# Patient Record
Sex: Female | Born: 1955
Health system: Southern US, Community
[De-identification: ages and names within clinical notes are randomized; demographics above are authoritative.]

## PROBLEM LIST (undated history)

## (undated) DIAGNOSIS — N390 Urinary tract infection, site not specified: Secondary | ICD-10-CM

## (undated) DIAGNOSIS — G039 Meningitis, unspecified: Secondary | ICD-10-CM

## (undated) DIAGNOSIS — F419 Anxiety disorder, unspecified: Secondary | ICD-10-CM

## (undated) DIAGNOSIS — M1990 Unspecified osteoarthritis, unspecified site: Secondary | ICD-10-CM

## (undated) DIAGNOSIS — F1910 Other psychoactive substance abuse, uncomplicated: Secondary | ICD-10-CM

## (undated) DIAGNOSIS — J189 Pneumonia, unspecified organism: Secondary | ICD-10-CM

## (undated) DIAGNOSIS — I10 Essential (primary) hypertension: Secondary | ICD-10-CM

## (undated) DIAGNOSIS — R569 Unspecified convulsions: Secondary | ICD-10-CM

## (undated) DIAGNOSIS — G4700 Insomnia, unspecified: Secondary | ICD-10-CM

## (undated) DIAGNOSIS — N189 Chronic kidney disease, unspecified: Secondary | ICD-10-CM

## (undated) DIAGNOSIS — K219 Gastro-esophageal reflux disease without esophagitis: Secondary | ICD-10-CM

## (undated) DIAGNOSIS — F32A Depression, unspecified: Secondary | ICD-10-CM

## (undated) DIAGNOSIS — R011 Cardiac murmur, unspecified: Secondary | ICD-10-CM

## (undated) DIAGNOSIS — F329 Major depressive disorder, single episode, unspecified: Secondary | ICD-10-CM

## (undated) HISTORY — PX: FRACTURE SURGERY: SHX138

## (undated) HISTORY — PX: SPINE SURGERY: SHX786

## (undated) HISTORY — PX: CERVICAL DISC SURGERY: SHX588

## (undated) HISTORY — PX: BACK SURGERY: SHX140

---

## 2018-04-08 NOTE — Congregational Nurse Program (Signed)
Counseling with [facility] staff

## 2018-04-08 NOTE — Congregational Nurse Program (Signed)
Client came for counseling services with [facility] staff.  States is interested in finding a provider that can also prescribe Methodone/saboxone.  Referred to [facility]

## 2018-04-16 NOTE — Congregational Nurse Program (Signed)
Client requesting assistance with locating a provider.  Client referred to [facility]

## 2018-05-13 ENCOUNTER — Other Ambulatory Visit: Payer: Self-pay | Admitting: Nurse Practitioner

## 2018-05-13 DIAGNOSIS — Z1231 Encounter for screening mammogram for malignant neoplasm of breast: Secondary | ICD-10-CM

## 2018-09-22 ENCOUNTER — Inpatient Hospital Stay (HOSPITAL_COMMUNITY)
Admission: EM | Admit: 2018-09-22 | Discharge: 2018-09-25 | DRG: 917 | Disposition: A | Discharge status: Home / Self Care | Payer: Medicare Other | Attending: Internal Medicine | Admitting: Internal Medicine

## 2018-09-22 ENCOUNTER — Other Ambulatory Visit: Payer: Self-pay

## 2018-09-22 ENCOUNTER — Encounter (HOSPITAL_COMMUNITY): Payer: Self-pay

## 2018-09-22 ENCOUNTER — Emergency Department (HOSPITAL_COMMUNITY): Payer: Medicare Other

## 2018-09-22 DIAGNOSIS — Y92009 Unspecified place in unspecified non-institutional (private) residence as the place of occurrence of the external cause: Secondary | ICD-10-CM

## 2018-09-22 DIAGNOSIS — T40601A Poisoning by unspecified narcotics, accidental (unintentional), initial encounter: Principal | ICD-10-CM | POA: Diagnosis present

## 2018-09-22 DIAGNOSIS — I10 Essential (primary) hypertension: Secondary | ICD-10-CM | POA: Diagnosis present

## 2018-09-22 DIAGNOSIS — F329 Major depressive disorder, single episode, unspecified: Secondary | ICD-10-CM | POA: Diagnosis present

## 2018-09-22 DIAGNOSIS — Z87891 Personal history of nicotine dependence: Secondary | ICD-10-CM

## 2018-09-22 DIAGNOSIS — Z79899 Other long term (current) drug therapy: Secondary | ICD-10-CM

## 2018-09-22 DIAGNOSIS — I951 Orthostatic hypotension: Secondary | ICD-10-CM

## 2018-09-22 DIAGNOSIS — G9341 Metabolic encephalopathy: Secondary | ICD-10-CM | POA: Diagnosis present

## 2018-09-22 DIAGNOSIS — F32A Depression, unspecified: Secondary | ICD-10-CM | POA: Diagnosis present

## 2018-09-22 DIAGNOSIS — R569 Unspecified convulsions: Secondary | ICD-10-CM | POA: Diagnosis present

## 2018-09-22 DIAGNOSIS — R339 Retention of urine, unspecified: Secondary | ICD-10-CM | POA: Diagnosis present

## 2018-09-22 DIAGNOSIS — G4700 Insomnia, unspecified: Secondary | ICD-10-CM | POA: Diagnosis present

## 2018-09-22 DIAGNOSIS — Z888 Allergy status to other drugs, medicaments and biological substances status: Secondary | ICD-10-CM

## 2018-09-22 DIAGNOSIS — R110 Nausea: Secondary | ICD-10-CM | POA: Diagnosis not present

## 2018-09-22 DIAGNOSIS — F419 Anxiety disorder, unspecified: Secondary | ICD-10-CM | POA: Diagnosis present

## 2018-09-22 DIAGNOSIS — E876 Hypokalemia: Secondary | ICD-10-CM | POA: Diagnosis not present

## 2018-09-22 DIAGNOSIS — Z880 Allergy status to penicillin: Secondary | ICD-10-CM

## 2018-09-22 DIAGNOSIS — G894 Chronic pain syndrome: Secondary | ICD-10-CM | POA: Diagnosis present

## 2018-09-22 HISTORY — DX: Insomnia, unspecified: G47.00

## 2018-09-22 HISTORY — DX: Unspecified convulsions: R56.9

## 2018-09-22 HISTORY — DX: Depression, unspecified: F32.A

## 2018-09-22 HISTORY — DX: Major depressive disorder, single episode, unspecified: F32.9

## 2018-09-22 HISTORY — DX: Anxiety disorder, unspecified: F41.9

## 2018-09-22 HISTORY — DX: Other psychoactive substance abuse, uncomplicated: F19.10

## 2018-09-22 HISTORY — DX: Meningitis, unspecified: G03.9

## 2018-09-22 HISTORY — DX: Essential (primary) hypertension: I10

## 2018-09-22 LAB — DIFFERENTIAL
Basophils Absolute: 0.1 10*3/uL (ref 0.0–0.1)
Basophils Relative: 1 %
Eosinophils Absolute: 0 10*3/uL (ref 0.0–0.5)
Eosinophils Relative: 0 %
Lymphocytes Relative: 14 %
Lymphs Abs: 1.3 10*3/uL (ref 0.7–4.0)
Monocytes Absolute: 0.9 10*3/uL (ref 0.1–1.0)
Monocytes Relative: 9 %
Neutro Abs: 7.4 10*3/uL (ref 1.7–7.7)
Neutrophils Relative %: 76 %

## 2018-09-22 LAB — BASIC METABOLIC PANEL
Anion gap: 8 (ref 5–15)
BUN: 13 mg/dL (ref 8–23)
CO2: 23 mmol/L (ref 22–32)
Calcium: 9.2 mg/dL (ref 8.9–10.3)
Chloride: 103 mmol/L (ref 98–111)
Creatinine, Ser: 0.87 mg/dL (ref 0.44–1.00)
GFR calc Af Amer: >60 mL/min (ref >60)
GFR calc non Af Amer: >60 mL/min (ref >60)
Glucose, Bld: 112 mg/dL — ABNORMAL HIGH (ref 70–99)
Potassium: 4.1 mmol/L (ref 3.5–5.1)
Sodium: 134 mmol/L — ABNORMAL LOW (ref 135–145)

## 2018-09-22 LAB — CBG MONITORING, ED: Glucose-Capillary: 108 mg/dL — ABNORMAL HIGH (ref 70–99)

## 2018-09-22 LAB — URINALYSIS, ROUTINE W REFLEX MICROSCOPIC
Bilirubin Urine: NEGATIVE
Glucose, UA: NEGATIVE mg/dL
Hgb urine dipstick: NEGATIVE
Ketones, ur: NEGATIVE mg/dL
Leukocytes,Ua: NEGATIVE
Nitrite: NEGATIVE
Protein, ur: NEGATIVE mg/dL
Specific Gravity, Urine: 1.01 (ref 1.005–1.030)
pH: 7 (ref 5.0–8.0)

## 2018-09-22 LAB — ETHANOL: Alcohol, Ethyl (B): <10 mg/dL (ref <10)

## 2018-09-22 LAB — RAPID URINE DRUG SCREEN, HOSP PERFORMED
Amphetamines: NOT DETECTED
BARBITURATES: NOT DETECTED
BENZODIAZEPINES: NOT DETECTED
Cocaine: NOT DETECTED
Opiates: POSITIVE — AB
Tetrahydrocannabinol: NOT DETECTED

## 2018-09-22 LAB — LACTIC ACID, PLASMA: Lactic Acid, Venous: 0.7 mmol/L (ref 0.5–1.9)

## 2018-09-22 LAB — CBC
HCT: 47.5 % — ABNORMAL HIGH (ref 36.0–46.0)
Hemoglobin: 15.5 g/dL — ABNORMAL HIGH (ref 12.0–15.0)
MCH: 29.2 pg (ref 26.0–34.0)
MCHC: 32.6 g/dL (ref 30.0–36.0)
MCV: 89.6 fL (ref 80.0–100.0)
Platelets: 284 10*3/uL (ref 150–400)
RBC: 5.3 MIL/uL — ABNORMAL HIGH (ref 3.87–5.11)
RDW: 12.4 % (ref 11.5–15.5)
WBC: 10.3 10*3/uL (ref 4.0–10.5)
nRBC: 0 % (ref 0.0–0.2)

## 2018-09-22 LAB — HEPATIC FUNCTION PANEL
ALT: 22 U/L (ref 0–44)
AST: 26 U/L (ref 15–41)
Albumin: 4.4 g/dL (ref 3.5–5.0)
Alkaline Phosphatase: 84 U/L (ref 38–126)
BILIRUBIN INDIRECT: 0.3 mg/dL (ref 0.3–0.9)
Bilirubin, Direct: 0.1 mg/dL (ref 0.0–0.2)
Total Bilirubin: 0.4 mg/dL (ref 0.3–1.2)
Total Protein: 8.2 g/dL — ABNORMAL HIGH (ref 6.5–8.1)

## 2018-09-22 LAB — TROPONIN I: Troponin I: <0.03 ng/mL (ref <0.03)

## 2018-09-22 IMAGING — CT CT HEAD W/O CM
3 series · 16 of 47 positions shown, 19 images · non-contrast
Comparison: None.

CLINICAL DATA: Generalized muscle weakness

EXAM:
CT HEAD WITHOUT CONTRAST
TECHNIQUE: Contiguous axial images were obtained from the base of the skull
through the vertex without intravenous contrast.

[Series 2: head wo · axial · 0.42mm/px · z∈[+1513,+1648]mm · 10 of 33 slices shown, 13 images]
[im 3/33  brain]
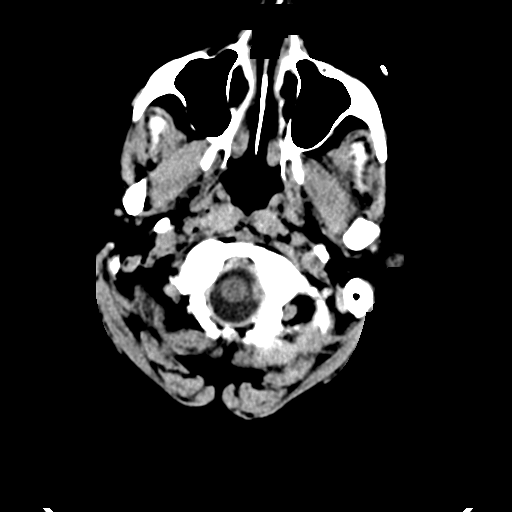
[im 3/33  bone]
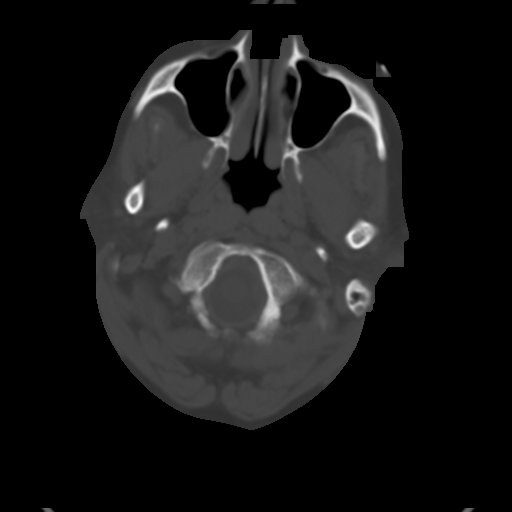
[im 6/33  brain]
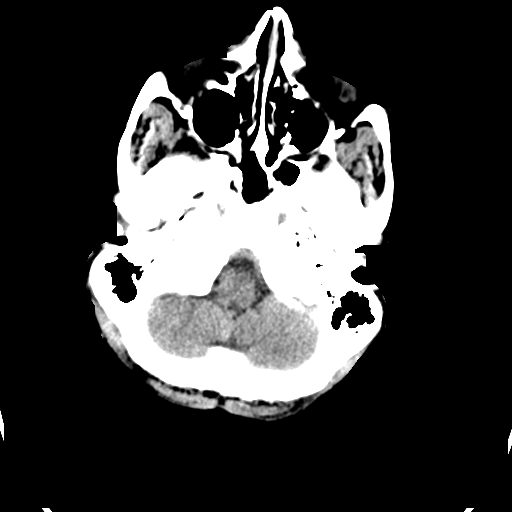
[im 9/33  brain]
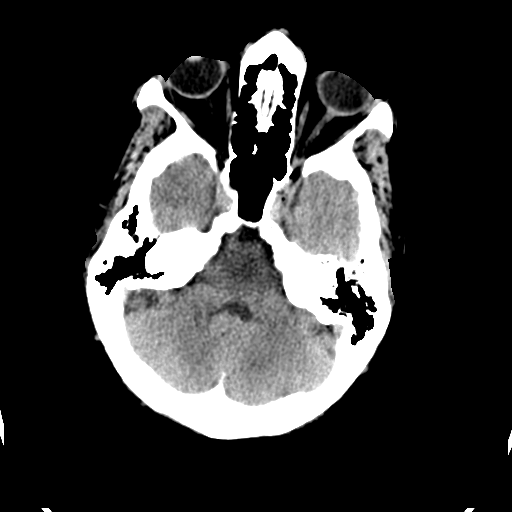
[im 12/33  brain]
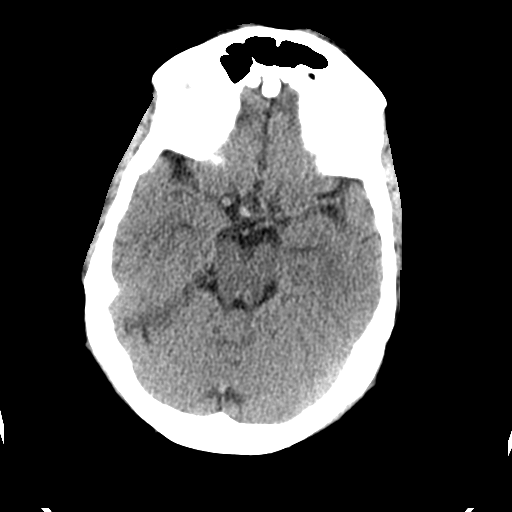
[im 15/33  brain]
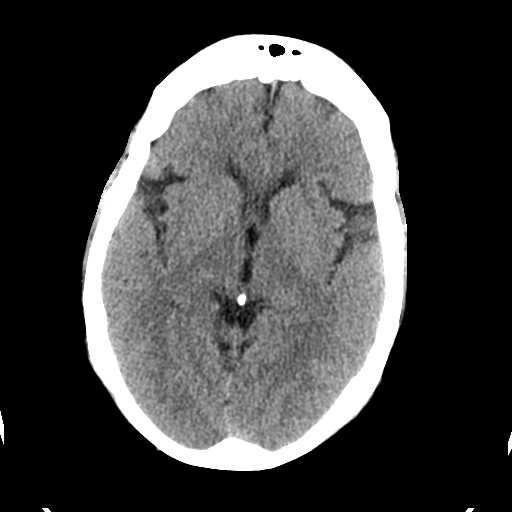
[im 15/33  bone]
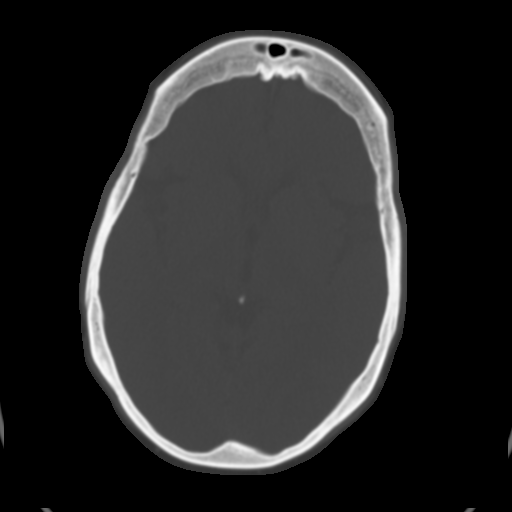
[im 18/33  brain]
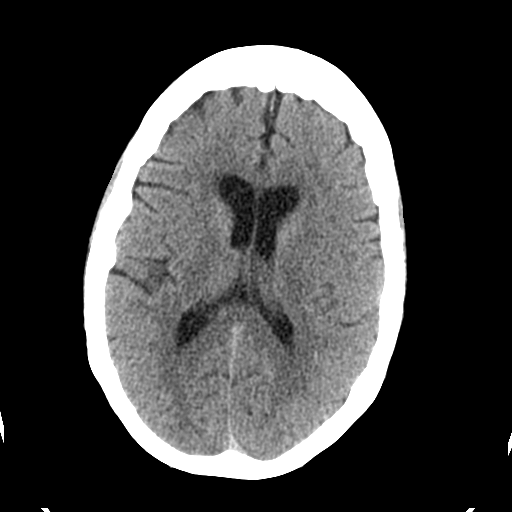
[im 21/33  brain]
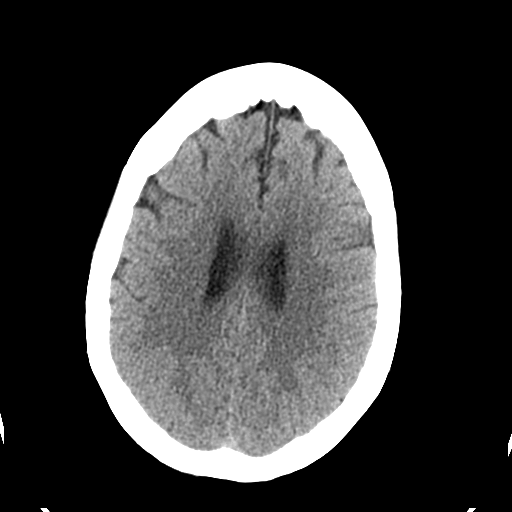
[im 25/33  brain]
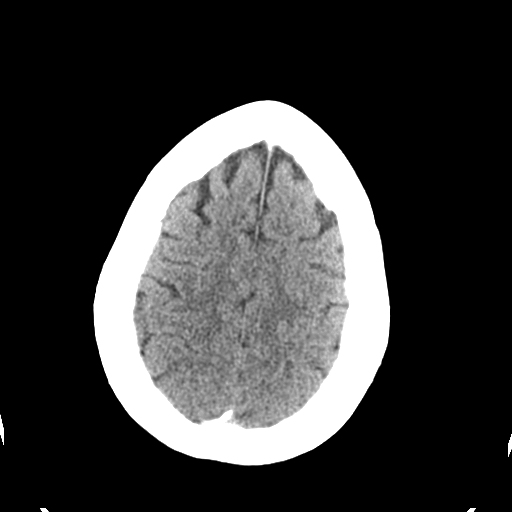
[im 27/33  brain]
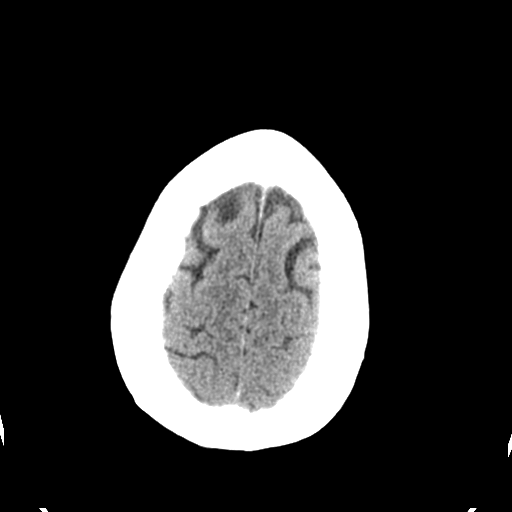
[im 27/33  bone]
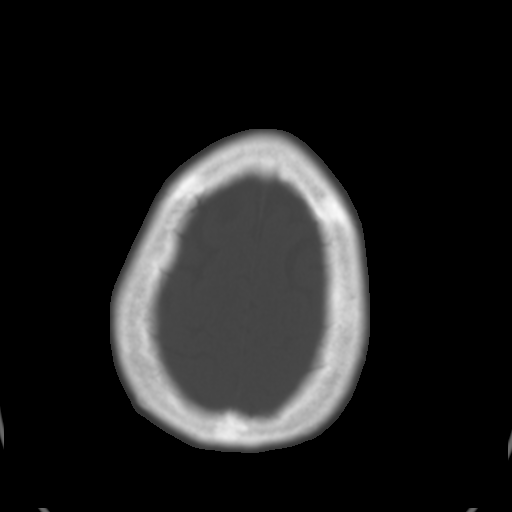
[im 30/33  brain]
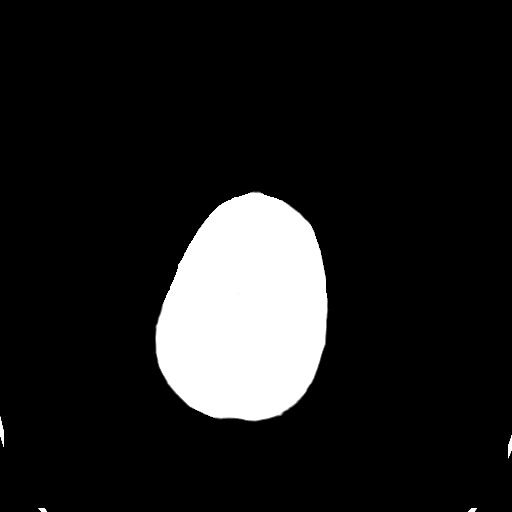

[Series 5: coronal soft tissue · coronal · 0.29mm/px · 3 of 66 slices shown]
[im 23/66  brain]
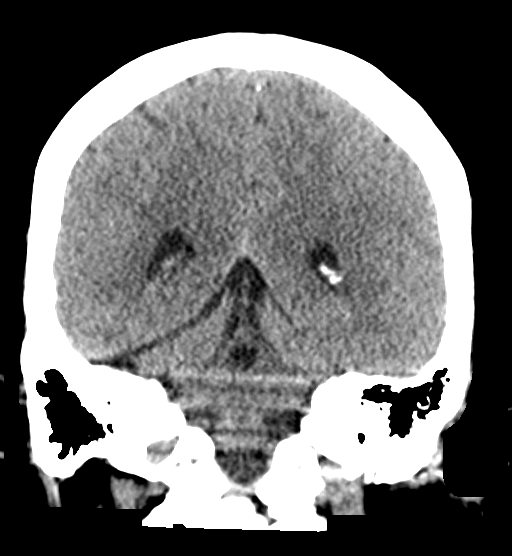
[im 30/66  brain]
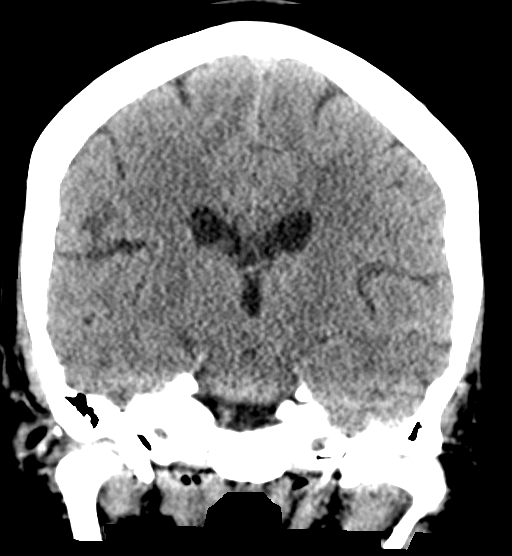
[im 36/66  brain]
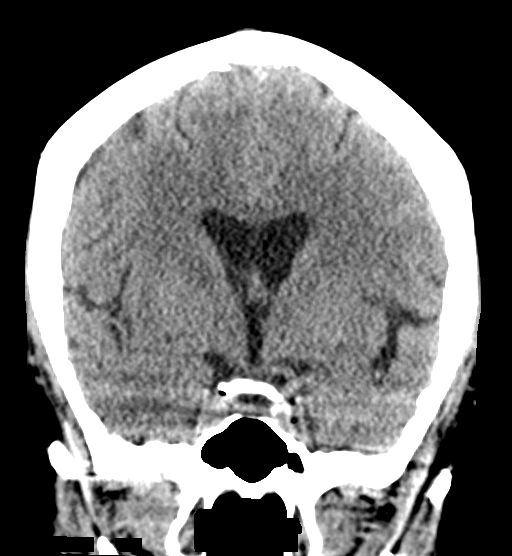

[Series 6: sagittal soft tissue · sagittal · 0.32mm/px · 3 of 51 slices shown]
[im 17/51  brain]
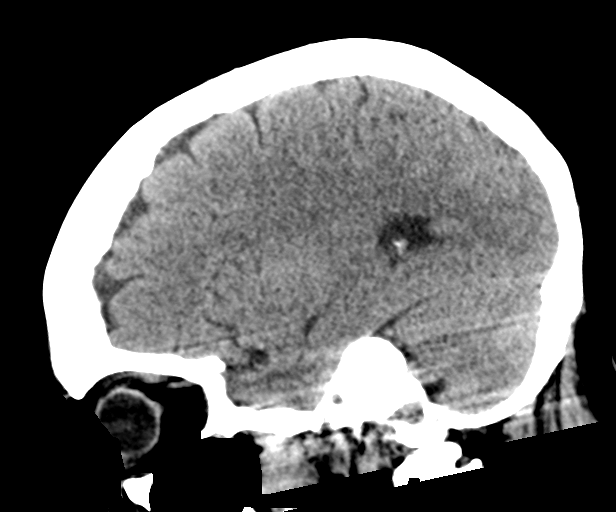
[im 26/51  brain]
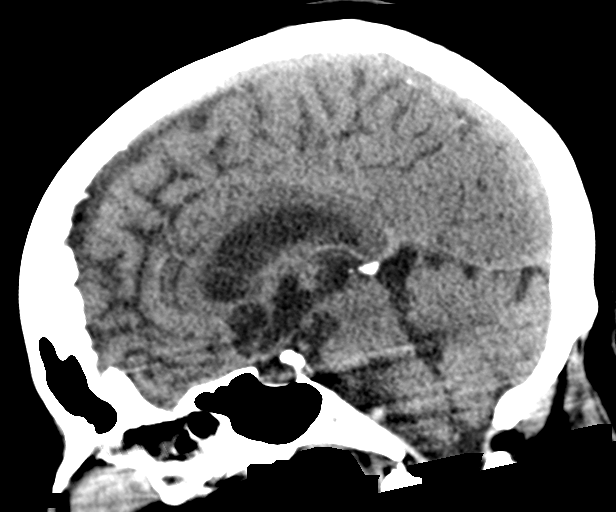
[im 34/51  brain]
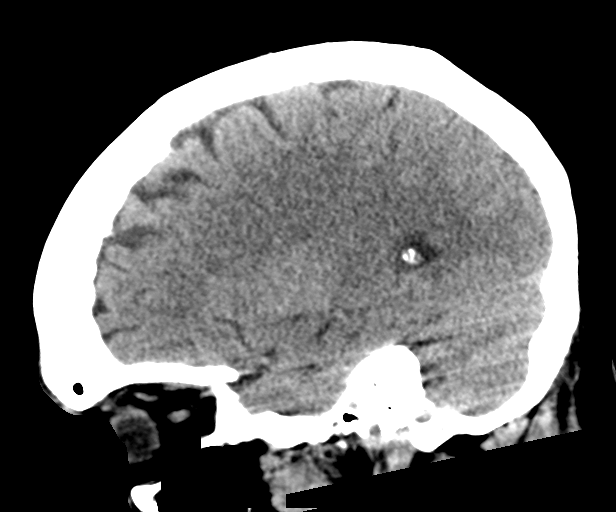

[16 of 47 positions shown; findings below may reference images not displayed]

FINDINGS: Brain: No acute territorial infarction, hemorrhage or intracranial
mass. The ventricles are nonenlarged. Minimal small vessel ischemic
changes of the white matter

Vascular: No hyperdense vessels. Tiny gas bubbles in the cavernous
sinus, which may be iatrogenic. Mild carotid vascular calcification.

Skull: Normal. Negative for fracture or focal lesion.

Sinuses/Orbits: No acute finding.

Other: None
IMPRESSION: 1. No CT evidence for acute intra-abdominal or pelvic abnormality.
2. Minimal small vessel ischemic change of the white matter

## 2018-09-22 IMAGING — CR DG CHEST 2V
2 series · 2 of 2 positions shown · non-contrast
Comparison: None.

CLINICAL DATA: Generalized weakness.  Unable to sleep.

EXAM:
CHEST - 2 VIEW

[w chest lat]
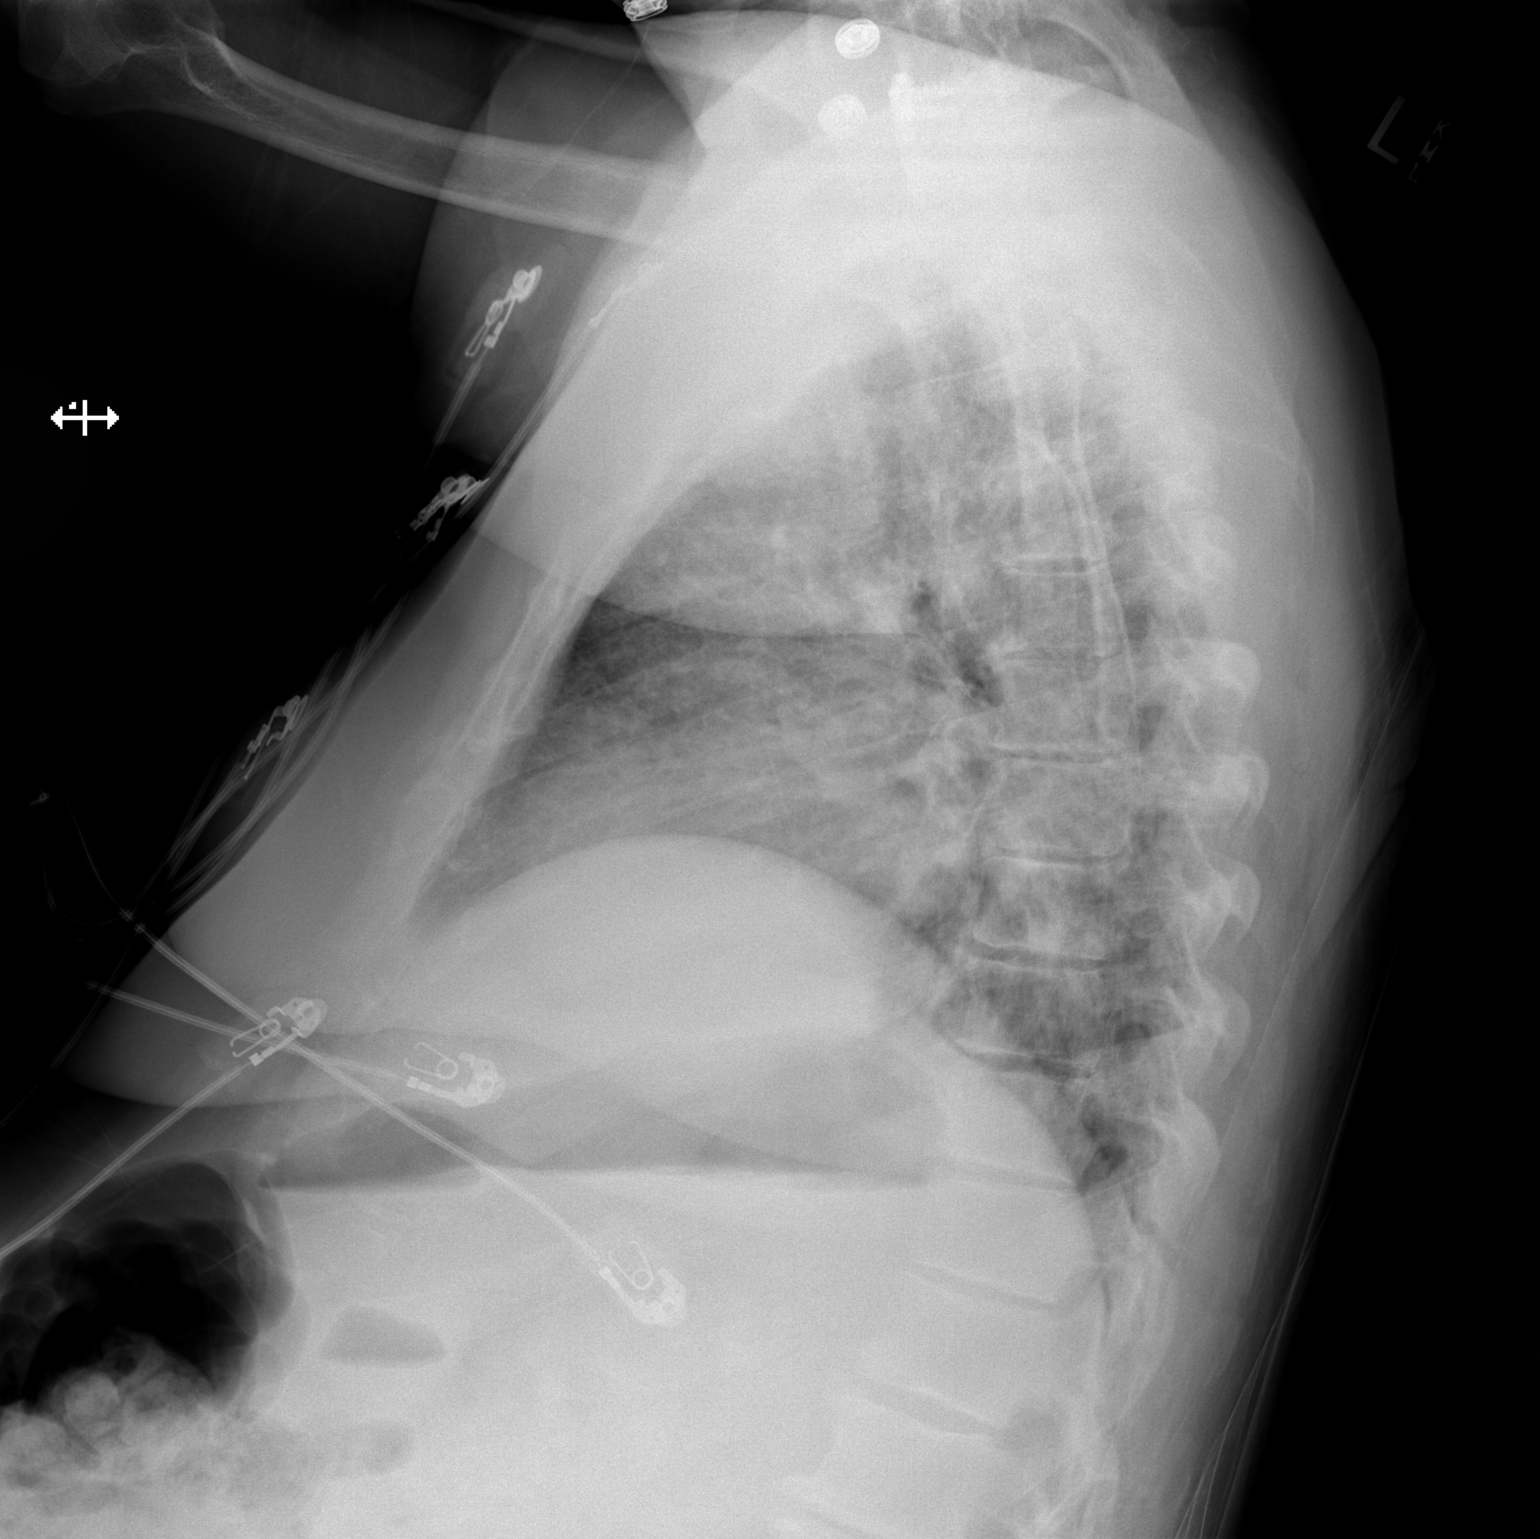

[x chest ap]
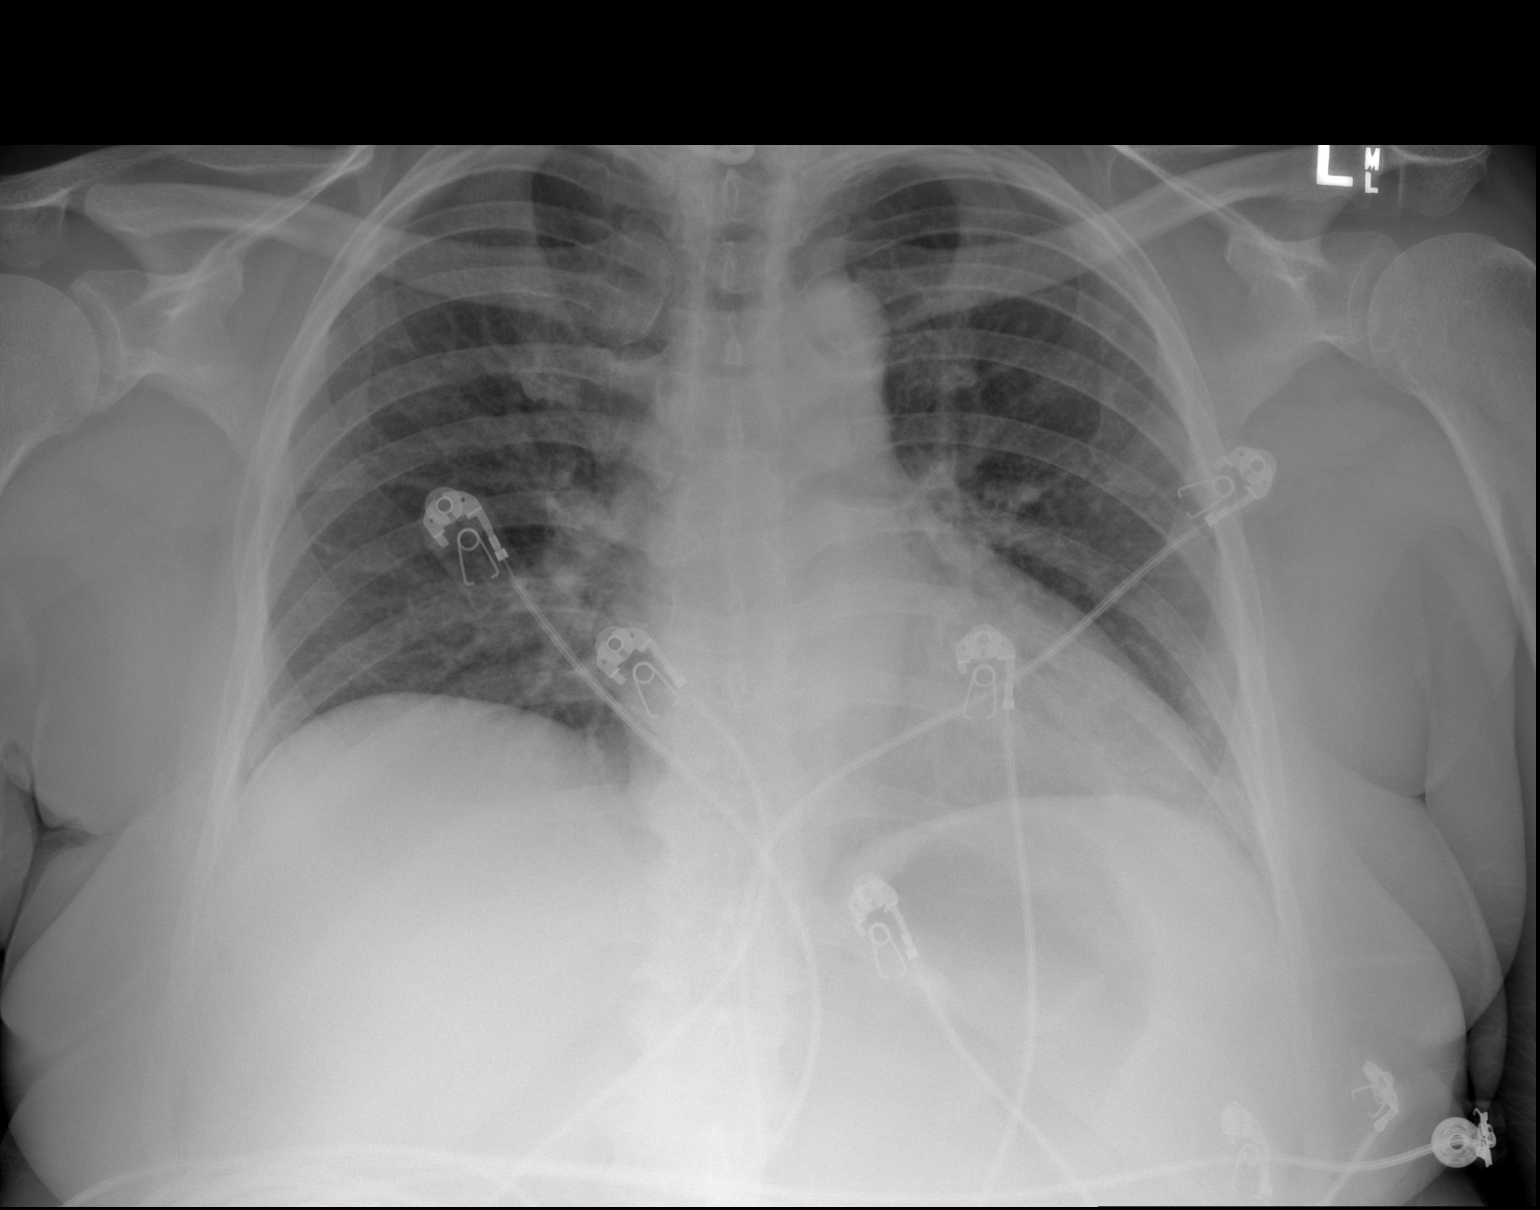

[2 of 2 positions shown; findings below may reference images not displayed]

FINDINGS: Heart size is exaggerated by low lung volumes. Mild pulmonary
vascular congestion is present. No focal airspace disease is
evident. The visualized soft tissues and bony thorax are
unremarkable.
IMPRESSION: Borderline cardiomegaly and mild pulmonary vascular congestion.

## 2018-09-22 MED ORDER — CLONAZEPAM 0.5 MG PO TABS
0.5000 mg | ORAL_TABLET | Freq: Two times a day (BID) | ORAL | Status: DC | PRN
  Administered 2018-09-24 – 2018-09-25 (×2): 0.5 mg via ORAL
  Filled 2018-09-22 (×3): qty 1

## 2018-09-22 MED ORDER — SODIUM CHLORIDE 0.9 % IV BOLUS
1000.0000 mL | Freq: Once | INTRAVENOUS | Status: AC
  Administered 2018-09-22: 1000 mL via INTRAVENOUS

## 2018-09-22 MED ORDER — SODIUM CHLORIDE 0.9% FLUSH
3.0000 mL | Freq: Once | INTRAVENOUS | Status: AC
  Administered 2018-09-22: 3 mL via INTRAVENOUS

## 2018-09-22 MED ORDER — ENOXAPARIN SODIUM 40 MG/0.4ML ~~LOC~~ SOLN
40.0000 mg | SUBCUTANEOUS | Status: DC
  Administered 2018-09-22 – 2018-09-24 (×3): 40 mg via SUBCUTANEOUS
  Filled 2018-09-22 (×4): qty 0.4

## 2018-09-22 MED ORDER — IBUPROFEN 200 MG PO TABS
200.0000 mg | ORAL_TABLET | Freq: Two times a day (BID) | ORAL | Status: DC | PRN
  Administered 2018-09-24: 200 mg via ORAL
  Filled 2018-09-22: qty 1

## 2018-09-22 MED ORDER — ACETAMINOPHEN 650 MG RE SUPP: 650.0000 mg | Freq: Four times a day (QID) | RECTAL | Status: DC | PRN

## 2018-09-22 MED ORDER — FLUOXETINE HCL 20 MG PO CAPS
40.0000 mg | ORAL_CAPSULE | Freq: Every day | ORAL | Status: DC
  Administered 2018-09-23 – 2018-09-25 (×3): 40 mg via ORAL
  Filled 2018-09-22 (×4): qty 2

## 2018-09-22 MED ORDER — GABAPENTIN 400 MG PO CAPS
800.0000 mg | ORAL_CAPSULE | Freq: Three times a day (TID) | ORAL | Status: DC
  Administered 2018-09-23 – 2018-09-25 (×7): 800 mg via ORAL
  Filled 2018-09-22 (×9): qty 2

## 2018-09-22 MED ORDER — TRAZODONE HCL 50 MG PO TABS
150.0000 mg | ORAL_TABLET | Freq: Every day | ORAL | Status: DC
  Administered 2018-09-22 – 2018-09-24 (×3): 150 mg via ORAL
  Filled 2018-09-22 (×3): qty 1

## 2018-09-22 MED ORDER — NALOXONE HCL 4 MG/10ML IJ SOLN
0.2500 mg/h | INTRAVENOUS | Status: DC
  Administered 2018-09-22: 0.25 mg/h via INTRAVENOUS
  Filled 2018-09-22: qty 10

## 2018-09-22 MED ORDER — ACETAMINOPHEN 325 MG PO TABS
650.0000 mg | ORAL_TABLET | Freq: Four times a day (QID) | ORAL | Status: DC | PRN
  Administered 2018-09-23 – 2018-09-25 (×3): 650 mg via ORAL
  Filled 2018-09-22 (×3): qty 2

## 2018-09-22 MED ORDER — ONDANSETRON HCL 4 MG PO TABS
4.0000 mg | ORAL_TABLET | Freq: Four times a day (QID) | ORAL | Status: DC | PRN
  Administered 2018-09-24 (×3): 4 mg via ORAL
  Filled 2018-09-22 (×3): qty 1

## 2018-09-22 MED ORDER — NALOXONE HCL 0.4 MG/ML IJ SOLN
0.4000 mg | INTRAMUSCULAR | Status: AC | PRN
  Administered 2018-09-22: 0.4 mg via INTRAVENOUS
  Filled 2018-09-22: qty 1

## 2018-09-22 MED ORDER — ONDANSETRON HCL 4 MG/2ML IJ SOLN
4.0000 mg | Freq: Four times a day (QID) | INTRAMUSCULAR | Status: DC | PRN
  Administered 2018-09-23 – 2018-09-25 (×2): 4 mg via INTRAVENOUS
  Filled 2018-09-22 (×3): qty 2

## 2018-09-22 NOTE — H&P (Signed)
History and Physical    Stephanie Carrillo ZMC:[id number] DOB: [birth year] DOA: 09/22/2018  PCP: Patient, No Pcp Per  Patient coming from: Home  I have personally briefly reviewed patient's old medical records in [facility]  Chief Complaint: AMS  HPI: Stephanie Carrillo is a 63 y.o. female with medical history significant of prior opiate substance abuse, anxiety, depression.  Patient brought in to ED after friend found her unresponsive and unable to wake up.  Friend tried doing CPR on the patient to no avail.  Seems that patient had been having trouble sleeping for past 2 weeks, developed "halucinations".  Was finally able to sleep last night after taking pain pills (we suspect a few too many).   ED Course: In the ED patient was initially apparently quite lethargic, until she was given narcan after which she woke up significantly.  Apparently narcan has had to be repeated per EDP.  Admit requested for narcan gtt.   Review of Systems: As per HPI otherwise 10 point review of systems negative.   Past Medical History:  Diagnosis Date  . Anxiety   . Depression   . Hypertension   . Insomnia   . Meningitis   . Seizures (HCC)   . Substance abuse [facility])     Past Surgical History:  Procedure Laterality Date  . BACK SURGERY    . CERVICAL DISC SURGERY       reports that she has quit smoking. She has never used smokeless tobacco. She reports previous alcohol use. She reports previous drug use.  Allergies  Allergen Reactions  . Ciprofloxacin Shortness Of Breath and Other (See Comments)    Respiratory arrest  . Penicillins Shortness Of Breath and Rash    Did it involve swelling of the face/tongue/throat, SOB, or low BP? y Did it involve sudden or severe rash/hives, skin peeling, or any reaction on the inside of your mouth or nose? y Did you need to seek medical attention at a hospital or doctor's office? n When did it last happen?1985 If all above answers are "NO", may proceed with  cephalosporin use.    Family History  Problem Relation Age of Onset  . Multiple sclerosis Mother   . Cancer Father      Prior to Admission medications   Medication Sig Start Date End Date Taking? Authorizing Provider  Buprenorphine HCl (BELBUCA) 900 MCG FILM Place 900 mcg inside cheek 2 (two) times daily.   Yes [provider]  cimetidine (TAGAMET) 200 MG tablet Take 200 mg by mouth daily as needed (upset stomach and indigestion).    Yes [provider]  clonazePAM (KLONOPIN) 0.5 MG tablet Take 0.5 mg by mouth 2 (two) times daily as needed for anxiety.   Yes [provider]  diphenhydrAMINE (BENADRYL) 25 MG tablet Take 25 mg by mouth at bedtime as needed for sleep.   Yes [provider]  FLUoxetine (PROZAC) 40 MG capsule Take 40 mg by mouth daily.   Yes [provider]  gabapentin (NEURONTIN) 800 MG tablet Take 800 mg by mouth 3 (three) times daily.   Yes [provider]  HYDROcodone-acetaminophen (NORCO/VICODIN) 5-325 MG tablet Take 1 tablet by mouth 2 (two) times daily as needed for moderate pain.   Yes [provider]  ibuprofen (ADVIL,MOTRIN) 100 MG tablet Take 200 mg by mouth 2 (two) times daily as needed for pain.   Yes [provider]  tiZANidine (ZANAFLEX) 4 MG capsule Take 4 mg by mouth 3 (three) times  daily.   Yes [provider]  traZODone (DESYREL) 150 MG tablet Take 150 mg by mouth at bedtime.   Yes [provider]    Physical Exam: Vitals:   09/22/18 1659 09/22/18 1706 09/22/18 1825  BP: 99/65  (!) 151/71  Pulse: (!) 55  (!) 49  Resp: 14  12  Temp: 98.2 F (36.8 C)    TempSrc: Oral    SpO2: 97%  95%  Weight:  79.4 kg   Height:  5\' 6"  (1.676 m)     Constitutional: NAD, calm, comfortable Eyes: PERRL, lids and conjunctivae normal ENMT: Mucous membranes are moist. Posterior pharynx clear of any exudate or lesions.Normal dentition.  Neck: normal, supple, no masses, no  thyromegaly Respiratory: clear to auscultation bilaterally, no wheezing, no crackles. Normal respiratory effort. No accessory muscle use.  Cardiovascular: Regular rate and rhythm, no murmurs / rubs / gallops. No extremity edema. 2+ pedal pulses. No carotid bruits.  Abdomen: no tenderness, no masses palpated. No hepatosplenomegaly. Bowel sounds positive.  Musculoskeletal: no clubbing / cyanosis. No joint deformity upper and lower extremities. Good ROM, no contractures. Normal muscle tone.  Skin: no rashes, lesions, ulcers. No induration Neurologic: CN 2-12 grossly intact. Sensation intact, DTR normal. Strength 5/5 in all 4.  Psychiatric: Normal judgment and insight. Alert and oriented x 3. Normal mood.    Labs on Admission: I have personally reviewed following labs and imaging studies  CBC: Recent Labs  Lab 09/22/18 1741  WBC 10.3  NEUTROABS 7.4  HGB 15.5*  HCT 47.5*  MCV 89.6  PLT 284   Basic Metabolic Panel: Recent Labs  Lab 09/22/18 1741  NA 134*  K 4.1  CL 103  CO2 23  GLUCOSE 112*  BUN 13  CREATININE 0.87  CALCIUM 9.2   GFR: Estimated Creatinine Clearance: 70.3 mL/min (by C-G formula based on SCr of 0.87 mg/dL). Liver Function Tests: Recent Labs  Lab 09/22/18 1759  AST 26  ALT 22  ALKPHOS 84  BILITOT 0.4  PROT 8.2*  ALBUMIN 4.4   No results for input(s): LIPASE, AMYLASE in the last 168 hours. No results for input(s): AMMONIA in the last 168 hours. Coagulation Profile: No results for input(s): INR, PROTIME in the last 168 hours. Cardiac Enzymes: Recent Labs  Lab 09/22/18 1759  TROPONINI <0.03   BNP (last 3 results) No results for input(s): PROBNP in the last 8760 hours. HbA1C: No results for input(s): HGBA1C in the last 72 hours. CBG: Recent Labs  Lab 09/22/18 1725  GLUCAP 108*   Lipid Profile: No results for input(s): CHOL, HDL, LDLCALC, TRIG, CHOLHDL, LDLDIRECT in the last 72 hours. Thyroid Function Tests: No results for input(s): TSH,  T4TOTAL, FREET4, T3FREE, THYROIDAB in the last 72 hours. Anemia Panel: No results for input(s): VITAMINB12, FOLATE, FERRITIN, TIBC, IRON, RETICCTPCT in the last 72 hours. Urine analysis:    Component Value Date/Time   COLORURINE YELLOW 09/22/2018 2009   APPEARANCEUR CLEAR 09/22/2018 2009   LABSPEC 1.010 09/22/2018 2009   PHURINE 7.0 09/22/2018 2009   GLUCOSEU NEGATIVE 09/22/2018 2009   HGBUR NEGATIVE 09/22/2018 2009   BILIRUBINUR NEGATIVE 09/22/2018 2009   KETONESUR NEGATIVE 09/22/2018 2009   PROTEINUR NEGATIVE 09/22/2018 2009   NITRITE NEGATIVE 09/22/2018 2009   LEUKOCYTESUR NEGATIVE 09/22/2018 2009    Radiological Exams on Admission: Dg Chest 2 View  Result Date: 09/22/2018 CLINICAL DATA:  Generalized weakness.  Unable to sleep. EXAM: CHEST - 2 VIEW COMPARISON:  None. FINDINGS: Heart size is exaggerated by  low lung volumes. Mild pulmonary vascular congestion is present. No focal airspace disease is evident. The visualized soft tissues and bony thorax are unremarkable. IMPRESSION: Borderline cardiomegaly and mild pulmonary vascular congestion. Electronically Signed   By: Marin Roberts M.D.   On: 09/22/2018 19:49   Ct Head Wo Contrast  Result Date: 09/22/2018 CLINICAL DATA:  Generalized muscle weakness EXAM: CT HEAD WITHOUT CONTRAST TECHNIQUE: Contiguous axial images were obtained from the base of the skull through the vertex without intravenous contrast. COMPARISON:  None. FINDINGS: Brain: No acute territorial infarction, hemorrhage or intracranial mass. The ventricles are nonenlarged. Minimal small vessel ischemic changes of the white matter Vascular: No hyperdense vessels. Tiny gas bubbles in the cavernous sinus, which may be iatrogenic. Mild carotid vascular calcification. Skull: Normal. Negative for fracture or focal lesion. Sinuses/Orbits: No acute finding. Other: None IMPRESSION: 1. No CT evidence for acute intra-abdominal or pelvic abnormality. 2. Minimal small vessel  ischemic change of the white matter Electronically Signed   By: Jasmine Pang M.D.   On: 09/22/2018 19:51    EKG: Independently reviewed.  Assessment/Plan Active Problems:   Opiate overdose (HCC)    1. Opiate overdose - accidental 1. Mental status improved on narcan and now patient alert (though the patient is stating that this is not an "improvement" at all, and she really would rather just be asleep). 2. Belbuca with extended half life, EDP requests admission for narcan gtt. 3. Will go ahead and start narcan gtt 4. Tele monitor, cont pulse ox, and ET CO2 monitoring 5. Hold belbuca, Hold norco, Hold gabapentin for tonight and resume tomorrow, resume PRN klonopin PRN tomorrow, holding for tonight 2. Anxiety / depression - 1. Continue prozac 2. Continue trazodone  DVT prophylaxis: Lovenox Code Status: Full Family Communication: Friend at bedside Disposition Plan: Home after admit Consults called: None Admission status: Place in 49   Ericka Marcellus M. DO [facility]  How to contact the [facility] Attending or Consulting provider 7A - 7P or covering provider during after hours 7P -7A, for this patient?  1. Check the care team in [facility] and look for a) attending/consulting [facility] provider listed and b) the [facility] team listed 2. Log into [email or web address]  Amion Physician Scheduling and messaging for groups and whole hospitals  On call and physician scheduling software for group practices, residents, hospitalists and other medical providers for call, clinic, rotation and shift schedules. On[employer] is a hospital-wide system for scheduling doctors and paging doctors on call. EasyPlot is for scientific plotting and data analysis.  [email or web address]  and use [place]'s universal password to access. If you do not have the password, please contact the hospital operator.  3. Locate the [facility] provider you are looking for under [facility] and page to a number that you can be directly  reached. 4. If you still have difficulty reaching the provider, please page the [facility] (Director on Call) for the Hospitalists listed on amion for assistance.  09/22/2018, 9:38 PM

## 2018-09-22 NOTE — ED Triage Notes (Addendum)
Per EMS-  Patient had multiple complaints. Patient states she has been having hallucinations, not sleeping for weeks, weakness. Patient denies any N/v/d, multiple falls, frequent urination. Patient states she went to her PCP and nothing was prescribed.  Patient states her friend came over and could not get her awake and the patient states she woke up when the friend was doing CPR on her bladder which caused her to pee and then she could not go back to sleep.  Patient drowsy and delayed speech. Patient admits that she took a pain med prior to EMS arrival and hydrocodone last night. Patient states she has not slept for days.

## 2018-09-22 NOTE — ED Notes (Signed)
2x attempts to start IV. Was able to place IV, unable to draw blood. US guided IV requested to be placed by ED nurse if available. IV team consult also placed.

## 2018-09-22 NOTE — ED Notes (Signed)
Pt able to void on bedpan. I&O cath unnecessary

## 2018-09-22 NOTE — ED Provider Notes (Signed)
[facility]-EMERGENCY DEPT Provider Note   CSN: [id number] Arrival date & time: 09/22/18  1641    History   Chief Complaint Chief Complaint  Patient presents with  . multiple complaints  . Hallucinations  . Weakness    HPI Kaylia Winborne is a 63 y.o. female.      Weakness    Pt was seen at 1800. Per pt, c/o gradual onset and persistence of constant generalized fatigue/weaknesss for the past 6 weeks, worse over the past 2 weeks. Pt's other symptoms include: not being able to sleep for the past 2 weeks and now having "hallucinations," multiple falls, and "trembling" extremities. Pt did admit that she slept last night after she took her narcotic pain pills, but her friend "did CPR on her bladder" because she "couldn't get her awake, which then woke her up. Pt states she was evaluated by her PMD for these complaints and "they didn't do anything" and she feels she "needs to have my potassium checked."  Pt took pain meds prior to EMS arrival to scene. Denies N/V/D, no abd pain, no CP/SOB, no cough, no focal motor weakness, no tingling/numbness in extremities, no fevers, no neck or back pain, no syncope/near syncope, no seizures.    Past Medical History:  Diagnosis Date  . Anxiety   . Depression   . Hypertension   . Insomnia   . Meningitis   . Seizures (HCC)   . Substance abuse (HCC)     There are no active problems to display for this patient.   Past Surgical History:  Procedure Laterality Date  . BACK SURGERY    . CERVICAL DISC SURGERY       OB History   No obstetric history on file.      Home Medications    Prior to Admission medications   Medication Sig Start Date End Date Taking? Authorizing Provider  Buprenorphine HCl (BELBUCA) 900 MCG FILM Place 900 mcg inside cheek 2 (two) times daily.   Yes [provider]  cimetidine (TAGAMET) 200 MG tablet Take 200 mg by mouth daily as needed (upset stomach and indigestion).    Yes [provider]  clonazePAM (KLONOPIN) 0.5 MG tablet Take 0.5 mg by mouth 2 (two) times daily as needed for anxiety.   Yes [provider]  diphenhydrAMINE (BENADRYL) 25 MG tablet Take 25 mg by mouth at bedtime as needed for sleep.   Yes [provider]  FLUoxetine (PROZAC) 40 MG capsule Take 40 mg by mouth daily.   Yes [provider]  gabapentin (NEURONTIN) 800 MG tablet Take 800 mg by mouth 3 (three) times daily.   Yes [provider]  HYDROcodone-acetaminophen (NORCO/VICODIN) 5-325 MG tablet Take 1 tablet by mouth 2 (two) times daily as needed for moderate pain.   Yes [provider]  ibuprofen (ADVIL,MOTRIN) 100 MG tablet Take 200 mg by mouth 2 (two) times daily as needed for pain.   Yes [provider]  tiZANidine (ZANAFLEX) 4 MG capsule Take 4 mg by mouth 3 (three) times daily.   Yes [provider]  traZODone (DESYREL) 150 MG tablet Take 150 mg by mouth at bedtime.   Yes [provider]    Family History Family History  Problem Relation Age of Onset  . Multiple sclerosis Mother   . Cancer Father     Social History Social History   Tobacco Use  . Smoking status: Former Games developer  . Smokeless tobacco: Never Used  Substance  Use Topics  . Alcohol use: Not Currently  . Drug use: Not Currently    Comment: opiates -clean 6 months     Allergies   Ciprofloxacin and Penicillins   Review of Systems Review of Systems  Neurological: Positive for weakness.   ROS: Statement: All systems negative except as marked or noted in the HPI; Constitutional: Negative for fever and chills. +generalized weakness/fatigue, "trembling," unable to sleep, hallucinating.; ; Eyes: Negative for eye pain, redness and discharge. ; ; ENMT: Negative for ear pain, hoarseness, nasal congestion, sinus pressure and sore throat. ; ; Cardiovascular: Negative for chest pain, palpitations, diaphoresis, dyspnea and peripheral edema. ; ; Respiratory:  Negative for cough, wheezing and stridor. ; ; Gastrointestinal: Negative for nausea, vomiting, diarrhea, abdominal pain, blood in stool, hematemesis, jaundice and rectal bleeding. . ; ; Genitourinary: Negative for dysuria, flank pain and hematuria. ; ; Musculoskeletal: Negative for back pain and neck pain. Negative for swelling and trauma.; ; Skin: Negative for pruritus, rash, abrasions, blisters, bruising and skin lesion.; ; Neuro: Negative for headache, lightheadedness and neck stiffness. Negative for altered level of consciousness, altered mental status, extremity weakness, paresthesias, involuntary movement, seizure and syncope.       Physical Exam Updated Vital Signs BP (!) 151/71   Pulse (!) 49   Temp 98.2 F (36.8 C) (Oral)   Resp 12   Ht 5\' 6"  (1.676 m)   Wt 79.4 kg   SpO2 95%   BMI 28.25 kg/m    20:43:14 Orthostatic Vital Signs KC  Orthostatic Lying   BP- Lying: 149/78  Pulse- Lying: 52      Orthostatic Sitting  BP- Sitting: 125/86  Pulse- Sitting: 58      Orthostatic Standing at 0 minutes  BP- Standing at 0 minutes: 90/68  Pulse- Standing at 0 minutes: 63     Physical Exam 1805: Physical examination:  Nursing notes reviewed; Vital signs and O2 SAT reviewed;  Constitutional: Well developed, Well nourished, Well hydrated, In no acute distress; Head:  Normocephalic, atraumatic; Eyes: EOMI, PERRL, No scleral icterus; ENMT: Mouth and pharynx normal, Mucous membranes moist; Neck: Supple, Full range of motion, No lymphadenopathy; Cardiovascular: Regular rate and rhythm, No gallop; Respiratory: Breath sounds clear & equal bilaterally, No wheezes.  Speaking full sentences with ease, Normal respiratory effort/excursion; Chest: Nontender, Movement normal; Abdomen: Soft, Nontender, Nondistended, Normal bowel sounds; Genitourinary: No CVA tenderness; Extremities: Peripheral pulses normal, No tenderness, No edema, No calf edema or asymmetry.; Neuro: Drowsy, but AA&Ox3. Speech  slightly slurred. No facial droop. Moves all extremities spontaneously and to command without apparent gross focal motor or sensory deficits in extremities.; Skin: Color normal, Warm, Dry.     ED Treatments / Results  Labs (all labs ordered are listed, but only abnormal results are displayed)   EKG EKG Interpretation  Date/Time:  Wednesday September 22 2018 17:19:33 EST Ventricular Rate:  56 PR Interval:    QRS Duration: 80 QT Interval:  451 QTC Calculation: 436 R Axis:   22 Text Interpretation:  Sinus rhythm Low voltage, precordial leads No old tracing to compare Confirmed by Linwood DibblesKnapp, Jon [phone]([id number]) on 09/22/2018 5:22:24 PM   Radiology   Procedures Procedures (including critical care time)  Medications Ordered in ED Medications  sodium chloride 0.9 % bolus 1,000 mL (1,000 mLs Intravenous New Bag/Given 09/22/18 1831)  naloxone [facility](NARCAN) injection 0.4 mg (has no administration in time range)  sodium chloride flush (NS) 0.9 % injection 3 mL (3 mLs Intravenous Given 09/22/18 1823)  Initial Impression / Assessment and Plan / ED Course  I have reviewed the triage vital signs and the nursing notes.  Pertinent labs & imaging results that were available during my care of the patient were reviewed by me and considered in my medical decision making (see chart for details).     MDM Reviewed: previous chart, nursing note and vitals Reviewed previous: labs and ECG Interpretation: labs, ECG, x-ray and CT scan Total time providing critical care: 30-74 minutes. This excludes time spent performing separately reportable procedures and services. Consults: admitting MD   CRITICAL CARE Performed by: Samuel Jester Total critical care time: 35 minutes Critical care time was exclusive of separately billable procedures and treating other patients. Critical care was necessary to treat or prevent imminent or life-threatening deterioration. Critical care was time spent personally by me on  the following activities: development of treatment plan with patient and/or surrogate as well as nursing, discussions with consultants, evaluation of patient's response to treatment, examination of patient, obtaining history from patient or surrogate, ordering and performing treatments and interventions, ordering and review of laboratory studies, ordering and review of radiographic studies, pulse oximetry and re-evaluation of patient's condition.    Results for orders placed or performed during the hospital encounter of 09/22/18  Basic metabolic panel  Result Value Ref Range   Sodium 134 (L) 135 - 145 mmol/L   Potassium 4.1 3.5 - 5.1 mmol/L   Chloride 103 98 - 111 mmol/L   CO2 23 22 - 32 mmol/L   Glucose, Bld 112 (H) 70 - 99 mg/dL   BUN 13 8 - 23 mg/dL   Creatinine, Ser 7.93 0.44 - 1.00 mg/dL   Calcium 9.2 8.9 - 90.3 mg/dL   GFR calc non Af Amer >60 >60 mL/min   GFR calc Af Amer >60 >60 mL/min   Anion gap 8 5 - 15  CBC  Result Value Ref Range   WBC 10.3 4.0 - 10.5 K/uL   RBC 5.30 (H) 3.87 - 5.11 MIL/uL   Hemoglobin 15.5 (H) 12.0 - 15.0 g/dL   HCT 00.9 (H) 23.3 - 00.7 %   MCV 89.6 80.0 - 100.0 fL   MCH 29.2 26.0 - 34.0 pg   MCHC 32.6 30.0 - 36.0 g/dL   RDW 62.2 63.3 - 35.4 %   Platelets 284 150 - 400 K/uL   nRBC 0.0 0.0 - 0.2 %  Urinalysis, Routine w reflex microscopic  Result Value Ref Range   Color, Urine YELLOW YELLOW   APPearance CLEAR CLEAR   Specific Gravity, Urine 1.010 1.005 - 1.030   pH 7.0 5.0 - 8.0   Glucose, UA NEGATIVE NEGATIVE mg/dL   Hgb urine dipstick NEGATIVE NEGATIVE   Bilirubin Urine NEGATIVE NEGATIVE   Ketones, ur NEGATIVE NEGATIVE mg/dL   Protein, ur NEGATIVE NEGATIVE mg/dL   Nitrite NEGATIVE NEGATIVE   Leukocytes,Ua NEGATIVE NEGATIVE  Urine rapid drug screen (hosp performed)  Result Value Ref Range   Opiates POSITIVE (A) NONE DETECTED   Cocaine NONE DETECTED NONE DETECTED   Benzodiazepines NONE DETECTED NONE DETECTED   Amphetamines NONE DETECTED  NONE DETECTED   Tetrahydrocannabinol NONE DETECTED NONE DETECTED   Barbiturates NONE DETECTED NONE DETECTED  Hepatic function panel  Result Value Ref Range   Total Protein 8.2 (H) 6.5 - 8.1 g/dL   Albumin 4.4 3.5 - 5.0 g/dL   AST 26 15 - 41 U/L   ALT 22 0 - 44 U/L   Alkaline Phosphatase 84 38 - 126 U/L  Total Bilirubin 0.4 0.3 - 1.2 mg/dL   Bilirubin, Direct 0.1 0.0 - 0.2 mg/dL   Indirect Bilirubin 0.3 0.3 - 0.9 mg/dL  Troponin I - Once  Result Value Ref Range   Troponin I <0.03 <0.03 ng/mL  Lactic acid, plasma  Result Value Ref Range   Lactic Acid, Venous 0.7 0.5 - 1.9 mmol/L  Ethanol  Result Value Ref Range   Alcohol, Ethyl (B) <10 <10 mg/dL  Differential  Result Value Ref Range   Neutrophils Relative % 76 %   Neutro Abs 7.4 1.7 - 7.7 K/uL   Lymphocytes Relative 14 %   Lymphs Abs 1.3 0.7 - 4.0 K/uL   Monocytes Relative 9 %   Monocytes Absolute 0.9 0.1 - 1.0 K/uL   Eosinophils Relative 0 %   Eosinophils Absolute 0.0 0.0 - 0.5 K/uL   Basophils Relative 1 %   Basophils Absolute 0.1 0.0 - 0.1 K/uL  CBG monitoring, ED  Result Value Ref Range   Glucose-Capillary 108 (H) 70 - 99 mg/dL   Comment 1 Notify RN    Comment 2 Document in Chart    Dg Chest 2 View Result Date: 09/22/2018 CLINICAL DATA:  Generalized weakness.  Unable to sleep. EXAM: CHEST - 2 VIEW COMPARISON:  None. FINDINGS: Heart size is exaggerated by low lung volumes. Mild pulmonary vascular congestion is present. No focal airspace disease is evident. The visualized soft tissues and bony thorax are unremarkable. IMPRESSION: Borderline cardiomegaly and mild pulmonary vascular congestion. Electronically Signed   By: Marin Roberts M.D.   On: 09/22/2018 19:49   Ct Head Wo Contrast Result Date: 09/22/2018 CLINICAL DATA:  Generalized muscle weakness EXAM: CT HEAD WITHOUT CONTRAST TECHNIQUE: Contiguous axial images were obtained from the base of the skull through the vertex without intravenous contrast. COMPARISON:   None. FINDINGS: Brain: No acute territorial infarction, hemorrhage or intracranial mass. The ventricles are nonenlarged. Minimal small vessel ischemic changes of the white matter Vascular: No hyperdense vessels. Tiny gas bubbles in the cavernous sinus, which may be iatrogenic. Mild carotid vascular calcification. Skull: Normal. Negative for fracture or focal lesion. Sinuses/Orbits: No acute finding. Other: None IMPRESSION: 1. No CT evidence for acute intra-abdominal or pelvic abnormality. 2. Minimal small vessel ischemic change of the white matter Electronically Signed   By: Jasmine Pang M.D.   On: 09/22/2018 19:51    1900:  Pt bradycardic, lethargic; IV narcan given with good effect.   2120:   Pt's symptoms likely d/t opiate OD/polypharmacy.  Pt is orthostatic on VS despite IVF. T/C returned from [facility]. Julian Reil, case discussed, including:  HPI, pertinent PM/SHx, VS/PE, dx testing, ED course and treatment:  Agreeable to admit.     Final Clinical Impressions(s) / ED Diagnoses   Final diagnoses:  None    ED Discharge Orders    None       Samuel Jester, DO 09/24/18 0277

## 2018-09-22 NOTE — ED Notes (Signed)
Narcan chosen to be administered after discussing pts HR with provider.

## 2018-09-22 NOTE — ED Notes (Signed)
Main lab called to stick pt as they are a difficult stick.

## 2018-09-23 DIAGNOSIS — R110 Nausea: Secondary | ICD-10-CM | POA: Diagnosis not present

## 2018-09-23 DIAGNOSIS — G9341 Metabolic encephalopathy: Secondary | ICD-10-CM | POA: Diagnosis present

## 2018-09-23 DIAGNOSIS — I951 Orthostatic hypotension: Secondary | ICD-10-CM | POA: Diagnosis present

## 2018-09-23 DIAGNOSIS — E876 Hypokalemia: Secondary | ICD-10-CM | POA: Diagnosis not present

## 2018-09-23 DIAGNOSIS — R569 Unspecified convulsions: Secondary | ICD-10-CM | POA: Diagnosis present

## 2018-09-23 DIAGNOSIS — F329 Major depressive disorder, single episode, unspecified: Secondary | ICD-10-CM | POA: Diagnosis present

## 2018-09-23 DIAGNOSIS — G4700 Insomnia, unspecified: Secondary | ICD-10-CM | POA: Diagnosis present

## 2018-09-23 DIAGNOSIS — Z888 Allergy status to other drugs, medicaments and biological substances status: Secondary | ICD-10-CM | POA: Diagnosis not present

## 2018-09-23 DIAGNOSIS — F419 Anxiety disorder, unspecified: Secondary | ICD-10-CM | POA: Diagnosis present

## 2018-09-23 DIAGNOSIS — Z87891 Personal history of nicotine dependence: Secondary | ICD-10-CM | POA: Diagnosis not present

## 2018-09-23 DIAGNOSIS — F32A Depression, unspecified: Secondary | ICD-10-CM | POA: Diagnosis present

## 2018-09-23 DIAGNOSIS — G894 Chronic pain syndrome: Secondary | ICD-10-CM | POA: Diagnosis present

## 2018-09-23 DIAGNOSIS — Y92009 Unspecified place in unspecified non-institutional (private) residence as the place of occurrence of the external cause: Secondary | ICD-10-CM | POA: Diagnosis not present

## 2018-09-23 DIAGNOSIS — T40601A Poisoning by unspecified narcotics, accidental (unintentional), initial encounter: Secondary | ICD-10-CM | POA: Diagnosis present

## 2018-09-23 DIAGNOSIS — I10 Essential (primary) hypertension: Secondary | ICD-10-CM | POA: Diagnosis present

## 2018-09-23 DIAGNOSIS — Z880 Allergy status to penicillin: Secondary | ICD-10-CM | POA: Diagnosis not present

## 2018-09-23 DIAGNOSIS — R339 Retention of urine, unspecified: Secondary | ICD-10-CM | POA: Diagnosis present

## 2018-09-23 DIAGNOSIS — Z79899 Other long term (current) drug therapy: Secondary | ICD-10-CM | POA: Diagnosis not present

## 2018-09-23 LAB — CBG MONITORING, ED: Glucose-Capillary: 123 mg/dL — ABNORMAL HIGH (ref 70–99)

## 2018-09-23 MED ORDER — HYDROCODONE-ACETAMINOPHEN 5-325 MG PO TABS
1.0000 | ORAL_TABLET | Freq: Two times a day (BID) | ORAL | Status: DC | PRN
  Administered 2018-09-23 – 2018-09-24 (×2): 1 via ORAL
  Filled 2018-09-23 (×3): qty 1

## 2018-09-23 MED ORDER — HYDRALAZINE HCL 20 MG/ML IJ SOLN
10.0000 mg | Freq: Four times a day (QID) | INTRAMUSCULAR | Status: DC | PRN
  Filled 2018-09-23: qty 1

## 2018-09-23 MED ORDER — NALOXONE HCL 0.4 MG/ML IJ SOLN: 0.4000 mg | INTRAMUSCULAR | Status: DC | PRN

## 2018-09-23 NOTE — Progress Notes (Addendum)
PROGRESS NOTE    Stephanie Carrillo  OEU:[id number] DOB: 1955/11/12 DOA: 09/22/2018 PCP: Patient, No Pcp Per   Brief Narrative:  Stephanie Carrillo is a 63 y.o. female with medical history significant of prior opiate substance abuse, anxiety, depression. Patient brought in to ED after friend found her unresponsive and unable to wake up.  Friend tried doing CPR on the patient to no avail. Seems that patient had been having trouble sleeping for past 2 weeks, developed "halucinations".  Was finally able to sleep last night after taking pain pills (we suspect a few too many).  ED Course: In the ED patient was initially apparently quite lethargic, until she was given narcan after which she woke up significantly.  Apparently narcan has had to be repeated per EDP. Admit requested for narcan gtt.   Assessment & Plan:   Principal Problem:   Opiate overdose (HCC)   Acute metabolic encephalopathy Suspected opioid overdose - accidental Patient presenting to ED via EMS after being found altered by family/friends likely secondary to accidental opioid overdose.  Patient initially responded well to IV Narcan but subsequently became somnolent and such was started on a Narcan drip.  Patient's with history of chronic pain syndrome on Belbuca and Vicodin at home.  Belbuca has a half-life of 27.6 hours which is likely the factor contributing to her encephalopathy.  Urinalysis unremarkable.  Patient is afebrile without leukocytosis.  UDS positive for opiates.  Chest x-ray with cardiomegaly that is borderline and mild pulmonary vascular congestion.  CT head negative for acute disease process.  EtOH level less than 10 and lactic acid normal at 0.7. --Continue Narcan drip, will titrate down to 0.1 mg/hr --Continue to monitor respiratory status closely --Continue to monitor on telemetry --Continue to hold home narcotics  Anxiety/depression --Continue Prozac 40 mg p.o. daily and clonazepam 0.5 mg p.o. twice  daily --Continue trazodone 150 mg p.o. nightly  Chronic pain syndrome --Holding home Belbuca 900 mcg PO BID and Vicodin 5-325 mg p.o. twice daily --Continue home gabapentin 800 mg p.o. 3 times daily  Acute urinary retention Patient unable to void, bladder scan noted greater than 834mL's of retained urine; status post in and out cath.  Later this afternoon patient continues unable to void, repeat bladder scan showing greater than 500 mL's of retained urine. --Foley catheter placed --Will attempt voiding trial once discontinued from Narcan drip and able to ambulate, hopefully tomorrow   DVT prophylaxis: Lovenox Code Status: Full code Family Communication: None Disposition Plan: Continue inpatient hospitalization secondary to requirement of Narcan drip   Consultants: None  Procedures: None  Antimicrobials: None   Subjective: Patient seen and examined at bedside, continues in the ED holding area awaiting bed placement.  Was found altered yesterday by family/friends.  Apparently has been unable to sleep over the past few weeks and apparently took some extra pain medications.  Was given Narcan IV in the ED with need of several recurrences, so started on Narcan drip.  Continues with some anxiety and urinary retention this morning status post in and out cath and now Foley catheter placed.  No other complaints at this time.  Denies headache, no visual changes, no chest pain, no palpitations, no shortness of breath, no abdominal pain, no fever/chills/night sweats.  No other concerns overnight per nursing staff.  Objective: Vitals:   09/23/18 1130 09/23/18 1200 09/23/18 1230 09/23/18 1327  BP: (!) 153/89 137/78 (!) 135/111 (!) 150/94  Pulse: 86 74 78 76  Resp: 16 16 16 16   Temp:  TempSrc:      SpO2: 97% 98% 98% 99%  Weight:      Height:        Intake/Output Summary (Last 24 hours) at 09/23/2018 1345 Last data filed at 09/22/2018 2214 Gross per 24 hour  Intake 2003 ml  Output -   Net 2003 ml   Filed Weights   09/22/18 1706  Weight: 79.4 kg    Examination:  General exam: Slightly anxious on exam Respiratory system: Clear to auscultation. Respiratory effort normal. Cardiovascular system: S1 & S2 heard, RRR. No JVD, murmurs, rubs, gallops or clicks. No pedal edema. Gastrointestinal system: Abdomen is nondistended, soft and nontender. No organomegaly or masses felt. Normal bowel sounds heard. Central nervous system: Alert and oriented. No focal neurological deficits. Extremities: Symmetric 5 x 5 power. Skin: No rashes, lesions or ulcers Psychiatry: Judgement and insight appear normal. Mood & affect appropriate.     Data Reviewed: I have personally reviewed following labs and imaging studies  CBC: Recent Labs  Lab 09/22/18 1741  WBC 10.3  NEUTROABS 7.4  HGB 15.5*  HCT 47.5*  MCV 89.6  PLT 284   Basic Metabolic Panel: Recent Labs  Lab 09/22/18 1741  NA 134*  K 4.1  CL 103  CO2 23  GLUCOSE 112*  BUN 13  CREATININE 0.87  CALCIUM 9.2   GFR: Estimated Creatinine Clearance: 70.3 mL/min (by C-G formula based on SCr of 0.87 mg/dL). Liver Function Tests: Recent Labs  Lab 09/22/18 1759  AST 26  ALT 22  ALKPHOS 84  BILITOT 0.4  PROT 8.2*  ALBUMIN 4.4   No results for input(s): LIPASE, AMYLASE in the last 168 hours. No results for input(s): AMMONIA in the last 168 hours. Coagulation Profile: No results for input(s): INR, PROTIME in the last 168 hours. Cardiac Enzymes: Recent Labs  Lab 09/22/18 1759  TROPONINI <0.03   BNP (last 3 results) No results for input(s): PROBNP in the last 8760 hours. HbA1C: No results for input(s): HGBA1C in the last 72 hours. CBG: Recent Labs  Lab 09/22/18 1725  GLUCAP 108*   Lipid Profile: No results for input(s): CHOL, HDL, LDLCALC, TRIG, CHOLHDL, LDLDIRECT in the last 72 hours. Thyroid Function Tests: No results for input(s): TSH, T4TOTAL, FREET4, T3FREE, THYROIDAB in the last 72 hours. Anemia  Panel: No results for input(s): VITAMINB12, FOLATE, FERRITIN, TIBC, IRON, RETICCTPCT in the last 72 hours. Sepsis Labs: Recent Labs  Lab 09/22/18 2109  LATICACIDVEN 0.7    No results found for this or any previous visit (from the past 240 hour(s)).       Radiology Studies: Dg Chest 2 View  Result Date: 09/22/2018 CLINICAL DATA:  Generalized weakness.  Unable to sleep. EXAM: CHEST - 2 VIEW COMPARISON:  None. FINDINGS: Heart size is exaggerated by low lung volumes. Mild pulmonary vascular congestion is present. No focal airspace disease is evident. The visualized soft tissues and bony thorax are unremarkable. IMPRESSION: Borderline cardiomegaly and mild pulmonary vascular congestion. Electronically Signed   By: Marin Roberts M.D.   On: 09/22/2018 19:49   Ct Head Wo Contrast  Result Date: 09/22/2018 CLINICAL DATA:  Generalized muscle weakness EXAM: CT HEAD WITHOUT CONTRAST TECHNIQUE: Contiguous axial images were obtained from the base of the skull through the vertex without intravenous contrast. COMPARISON:  None. FINDINGS: Brain: No acute territorial infarction, hemorrhage or intracranial mass. The ventricles are nonenlarged. Minimal small vessel ischemic changes of the white matter Vascular: No hyperdense vessels. Tiny gas bubbles in the cavernous sinus,  which may be iatrogenic. Mild carotid vascular calcification. Skull: Normal. Negative for fracture or focal lesion. Sinuses/Orbits: No acute finding. Other: None IMPRESSION: 1. No CT evidence for acute intra-abdominal or pelvic abnormality. 2. Minimal small vessel ischemic change of the white matter Electronically Signed   By: Jasmine PangKim  Fujinaga M.D.   On: 09/22/2018 19:51        Scheduled Meds: . enoxaparin (LOVENOX) injection  40 mg Subcutaneous Q24H  . FLUoxetine  40 mg Oral Daily  . gabapentin  800 mg Oral TID  . traZODone  150 mg Oral QHS   Continuous Infusions: . naLOXone (NARCAN) adult infusion for OVERDOSE 0.1 mg/hr  (09/23/18 1135)     LOS: 0 days    Time spent: 36 minutes    Alvira PhilipsEric J [place], DO [facility] Pager [phone]  If 7PM-7AM, please contact night-coverage [email or web address] Password [email or web address] 09/23/2018, 1:45 PM

## 2018-09-23 NOTE — ED Notes (Signed)
Per Molly Maduro baldder scan result 

## 2018-09-23 NOTE — ED Notes (Signed)
Naloxone HCL gtt rate decreased to 0.1 mg/hr per Dr. [place].

## 2018-09-23 NOTE — ED Notes (Signed)
PATIENT STATED SHE HAD TO VOID BUT WAS UNABLE TO A BLADDER SCAN WAS DONE AND FOUND PATIENT RETAINING 826mL's. DR

## 2018-09-23 NOTE — ED Notes (Signed)
PATIENT STATED SHE HAD TO VOID BUT WAS UNABLE TO DO UNDER HER OWN POWER. A BLADDER SCAN WAS OBTAINED  SHE WAS RETAINING 844mL's. DR. George Ina WAS CONTACTED AND ADVISED OF HER CONDITION, HE ADVISED TO PUT IN AN IN AND OUT CATH. TASK WAS PERFORMED BY EMT 1150 WAS COLLECTED.

## 2018-09-23 NOTE — ED Notes (Addendum)
PATIENT COMPLAINED OF BLADDER PAIN AND THE URGENT TO VOID BUT UNABLE TO PRODUCE ANY URINE. BLADDER SCAN COMPLETED PATIENT RETAINING 525 mL's . FOLEY CATH PUT IN PLACE  PATIENT VOIDED 810 mL's

## 2018-09-24 DIAGNOSIS — F329 Major depressive disorder, single episode, unspecified: Secondary | ICD-10-CM

## 2018-09-24 DIAGNOSIS — F419 Anxiety disorder, unspecified: Secondary | ICD-10-CM

## 2018-09-24 DIAGNOSIS — G4700 Insomnia, unspecified: Secondary | ICD-10-CM

## 2018-09-24 DIAGNOSIS — G894 Chronic pain syndrome: Secondary | ICD-10-CM

## 2018-09-24 LAB — URINE CULTURE: Culture: <10000 — AB

## 2018-09-24 LAB — BASIC METABOLIC PANEL
Anion gap: 15 (ref 5–15)
BUN: 13 mg/dL (ref 8–23)
CO2: 23 mmol/L (ref 22–32)
Calcium: 9.9 mg/dL (ref 8.9–10.3)
Chloride: 103 mmol/L (ref 98–111)
Creatinine, Ser: 0.84 mg/dL (ref 0.44–1.00)
GFR calc Af Amer: >60 mL/min (ref >60)
GFR calc non Af Amer: >60 mL/min (ref >60)
Glucose, Bld: 184 mg/dL — ABNORMAL HIGH (ref 70–99)
Potassium: 3.1 mmol/L — ABNORMAL LOW (ref 3.5–5.1)
SODIUM: 141 mmol/L (ref 135–145)

## 2018-09-24 MED ORDER — BUPRENORPHINE HCL 2 MG SL SUBL
2.0000 mg | SUBLINGUAL_TABLET | Freq: Every day | SUBLINGUAL | Status: DC
  Administered 2018-09-24 – 2018-09-25 (×2): 2 mg via SUBLINGUAL
  Filled 2018-09-24 (×3): qty 1

## 2018-09-24 MED ORDER — BUPRENORPHINE HCL 900 MCG BU FILM: 900.0000 ug | ORAL_FILM | Freq: Two times a day (BID) | BUCCAL | Status: DC

## 2018-09-24 MED ORDER — POTASSIUM CHLORIDE CRYS ER 20 MEQ PO TBCR
40.0000 meq | EXTENDED_RELEASE_TABLET | Freq: Once | ORAL | Status: AC
  Administered 2018-09-24: 40 meq via ORAL
  Filled 2018-09-24: qty 2

## 2018-09-24 MED ORDER — AMLODIPINE BESYLATE 5 MG PO TABS
5.0000 mg | ORAL_TABLET | Freq: Every day | ORAL | Status: DC
  Administered 2018-09-24 – 2018-09-25 (×2): 5 mg via ORAL
  Filled 2018-09-24 (×3): qty 1

## 2018-09-24 MED ORDER — PROMETHAZINE HCL 25 MG PO TABS
25.0000 mg | ORAL_TABLET | Freq: Once | ORAL | Status: AC
  Administered 2018-09-24: 25 mg via ORAL
  Filled 2018-09-24: qty 1

## 2018-09-24 NOTE — Plan of Care (Signed)
Plan of care reviewed and discussed with the patient. 

## 2018-09-24 NOTE — Progress Notes (Signed)
   09/24/18 1600  Clinical Encounter Type  Visited With Patient  Visit Type Initial;Psychological support;Spiritual support  Referral From Nurse  Consult/Referral To Chaplain  Spiritual Encounters  Spiritual Needs Brochure;Other (Comment) (Advance Directive )  Stress Factors  Patient Stress Factors Other (Comment)   I visited with the patient per Spiritual Care consult to discuss an advance directive. The patient stated that she wants to name her friend her Healthcare power of attorney. I provided the patient the paperwork. We will follow up with the patient to complete the document at a later time.   Please, contact Spiritual Care for further assistance.   Chaplain Clint Bolder M.Div., [facility]

## 2018-09-24 NOTE — Progress Notes (Signed)
PROGRESS NOTE    Stephanie BackersLisa Carrillo  ONG:[id number] DOB: 1955-11-10 DOA: 09/22/2018 PCP: Patient, No Pcp Per   Brief Narrative:  Stephanie BackersLisa Carrillo is a 63 y.o. female with medical history significant of prior opiate substance abuse, anxiety, depression. Patient brought in to ED after friend found her unresponsive and unable to wake up.  Friend tried doing CPR on the patient to no avail. Seems that patient had been having trouble sleeping for past 2 weeks, developed "halucinations".  Was finally able to sleep last night after taking pain pills (we suspect a few too many).  ED Course: In the ED patient was initially apparently quite lethargic, until she was given narcan after which she woke up significantly.  Apparently narcan has had to be repeated per EDP. Admit requested for narcan gtt.   Assessment & Plan:   Principal Problem:   Opiate overdose (HCC) Active Problems:   Anxiety and depression   Insomnia   Chronic pain syndrome   Acute metabolic encephalopathy Suspected opioid overdose - accidental Patient presenting to ED via EMS after being found altered by family/friends likely secondary to accidental opioid overdose.  Patient initially responded well to IV Narcan but subsequently became somnolent and such was started on a Narcan drip.  Patient's with history of chronic pain syndrome on Belbuca and Vicodin at home.  Belbuca has a half-life of 27.6 hours which is likely the factor contributing to her encephalopathy.  Urinalysis unremarkable.  Patient is afebrile without leukocytosis.  UDS positive for opiates.  Chest x-ray with cardiomegaly that is borderline and mild pulmonary vascular congestion.  CT head negative for acute disease process.  EtOH level less than 10 and lactic acid normal at 0.7. --Titrated off Narcan drip yesterday --Continue to monitor respiratory status closely --Continue to monitor on telemetry  Nausea Continues a plane of nausea, unclear etiology.  Possible narcotic  withdrawal.  Now titrated off of Narcan drip.  Home narcotic regimen has been restarted. --Continue supportive care --Antiemetics as needed --Encourage increased oral intake  Hypokalemia Potassium 3.1 today.  Will replete --Repeat BMP with magnesium in the a.m.  Essential hypertension Patient not on any hypertensive regimen at home.  Systolic blood pressures 190s-200s. --Start amlodipine 5 mg p.o. daily --Continue to monitor BP  Anxiety/depression --Continue Prozac 40 mg p.o. daily and clonazepam 0.5 mg p.o. twice daily --Continue trazodone 150 mg p.o. nightly  Chronic pain syndrome --Resume home Belbuca with pharmacy substitution --Continue home Vicodin 5-325 mg p.o. twice daily --Continue home gabapentin 800 mg p.o. 3 times daily  Acute urinary retention Patient with noted retained urine of greater than 800 mL's on admission.  Status post in and out catheter x1 with recurrent urinary retention of greater than 500 mL's and subsequently a Foley catheter was placed.  Now that has been titrated off Narcan drip, Foley catheter removed. --Continue to monitor urinary output --Bladder scan as needed   DVT prophylaxis: Lovenox Code Status: Full code Family Communication: None Disposition Plan: Continue inpatient hospitalization, anticipate discharge home in 1-2 days.   Consultants: None  Procedures: None  Antimicrobials: None   Subjective: Patient seen and examined at bedside, states has been nauseous overnight.  Antiemetics not working well.  Titrated off Narcan drip yesterday.  Will resume home narcotic regimen today.  Continues with Foley catheter in place, will remove and assess for continued urinary retention.  Blood pressure elevated, not on home antihypertensives, will start amlodipine.  No other complaints at this time. Denies headache, no visual changes, no  chest pain, no palpitations, no shortness of breath, no abdominal pain, no fever/chills/night sweats.  No other  concerns overnight per nursing staff.  Objective: Vitals:   09/24/18 1032 09/24/18 1142 09/24/18 1341 09/24/18 1517  BP: (!) 205/117 (!) 202/95 (!) 181/105 (!) 159/92  Pulse: 96 90 (!) 119 (!) 123  Resp: 18 20 19 18   Temp: 98.4 F (36.9 C) 98.3 F (36.8 C) 99.3 F (37.4 C) 99.5 F (37.5 C)  TempSrc: Oral Oral Oral Oral  SpO2: 91% (!) 88% 94% 94%  Weight:      Height:        Intake/Output Summary (Last 24 hours) at 09/24/2018 1631 Last data filed at 09/24/2018 1028 Gross per 24 hour  Intake 610 ml  Output 1425 ml  Net -815 ml   Filed Weights   09/22/18 1706  Weight: 79.4 kg    Examination:  General exam: Slightly anxious on exam Respiratory system: Clear to auscultation. Respiratory effort normal. Cardiovascular system: S1 & S2 heard, RRR. No JVD, murmurs, rubs, gallops or clicks. No pedal edema. Gastrointestinal system: Abdomen is nondistended, soft and nontender. No organomegaly or masses felt. Normal bowel sounds heard. Central nervous system: Alert and oriented. No focal neurological deficits. Extremities: Symmetric 5 x 5 power. Skin: No rashes, lesions or ulcers Psychiatry: Judgement and insight appear normal. Mood & affect appropriate.     Data Reviewed: I have personally reviewed following labs and imaging studies  CBC: Recent Labs  Lab 09/22/18 1741  WBC 10.3  NEUTROABS 7.4  HGB 15.5*  HCT 47.5*  MCV 89.6  PLT 284   Basic Metabolic Panel: Recent Labs  Lab 09/22/18 1741 09/24/18 0558  NA 134* 141  K 4.1 3.1*  CL 103 103  CO2 23 23  GLUCOSE 112* 184*  BUN 13 13  CREATININE 0.87 0.84  CALCIUM 9.2 9.9   GFR: Estimated Creatinine Clearance: 72.8 mL/min (by C-G formula based on SCr of 0.84 mg/dL). Liver Function Tests: Recent Labs  Lab 09/22/18 1759  AST 26  ALT 22  ALKPHOS 84  BILITOT 0.4  PROT 8.2*  ALBUMIN 4.4   No results for input(s): LIPASE, AMYLASE in the last 168 hours. No results for input(s): AMMONIA in the last 168  hours. Coagulation Profile: No results for input(s): INR, PROTIME in the last 168 hours. Cardiac Enzymes: Recent Labs  Lab 09/22/18 1759  TROPONINI <0.03   BNP (last 3 results) No results for input(s): PROBNP in the last 8760 hours. HbA1C: No results for input(s): HGBA1C in the last 72 hours. CBG: Recent Labs  Lab 09/22/18 1725 09/23/18 1340  GLUCAP 108* 123*   Lipid Profile: No results for input(s): CHOL, HDL, LDLCALC, TRIG, CHOLHDL, LDLDIRECT in the last 72 hours. Thyroid Function Tests: No results for input(s): TSH, T4TOTAL, FREET4, T3FREE, THYROIDAB in the last 72 hours. Anemia Panel: No results for input(s): VITAMINB12, FOLATE, FERRITIN, TIBC, IRON, RETICCTPCT in the last 72 hours. Sepsis Labs: Recent Labs  Lab 09/22/18 2109  LATICACIDVEN 0.7    Recent Results (from the past 240 hour(s))  Urine culture     Status: Abnormal   Collection Time: 09/22/18  8:09 PM  Result Value Ref Range Status   Specimen Description   Final    URINE, RANDOM Performed at [facility], [address] [place], [place] [address]    Special Requests   Final    NONE Performed at [facility], [address], [place], [place] [address]  Culture (A)  Final    <10,000 COLONIES/mL INSIGNIFICANT GROWTH Performed at [facility], [address], [place], [place] [address]    Report Status 09/24/2018 FINAL  Final         Radiology Studies: Dg Chest 2 View  Result Date: 09/22/2018 CLINICAL DATA:  Generalized weakness.  Unable to sleep. EXAM: CHEST - 2 VIEW COMPARISON:  None. FINDINGS: Heart size is exaggerated by low lung volumes. Mild pulmonary vascular congestion is present. No focal airspace disease is evident. The visualized soft tissues and bony thorax are unremarkable. IMPRESSION: Borderline cardiomegaly and mild pulmonary vascular congestion. Electronically Signed   By: Marin Roberts M.D.   On: 09/22/2018 19:49   Ct Head Wo  Contrast  Result Date: 09/22/2018 CLINICAL DATA:  Generalized muscle weakness EXAM: CT HEAD WITHOUT CONTRAST TECHNIQUE: Contiguous axial images were obtained from the base of the skull through the vertex without intravenous contrast. COMPARISON:  None. FINDINGS: Brain: No acute territorial infarction, hemorrhage or intracranial mass. The ventricles are nonenlarged. Minimal small vessel ischemic changes of the white matter Vascular: No hyperdense vessels. Tiny gas bubbles in the cavernous sinus, which may be iatrogenic. Mild carotid vascular calcification. Skull: Normal. Negative for fracture or focal lesion. Sinuses/Orbits: No acute finding. Other: None IMPRESSION: 1. No CT evidence for acute intra-abdominal or pelvic abnormality. 2. Minimal small vessel ischemic change of the white matter Electronically Signed   By: Jasmine Pang M.D.   On: 09/22/2018 19:51        Scheduled Meds: . amLODipine  5 mg Oral Daily  . buprenorphine  2 mg Sublingual Daily  . enoxaparin (LOVENOX) injection  40 mg Subcutaneous Q24H  . FLUoxetine  40 mg Oral Daily  . gabapentin  800 mg Oral TID  . traZODone  150 mg Oral QHS   Continuous Infusions:    LOS: 1 day    Time spent: 31 minutes    Alvira Philips [place], DO [facility] Pager [phone]  If 7PM-7AM, please contact night-coverage [email or web address] Password [facility] 09/24/2018, 4:31 PM

## 2018-09-24 NOTE — Progress Notes (Signed)
The patient has had persistent N/V since around 2330.  IV Zofran was given at 0011 and the patient's IV infiltrated.  PO Zofran given instead.  At this time, the patient has gotten no relief from the Zofran.  Clance Boll, NP was notified of the N/V and a one time order for Phenergan was received.

## 2018-09-25 DIAGNOSIS — R338 Other retention of urine: Secondary | ICD-10-CM

## 2018-09-25 DIAGNOSIS — I10 Essential (primary) hypertension: Secondary | ICD-10-CM

## 2018-09-25 LAB — BASIC METABOLIC PANEL
Anion gap: 12 (ref 5–15)
BUN: 20 mg/dL (ref 8–23)
CALCIUM: 9.7 mg/dL (ref 8.9–10.3)
CO2: 32 mmol/L (ref 22–32)
Chloride: 98 mmol/L (ref 98–111)
Creatinine, Ser: 0.94 mg/dL (ref 0.44–1.00)
GFR calc non Af Amer: >60 mL/min (ref >60)
Glucose, Bld: 99 mg/dL (ref 70–99)
Potassium: 3.6 mmol/L (ref 3.5–5.1)
SODIUM: 142 mmol/L (ref 135–145)

## 2018-09-25 LAB — MAGNESIUM: Magnesium: 2 mg/dL (ref 1.7–2.4)

## 2018-09-25 MED ORDER — PROMETHAZINE HCL 25 MG/ML IJ SOLN
12.5000 mg | Freq: Once | INTRAMUSCULAR | Status: AC
  Administered 2018-09-25: 12.5 mg via INTRAVENOUS
  Filled 2018-09-25: qty 1

## 2018-09-25 MED ORDER — ONDANSETRON HCL 4 MG PO TABS: 4.0000 mg | ORAL_TABLET | Freq: Four times a day (QID) | ORAL | 0 refills | Status: DC | PRN

## 2018-09-25 MED ORDER — AMLODIPINE BESYLATE 5 MG PO TABS: 5.0000 mg | ORAL_TABLET | Freq: Every day | ORAL | 0 refills | Status: DC

## 2018-09-25 NOTE — Discharge Summary (Signed)
Physician Discharge Summary  Stephanie Carrillo BBU:[id number] DOB: 04-15-1956 DOA: 09/22/2018  PCP: Patient, No Pcp Per  Admit date: 09/22/2018 Discharge date: 09/25/2018  Admitted From: Home Disposition:  Home  Recommendations for Outpatient Follow-up:  1. Follow up with PCP in 1-2 weeks 2. Continue to monitor BP, new start on amlodipine   Home Health: No Equipment/Devices: None  Discharge Condition: Stable CODE STATUS: Full Code Diet recommendation: Heart Healthy   History of present illness:  Stephanie Carrillo a 64 y.o.femalewith medical history significant ofprior opiate substance abuse, anxiety, depression. Patient brought in to ED after friend found her unresponsive and unable to wake up. Friend tried doing CPR on the patient to no avail. Seems that patient had been having trouble sleeping for past 2 weeks, developed "halucinations". Was finally able to sleep last night after taking pain pills (we suspect a few too many).  ED Course:In the ED patient was initially apparently quite lethargic, until she was given narcan after which she woke up significantly. Apparently narcan has had to be repeated per EDP. Admit requested for narcan gtt.  Hospital course:  Acute metabolic encephalopathy Suspected opioid overdose - accidental Patient presenting to ED via EMS after being found altered by family/friends likely secondary to accidental opioid overdose.  Patient initially responded well to IV Narcan but subsequently became somnolent and such was started on a Narcan drip.  Patient's with history of chronic pain syndrome on Belbuca and Vicodin at home.  Belbuca has a half-life of 27.6 hours which is likely the factor contributing to her encephalopathy.  Urinalysis unremarkable.  Patient is afebrile without leukocytosis.  UDS positive for opiates.  Chest x-ray with cardiomegaly that is borderline and mild pulmonary vascular congestion.  CT head negative for acute disease process.  EtOH  level less than 10 and lactic acid normal at 0.7.  Patient required Narcan drip over the initial 24 hours which was titrated off.  She was monitored on telemetry without any concerning arrhythmias.  Her home narcotic regimen was restarted without issue.  Patient instructed not to take any additional narcotic doses following discharge.  Anxiety/depression Continue Prozac 40 mg p.o. daily and clonazepam 0.5 mg p.o. twice daily Continue trazodone 150 mg p.o. nightly  Chronic pain syndrome Resume home Belbuca 900 mcg PO BID and Vicodin 5-325 mg p.o. twice daily,  gabapentin 800 mg p.o. 3 times daily  Acute urinary retention Patient unable to void, bladder scan noted greater than 864mL's of retained urine; status post in and out cath.  Later this afternoon patient continues unable to void, repeat bladder scan showing greater than 500 mL's of retained urine.  Patient initially required a Foley catheter which was subsequently removed 24 hours after insertion.  Patient was able to void without issue following removal.  Essential hypertension Patient's blood pressure on admission with systolic blood pressures in the 190s-200s.  She was started on amlodipine 5 mg p.o. daily with increased BP control.  Prescription sent for 30-day supply.  Patient instructed to follow-up with her PCP for further evaluation titration of antihypertensives as necessary.  Discharge Diagnoses:  Active Problems:   Anxiety and depression   Insomnia   Chronic pain syndrome    Discharge Instructions  Discharge Instructions    Call MD for:  difficulty breathing, headache or visual disturbances   Complete by:  As directed    Call MD for:  persistant nausea and vomiting   Complete by:  As directed    Call MD for:  severe uncontrolled  pain   Complete by:  As directed    Call MD for:  temperature >100.4   Complete by:  As directed    Diet - low sodium heart healthy   Complete by:  As directed    Increase activity  slowly   Complete by:  As directed      Allergies as of 09/25/2018      Reactions   Ciprofloxacin Shortness Of Breath, Other (See Comments)   Respiratory arrest   Penicillins Shortness Of Breath, Rash   Did it involve swelling of the face/tongue/throat, SOB, or low BP? y Did it involve sudden or severe rash/hives, skin peeling, or any reaction on the inside of your mouth or nose? y Did you need to seek medical attention at a hospital or doctor's office? n When did it last happen?1985 If all above answers are "NO", may proceed with cephalosporin use.      Medication List    TAKE these medications   amLODipine 5 MG tablet Commonly known as:  NORVASC Take 1 tablet (5 mg total) by mouth daily.   BELBUCA 900 MCG Film Generic drug:  Buprenorphine HCl Place 900 mcg inside cheek 2 (two) times daily.   cimetidine 200 MG tablet Commonly known as:  TAGAMET Take 200 mg by mouth daily as needed (upset stomach and indigestion).   clonazePAM 0.5 MG tablet Commonly known as:  KLONOPIN Take 0.5 mg by mouth 2 (two) times daily as needed for anxiety.   diphenhydrAMINE 25 MG tablet Commonly known as:  BENADRYL Take 25 mg by mouth at bedtime as needed for sleep.   FLUoxetine 40 MG capsule Commonly known as:  PROZAC Take 40 mg by mouth daily.   gabapentin 800 MG tablet Commonly known as:  NEURONTIN Take 800 mg by mouth 3 (three) times daily.   HYDROcodone-acetaminophen 5-325 MG tablet Commonly known as:  NORCO/VICODIN Take 1 tablet by mouth 2 (two) times daily as needed for moderate pain.   ibuprofen 100 MG tablet Commonly known as:  ADVIL,MOTRIN Take 200 mg by mouth 2 (two) times daily as needed for pain.   ondansetron 4 MG tablet Commonly known as:  ZOFRAN Take 1 tablet (4 mg total) by mouth every 6 (six) hours as needed for nausea.   tiZANidine 4 MG capsule Commonly known as:  ZANAFLEX Take 4 mg by mouth 3 (three) times daily.   traZODone 150 MG tablet Commonly known as:   DESYREL Take 150 mg by mouth at bedtime.      Follow-up Information    PCP Follow up in 1 week(s).   Why:  F/U PCP in 1-2 weeks following hospitalization         Allergies  Allergen Reactions  . Ciprofloxacin Shortness Of Breath and Other (See Comments)    Respiratory arrest  . Penicillins Shortness Of Breath and Rash    Did it involve swelling of the face/tongue/throat, SOB, or low BP? y Did it involve sudden or severe rash/hives, skin peeling, or any reaction on the inside of your mouth or nose? y Did you need to seek medical attention at a hospital or doctor's office? n When did it last happen?1985 If all above answers are "NO", may proceed with cephalosporin use.    Consultations: none    Procedures/Studies: Dg Chest 2 View  Result Date: 09/22/2018 CLINICAL DATA:  Generalized weakness.  Unable to sleep. EXAM: CHEST - 2 VIEW COMPARISON:  None. FINDINGS: Heart size is exaggerated by low lung volumes. Mild  pulmonary vascular congestion is present. No focal airspace disease is evident. The visualized soft tissues and bony thorax are unremarkable. IMPRESSION: Borderline cardiomegaly and mild pulmonary vascular congestion. Electronically Signed   By: Marin Roberts M.D.   On: 09/22/2018 19:49   Ct Head Wo Contrast  Result Date: 09/22/2018 CLINICAL DATA:  Generalized muscle weakness EXAM: CT HEAD WITHOUT CONTRAST TECHNIQUE: Contiguous axial images were obtained from the base of the skull through the vertex without intravenous contrast. COMPARISON:  None. FINDINGS: Brain: No acute territorial infarction, hemorrhage or intracranial mass. The ventricles are nonenlarged. Minimal small vessel ischemic changes of the white matter Vascular: No hyperdense vessels. Tiny gas bubbles in the cavernous sinus, which may be iatrogenic. Mild carotid vascular calcification. Skull: Normal. Negative for fracture or focal lesion. Sinuses/Orbits: No acute finding. Other: None IMPRESSION: 1. No  CT evidence for acute intra-abdominal or pelvic abnormality. 2. Minimal small vessel ischemic change of the white matter Electronically Signed   By: Jasmine Pang M.D.   On: 09/22/2018 19:51       Subjective:   Discharge Exam: Vitals:   09/25/18 0512 09/25/18 0603  BP: (!) 141/96   Pulse: (!) 113 83  Resp: 16   Temp: 99 F (37.2 C)   SpO2: 92%    Vitals:   09/24/18 1517 09/24/18 1705 09/25/18 0512 09/25/18 0603  BP: (!) 159/92 (!) 156/90 (!) 141/96   Pulse: (!) 123 (!) 103 (!) 113 83  Resp: Temp: 99.5 F (37.5 C) 98.5 F (36.9 C) 99 F (37.2 C)   TempSrc: Oral Oral Oral   SpO2: 94% 93% 92%   Weight:      Height:        General: Pt is alert, awake, not in acute distress Cardiovascular: RRR, S1/S2 +, no rubs, no gallops Respiratory: CTA bilaterally, no wheezing, no rhonchi Abdominal: Soft, NT, ND, bowel sounds + Extremities: no edema, no cyanosis    The results of significant diagnostics from this hospitalization (including imaging, microbiology, ancillary and laboratory) are listed below for reference.     Microbiology: Recent Results (from the past 240 hour(s))  Urine culture     Status: Abnormal   Collection Time: 09/22/18  8:09 PM  Result Value Ref Range Status   Specimen Description   Final    URINE, RANDOM Performed at [facility], [address] [place], [place] [address]    Special Requests   Final    NONE Performed at [facility], [address], [place], [place] [address]    Culture (A)  Final    <10,000 COLONIES/mL INSIGNIFICANT GROWTH Performed at [facility], [address] [place], [place] [address]    Report Status 09/24/2018 FINAL  Final     Labs: BNP (last 3 results) No results for input(s): BNP in the last 8760 hours. Basic Metabolic Panel: Recent Labs  Lab 09/22/18 1741 09/24/18 0558 09/25/18 0531  NA 134* 141 142  K 4.1 3.1* 3.6  CL 103 103 98  CO2 23 23 32   GLUCOSE 112* 184* 99  BUN CREATININE 0.87 0.84 0.94  CALCIUM 9.2 9.9 9.7  MG  --   --  2.0   Liver Function Tests: Recent Labs  Lab 09/22/18 1759  AST 26  ALT 22  ALKPHOS 84  BILITOT 0.4  PROT 8.2*  ALBUMIN 4.4   No results for input(s): LIPASE, AMYLASE in the last 168  hours. No results for input(s): AMMONIA in the last 168 hours. CBC: Recent Labs  Lab 09/22/18 1741  WBC 10.3  NEUTROABS 7.4  HGB 15.5*  HCT 47.5*  MCV 89.6  PLT 284   Cardiac Enzymes: Recent Labs  Lab 09/22/18 1759  TROPONINI <0.03   BNP: Invalid input(s): POCBNP CBG: Recent Labs  Lab 09/22/18 1725 09/23/18 1340  GLUCAP 108* 123*   D-Dimer No results for input(s): DDIMER in the last 72 hours. Hgb A1c No results for input(s): HGBA1C in the last 72 hours. Lipid Profile No results for input(s): CHOL, HDL, LDLCALC, TRIG, CHOLHDL, LDLDIRECT in the last 72 hours. Thyroid function studies No results for input(s): TSH, T4TOTAL, T3FREE, THYROIDAB in the last 72 hours.  Invalid input(s): FREET3 Anemia work up No results for input(s): VITAMINB12, FOLATE, FERRITIN, TIBC, IRON, RETICCTPCT in the last 72 hours. Urinalysis    Component Value Date/Time   COLORURINE YELLOW 09/22/2018 2009   APPEARANCEUR CLEAR 09/22/2018 2009   LABSPEC 1.010 09/22/2018 2009   PHURINE 7.0 09/22/2018 2009   GLUCOSEU NEGATIVE 09/22/2018 2009   HGBUR NEGATIVE 09/22/2018 2009   BILIRUBINUR NEGATIVE 09/22/2018 2009   KETONESUR NEGATIVE 09/22/2018 2009   PROTEINUR NEGATIVE 09/22/2018 2009   NITRITE NEGATIVE 09/22/2018 2009   LEUKOCYTESUR NEGATIVE 09/22/2018 2009   Sepsis Labs Invalid input(s): PROCALCITONIN,  WBC,  LACTICIDVEN Microbiology Recent Results (from the past 240 hour(s))  Urine culture     Status: Abnormal   Collection Time: 09/22/18  8:09 PM  Result Value Ref Range Status   Specimen Description   Final    URINE, RANDOM Performed at [facility], [address],  [place], [place] [address]    Special Requests   Final    NONE Performed at [facility], [address], [place], [place] [address]    Culture (A)  Final    <10,000 COLONIES/mL INSIGNIFICANT GROWTH Performed at [facility], [address] [place], [place] [address]    Report Status 09/24/2018 FINAL  Final     Time coordinating discharge: Over 30 minutes  SIGNED:   Alvira Philips [place], DO  [facility] 09/25/2018, 8:20 AM

## 2018-10-14 ENCOUNTER — Ambulatory Visit: Payer: Medicare Other | Admitting: Family Medicine

## 2018-10-18 ENCOUNTER — Encounter: Payer: Self-pay | Admitting: Family Medicine

## 2019-01-06 DIAGNOSIS — M25561 Pain in right knee: Secondary | ICD-10-CM | POA: Diagnosis not present

## 2019-01-06 DIAGNOSIS — G894 Chronic pain syndrome: Secondary | ICD-10-CM | POA: Diagnosis not present

## 2019-01-06 DIAGNOSIS — Z79899 Other long term (current) drug therapy: Secondary | ICD-10-CM | POA: Diagnosis not present

## 2019-01-06 DIAGNOSIS — M25562 Pain in left knee: Secondary | ICD-10-CM | POA: Diagnosis not present

## 2019-01-24 DIAGNOSIS — F341 Dysthymic disorder: Secondary | ICD-10-CM | POA: Diagnosis not present

## 2019-01-24 DIAGNOSIS — F411 Generalized anxiety disorder: Secondary | ICD-10-CM | POA: Diagnosis not present

## 2019-01-24 DIAGNOSIS — G4700 Insomnia, unspecified: Secondary | ICD-10-CM | POA: Diagnosis not present

## 2019-02-03 DIAGNOSIS — M5030 Other cervical disc degeneration, unspecified cervical region: Secondary | ICD-10-CM | POA: Diagnosis not present

## 2019-02-03 DIAGNOSIS — Z79899 Other long term (current) drug therapy: Secondary | ICD-10-CM | POA: Diagnosis not present

## 2019-02-03 DIAGNOSIS — G894 Chronic pain syndrome: Secondary | ICD-10-CM | POA: Diagnosis not present

## 2019-03-03 DIAGNOSIS — G894 Chronic pain syndrome: Secondary | ICD-10-CM | POA: Diagnosis not present

## 2019-03-03 DIAGNOSIS — M5030 Other cervical disc degeneration, unspecified cervical region: Secondary | ICD-10-CM | POA: Diagnosis not present

## 2019-03-03 DIAGNOSIS — Z79899 Other long term (current) drug therapy: Secondary | ICD-10-CM | POA: Diagnosis not present

## 2019-03-30 DIAGNOSIS — Z1159 Encounter for screening for other viral diseases: Secondary | ICD-10-CM | POA: Diagnosis not present

## 2019-03-30 DIAGNOSIS — G894 Chronic pain syndrome: Secondary | ICD-10-CM | POA: Diagnosis not present

## 2019-03-30 DIAGNOSIS — Z1211 Encounter for screening for malignant neoplasm of colon: Secondary | ICD-10-CM | POA: Diagnosis not present

## 2019-03-30 DIAGNOSIS — M5030 Other cervical disc degeneration, unspecified cervical region: Secondary | ICD-10-CM | POA: Diagnosis not present

## 2019-03-30 DIAGNOSIS — Z79899 Other long term (current) drug therapy: Secondary | ICD-10-CM | POA: Diagnosis not present

## 2019-04-20 DIAGNOSIS — F341 Dysthymic disorder: Secondary | ICD-10-CM | POA: Diagnosis not present

## 2019-04-20 DIAGNOSIS — G4700 Insomnia, unspecified: Secondary | ICD-10-CM | POA: Diagnosis not present

## 2019-04-20 DIAGNOSIS — Z634 Disappearance and death of family member: Secondary | ICD-10-CM | POA: Diagnosis not present

## 2019-04-20 DIAGNOSIS — F4320 Adjustment disorder, unspecified: Secondary | ICD-10-CM | POA: Diagnosis not present

## 2019-04-28 DIAGNOSIS — M5030 Other cervical disc degeneration, unspecified cervical region: Secondary | ICD-10-CM | POA: Diagnosis not present

## 2019-04-28 DIAGNOSIS — Z79899 Other long term (current) drug therapy: Secondary | ICD-10-CM | POA: Diagnosis not present

## 2019-04-28 DIAGNOSIS — Z1231 Encounter for screening mammogram for malignant neoplasm of breast: Secondary | ICD-10-CM | POA: Diagnosis not present

## 2019-04-28 DIAGNOSIS — G894 Chronic pain syndrome: Secondary | ICD-10-CM | POA: Diagnosis not present

## 2019-05-03 ENCOUNTER — Other Ambulatory Visit: Payer: Self-pay | Admitting: Nurse Practitioner

## 2019-05-03 DIAGNOSIS — Z1231 Encounter for screening mammogram for malignant neoplasm of breast: Secondary | ICD-10-CM

## 2019-05-31 DIAGNOSIS — M1712 Unilateral primary osteoarthritis, left knee: Secondary | ICD-10-CM | POA: Diagnosis not present

## 2019-05-31 DIAGNOSIS — G894 Chronic pain syndrome: Secondary | ICD-10-CM | POA: Diagnosis not present

## 2019-05-31 DIAGNOSIS — M5030 Other cervical disc degeneration, unspecified cervical region: Secondary | ICD-10-CM | POA: Diagnosis not present

## 2019-06-21 ENCOUNTER — Ambulatory Visit: Payer: Medicare Other

## 2019-07-04 DIAGNOSIS — G894 Chronic pain syndrome: Secondary | ICD-10-CM | POA: Diagnosis not present

## 2019-07-04 DIAGNOSIS — Z0000 Encounter for general adult medical examination without abnormal findings: Secondary | ICD-10-CM | POA: Diagnosis not present

## 2019-07-04 DIAGNOSIS — M1712 Unilateral primary osteoarthritis, left knee: Secondary | ICD-10-CM | POA: Diagnosis not present

## 2019-07-04 DIAGNOSIS — M5030 Other cervical disc degeneration, unspecified cervical region: Secondary | ICD-10-CM | POA: Diagnosis not present

## 2019-07-13 DIAGNOSIS — F411 Generalized anxiety disorder: Secondary | ICD-10-CM | POA: Diagnosis not present

## 2019-07-13 DIAGNOSIS — F341 Dysthymic disorder: Secondary | ICD-10-CM | POA: Diagnosis not present

## 2019-07-13 DIAGNOSIS — G4700 Insomnia, unspecified: Secondary | ICD-10-CM | POA: Diagnosis not present

## 2019-08-02 DIAGNOSIS — R072 Precordial pain: Secondary | ICD-10-CM | POA: Diagnosis not present

## 2019-08-02 DIAGNOSIS — N12 Tubulo-interstitial nephritis, not specified as acute or chronic: Secondary | ICD-10-CM | POA: Diagnosis not present

## 2019-08-02 DIAGNOSIS — R300 Dysuria: Secondary | ICD-10-CM | POA: Diagnosis not present

## 2019-08-02 DIAGNOSIS — Z79899 Other long term (current) drug therapy: Secondary | ICD-10-CM | POA: Diagnosis not present

## 2019-08-03 DIAGNOSIS — N12 Tubulo-interstitial nephritis, not specified as acute or chronic: Secondary | ICD-10-CM | POA: Diagnosis not present

## 2019-08-03 DIAGNOSIS — R300 Dysuria: Secondary | ICD-10-CM | POA: Diagnosis not present

## 2019-08-11 ENCOUNTER — Ambulatory Visit: Payer: Medicare Other

## 2019-09-01 DIAGNOSIS — G894 Chronic pain syndrome: Secondary | ICD-10-CM | POA: Diagnosis not present

## 2019-09-01 DIAGNOSIS — Z79899 Other long term (current) drug therapy: Secondary | ICD-10-CM | POA: Diagnosis not present

## 2019-09-16 ENCOUNTER — Ambulatory Visit: Payer: Medicare Other

## 2019-10-01 DIAGNOSIS — R0602 Shortness of breath: Secondary | ICD-10-CM | POA: Diagnosis not present

## 2019-10-01 DIAGNOSIS — R400 Somnolence: Secondary | ICD-10-CM | POA: Diagnosis not present

## 2019-10-01 DIAGNOSIS — G894 Chronic pain syndrome: Secondary | ICD-10-CM | POA: Diagnosis not present

## 2019-10-01 DIAGNOSIS — Z79899 Other long term (current) drug therapy: Secondary | ICD-10-CM | POA: Diagnosis not present

## 2019-10-06 DIAGNOSIS — F411 Generalized anxiety disorder: Secondary | ICD-10-CM | POA: Diagnosis not present

## 2019-10-06 DIAGNOSIS — F341 Dysthymic disorder: Secondary | ICD-10-CM | POA: Diagnosis not present

## 2019-10-06 DIAGNOSIS — G4700 Insomnia, unspecified: Secondary | ICD-10-CM | POA: Diagnosis not present

## 2019-10-18 ENCOUNTER — Ambulatory Visit: Payer: Medicare Other

## 2019-10-26 ENCOUNTER — Ambulatory Visit: Payer: Medicare Other

## 2019-11-01 DIAGNOSIS — G894 Chronic pain syndrome: Secondary | ICD-10-CM | POA: Diagnosis not present

## 2019-11-01 DIAGNOSIS — Z79899 Other long term (current) drug therapy: Secondary | ICD-10-CM | POA: Diagnosis not present

## 2019-11-01 DIAGNOSIS — N400 Enlarged prostate without lower urinary tract symptoms: Secondary | ICD-10-CM | POA: Diagnosis not present

## 2019-11-01 DIAGNOSIS — H9209 Otalgia, unspecified ear: Secondary | ICD-10-CM | POA: Diagnosis not present

## 2019-11-14 ENCOUNTER — Ambulatory Visit: Payer: Medicare Other

## 2019-11-17 ENCOUNTER — Ambulatory Visit: Payer: Medicare Other

## 2019-11-30 DIAGNOSIS — R300 Dysuria: Secondary | ICD-10-CM | POA: Diagnosis not present

## 2019-11-30 DIAGNOSIS — Z79899 Other long term (current) drug therapy: Secondary | ICD-10-CM | POA: Diagnosis not present

## 2019-11-30 DIAGNOSIS — G894 Chronic pain syndrome: Secondary | ICD-10-CM | POA: Diagnosis not present

## 2020-01-03 DIAGNOSIS — Z79899 Other long term (current) drug therapy: Secondary | ICD-10-CM | POA: Diagnosis not present

## 2020-01-03 DIAGNOSIS — G894 Chronic pain syndrome: Secondary | ICD-10-CM | POA: Diagnosis not present

## 2020-01-04 DIAGNOSIS — G4700 Insomnia, unspecified: Secondary | ICD-10-CM | POA: Diagnosis not present

## 2020-01-04 DIAGNOSIS — F411 Generalized anxiety disorder: Secondary | ICD-10-CM | POA: Diagnosis not present

## 2020-01-04 DIAGNOSIS — F341 Dysthymic disorder: Secondary | ICD-10-CM | POA: Diagnosis not present

## 2020-01-27 ENCOUNTER — Emergency Department (HOSPITAL_COMMUNITY): Payer: Medicare Other

## 2020-01-27 ENCOUNTER — Inpatient Hospital Stay (HOSPITAL_COMMUNITY)
Admission: EM | Admit: 2020-01-27 | Discharge: 2020-01-30 | DRG: 917 | Disposition: A | Discharge status: Home Health | Payer: Medicare Other | Attending: Internal Medicine | Admitting: Internal Medicine

## 2020-01-27 ENCOUNTER — Inpatient Hospital Stay (HOSPITAL_COMMUNITY): Payer: Medicare Other

## 2020-01-27 DIAGNOSIS — G92 Toxic encephalopathy: Secondary | ICD-10-CM | POA: Diagnosis present

## 2020-01-27 DIAGNOSIS — R4182 Altered mental status, unspecified: Secondary | ICD-10-CM | POA: Diagnosis not present

## 2020-01-27 DIAGNOSIS — R112 Nausea with vomiting, unspecified: Secondary | ICD-10-CM | POA: Diagnosis not present

## 2020-01-27 DIAGNOSIS — Z79891 Long term (current) use of opiate analgesic: Secondary | ICD-10-CM

## 2020-01-27 DIAGNOSIS — K529 Noninfective gastroenteritis and colitis, unspecified: Secondary | ICD-10-CM | POA: Diagnosis present

## 2020-01-27 DIAGNOSIS — N390 Urinary tract infection, site not specified: Secondary | ICD-10-CM | POA: Diagnosis present

## 2020-01-27 DIAGNOSIS — Z87891 Personal history of nicotine dependence: Secondary | ICD-10-CM | POA: Diagnosis not present

## 2020-01-27 DIAGNOSIS — I10 Essential (primary) hypertension: Secondary | ICD-10-CM | POA: Diagnosis present

## 2020-01-27 DIAGNOSIS — G4700 Insomnia, unspecified: Secondary | ICD-10-CM | POA: Diagnosis present

## 2020-01-27 DIAGNOSIS — Z20822 Contact with and (suspected) exposure to covid-19: Secondary | ICD-10-CM | POA: Diagnosis not present

## 2020-01-27 DIAGNOSIS — R471 Dysarthria and anarthria: Secondary | ICD-10-CM | POA: Diagnosis not present

## 2020-01-27 DIAGNOSIS — T424X1A Poisoning by benzodiazepines, accidental (unintentional), initial encounter: Principal | ICD-10-CM | POA: Diagnosis present

## 2020-01-27 DIAGNOSIS — F1110 Opioid abuse, uncomplicated: Secondary | ICD-10-CM | POA: Diagnosis present

## 2020-01-27 DIAGNOSIS — Z8661 Personal history of infections of the central nervous system: Secondary | ICD-10-CM

## 2020-01-27 DIAGNOSIS — R110 Nausea: Secondary | ICD-10-CM | POA: Diagnosis not present

## 2020-01-27 DIAGNOSIS — E876 Hypokalemia: Secondary | ICD-10-CM | POA: Diagnosis not present

## 2020-01-27 DIAGNOSIS — M6282 Rhabdomyolysis: Secondary | ICD-10-CM | POA: Diagnosis present

## 2020-01-27 DIAGNOSIS — G9340 Encephalopathy, unspecified: Secondary | ICD-10-CM | POA: Diagnosis not present

## 2020-01-27 DIAGNOSIS — F13239 Sedative, hypnotic or anxiolytic dependence with withdrawal, unspecified: Secondary | ICD-10-CM | POA: Diagnosis not present

## 2020-01-27 DIAGNOSIS — F329 Major depressive disorder, single episode, unspecified: Secondary | ICD-10-CM | POA: Diagnosis not present

## 2020-01-27 DIAGNOSIS — F419 Anxiety disorder, unspecified: Secondary | ICD-10-CM | POA: Diagnosis not present

## 2020-01-27 DIAGNOSIS — Z881 Allergy status to other antibiotic agents status: Secondary | ICD-10-CM

## 2020-01-27 DIAGNOSIS — E669 Obesity, unspecified: Secondary | ICD-10-CM | POA: Diagnosis not present

## 2020-01-27 DIAGNOSIS — R000 Tachycardia, unspecified: Secondary | ICD-10-CM

## 2020-01-27 DIAGNOSIS — R1032 Left lower quadrant pain: Secondary | ICD-10-CM

## 2020-01-27 DIAGNOSIS — I248 Other forms of acute ischemic heart disease: Secondary | ICD-10-CM | POA: Diagnosis not present

## 2020-01-27 DIAGNOSIS — Z6832 Body mass index (BMI) 32.0-32.9, adult: Secondary | ICD-10-CM

## 2020-01-27 DIAGNOSIS — B9620 Unspecified Escherichia coli [E. coli] as the cause of diseases classified elsewhere: Secondary | ICD-10-CM | POA: Diagnosis not present

## 2020-01-27 DIAGNOSIS — Z1629 Resistance to other single specified antibiotic: Secondary | ICD-10-CM | POA: Diagnosis present

## 2020-01-27 DIAGNOSIS — R410 Disorientation, unspecified: Secondary | ICD-10-CM | POA: Diagnosis not present

## 2020-01-27 DIAGNOSIS — Z79899 Other long term (current) drug therapy: Secondary | ICD-10-CM

## 2020-01-27 DIAGNOSIS — G894 Chronic pain syndrome: Secondary | ICD-10-CM | POA: Diagnosis present

## 2020-01-27 DIAGNOSIS — Z880 Allergy status to penicillin: Secondary | ICD-10-CM

## 2020-01-27 DIAGNOSIS — Z981 Arthrodesis status: Secondary | ICD-10-CM

## 2020-01-27 DIAGNOSIS — R0902 Hypoxemia: Secondary | ICD-10-CM | POA: Diagnosis not present

## 2020-01-27 DIAGNOSIS — F13939 Sedative, hypnotic or anxiolytic use, unspecified with withdrawal, unspecified: Secondary | ICD-10-CM

## 2020-01-27 LAB — URINALYSIS, ROUTINE W REFLEX MICROSCOPIC
Bilirubin Urine: NEGATIVE
Glucose, UA: NEGATIVE mg/dL
Ketones, ur: NEGATIVE mg/dL
Nitrite: NEGATIVE
Protein, ur: 100 mg/dL — AB
Specific Gravity, Urine: 1.041 — ABNORMAL HIGH (ref 1.005–1.030)
pH: 6 (ref 5.0–8.0)

## 2020-01-27 LAB — COMPREHENSIVE METABOLIC PANEL
ALT: 22 U/L (ref 0–44)
AST: 52 U/L — ABNORMAL HIGH (ref 15–41)
Albumin: 4.1 g/dL (ref 3.5–5.0)
Alkaline Phosphatase: 96 U/L (ref 38–126)
Anion gap: 19 — ABNORMAL HIGH (ref 5–15)
BUN: 18 mg/dL (ref 8–23)
CO2: 21 mmol/L — ABNORMAL LOW (ref 22–32)
Calcium: 10 mg/dL (ref 8.9–10.3)
Chloride: 98 mmol/L (ref 98–111)
Creatinine, Ser: 0.94 mg/dL (ref 0.44–1.00)
GFR calc Af Amer: >60 mL/min (ref >60)
GFR calc non Af Amer: >60 mL/min (ref >60)
Glucose, Bld: 156 mg/dL — ABNORMAL HIGH (ref 70–99)
Potassium: 2.6 mmol/L — CL (ref 3.5–5.1)
Sodium: 138 mmol/L (ref 135–145)
Total Bilirubin: 0.9 mg/dL (ref 0.3–1.2)
Total Protein: 9 g/dL — ABNORMAL HIGH (ref 6.5–8.1)

## 2020-01-27 LAB — TROPONIN I (HIGH SENSITIVITY)
Troponin I (High Sensitivity): 258 ng/L (ref <18)
Troponin I (High Sensitivity): 242 ng/L (ref <18)

## 2020-01-27 LAB — I-STAT CHEM 8, ED
BUN: 21 mg/dL (ref 8–23)
Calcium, Ion: 1.05 mmol/L — ABNORMAL LOW (ref 1.15–1.40)
Chloride: 99 mmol/L (ref 98–111)
Creatinine, Ser: 0.8 mg/dL (ref 0.44–1.00)
Glucose, Bld: 151 mg/dL — ABNORMAL HIGH (ref 70–99)
HCT: 53 % — ABNORMAL HIGH (ref 36.0–46.0)
Hemoglobin: 18 g/dL — ABNORMAL HIGH (ref 12.0–15.0)
Potassium: 2.5 mmol/L — CL (ref 3.5–5.1)
Sodium: 142 mmol/L (ref 135–145)
TCO2: 29 mmol/L (ref 22–32)

## 2020-01-27 LAB — CBC WITH DIFFERENTIAL/PLATELET
Abs Immature Granulocytes: 0 10*3/uL (ref 0.00–0.07)
Basophils Absolute: 0 10*3/uL (ref 0.0–0.1)
Basophils Relative: 0 %
Eosinophils Absolute: 0 10*3/uL (ref 0.0–0.5)
Eosinophils Relative: 0 %
HCT: 53.2 % — ABNORMAL HIGH (ref 36.0–46.0)
Hemoglobin: 18.1 g/dL — ABNORMAL HIGH (ref 12.0–15.0)
Lymphocytes Relative: 5 %
Lymphs Abs: 1.3 10*3/uL (ref 0.7–4.0)
MCH: 31.8 pg (ref 26.0–34.0)
MCHC: 34 g/dL (ref 30.0–36.0)
MCV: 93.3 fL (ref 80.0–100.0)
Monocytes Absolute: 2.3 10*3/uL — ABNORMAL HIGH (ref 0.1–1.0)
Monocytes Relative: 9 %
Neutro Abs: 22.4 10*3/uL — ABNORMAL HIGH (ref 1.7–7.7)
Neutrophils Relative %: 86 %
Platelets: 419 10*3/uL — ABNORMAL HIGH (ref 150–400)
RBC: 5.7 MIL/uL — ABNORMAL HIGH (ref 3.87–5.11)
RDW: 13.4 % (ref 11.5–15.5)
WBC: 26 10*3/uL — ABNORMAL HIGH (ref 4.0–10.5)
nRBC: 0 % (ref 0.0–0.2)
nRBC: 0 /100 WBC

## 2020-01-27 LAB — CBC
HCT: 49.4 % — ABNORMAL HIGH (ref 36.0–46.0)
Hemoglobin: 16.8 g/dL — ABNORMAL HIGH (ref 12.0–15.0)
MCH: 31.9 pg (ref 26.0–34.0)
MCHC: 34 g/dL (ref 30.0–36.0)
MCV: 93.7 fL (ref 80.0–100.0)
Platelets: 362 10*3/uL (ref 150–400)
RBC: 5.27 MIL/uL — ABNORMAL HIGH (ref 3.87–5.11)
RDW: 13.6 % (ref 11.5–15.5)
WBC: 28 10*3/uL — ABNORMAL HIGH (ref 4.0–10.5)
nRBC: 0 % (ref 0.0–0.2)

## 2020-01-27 LAB — HIV ANTIBODY (ROUTINE TESTING W REFLEX): HIV Screen 4th Generation wRfx: NONREACTIVE

## 2020-01-27 LAB — ACETAMINOPHEN LEVEL: Acetaminophen (Tylenol), Serum: <10 ug/mL — ABNORMAL LOW (ref 10–30)

## 2020-01-27 LAB — CREATININE, SERUM
Creatinine, Ser: 0.69 mg/dL (ref 0.44–1.00)
GFR calc Af Amer: >60 mL/min (ref >60)
GFR calc non Af Amer: >60 mL/min (ref >60)

## 2020-01-27 LAB — ETHANOL: Alcohol, Ethyl (B): <10 mg/dL (ref <10)

## 2020-01-27 LAB — BRAIN NATRIURETIC PEPTIDE: B Natriuretic Peptide: 255.8 pg/mL — ABNORMAL HIGH (ref 0.0–100.0)

## 2020-01-27 LAB — SALICYLATE LEVEL: Salicylate Lvl: <7 mg/dL — ABNORMAL LOW (ref 7.0–30.0)

## 2020-01-27 LAB — CK: Total CK: 9875 U/L — ABNORMAL HIGH (ref 38–234)

## 2020-01-27 LAB — TSH: TSH: 0.892 u[IU]/mL (ref 0.350–4.500)

## 2020-01-27 LAB — RAPID URINE DRUG SCREEN, HOSP PERFORMED
Amphetamines: NOT DETECTED
Barbiturates: NOT DETECTED
Benzodiazepines: NOT DETECTED
Cocaine: NOT DETECTED
Opiates: NOT DETECTED
Tetrahydrocannabinol: NOT DETECTED

## 2020-01-27 LAB — MAGNESIUM
Magnesium: 2.4 mg/dL (ref 1.7–2.4)
Magnesium: 2.1 mg/dL (ref 1.7–2.4)

## 2020-01-27 LAB — LACTIC ACID, PLASMA
Lactic Acid, Venous: 2.7 mmol/L (ref 0.5–1.9)
Lactic Acid, Venous: 2 mmol/L (ref 0.5–1.9)

## 2020-01-27 LAB — PROTIME-INR
INR: 1 (ref 0.8–1.2)
Prothrombin Time: 13 seconds (ref 11.4–15.2)

## 2020-01-27 LAB — LIPASE, BLOOD: Lipase: 23 U/L (ref 11–51)

## 2020-01-27 LAB — PHOSPHORUS: Phosphorus: 2.1 mg/dL — ABNORMAL LOW (ref 2.5–4.6)

## 2020-01-27 LAB — SARS CORONAVIRUS 2 BY RT PCR (HOSPITAL ORDER, PERFORMED IN ~~LOC~~ HOSPITAL LAB): SARS Coronavirus 2: NEGATIVE

## 2020-01-27 LAB — AMMONIA: Ammonia: 40 umol/L — ABNORMAL HIGH (ref 9–35)

## 2020-01-27 IMAGING — DX DG CHEST 1V PORT
1 series · 1 of 1 positions shown · non-contrast
Comparison: [DATE]

CLINICAL DATA: Altered mental status.

EXAM:
PORTABLE CHEST 1 VIEW

[chest]
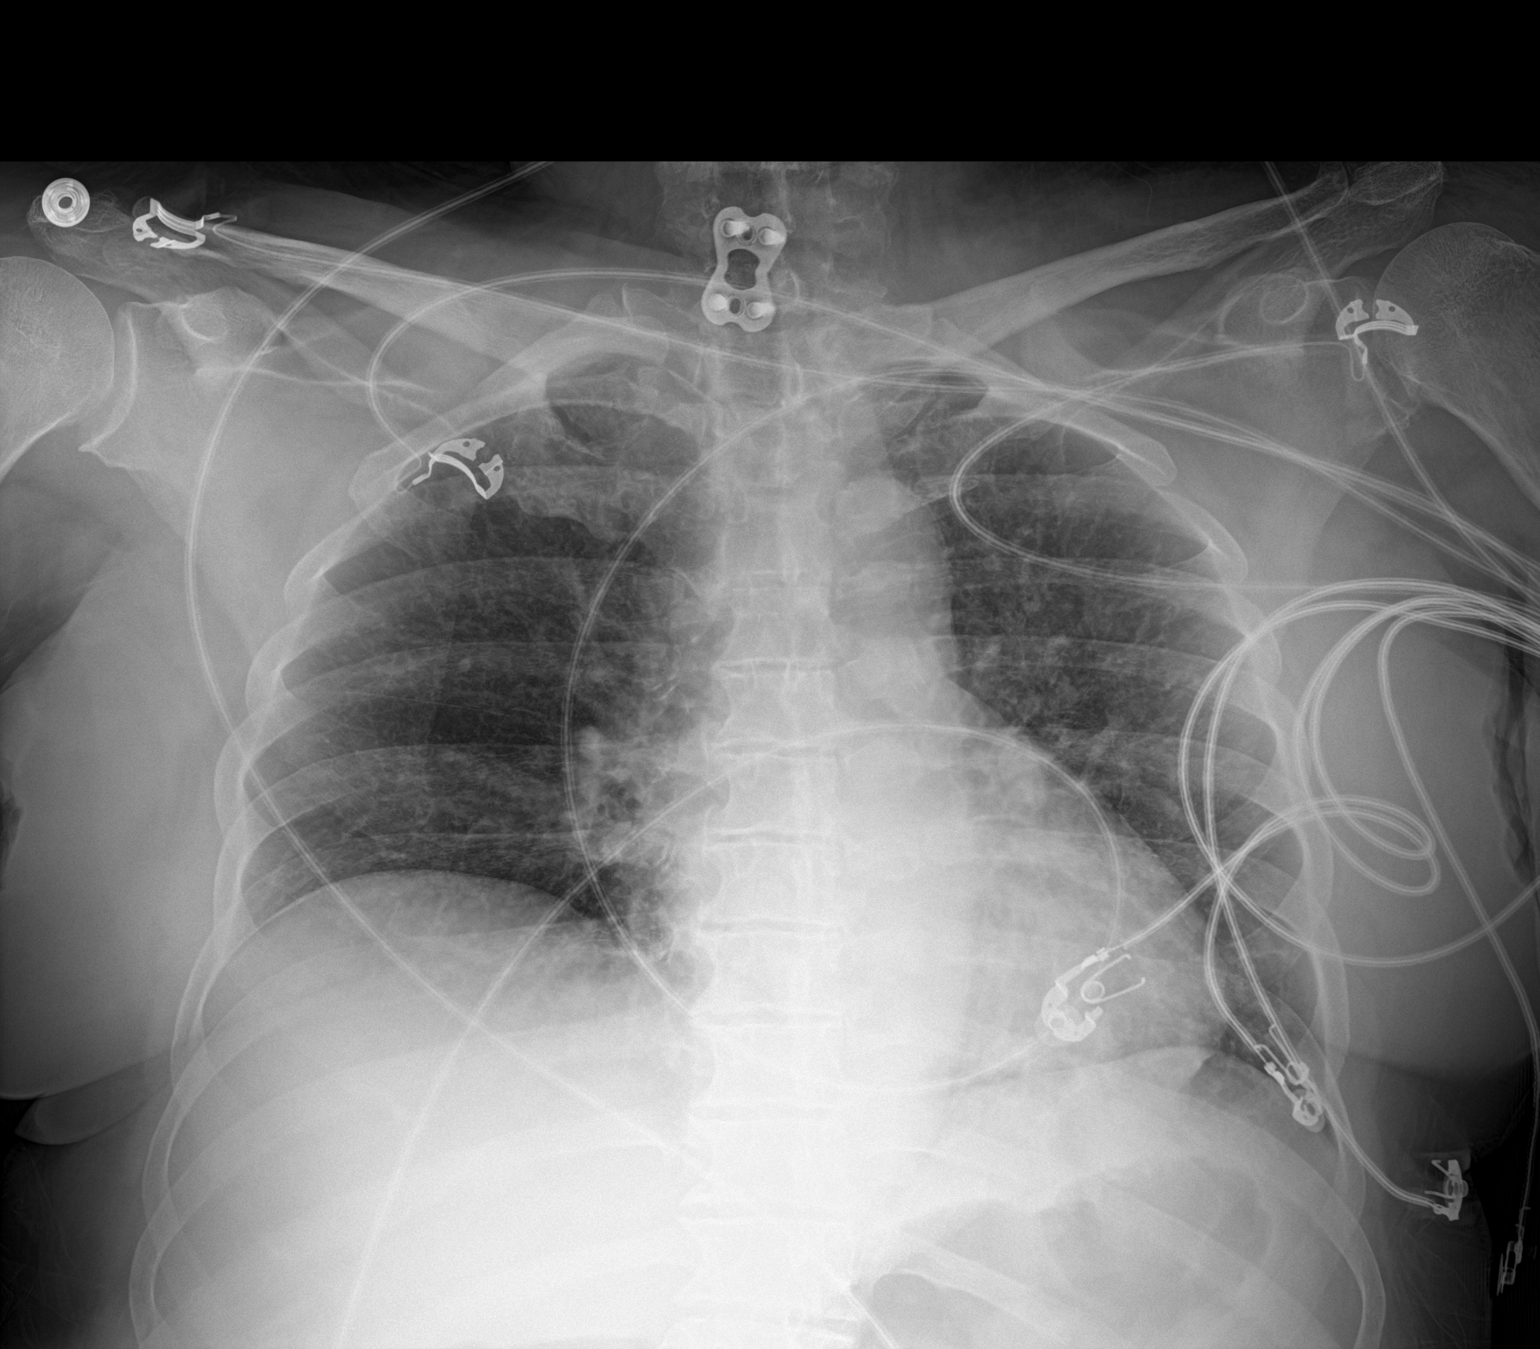

[1 of 1 positions shown; findings below may reference images not displayed]

FINDINGS: Cardiomediastinal contours and hilar structures are normal. Lungs
are clear. Minimal central vascular engorgement. No sign of pleural
effusion.

Signs of cervical spine fusion as before. No acute bone abnormality
to the extent evaluated.
IMPRESSION: Minimal central vascular engorgement. No consolidation or pleural
effusion.

## 2020-01-27 IMAGING — CT CT HEAD W/O CM
4 of 9 series · 15 of 47 positions shown, 17 images · IV contrast (Omni 300)
Comparison: [DATE]

CLINICAL DATA: Altered mental status with nausea and vomiting.

EXAM:
CT HEAD WITHOUT CONTRAST
CT ABDOMEN AND PELVIS WITH CONTRAST
TECHNIQUE: Contiguous axial images were obtained from the base of the skull
through the vertex without intravenous contrast. Multidetector CT
imaging of the abdomen and pelvis was performed during intravenous
contrast administration.
CONTRAST:  100mL OMNIPAQUE IOHEXOL 300 MG/ML  SOLN

[Series 5: a/p w/ 5mm · axial · 0.65mm/px · z∈[-895,-530]mm · 7 of 99 slices shown, 9 images]
[im 13/99  brain]
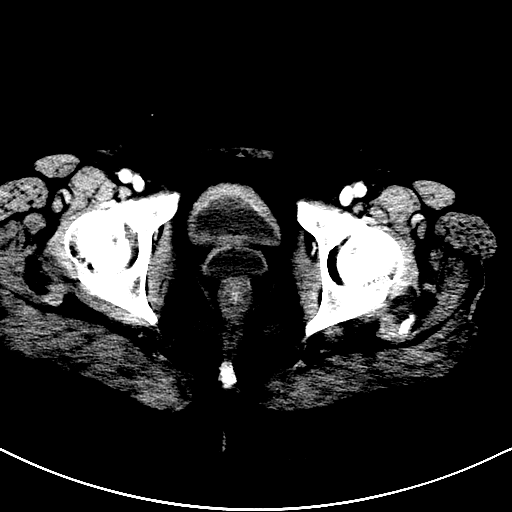
[im 13/99  bone]
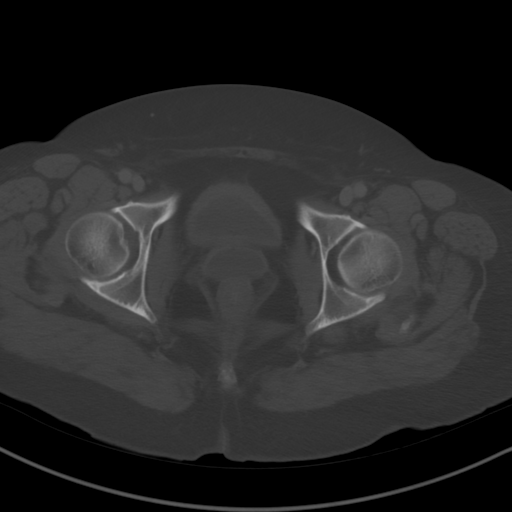
[im 25/99  brain]
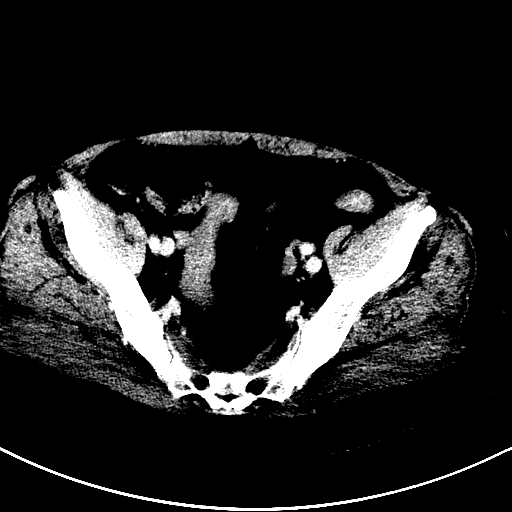
[im 37/99  brain]
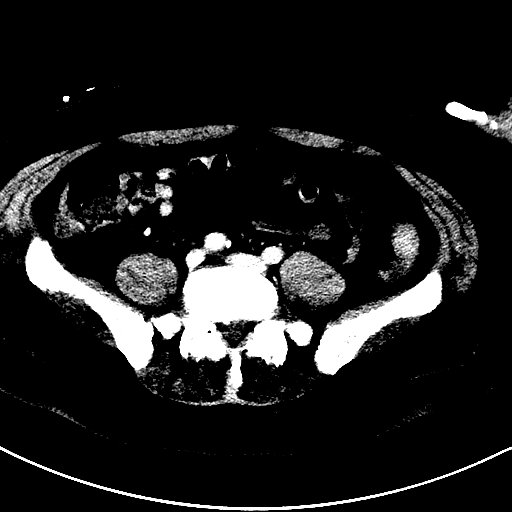
[im 50/99  brain]
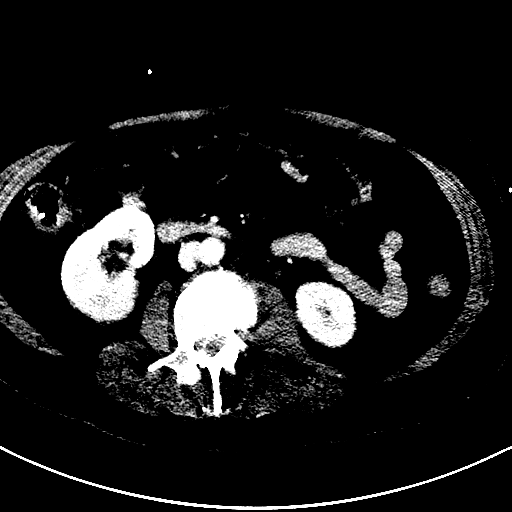
[im 62/99  brain]
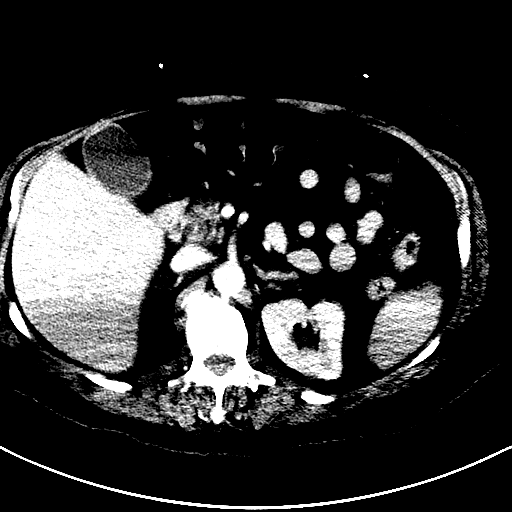
[im 62/99  bone]
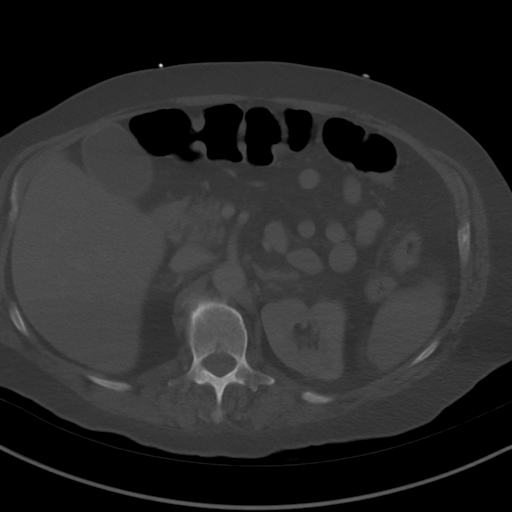
[im 74/99  brain]
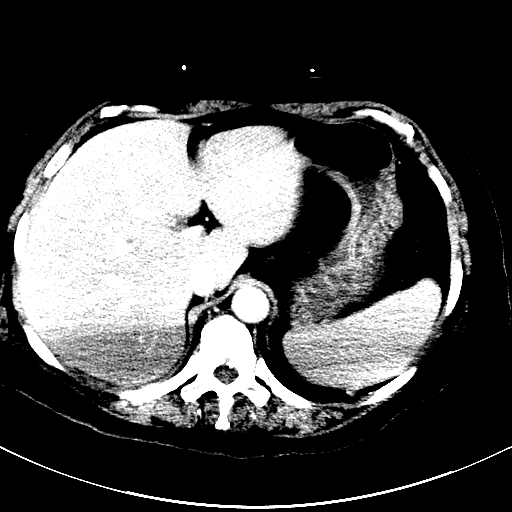
[im 86/99  brain]
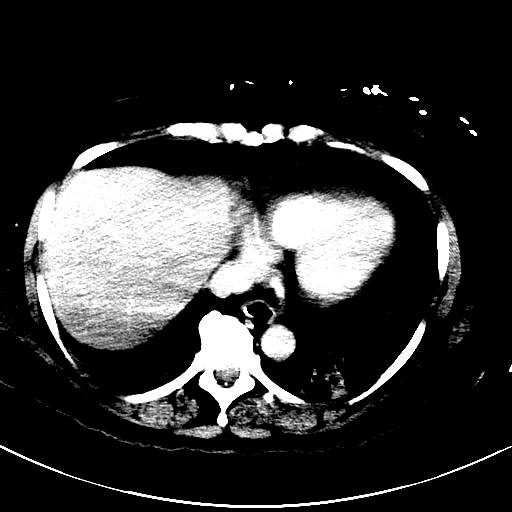

[Series 14: a/p w/ cor · coronal · 0.92mm/px · 3 of 124 slices shown]
[im 42/124  brain]
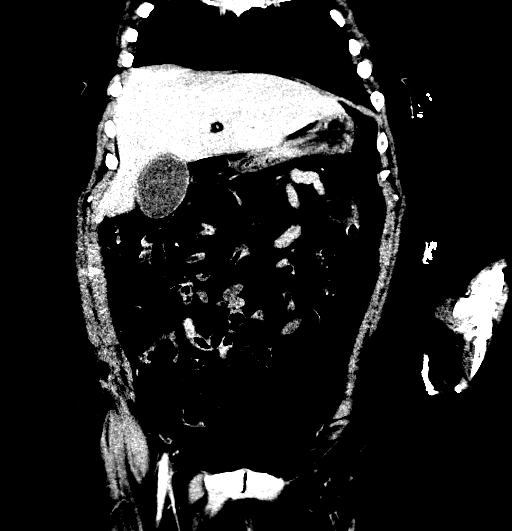
[im 55/124  brain]
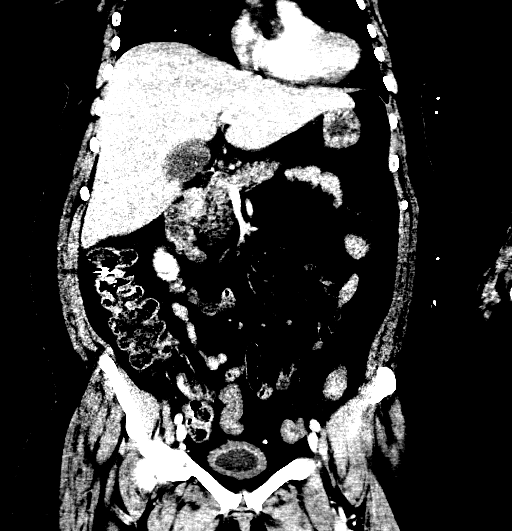
[im 69/124  brain]
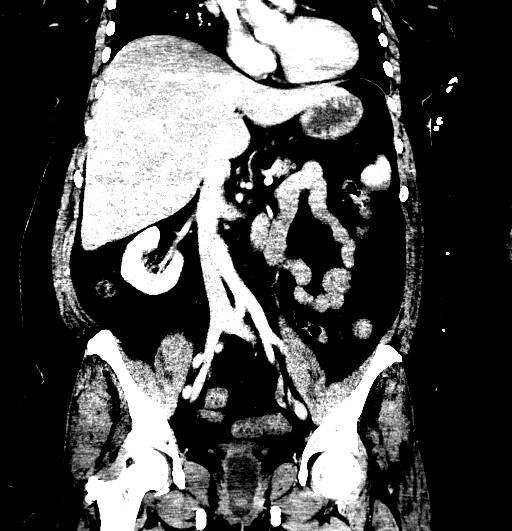

[Series 15: a/p w/ sag · sagittal · 0.69mm/px · 3 of 189 slices shown]
[im 63/189  brain]
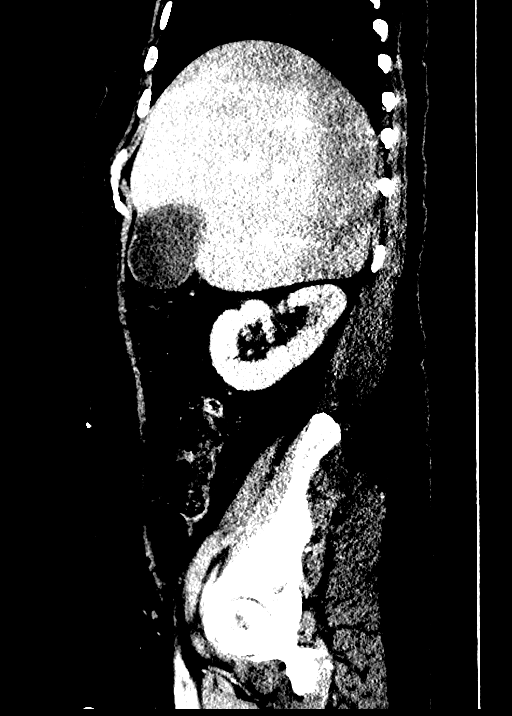
[im 95/189  brain]
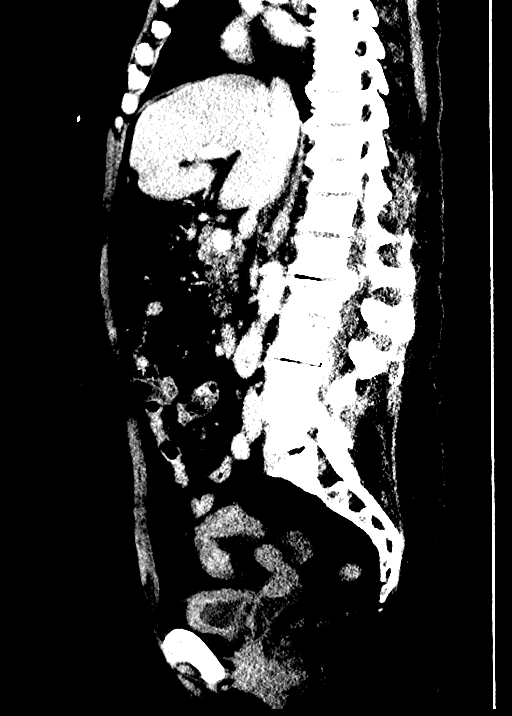
[im 126/189  brain]
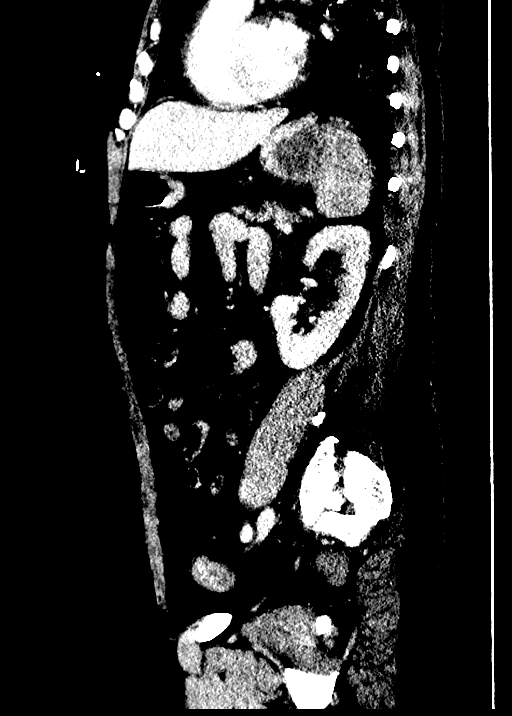

[Series 16: delay 5mm · axial · delayed · 0.65mm/px · z∈[-703,-643]mm · 2 of 38 slices shown]
[im 13/38  brain]
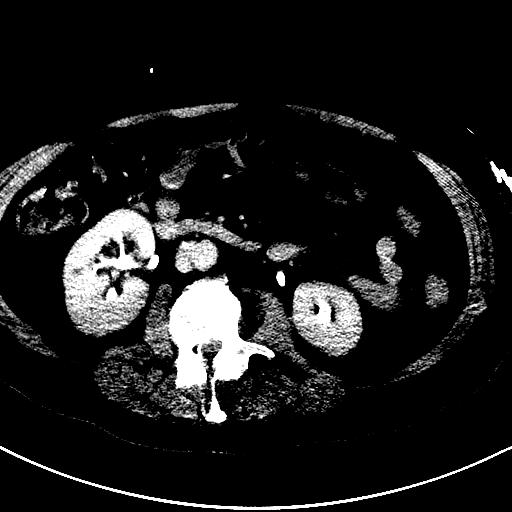
[im 25/38  brain]
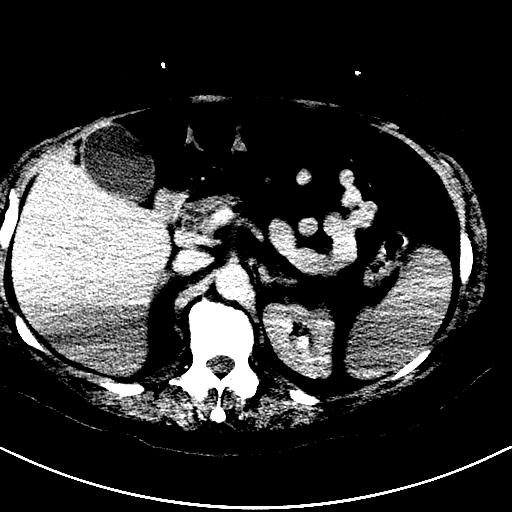

[15 of 47 positions shown; findings below may reference images not displayed]

FINDINGS: CT HEAD FINDINGS

Brain: No evidence of acute infarction, hemorrhage, hydrocephalus,
extra-axial collection or mass lesion/mass effect.

Vascular: No hyperdense vessel or unexpected calcification.

Skull: Normal. Negative for fracture or focal lesion.

Sinuses/Orbits: No acute finding.

Other: The study is limited secondary to patient motion.

CT ABDOMEN AND PELVIS FINDINGS

Hepatobiliary: No focal liver lesions are identified. Gallbladder is
normal in appearance without evidence of gallstones or gallbladder
wall thickening.

Pancreas: No mass or inflammatory process identified on this
un-enhanced exam.

Spleen: Within normal limits in size.

Adrenals/Urinary Tract: No evidence of urolithiasis or
hydronephrosis. No definite mass visualized on this un-enhanced
exam. Mild symmetric diffuse urinary bladder wall thickening is
noted.

Stomach/Bowel: No evidence of bowel dilatation. Mild diffuse
thickening of the sigmoid colon is seen which is also decompressed.
The appendix is not identified.

Vascular/Lymphatic: No pathologically enlarged lymph nodes. No
evidence of abdominal aortic aneurysm.

Reproductive: The uterus is small in size and appears to be position
to the right of midline.

Musculoskeletal: Multilevel degenerative changes seen throughout the
lumbar spine.

Other: Mild atelectasis and/or infiltrate is seen within the
bilateral lung bases, left greater than right.
IMPRESSION: 1. Limited study secondary to patient motion.
2. No acute intracranial abnormality.
3. Mild diffuse thickening of the sigmoid colon which is also
decompressed. This may represent a mild colitis.
4. Mild symmetric diffuse urinary bladder wall thickening.
Correlation with urinalysis is recommended.
5. Mild atelectasis and/or infiltrate within the bilateral lung
bases, left greater than right.

## 2020-01-27 MED ORDER — LORAZEPAM 1 MG PO TABS: 1.0000 mg | ORAL_TABLET | ORAL | Status: DC | PRN

## 2020-01-27 MED ORDER — BUPRENORPHINE HCL 2 MG SL SUBL
2.0000 mg | SUBLINGUAL_TABLET | Freq: Every day | SUBLINGUAL | Status: DC
  Administered 2020-01-27 – 2020-01-30 (×4): 2 mg via SUBLINGUAL
  Filled 2020-01-27 (×4): qty 1

## 2020-01-27 MED ORDER — SODIUM CHLORIDE 0.9 % IV SOLN
2.0000 g | Freq: Three times a day (TID) | INTRAVENOUS | Status: DC
  Administered 2020-01-28 – 2020-01-29 (×5): 2 g via INTRAVENOUS
  Filled 2020-01-27 (×6): qty 2

## 2020-01-27 MED ORDER — NON FORMULARY: 2.0000 mg | Freq: Every day | Status: DC

## 2020-01-27 MED ORDER — HYDRALAZINE HCL 20 MG/ML IJ SOLN: 5.0000 mg | Freq: Once | INTRAMUSCULAR | Status: DC

## 2020-01-27 MED ORDER — SODIUM CHLORIDE 0.9 % IV SOLN
2.0000 g | Freq: Once | INTRAVENOUS | Status: AC
  Administered 2020-01-27: 2 g via INTRAVENOUS
  Filled 2020-01-27: qty 2

## 2020-01-27 MED ORDER — LACTATED RINGERS IV SOLN: INTRAVENOUS | Status: AC

## 2020-01-27 MED ORDER — MAGNESIUM SULFATE 2 GM/50ML IV SOLN
2.0000 g | Freq: Once | INTRAVENOUS | Status: AC
  Administered 2020-01-27: 2 g via INTRAVENOUS
  Filled 2020-01-27: qty 50

## 2020-01-27 MED ORDER — LORAZEPAM 2 MG/ML IJ SOLN
1.0000 mg | INTRAMUSCULAR | Status: DC | PRN
  Administered 2020-01-27: 2 mg via INTRAVENOUS
  Filled 2020-01-27: qty 1

## 2020-01-27 MED ORDER — LORAZEPAM 2 MG/ML IJ SOLN: 1.0000 mg | INTRAMUSCULAR | Status: DC | PRN

## 2020-01-27 MED ORDER — IOHEXOL 300 MG/ML  SOLN
100.0000 mL | Freq: Once | INTRAMUSCULAR | Status: AC | PRN
  Administered 2020-01-27: 100 mL via INTRAVENOUS

## 2020-01-27 MED ORDER — SODIUM CHLORIDE 0.9 % IV BOLUS: 1000.0000 mL | Freq: Once | INTRAVENOUS | Status: DC

## 2020-01-27 MED ORDER — LORAZEPAM 2 MG/ML IJ SOLN
1.0000 mg | Freq: Once | INTRAMUSCULAR | Status: AC
  Administered 2020-01-27: 1 mg via INTRAVENOUS
  Filled 2020-01-27: qty 1

## 2020-01-27 MED ORDER — LACTATED RINGERS IV BOLUS
1000.0000 mL | Freq: Once | INTRAVENOUS | Status: AC
  Administered 2020-01-27: 1000 mL via INTRAVENOUS

## 2020-01-27 MED ORDER — ENOXAPARIN SODIUM 40 MG/0.4ML ~~LOC~~ SOLN
40.0000 mg | SUBCUTANEOUS | Status: DC
  Administered 2020-01-27 – 2020-01-29 (×3): 40 mg via SUBCUTANEOUS
  Filled 2020-01-27 (×3): qty 0.4

## 2020-01-27 MED ORDER — METRONIDAZOLE IN NACL 5-0.79 MG/ML-% IV SOLN
500.0000 mg | Freq: Once | INTRAVENOUS | Status: DC
  Filled 2020-01-27: qty 100

## 2020-01-27 MED ORDER — FOLIC ACID 5 MG/ML IJ SOLN
1.0000 mg | Freq: Every day | INTRAMUSCULAR | Status: DC
  Administered 2020-01-27 – 2020-01-29 (×3): 1 mg via INTRAVENOUS
  Filled 2020-01-27 (×3): qty 0.2

## 2020-01-27 MED ORDER — VANCOMYCIN HCL IN DEXTROSE 1-5 GM/200ML-% IV SOLN
1000.0000 mg | Freq: Two times a day (BID) | INTRAVENOUS | Status: DC
  Filled 2020-01-27: qty 200

## 2020-01-27 MED ORDER — VANCOMYCIN HCL IN DEXTROSE 1-5 GM/200ML-% IV SOLN: 1000.0000 mg | Freq: Once | INTRAVENOUS | Status: DC

## 2020-01-27 MED ORDER — POTASSIUM CHLORIDE 10 MEQ/100ML IV SOLN
10.0000 meq | INTRAVENOUS | Status: AC
  Administered 2020-01-27: 10 meq via INTRAVENOUS
  Filled 2020-01-27 (×2): qty 100

## 2020-01-27 MED ORDER — THIAMINE HCL 100 MG/ML IJ SOLN
100.0000 mg | Freq: Every day | INTRAMUSCULAR | Status: DC
  Administered 2020-01-27 – 2020-01-29 (×3): 100 mg via INTRAVENOUS
  Filled 2020-01-27 (×3): qty 2

## 2020-01-27 MED ORDER — POTASSIUM CHLORIDE 10 MEQ/100ML IV SOLN
10.0000 meq | INTRAVENOUS | Status: AC
  Administered 2020-01-27 (×2): 10 meq via INTRAVENOUS
  Filled 2020-01-27 (×2): qty 100

## 2020-01-27 NOTE — ED Provider Notes (Signed)
[facility] EMERGENCY DEPARTMENT Provider Note   CSN: [id number] Arrival date & time: 01/27/20  2585     History Chief Complaint  Patient presents with  . Abdominal Pain  . Emesis    Stephanie Carrillo is a 64 y.o. female with chronic pain syndrome and anxiety who presents with N/V/D and AMS. Her friend is at bedside and provides history. The patient is acutely altered and unable to provide a history. Her friend states that the patient told her she wasn't feeling well yesterday. She started to have multiple episodes of nausea and bilious emesis. She was also having multiple episodes of diarrhea. Today her friend went to check on her and found her on the floor and she was confused therefore EMS was called. She states that she gets this way every couple months but doesn't know why. She's been here for about 2 years. Her friend states emesis has been bilious however in triage staff report emesis appeared to be coffee grounds. Of note her friend states she has a tendency to over take meds, especially Tylenol. She was admitted for possible narcotic over dose last year.  LEVEL 5 caveat due to pt distress and AMS  HPI     Past Medical History:  Diagnosis Date  . Anxiety   . Depression   . Hypertension   . Insomnia   . Meningitis   . Seizures (HCC)   . Substance abuse [facility])     Patient Active Problem List   Diagnosis Date Noted  . Anxiety and depression 09/23/2018  . Insomnia 09/23/2018  . Chronic pain syndrome 09/23/2018    Past Surgical History:  Procedure Laterality Date  . BACK SURGERY    . CERVICAL DISC SURGERY       OB History   No obstetric history on file.     Family History  Problem Relation Age of Onset  . Multiple sclerosis Mother   . Cancer Father     Social History   Tobacco Use  . Smoking status: Former Games developer  . Smokeless tobacco: Never Used  Vaping Use  . Vaping Use: Never used  Substance Use Topics  . Alcohol use: Not Currently    . Drug use: Not Currently    Comment: opiates -clean 6 months    Home Medications Prior to Admission medications   Medication Sig Start Date End Date Taking? Authorizing Provider  amLODipine (NORVASC) 5 MG tablet Take 1 tablet (5 mg total) by mouth daily. 09/25/18   [place], Alvira Philips, DO  Buprenorphine HCl (BELBUCA) 900 MCG FILM Place 900 mcg inside cheek 2 (two) times daily.    [provider]  cimetidine (TAGAMET) 200 MG tablet Take 200 mg by mouth daily as needed (upset stomach and indigestion).     [provider]  clonazePAM (KLONOPIN) 0.5 MG tablet Take 0.5 mg by mouth 2 (two) times daily as needed for anxiety.    [provider]  diphenhydrAMINE (BENADRYL) 25 MG tablet Take 25 mg by mouth at bedtime as needed for sleep.    [provider]  FLUoxetine (PROZAC) 40 MG capsule Take 40 mg by mouth daily.    [provider]  gabapentin (NEURONTIN) 800 MG tablet Take 800 mg by mouth 3 (three) times daily.    [provider]  HYDROcodone-acetaminophen (NORCO/VICODIN) 5-325 MG tablet Take 1 tablet by mouth 2 (two) times daily as needed for moderate pain.    [provider]  ibuprofen (ADVIL,MOTRIN) 100 MG tablet  Take 200 mg by mouth 2 (two) times daily as needed for pain.    [provider]  ondansetron (ZOFRAN) 4 MG tablet Take 1 tablet (4 mg total) by mouth every 6 (six) hours as needed for nausea. 09/25/18   [place], Eric J, DO  tiZANidine (ZANAFLEX) 4 MG capsule Take 4 mg by mouth 3 (three) times daily.    [provider]  traZODone (DESYREL) 150 MG tablet Take 150 mg by mouth at bedtime.    [provider]    Allergies    Ciprofloxacin and Penicillins  Review of Systems   Review of Systems  Unable to perform ROS: Mental status change    Physical Exam Updated Vital Signs BP (!) 167/124   Pulse (!) 137   Resp 14   SpO2 91%   Physical Exam Vitals and nursing note reviewed.  Constitutional:       General: She is in acute distress.     Appearance: She is well-developed. She is obese. She is ill-appearing.     Comments: Moaning and rolling back and forth on the stretcher  HENT:     Head: Normocephalic and atraumatic.     Mouth/Throat:     Mouth: Mucous membranes are dry.  Eyes:     General: No scleral icterus.       Right eye: No discharge.        Left eye: No discharge.     Conjunctiva/sclera: Conjunctivae normal.     Pupils: Pupils are equal, round, and reactive to light.  Cardiovascular:     Rate and Rhythm: Tachycardia present.     Pulses: Normal pulses.  Pulmonary:     Effort: Pulmonary effort is normal. No respiratory distress.     Breath sounds: Normal breath sounds.  Abdominal:     General: Bowel sounds are normal. There is no distension.     Palpations: Abdomen is soft.     Tenderness: There is no abdominal tenderness.     Comments: Mottled skin  Musculoskeletal:     Cervical back: Normal range of motion.  Skin:    General: Skin is warm and dry.  Neurological:     Mental Status: She is alert. She is disoriented.  Psychiatric:     Comments: Restless     ED Results / Procedures / Treatments   Labs (all labs ordered are listed, but only abnormal results are displayed) Labs Reviewed  COMPREHENSIVE METABOLIC PANEL - Abnormal; Notable for the following components:      Result Value   Potassium 2.6 (*)    CO2 21 (*)    Glucose, Bld 156 (*)    Total Protein 9.0 (*)    AST 52 (*)    Anion gap 19 (*)    All other components within normal limits  CBC WITH DIFFERENTIAL/PLATELET - Abnormal; Notable for the following components:   WBC 26.0 (*)    RBC 5.70 (*)    Hemoglobin 18.1 (*)    HCT 53.2 (*)    Platelets 419 (*)    Neutro Abs 22.4 (*)    Monocytes Absolute 2.3 (*)    All other components within normal limits  LACTIC ACID, PLASMA - Abnormal; Notable for the following components:   Lactic Acid, Venous 2.7 (*)    All other components within normal  limits  LACTIC ACID, PLASMA - Abnormal; Notable for the following components:   Lactic Acid, Venous 2.0 (*)    All other components within normal limits  ACETAMINOPHEN LEVEL - Abnormal; Notable for the following components:   Acetaminophen (Tylenol), Serum <10 (*)    All other components within normal limits  AMMONIA - Abnormal; Notable for the following components:   Ammonia 40 (*)    All other components within normal limits  I-STAT CHEM 8, ED - Abnormal; Notable for the following components:   Potassium 2.5 (*)    Glucose, Bld 151 (*)    Calcium, Ion 1.05 (*)    Hemoglobin 18.0 (*)    HCT 53.0 (*)    All other components within normal limits  TROPONIN I (HIGH SENSITIVITY) - Abnormal; Notable for the following components:   Troponin I (High Sensitivity) 258 (*)    All other components within normal limits  TROPONIN I (HIGH SENSITIVITY) - Abnormal; Notable for the following components:   Troponin I (High Sensitivity) 242 (*)    All other components within normal limits  SARS CORONAVIRUS 2 BY RT PCR (HOSPITAL ORDER, [facility])  CULTURE, BLOOD (ROUTINE X 2)  CULTURE, BLOOD (ROUTINE X 2)  LIPASE, BLOOD  ETHANOL  MAGNESIUM  URINALYSIS, ROUTINE W REFLEX MICROSCOPIC  RAPID URINE DRUG SCREEN, HOSP PERFORMED    EKG EKG Interpretation  Date/Time:  Friday January 27 2020 10:15:33 EDT Ventricular Rate:  137 PR Interval:    QRS Duration: 74 QT Interval:  391 QTC Calculation: 591 R Axis:   62 Text Interpretation: Sinus tachycardia Probable left atrial enlargement RSR' in V1 or V2, right VCD or RVH Borderline ST depression, lateral leads Prolonged QT interval Confirmed by Quintella Reichert [phone]) on 01/27/2020 10:49:47 AM   Radiology DG Chest Port 1 View  Result Date: 01/27/2020 CLINICAL DATA:  Altered mental status. EXAM: PORTABLE CHEST 1 VIEW COMPARISON:  09/22/2018 FINDINGS: Cardiomediastinal contours and hilar structures are normal. Lungs are clear.  Minimal central vascular engorgement. No sign of pleural effusion. Signs of cervical spine fusion as before. No acute bone abnormality to the extent evaluated. IMPRESSION: Minimal central vascular engorgement. No consolidation or pleural effusion. Electronically Signed   By: Zetta Bills M.D.   On: 01/27/2020 12:46    Procedures Procedures (including critical care time)  CRITICAL CARE Performed by: Recardo Evangelist   Total critical care time: 35 minutes  Critical care time was exclusive of separately billable procedures and treating other patients.  Critical care was necessary to treat or prevent imminent or life-threatening deterioration.  Critical care was time spent personally by me on the following activities: development of treatment plan with patient and/or surrogate as well as nursing, discussions with consultants, evaluation of patient's response to treatment, examination of patient, obtaining history from patient or surrogate, ordering and performing treatments and interventions, ordering and review of laboratory studies, ordering and review of radiographic studies, pulse oximetry and re-evaluation of patient's condition.   Medications Ordered in ED Medications  lactated ringers bolus 1,000 mL (1,000 mLs Intravenous New Bag/Given 01/27/20 1046)  magnesium sulfate IVPB 2 g 50 mL (2 g Intravenous New Bag/Given 01/27/20 1228)  lactated ringers bolus 1,000 mL (1,000 mLs Intravenous New Bag/Given 01/27/20 1324)  potassium chloride 10 mEq in 100 mL IVPB (10 mEq Intravenous New Bag/Given 01/27/20 1340)  LORazepam (ATIVAN) injection 1 mg (1 mg Intravenous Given 01/27/20 1357)  iohexol (OMNIPAQUE) 300 MG/ML solution 100 mL (100 mLs Intravenous Contrast Given 01/27/20 1453)    ED Course  I have reviewed the triage vital signs and the nursing notes.  Pertinent labs & imaging results that were  available during my care of the patient were reviewed by me and considered in my medical decision  making (see chart for details).  Clinical Course as of Jan 27 1552  Fri Jan 27, 2020  1545 Watcher. N/V/D. AMS. Chronic pain. Encephalopathy. Hx of same. Hypokalemia. Step down likely.    [AL]  1551 Unassigned   [AL]    Clinical Course User Index [AL] Janeece Fitting, MD   64 year old presents with altered mental status and reported N/V/D at home. She is ill appearing on exam. She is significantly tachycardic and dehydrated appearing. BP is elevated. There is no hypoxia. She is not able to give a history due to AMS but is alert. Lungs are CTA. Abdomen is soft and non-tender. Shared visit with Dr. Madilyn Hook. Will order labs, imaging, start IVF  I-stat chem 8 shows hypokalemia of 2.5. Will give Mag and K.   CBC shows leukocytosis of 26 and elevated hgb (18) as well as platelets which could be from dehydration. Her kidney function is normal. Trop is 242 which is likely from demand ischemia. Lactate is 2.7. EKG shows sinus tachycardia with prolonged QT. CXR is negative.  At shift change abdominal and head imaging are pending. Care signed out to Dr. Michel Harrow. Anticipate admission.   MDM Rules/Calculators/A&P                           Final Clinical Impression(s) / ED Diagnoses Final diagnoses:  Altered mental status, unspecified altered mental status type    Rx / DC Orders ED Discharge Orders    None       Bethel Born, PA-C 01/27/20 1601    Tilden Fossa, MD 01/30/20 [phone]

## 2020-01-27 NOTE — ED Triage Notes (Signed)
Per [facility] pt from home w/abd pain and vomiting dark coffee ground emesis x3 days, unable to eat or drink x3 days, pt pale, skin dry, unable to obtain IV access  HR 130 BP 210/120 no hx of HTN  RR 22 CBG 141

## 2020-01-27 NOTE — Progress Notes (Signed)
Pharmacy Antibiotic Note  Stephanie Carrillo is a 64 y.o. female admitted on 01/27/2020 with sepsis.  Pharmacy has been consulted for Aztreonam and vancomycin dosing. LA 2, WBC 26, SCr 0.8  Plan: -Aztreonam 2 gm IV Q 8 hours -Start vancomycin 1 gm IV Q 2 hours -Monitor CBC, renal fx, cultures and clinical progress -VT as indicated     Temp (24hrs), Avg:99.8 F (37.7 C), Min:99.8 F (37.7 C), Max:99.8 F (37.7 C)  Recent Labs  Lab 01/27/20 1110 01/27/20 1141 01/27/20 1355  WBC 26.0*  --   --   CREATININE 0.94 0.80  --   LATICACIDVEN 2.7*  --  2.0*    CrCl cannot be calculated (Unknown ideal weight.).    Allergies  Allergen Reactions  . Ciprofloxacin Shortness Of Breath and Other (See Comments)    Respiratory arrest  . Penicillins Shortness Of Breath and Rash    Did it involve swelling of the face/tongue/throat, SOB, or low BP? y Did it involve sudden or severe rash/hives, skin peeling, or any reaction on the inside of your mouth or nose? y Did you need to seek medical attention at a hospital or doctor's office? n When did it last happen?1985 If all above answers are "NO", may proceed with cephalosporin use.  Marland Kitchen Oxycodone Other (See Comments)    Patient in recovery process.      Thank you for allowing pharmacy to be a part of this patient's care.  Vinnie Level, PharmD., BCPS, BCCCP Clinical Pharmacist Clinical phone for 01/27/20 until 11:30pm: [phone] If after 11:30pm, please refer to [facility] for unit-specific pharmacist

## 2020-01-27 NOTE — H&P (Signed)
Date: 01/27/2020               Patient Name:  Stephanie Carrillo MRN: [id number]  DOB: Oct 08, 1955 Age / Sex: 64 y.o., female   PCP: Patient, No Pcp Per         Medical Service: Internal Medicine Teaching Service         Attending Physician: Dr. Benjiman Core, MD    First Contact: Marchia Bond, DO, Sharlet Salina Pager: [facility] [phone])  Second Contact: Cleaster Corin, DO, Jaimie PagerDuane Lope [phone])       After Hours (After 5p/  First Contact Pager: [phone]  weekends / holidays): Second Contact Pager: [phone]   Chief Complaint: AMS  History of Present Illness: Stephanie Carrillo is a 65 y.o. female with a pertinent PMH of opioid use now on buprenorphine, seizures (previously on phenobarbitol years ago), depression, and anxiety, who presented with a chief complaint of acute onset AMS, nausea, vomiting and diarrhea for at least the past 24 hours.  She is accompanied by her best friend who is her emergency contact. She is talking but cannot remember any of the events that led up to this. The patients friend states that she call last night stating that she did feel well and had been experiencing nausea, vomiting, and diarrhea. The next day, her friend found her on the ground unresponsive. On interview today that patient cannot remember any events leading up to this hospitalization. She denies recent illness, recent contacts, abdominal pain, shortness of breath, chest pain, but does have some abdominal pain particularly in the right lower quadrant.   Currently she is not sure how she is feeling but states she feels very hazy.  Per her friend she has been in recovery for two years and is on subetex. She does not drink anymore. Her friend thinks she has been out of her klonipin for two weeks. She also states she takes a lot of tylenol. When her legs hurt she takes tizanidine and tylenol on top of it. She'll take 500 mg tablets and will take 2 every six hours when she's hurting.    She gets her opioids from  [facility] from [place]  Her friend says she had a UTI six month ago but she never took or received the antibiotics. She has not been having sypmptoms recently since then.      Lab Orders     Blood culture (routine x 2)     SARS Coronavirus 2 by RT PCR (hospital order, performed in [facility] hospital lab) Nasopharyngeal Nasopharyngeal Swab     Comprehensive metabolic panel     CBC with Differential     Lipase, blood     Urinalysis, Routine w reflex microscopic     Ethanol     Urine rapid drug screen (hosp performed)     Lactic acid, plasma     Acetaminophen level     Ammonia     Magnesium     I-stat chem 8, ED (not at [facility] or [facility])   Meds:  Current Meds  Medication Sig  . buprenorphine (SUBUTEX) 2 MG SUBL SL tablet Place 2 mg under the tongue every 4 (four) hours as needed for anxiety. LF on 01-05-20 # 180 DS 30 12 mg dose daily  . clonazePAM (KLONOPIN) 0.5 MG tablet Take 0.5 mg by mouth 2 (two) times daily as needed for anxiety.  . diphenhydrAMINE (BENADRYL) 25 MG tablet Take 25 mg by mouth at bedtime as needed for sleep.  Marland Kitchen  FLUoxetine (PROZAC) 40 MG capsule Take 40 mg by mouth daily.  Marland Kitchen. gabapentin (NEURONTIN) 800 MG tablet Take 800 mg by mouth 3 (three) times daily.  Marland Kitchen. ibuprofen (ADVIL,MOTRIN) 100 MG tablet Take 200 mg by mouth 2 (two) times daily as needed for pain.  Marland Kitchen. tiZANidine (ZANAFLEX) 4 MG capsule Take 4 mg by mouth 3 (three) times daily.    Social:  Social History   Socioeconomic History  . Marital status: Divorced    Spouse name: Not on file  . Number of children: Not on file  . Years of education: Not on file  . Highest education level: Not on file  Occupational History  . Not on file  Tobacco Use  . Smoking status: Former Games developermoker  . Smokeless tobacco: Never Used  Vaping Use  . Vaping Use: Never used  Substance and Sexual Activity  . Alcohol use: Not Currently  . Drug use: Not Currently    Comment: opiates -clean 6 months  . Sexual  activity: Not on file  Other Topics Concern  . Not on file  Social History Narrative  . Not on file   Family History:  Family History  Problem Relation Age of Onset  . Multiple sclerosis Mother   . Cancer Father     Allergies: Allergies as of 01/27/2020 - Review Complete 09/22/2018  Allergen Reaction Noted  . Ciprofloxacin Shortness Of Breath and Other (See Comments) 09/22/2018  . Penicillins Shortness Of Breath and Rash 09/22/2018  . Oxycodone Other (See Comments) 01/27/2020   Past Medical History:  Diagnosis Date  . Anxiety   . Depression   . Hypertension   . Insomnia   . Meningitis   . Seizures (HCC)   . Substance abuse (HCC)      Review of Systems: A complete ROS was negative except as per HPI.   Physical Exam: Blood pressure (!) 187/104, pulse (!) 125, temperature 99.8 F (37.7 C), temperature source Rectal, resp. rate (!) 26, SpO2 94 %. Physical Exam Constitutional:      Appearance: She is ill-appearing and diaphoretic.  HENT:     Head: Normocephalic and atraumatic.  Eyes:     Extraocular Movements: Extraocular movements intact.     Comments: pupils dilated   Cardiovascular:     Rate and Rhythm: Tachycardia present.     Heart sounds: Normal heart sounds.  Pulmonary:     Effort: Pulmonary effort is normal. No respiratory distress.  Abdominal:     General: There is no distension.     Tenderness: There is abdominal tenderness in the right lower quadrant.  Skin:    General: Skin is warm.  Neurological:     Mental Status: She is alert. She is disoriented and confused.     Cranial Nerves: Dysarthria present.  Psychiatric:        Mood and Affect: Mood is anxious.     Labs: CBC    Component Value Date/Time   WBC 26.0 (H) 01/27/2020 1110   RBC 5.70 (H) 01/27/2020 1110   HGB 18.0 (H) 01/27/2020 1141   HCT 53.0 (H) 01/27/2020 1141   PLT 419 (H) 01/27/2020 1110   MCV 93.3 01/27/2020 1110   MCH 31.8 01/27/2020 1110   MCHC 34.0 01/27/2020 1110   RDW  13.4 01/27/2020 1110   LYMPHSABS 1.3 01/27/2020 1110   MONOABS 2.3 (H) 01/27/2020 1110   EOSABS 0.0 01/27/2020 1110   BASOSABS 0.0 01/27/2020 1110     CMP     Component Value  Date/Time   NA 142 01/27/2020 1141   K 2.5 (LL) 01/27/2020 1141   CL 99 01/27/2020 1141   CO2 21 (L) 01/27/2020 1110   GLUCOSE 151 (H) 01/27/2020 1141   BUN 21 01/27/2020 1141   CREATININE 0.80 01/27/2020 1141   CALCIUM 10.0 01/27/2020 1110   PROT 9.0 (H) 01/27/2020 1110   ALBUMIN 4.1 01/27/2020 1110   AST 52 (H) 01/27/2020 1110   ALT 22 01/27/2020 1110   ALKPHOS 96 01/27/2020 1110   BILITOT 0.9 01/27/2020 1110   GFRNONAA >60 01/27/2020 1110   GFRAA >60 01/27/2020 1110    Imaging: CT Head Wo Contrast  Result Date: 01/27/2020 CLINICAL DATA:  Altered mental status with nausea and vomiting. EXAM: CT HEAD WITHOUT CONTRAST CT ABDOMEN AND PELVIS WITH CONTRAST TECHNIQUE: Contiguous axial images were obtained from the base of the skull through the vertex without intravenous contrast. Multidetector CT imaging of the abdomen and pelvis was performed during intravenous contrast administration. CONTRAST:  117mL OMNIPAQUE IOHEXOL 300 MG/ML  SOLN COMPARISON:  September 22, 2018 FINDINGS: CT HEAD FINDINGS Brain: No evidence of acute infarction, hemorrhage, hydrocephalus, extra-axial collection or mass lesion/mass effect. Vascular: No hyperdense vessel or unexpected calcification. Skull: Normal. Negative for fracture or focal lesion. Sinuses/Orbits: No acute finding. Other: The study is limited secondary to patient motion. CT ABDOMEN AND PELVIS FINDINGS Hepatobiliary: No focal liver lesions are identified. Gallbladder is normal in appearance without evidence of gallstones or gallbladder wall thickening. Pancreas: No mass or inflammatory process identified on this un-enhanced exam. Spleen: Within normal limits in size. Adrenals/Urinary Tract: No evidence of urolithiasis or hydronephrosis. No definite mass visualized on this  un-enhanced exam. Mild symmetric diffuse urinary bladder wall thickening is noted. Stomach/Bowel: No evidence of bowel dilatation. Mild diffuse thickening of the sigmoid colon is seen which is also decompressed. The appendix is not identified. Vascular/Lymphatic: No pathologically enlarged lymph nodes. No evidence of abdominal aortic aneurysm. Reproductive: The uterus is small in size and appears to be position to the right of midline. Musculoskeletal: Multilevel degenerative changes seen throughout the lumbar spine. Other: Mild atelectasis and/or infiltrate is seen within the bilateral lung bases, left greater than right. IMPRESSION: 1. Limited study secondary to patient motion. 2. No acute intracranial abnormality. 3. Mild diffuse thickening of the sigmoid colon which is also decompressed. This may represent a mild colitis. 4. Mild symmetric diffuse urinary bladder wall thickening. Correlation with urinalysis is recommended. 5. Mild atelectasis and/or infiltrate within the bilateral lung bases, left greater than right. Electronically Signed   By: Virgina Norfolk M.D.   On: 01/27/2020 15:52   CT ABDOMEN PELVIS W CONTRAST  Result Date: 01/27/2020 CLINICAL DATA:  Altered mental status with nausea and vomiting. EXAM: CT HEAD CT ABDOMEN AND PELVIS WITH CONTRAST TECHNIQUE: Multidetector CT imaging of the abdomen and pelvis was performed using the standard protocol following bolus administration of intravenous contrast. CONTRAST:  170mL OMNIPAQUE IOHEXOL 300 MG/ML  SOLN COMPARISON:  None. FINDINGS: CT HEAD FINDINGS Brain: No evidence of acute infarction, hemorrhage, hydrocephalus, extra-axial collection or mass lesion/mass effect. Vascular: No hyperdense vessel or unexpected calcification. Skull: Normal. Negative for fracture or focal lesion. Sinuses/Orbits: No acute finding. Other: The study is limited secondary to patient motion. CT ABDOMEN AND PELVIS FINDINGS Hepatobiliary: No focal liver lesions are identified.  Gallbladder is normal in appearance without evidence of gallstones or gallbladder wall thickening. Pancreas: No mass or inflammatory process identified on this un-enhanced exam. Spleen: Within normal limits in size. Adrenals/Urinary Tract: No  evidence of urolithiasis or hydronephrosis. No definite mass visualized on this un-enhanced exam. Mild symmetric diffuse urinary bladder wall thickening is noted. Stomach/Bowel: No evidence of bowel dilatation. Mild diffuse thickening of the sigmoid colon is seen which is also decompressed. The appendix is not identified. Vascular/Lymphatic: No pathologically enlarged lymph nodes. No evidence of abdominal aortic aneurysm. Reproductive: The uterus is small in size and appears to be position to the right of midline. Musculoskeletal: Multilevel degenerative changes seen throughout the lumbar spine. Other: Mild atelectasis and/or infiltrate is seen within the bilateral lung bases, left greater than right. IMPRESSION: 1. Limited study secondary to patient motion. 2. No acute intracranial abnormality. 3. Mild diffuse thickening of the sigmoid colon which is also decompressed. This may represent a mild colitis. 4. Mild symmetric diffuse urinary bladder wall thickening. Correlation with urinalysis is recommended. 5. Mild atelectasis and/or infiltrate within the bilateral lung bases, left greater than right. Electronically Signed   By: Aram Candela M.D.   On: 01/27/2020 15:54   DG Chest Port 1 View  Result Date: 01/27/2020 CLINICAL DATA:  Altered mental status. EXAM: PORTABLE CHEST 1 VIEW COMPARISON:  09/22/2018 FINDINGS: Cardiomediastinal contours and hilar structures are normal. Lungs are clear. Minimal central vascular engorgement. No sign of pleural effusion. Signs of cervical spine fusion as before. No acute bone abnormality to the extent evaluated. IMPRESSION: Minimal central vascular engorgement. No consolidation or pleural effusion. Electronically Signed   By: Donzetta Kohut M.D.   On: 01/27/2020 12:46    EKG: personally reviewed my interpretation is Sinus tachycardia, Probable left atrial enlargement, RSR' in V1 or V2, right VCD or RVH, Borderline ST depression, lateral leads, Prolonged QT interval  Assessment & Plan by Problem: Active Problems:   * No active hospital problems. *   Stephanie Carrillo is a 87 y.o. with pertinent PMH of  opioid use now on buprenorphine, seizures (previously on phenobarbitol years ago), depression, and anxiety, who presented with a chief complaint of acute onset AMS, nausea, vomiting and diarrhea for at least the past 24 hours and admit for further evaluation and management on hospital day 0  #Acute encephalopathy  Patient presented with altered mental status which has slowly improved during her time in the ED. On evaluation the patient is alert and oriented to self but very anxious and having a difficult time concentration. She denies any memory of the events leading up to her hospitalization. Patients friends states that she started feeling bad yesterday evening with n/v/d and was found unresponsive on the ground in her apartment this morning. Her friend states that she is 2 years clean form opioid use, but patient had a similar hospitalization back in 09/2018 in which she required narcan drip for AMS. Patient does have unspecified history of seizures without evidence of antiepileptic on med rec. AMS could be 2/2 to postictal state, paticularlly with Hgb found in urine without evidence of AKI. Patient has a elevated WBC concerning for infection with CT abdomen showing possible colitis vs UTI as the patient admits to dysuria. Considering she is experiencing diarrhea , dialate pupils, diffuse muscle pain, diaphoresis, and anxiety, I am highly suspicious that she could be experiencing opioid withdrawal. It is difficult to completely r/o benzo withdrawal as her UDS was negative. There are no obvious focal neurologic deficits other than possible  dysarthria vs palilalia withperiods of jumbled, incoherent speech. Differential includes seizure, substance-use, opioid/benzo withdrawal, infection (UTI/colitis), iatrogenic/medication overdose, Wernicke's, serotonin syndrome, vs stroke. - EEG pending and CIWA with ativan  PRN q 4 hours, CK pending - BCx and UCx, Broad spectrum antibiotics with Vanc, Metronidazole, and Aztreonam (PCN allergy)  - TSH, salicylate and LA pending - Supplement thiamine and folic acid - MRI of brain pending   #Leukocytosis: #Tachycardia: Patient has a leukocytosis, tachycardia and CT imaging concerning for infection 2/2 to colitis vs UTI - Continue broad spectrum AB with vancomycin, metro, and aztreonam - BCx and UCx pending  #Hypokalemia: Patient has received 130 mEq of potassium.  - Repeat CMP tonight and replete as needed. - Magnesium repleted as well.  #Opioid-use disorder: - Patient was restarted on her home subutex 2 mg sublingual daily  Treponemia: - Initial troponin level of 242 and repeat of 258. - Likely secondary to rate related ischemia - She denies chest pain and difficult to discern ischemic changes on EKG.   Diet: NPO VTE: Enoxaparin IVF: LR,100cc/hr Code: Full  Prior to Admission Living Arrangement: Home, living alone Anticipated Discharge Location: unknown Barriers to Discharge: further evaluation   Dispo: Admit patient to Inpatient with expected length of stay greater than 2 midnights.  Signed: Dellia Cloud, MD 01/27/2020, 4:33 PM  Pager: [phone]

## 2020-01-27 NOTE — Progress Notes (Signed)
Pt extremely altered, moving head and feet. Called nurse to see if more meds were available. Nurse unavailable, currently with other pt. Will call back later if more meds can be given

## 2020-01-27 NOTE — ED Provider Notes (Signed)
Angiocath insertion Performed by: Tilden Fossa  Consent: Verbal consent obtained. Risks and benefits: risks, benefits and alternatives were discussed Time out: Immediately prior to procedure a "time out" was called to verify the correct patient, procedure, equipment, support staff and site/side marked as required.  Preparation: Patient was prepped and draped in the usual sterile fashion.  Vein Location: right AC   Ultrasound Guided  Gauge: 20  Normal blood return and flush without difficulty Patient tolerance: Patient tolerated the procedure well with no immediate complications.      Tilden Fossa, MD 01/27/20 [phone]

## 2020-01-27 NOTE — Progress Notes (Signed)
VAST consulted to obtain 2nd IV access for possible potassium infusion. Educated pt's RN, Irving Burton about vein preservation and not placing just in case IV's. Further advised when patient needs another access, VAST will be glad to assess vasculature and assist.

## 2020-01-27 NOTE — Progress Notes (Signed)
   01/27/20 2204  Assess: MEWS Score  Temp 98.7 F (37.1 C)  BP (!) 169/71  Pulse Rate (!) 118  ECG Heart Rate (!) 118  Resp 20  SpO2 94 %  Assess: if the MEWS score is Yellow or Red  Were vital signs taken at a resting state? Yes  Focused Assessment Documented focused assessment  Early Detection of Sepsis Score *See Row Information* Medium  MEWS guidelines implemented *See Row Information* Yes  Treat  MEWS Interventions Other (Comment) (Notifed IM MD)  Take Vital Signs  Increase Vital Sign Frequency  Yellow: Q 2hr X 2 then Q 4hr X 2, if remains yellow, continue Q 4hrs  Escalate  MEWS: Escalate Yellow: discuss with charge nurse/RN and consider discussing with provider and RRT  Notify: Charge Nurse/RN  Name of Charge Nurse/RN Notified Terrana, RN  Date Charge Nurse/RN Notified 01/27/20  Time Charge Nurse/RN Notified 2233   Notified Internal Medicine MD on call.  Pt Heart rate sustaining in the 120's and BP 169/71

## 2020-01-28 ENCOUNTER — Inpatient Hospital Stay (HOSPITAL_COMMUNITY): Payer: Medicare Other

## 2020-01-28 DIAGNOSIS — E876 Hypokalemia: Secondary | ICD-10-CM

## 2020-01-28 DIAGNOSIS — R000 Tachycardia, unspecified: Secondary | ICD-10-CM

## 2020-01-28 DIAGNOSIS — R4182 Altered mental status, unspecified: Secondary | ICD-10-CM

## 2020-01-28 LAB — COMPREHENSIVE METABOLIC PANEL
ALT: 23 U/L (ref 0–44)
AST: 86 U/L — ABNORMAL HIGH (ref 15–41)
Albumin: 3.4 g/dL — ABNORMAL LOW (ref 3.5–5.0)
Alkaline Phosphatase: 75 U/L (ref 38–126)
Anion gap: 15 (ref 5–15)
BUN: 9 mg/dL (ref 8–23)
CO2: 22 mmol/L (ref 22–32)
Calcium: 8.7 mg/dL — ABNORMAL LOW (ref 8.9–10.3)
Chloride: 99 mmol/L (ref 98–111)
Creatinine, Ser: 0.64 mg/dL (ref 0.44–1.00)
GFR calc Af Amer: >60 mL/min (ref >60)
GFR calc non Af Amer: >60 mL/min (ref >60)
Glucose, Bld: 115 mg/dL — ABNORMAL HIGH (ref 70–99)
Potassium: 3.2 mmol/L — ABNORMAL LOW (ref 3.5–5.1)
Sodium: 136 mmol/L (ref 135–145)
Total Bilirubin: 1.2 mg/dL (ref 0.3–1.2)
Total Protein: 7.3 g/dL (ref 6.5–8.1)

## 2020-01-28 LAB — CBC WITH DIFFERENTIAL/PLATELET
Abs Immature Granulocytes: 0.1 10*3/uL — ABNORMAL HIGH (ref 0.00–0.07)
Basophils Absolute: 0.1 10*3/uL (ref 0.0–0.1)
Basophils Relative: 0 %
Eosinophils Absolute: 0 10*3/uL (ref 0.0–0.5)
Eosinophils Relative: 0 %
HCT: 42 % (ref 36.0–46.0)
Hemoglobin: 13.9 g/dL (ref 12.0–15.0)
Immature Granulocytes: 1 %
Lymphocytes Relative: 12 %
Lymphs Abs: 2.1 10*3/uL (ref 0.7–4.0)
MCH: 31.5 pg (ref 26.0–34.0)
MCHC: 33.1 g/dL (ref 30.0–36.0)
MCV: 95.2 fL (ref 80.0–100.0)
Monocytes Absolute: 1.7 10*3/uL — ABNORMAL HIGH (ref 0.1–1.0)
Monocytes Relative: 9 %
Neutro Abs: 14.1 10*3/uL — ABNORMAL HIGH (ref 1.7–7.7)
Neutrophils Relative %: 78 %
Platelets: 271 10*3/uL (ref 150–400)
RBC: 4.41 MIL/uL (ref 3.87–5.11)
RDW: 13.4 % (ref 11.5–15.5)
WBC: 18.1 10*3/uL — ABNORMAL HIGH (ref 4.0–10.5)
nRBC: 0 % (ref 0.0–0.2)

## 2020-01-28 LAB — MRSA PCR SCREENING: MRSA by PCR: NEGATIVE

## 2020-01-28 LAB — BASIC METABOLIC PANEL
Anion gap: 15 (ref 5–15)
BUN: 8 mg/dL (ref 8–23)
CO2: 24 mmol/L (ref 22–32)
Calcium: 8.6 mg/dL — ABNORMAL LOW (ref 8.9–10.3)
Chloride: 101 mmol/L (ref 98–111)
Creatinine, Ser: 0.55 mg/dL (ref 0.44–1.00)
GFR calc Af Amer: >60 mL/min (ref >60)
GFR calc non Af Amer: >60 mL/min (ref >60)
Glucose, Bld: 55 mg/dL — ABNORMAL LOW (ref 70–99)
Potassium: 4 mmol/L (ref 3.5–5.1)
Sodium: 140 mmol/L (ref 135–145)

## 2020-01-28 LAB — GLUCOSE, CAPILLARY: Glucose-Capillary: 111 mg/dL — ABNORMAL HIGH (ref 70–99)

## 2020-01-28 LAB — CK: Total CK: 6383 U/L — ABNORMAL HIGH (ref 38–234)

## 2020-01-28 LAB — LACTIC ACID, PLASMA: Lactic Acid, Venous: 1.2 mmol/L (ref 0.5–1.9)

## 2020-01-28 MED ORDER — CHLORDIAZEPOXIDE HCL 25 MG PO CAPS: 25.0000 mg | ORAL_CAPSULE | Freq: Four times a day (QID) | ORAL | Status: DC

## 2020-01-28 MED ORDER — LORAZEPAM 2 MG/ML IJ SOLN
1.0000 mg | INTRAMUSCULAR | Status: DC | PRN
  Administered 2020-01-28: 2 mg via INTRAVENOUS
  Administered 2020-01-28 (×2): 4 mg via INTRAVENOUS
  Administered 2020-01-29 – 2020-01-30 (×4): 2 mg via INTRAVENOUS
  Filled 2020-01-28 (×5): qty 1
  Filled 2020-01-28 (×2): qty 2

## 2020-01-28 MED ORDER — LACTATED RINGERS IV SOLN: INTRAVENOUS | Status: DC

## 2020-01-28 MED ORDER — ACETAMINOPHEN 325 MG PO TABS
650.0000 mg | ORAL_TABLET | Freq: Four times a day (QID) | ORAL | Status: DC | PRN
  Administered 2020-01-28 – 2020-01-30 (×5): 650 mg via ORAL
  Filled 2020-01-28 (×5): qty 2

## 2020-01-28 MED ORDER — LORAZEPAM 1 MG PO TABS: 1.0000 mg | ORAL_TABLET | ORAL | Status: DC | PRN

## 2020-01-28 MED ORDER — VANCOMYCIN HCL IN DEXTROSE 1-5 GM/200ML-% IV SOLN
1000.0000 mg | Freq: Two times a day (BID) | INTRAVENOUS | Status: DC
  Administered 2020-01-28: 1000 mg via INTRAVENOUS
  Filled 2020-01-28: qty 200

## 2020-01-28 MED ORDER — METRONIDAZOLE IN NACL 5-0.79 MG/ML-% IV SOLN
500.0000 mg | Freq: Three times a day (TID) | INTRAVENOUS | Status: DC
  Administered 2020-01-28 – 2020-01-30 (×6): 500 mg via INTRAVENOUS
  Filled 2020-01-28 (×6): qty 100

## 2020-01-28 MED ORDER — DIAZEPAM 5 MG PO TABS
5.0000 mg | ORAL_TABLET | Freq: Four times a day (QID) | ORAL | Status: DC
  Administered 2020-01-28 – 2020-01-30 (×8): 5 mg via ORAL
  Filled 2020-01-28 (×8): qty 1

## 2020-01-28 MED ORDER — MAGNESIUM SULFATE 2 GM/50ML IV SOLN
2.0000 g | Freq: Once | INTRAVENOUS | Status: AC
  Administered 2020-01-28: 2 g via INTRAVENOUS
  Filled 2020-01-28: qty 50

## 2020-01-28 MED ORDER — POTASSIUM PHOSPHATES 15 MMOLE/5ML IV SOLN
20.0000 mmol | Freq: Once | INTRAVENOUS | Status: AC
  Administered 2020-01-28: 20 mmol via INTRAVENOUS
  Filled 2020-01-28: qty 6.67

## 2020-01-28 MED ORDER — LORAZEPAM 2 MG/ML IJ SOLN
1.0000 mg | INTRAMUSCULAR | Status: DC | PRN
  Administered 2020-01-28 (×3): 2 mg via INTRAVENOUS
  Filled 2020-01-28 (×3): qty 1

## 2020-01-28 NOTE — Progress Notes (Signed)
Patient demonstrated safe swallowing. C/o headache with no PRNs ordered. MD paged to start diet and prn pain medications.

## 2020-01-28 NOTE — Progress Notes (Signed)
Notified by phlebotomy that three phlebotomist have attempted to get labs from patient and have been unsuccessful. Per their protocol, phlebotomy not able to stick patient for next 24 hours. Dr. Cleaster Corin paged to notify and agreeable to midline catheter placement. IV team paged to notify about midline placement for assistance with lab draws.  Lyndal Pulley, RN 01/28/2020 3:15 PM

## 2020-01-28 NOTE — Progress Notes (Signed)
A consult was placed to IV Therapy to place a midline, for the purpose of lab draws;  Both arms assessed thoroughly by 2 IV Team RNs, using ultrasound;  Very poor veins; one vein noted in the right forearm, however, the tip of the midline would end at the [facility] area, and both existing ivs would need to be removed prior to a midline being placed;  Pt is extremely agitated, trying constantly to get out of bed; has 2 sitters helping to hold,  while assessing for iv access;  RN at bedside and is aware of situation;  She opted to keep both working ivs in the right arm;  Per RN, the pt is not diabetic; may suggest drawing labwork from the foot with an MD order.

## 2020-01-28 NOTE — Progress Notes (Signed)
Patient O2 sats in the low 80's.  Placed patient on 2 liters of nasal cannula.  Patient 02 sat in the 90's.  Will continue to monitor the patient

## 2020-01-28 NOTE — Progress Notes (Signed)
   01/28/20 0600  Assess: MEWS Score  Temp 99.2 F (37.3 C)  BP (!) 155/88  Pulse Rate (!) 131  ECG Heart Rate (!) 130  Resp (!) 34  SpO2 92 %  O2 Device Room Air  Assess: MEWS Score  MEWS Temp 0  MEWS Systolic 0  MEWS Pulse 3  MEWS RR 2  MEWS LOC 0  MEWS Score 5  MEWS Score Color Red  Assess: if the MEWS score is Yellow or Red  Were vital signs taken at a resting state? Yes  Focused Assessment Documented focused assessment  Early Detection of Sepsis Score *See Row Information* Medium  Treat  MEWS Interventions Other (Comment) (notified charge nurse and MD)  Take Vital Signs  Increase Vital Sign Frequency  Red: Q 1hr X 4 then Q 4hr X 4, if remains red, continue Q 4hrs  Escalate  MEWS: Escalate Red: discuss with charge nurse/RN and provider, consider discussing with RRT  Notify: Charge Nurse/RN  Name of Charge Nurse/RN Notified Terrana , RN  Date Charge Nurse/RN Notified 01/28/20  Time Charge Nurse/RN Notified 0630  Notify: Provider  Provider Name/Title Coe  Date Provider Notified 01/28/20  Time Provider Notified 0630  Notification Type Page  Notification Reason Change in status  Response No new orders  Date of Provider Response 01/28/20  Time of Provider Response [phone]

## 2020-01-28 NOTE — Progress Notes (Signed)
Called phlebotomy and asked about status of labs. Per phlebotomist, they've had two rejects so far and are sending someone up to obtain a third specimen.

## 2020-01-28 NOTE — Progress Notes (Signed)
   01/28/20 0721  Assess: MEWS Score  Temp 99.8 F (37.7 C)  BP (!) 158/141  Pulse Rate (!) 138  ECG Heart Rate (!) 138  Resp (!) 34  Level of Consciousness Alert  SpO2 90 %  O2 Device Nasal Cannula  O2 Flow Rate (L/min) 2 L/min  Assess: MEWS Score  MEWS Temp 0  MEWS Systolic 0  MEWS Pulse 3  MEWS RR 2  MEWS LOC 0  MEWS Score 5  MEWS Score Color Red  Assess: if the MEWS score is Yellow or Red  Were vital signs taken at a resting state? Yes  Focused Assessment Documented focused assessment  Early Detection of Sepsis Score *See Row Information* Medium  MEWS guidelines implemented *See Row Information* Yes  Treat  MEWS Interventions Escalated (See documentation below)  Take Vital Signs  Increase Vital Sign Frequency  Red: Q 1hr X 4 then Q 4hr X 4, if remains red, continue Q 4hrs  Escalate  MEWS: Escalate Red: discuss with charge nurse/RN and provider, consider discussing with RRT  Notify: Charge Nurse/RN  Name of Charge Nurse/RN Notified Shinita, RN  Date Charge Nurse/RN Notified 01/28/20  Time Charge Nurse/RN Notified [id number]  Notify: Provider  Provider Name/Title Dr. Mcarthur Rossetti  Date Provider Notified 01/28/20  Time Provider Notified [phone]  Notification Type Page  Notification Reason Other (Comment) (to make aware)  Document  Patient Outcome Not stable and remains on department

## 2020-01-28 NOTE — ED Provider Notes (Signed)
Medical Decision Making: Care of patient assumed from outgoing provider at  12:30 PM.  Agree with history, physical exam and plan.  See their note for further details.  Briefly, 64 y.o. female with PMH/PSH as below.  Chief Complaint  Patient presents with  . Abdominal Pain  . Emesis    Past Medical History:  Diagnosis Date  . Anxiety   . Depression   . Hypertension   . Insomnia   . Meningitis   . Seizures (HCC)   . Substance abuse [facility])    Past Surgical History:  Procedure Laterality Date  . BACK SURGERY    . CERVICAL DISC SURGERY       Results for orders placed or performed during the hospital encounter of 01/27/20  Blood culture (routine x 2)   Specimen: BLOOD RIGHT WRIST  Result Value Ref Range   Specimen Description BLOOD RIGHT WRIST    Special Requests      BOTTLES DRAWN AEROBIC AND ANAEROBIC Blood Culture results may not be optimal due to an inadequate volume of blood received in culture bottles   Culture      NO GROWTH < 24 HOURS Performed at [facility], [address], [place], [place] [address]    Report Status PENDING   Blood culture (routine x 2)   Specimen: BLOOD LEFT ARM  Result Value Ref Range   Specimen Description BLOOD LEFT ARM    Special Requests      BOTTLES DRAWN AEROBIC ONLY Blood Culture adequate volume   Culture      NO GROWTH < 12 HOURS Performed at [facility], [address], [place], [place] [address]    Report Status PENDING   SARS Coronavirus 2 by RT PCR (hospital order, performed in [facility] hospital lab) Nasopharyngeal Nasopharyngeal Swab   Specimen: Nasopharyngeal Swab  Result Value Ref Range   SARS Coronavirus 2 NEGATIVE NEGATIVE  MRSA PCR Screening   Specimen: Nasopharyngeal  Result Value Ref Range   MRSA by PCR NEGATIVE NEGATIVE  Comprehensive metabolic panel  Result Value Ref Range   Sodium 138 135 - 145 mmol/L   Potassium 2.6 (LL) 3.5 - 5.1 mmol/L   Chloride 98 98 - 111 mmol/L   CO2 21 (L) 22 - 32  mmol/L   Glucose, Bld 156 (H) 70 - 99 mg/dL   BUN 18 8 - 23 mg/dL   Creatinine, Ser 5.03 0.44 - 1.00 mg/dL   Calcium 54.6 8.9 - 56.8 mg/dL   Total Protein 9.0 (H) 6.5 - 8.1 g/dL   Albumin 4.1 3.5 - 5.0 g/dL   AST 52 (H) 15 - 41 U/L   ALT 22 0 - 44 U/L   Alkaline Phosphatase 96 38 - 126 U/L   Total Bilirubin 0.9 0.3 - 1.2 mg/dL   GFR calc non Af Amer >60 >60 mL/min   GFR calc Af Amer >60 >60 mL/min   Anion gap 19 (H) 5 - 15  CBC with Differential  Result Value Ref Range   WBC 26.0 (H) 4.0 - 10.5 K/uL   RBC 5.70 (H) 3.87 - 5.11 MIL/uL   Hemoglobin 18.1 (H) 12.0 - 15.0 g/dL   HCT 12.7 (H) 36 - 46 %   MCV 93.3 80.0 - 100.0 fL   MCH 31.8 26.0 - 34.0 pg   MCHC 34.0 30.0 - 36.0 g/dL   RDW 51.7 00.1 - 74.9 %   Platelets 419 (H) 150 - 400 K/uL   nRBC 0.0  0.0 - 0.2 %   Neutrophils Relative % 86 %   Neutro Abs 22.4 (H) 1.7 - 7.7 K/uL   Lymphocytes Relative 5 %   Lymphs Abs 1.3 0.7 - 4.0 K/uL   Monocytes Relative 9 %   Monocytes Absolute 2.3 (H) 0 - 1 K/uL   Eosinophils Relative 0 %   Eosinophils Absolute 0.0 0 - 0 K/uL   Basophils Relative 0 %   Basophils Absolute 0.0 0 - 0 K/uL   nRBC 0 0 /100 WBC   Abs Immature Granulocytes 0.00 0.00 - 0.07 K/uL   Polychromasia PRESENT   Lipase, blood  Result Value Ref Range   Lipase 23 11 - 51 U/L  Urinalysis, Routine w reflex microscopic  Result Value Ref Range   Color, Urine YELLOW YELLOW   APPearance CLEAR CLEAR   Specific Gravity, Urine 1.041 (H) 1.005 - 1.030   pH 6.0 5.0 - 8.0   Glucose, UA NEGATIVE NEGATIVE mg/dL   Hgb urine dipstick LARGE (A) NEGATIVE   Bilirubin Urine NEGATIVE NEGATIVE   Ketones, ur NEGATIVE NEGATIVE mg/dL   Protein, ur 100 (A) NEGATIVE mg/dL   Nitrite NEGATIVE NEGATIVE   Leukocytes,Ua SMALL (A) NEGATIVE   RBC / HPF 0-5 0 - 5 RBC/hpf   WBC, UA 11-20 0 - 5 WBC/hpf   Bacteria, UA MANY (A) NONE SEEN   Squamous Epithelial / LPF 0-5 0 - 5   Granular Casts, UA PRESENT   Ethanol  Result Value Ref Range    Alcohol, Ethyl (B) <10 <10 mg/dL  Urine rapid drug screen (hosp performed)  Result Value Ref Range   Opiates NONE DETECTED NONE DETECTED   Cocaine NONE DETECTED NONE DETECTED   Benzodiazepines NONE DETECTED NONE DETECTED   Amphetamines NONE DETECTED NONE DETECTED   Tetrahydrocannabinol NONE DETECTED NONE DETECTED   Barbiturates NONE DETECTED NONE DETECTED  Lactic acid, plasma  Result Value Ref Range   Lactic Acid, Venous 2.7 (HH) 0.5 - 1.9 mmol/L  Lactic acid, plasma  Result Value Ref Range   Lactic Acid, Venous 2.0 (HH) 0.5 - 1.9 mmol/L  Acetaminophen level  Result Value Ref Range   Acetaminophen (Tylenol), Serum <10 (L) 10 - 30 ug/mL  Ammonia  Result Value Ref Range   Ammonia 40 (H) 9 - 35 umol/L  Magnesium  Result Value Ref Range   Magnesium 2.4 1.7 - 2.4 mg/dL  HIV Antibody (routine testing w rflx)  Result Value Ref Range   HIV Screen 4th Generation wRfx Non Reactive Non Reactive  CBC  Result Value Ref Range   WBC 28.0 (H) 4.0 - 10.5 K/uL   RBC 5.27 (H) 3.87 - 5.11 MIL/uL   Hemoglobin 16.8 (H) 12.0 - 15.0 g/dL   HCT 49.4 (H) 36 - 46 %   MCV 93.7 80.0 - 100.0 fL   MCH 31.9 26.0 - 34.0 pg   MCHC 34.0 30.0 - 36.0 g/dL   RDW 13.6 11.5 - 15.5 %   Platelets 362 150 - 400 K/uL   nRBC 0.0 0.0 - 0.2 %  Creatinine, serum  Result Value Ref Range   Creatinine, Ser 0.69 0.44 - 1.00 mg/dL   GFR calc non Af Amer >60 >60 mL/min   GFR calc Af Amer >60 >60 mL/min  CK  Result Value Ref Range   Total CK 9,875 (H) 32.6 - 712.4 U/L  Salicylate level  Result Value Ref Range   Salicylate Lvl <5.8 (L) 7.0 - 30.0 mg/dL  Brain natriuretic peptide  Result Value Ref Range   B Natriuretic Peptide 255.8 (H) 0.0 - 100.0 pg/mL  Magnesium  Result Value Ref Range   Magnesium 2.1 1.7 - 2.4 mg/dL  Phosphorus  Result Value Ref Range   Phosphorus 2.1 (L) 2.5 - 4.6 mg/dL  TSH  Result Value Ref Range   TSH 0.892 0.350 - 4.500 uIU/mL  Protime-INR  Result Value Ref Range   Prothrombin Time  13.0 11.4 - 15.2 seconds   INR 1.0 0.8 - 1.2  Comprehensive metabolic panel  Result Value Ref Range   Sodium 136 135 - 145 mmol/L   Potassium 3.2 (L) 3.5 - 5.1 mmol/L   Chloride 99 98 - 111 mmol/L   CO2 22 22 - 32 mmol/L   Glucose, Bld 115 (H) 70 - 99 mg/dL   BUN 9 8 - 23 mg/dL   Creatinine, Ser 1.610.64 0.44 - 1.00 mg/dL   Calcium 8.7 (L) 8.9 - 10.3 mg/dL   Total Protein 7.3 6.5 - 8.1 g/dL   Albumin 3.4 (L) 3.5 - 5.0 g/dL   AST 86 (H) 15 - 41 U/L   ALT 23 0 - 44 U/L   Alkaline Phosphatase 75 38 - 126 U/L   Total Bilirubin 1.2 0.3 - 1.2 mg/dL   GFR calc non Af Amer >60 >60 mL/min   GFR calc Af Amer >60 >60 mL/min   Anion gap 15 5 - 15  Glucose, capillary  Result Value Ref Range   Glucose-Capillary 111 (H) 70 - 99 mg/dL  I-stat chem 8, ED ([facility] or [facility])  Result Value Ref Range   Sodium 142 135 - 145 mmol/L   Potassium 2.5 (LL) 3.5 - 5.1 mmol/L   Chloride 99 98 - 111 mmol/L   BUN 21 8 - 23 mg/dL   Creatinine, Ser 0.960.80 0.44 - 1.00 mg/dL   Glucose, Bld 045151 (H) 70 - 99 mg/dL   Calcium, Ion 4.091.05 (L) 1.15 - 1.40 mmol/L   TCO2 29 22 - 32 mmol/L   Hemoglobin 18.0 (H) 12.0 - 15.0 g/dL   HCT 81.153.0 (H) 36 - 46 %  Troponin I (High Sensitivity)  Result Value Ref Range   Troponin I (High Sensitivity) 258 (HH) <18 ng/L  Troponin I (High Sensitivity)  Result Value Ref Range   Troponin I (High Sensitivity) 242 (HH) <18 ng/L   CT Head Wo Contrast  Result Date: 01/27/2020 CLINICAL DATA:  Altered mental status with nausea and vomiting. EXAM: CT HEAD WITHOUT CONTRAST CT ABDOMEN AND PELVIS WITH CONTRAST TECHNIQUE: Contiguous axial images were obtained from the base of the skull through the vertex without intravenous contrast. Multidetector CT imaging of the abdomen and pelvis was performed during intravenous contrast administration. CONTRAST:  100mL OMNIPAQUE IOHEXOL 300 MG/ML  SOLN COMPARISON:  September 22, 2018 FINDINGS: CT HEAD FINDINGS Brain: No evidence of acute infarction, hemorrhage,  hydrocephalus, extra-axial collection or mass lesion/mass effect. Vascular: No hyperdense vessel or unexpected calcification. Skull: Normal. Negative for fracture or focal lesion. Sinuses/Orbits: No acute finding. Other: The study is limited secondary to patient motion. CT ABDOMEN AND PELVIS FINDINGS Hepatobiliary: No focal liver lesions are identified. Gallbladder is normal in appearance without evidence of gallstones or gallbladder wall thickening. Pancreas: No mass or inflammatory process identified on this un-enhanced exam. Spleen: Within normal limits in size. Adrenals/Urinary Tract: No evidence of urolithiasis or hydronephrosis. No definite mass visualized on this un-enhanced exam. Mild symmetric diffuse urinary bladder wall thickening is noted. Stomach/Bowel: No evidence of bowel dilatation.  Mild diffuse thickening of the sigmoid colon is seen which is also decompressed. The appendix is not identified. Vascular/Lymphatic: No pathologically enlarged lymph nodes. No evidence of abdominal aortic aneurysm. Reproductive: The uterus is small in size and appears to be position to the right of midline. Musculoskeletal: Multilevel degenerative changes seen throughout the lumbar spine. Other: Mild atelectasis and/or infiltrate is seen within the bilateral lung bases, left greater than right. IMPRESSION: 1. Limited study secondary to patient motion. 2. No acute intracranial abnormality. 3. Mild diffuse thickening of the sigmoid colon which is also decompressed. This may represent a mild colitis. 4. Mild symmetric diffuse urinary bladder wall thickening. Correlation with urinalysis is recommended. 5. Mild atelectasis and/or infiltrate within the bilateral lung bases, left greater than right. Electronically Signed   By: Aram Candela M.D.   On: 01/27/2020 15:52   CT ABDOMEN PELVIS W CONTRAST  Result Date: 01/27/2020 CLINICAL DATA:  Altered mental status with nausea and vomiting. EXAM: CT HEAD CT ABDOMEN AND PELVIS  WITH CONTRAST TECHNIQUE: Multidetector CT imaging of the abdomen and pelvis was performed using the standard protocol following bolus administration of intravenous contrast. CONTRAST:  OMNIPAQUE IOHEXOL 300 MG/ML  SOLN COMPARISON:  None. FINDINGS: CT HEAD FINDINGS Brain: No evidence of acute infarction, hemorrhage, hydrocephalus, extra-axial collection or mass lesion/mass effect. Vascular: No hyperdense vessel or unexpected calcification. Skull: Normal. Negative for fracture or focal lesion. Sinuses/Orbits: No acute finding. Other: The study is limited secondary to patient motion. CT ABDOMEN AND PELVIS FINDINGS Hepatobiliary: No focal liver lesions are identified. Gallbladder is normal in appearance without evidence of gallstones or gallbladder wall thickening. Pancreas: No mass or inflammatory process identified on this un-enhanced exam. Spleen: Within normal limits in size. Adrenals/Urinary Tract: No evidence of urolithiasis or hydronephrosis. No definite mass visualized on this un-enhanced exam. Mild symmetric diffuse urinary bladder wall thickening is noted. Stomach/Bowel: No evidence of bowel dilatation. Mild diffuse thickening of the sigmoid colon is seen which is also decompressed. The appendix is not identified. Vascular/Lymphatic: No pathologically enlarged lymph nodes. No evidence of abdominal aortic aneurysm. Reproductive: The uterus is small in size and appears to be position to the right of midline. Musculoskeletal: Multilevel degenerative changes seen throughout the lumbar spine. Other: Mild atelectasis and/or infiltrate is seen within the bilateral lung bases, left greater than right. IMPRESSION: 1. Limited study secondary to patient motion. 2. No acute intracranial abnormality. 3. Mild diffuse thickening of the sigmoid colon which is also decompressed. This may represent a mild colitis. 4. Mild symmetric diffuse urinary bladder wall thickening. Correlation with urinalysis is recommended. 5.  Mild atelectasis and/or infiltrate within the bilateral lung bases, left greater than right. Electronically Signed   By: Aram Candela M.D.   On: 01/27/2020 15:54   DG Chest Port 1 View  Result Date: 01/27/2020 CLINICAL DATA:  Altered mental status. EXAM: PORTABLE CHEST 1 VIEW COMPARISON:  09/22/2018 FINDINGS: Cardiomediastinal contours and hilar structures are normal. Lungs are clear. Minimal central vascular engorgement. No sign of pleural effusion. Signs of cervical spine fusion as before. No acute bone abnormality to the extent evaluated. IMPRESSION: Minimal central vascular engorgement. No consolidation or pleural effusion. Electronically Signed   By: Donzetta Kohut M.D.   On: 01/27/2020 12:46    Medications  potassium chloride 10 mEq in 100 mL IVPB (0 mEq Intravenous Stopped 01/27/20 1923)  lactated ringers infusion ( Intravenous New Bag/Given 01/28/20 1151)  lactated ringers bolus 1,000 mL (0 mLs Intravenous Stopped 01/27/20 2044)  magnesium sulfate  IVPB 2 g 50 mL (0 g Intravenous Stopped 01/27/20 1923)  lactated ringers bolus 1,000 mL (0 mLs Intravenous Stopped 01/27/20 2044)  potassium chloride 10 mEq in 100 mL IVPB (0 mEq Intravenous Stopped 01/27/20 1924)  LORazepam (ATIVAN) injection 1 mg (1 mg Intravenous Given 01/27/20 1357)  iohexol (OMNIPAQUE) 300 MG/ML solution 100 mL (100 mLs Intravenous Contrast Given 01/27/20 1453)  lactated ringers bolus 1,000 mL (0 mLs Intravenous Stopped 01/27/20 2044)  LORazepam (ATIVAN) injection 1 mg (1 mg Intravenous Given 01/27/20 1735)    Clinical Course as of Jan 27 1229  Fri Jan 27, 2020  1545 Watcher. N/V/D. AMS. Chronic pain. Encephalopathy. Hx of same. Hypokalemia. Step down likely.    [AL]  1551 Unassigned   [AL]    Clinical Course User Index [AL] Janeece Fitting, MD    Interval MDM: Patient reassessed after signout, mental status much improved.  Patient's friend in the room and states patient is doing much better.  Patient has no active  nausea or vomiting at this time.  Patient able to endorse lower left quadrant pain.  Denies any IV drug use, history of endocarditis.  Patient CT scan with possible mild sigmoid colitis, bladder wall thickening concerning for UTI.  Patient's urine pending.  Outgoing team ordered antibiotics for patient's multiple SIRS criteria with no specific source, no fever.  Broad-spectrum given.  Additional fluids given tachycardia.  As patient is much improved and no surgical findings on CT abdomen pelvis hospital service was contacted for  floor admission for encephalopathy.  Patient admitted to hospitalist service.  Diagnosis, treatment and plan of care was discussed and agreed upon with patient.  Patient comfortable with admission at this time.    Janeece Fitting, MD 01/28/20 1236    Benjiman Core, MD 01/28/20 2007

## 2020-01-28 NOTE — Progress Notes (Addendum)
HD#1 Subjective:  Overnight Events: admitted yesterday evening.   Patient resting in bed and continues to appear anxious and uncomfortable although improved from presentation.  Patient continues to have diffuse body pains but feels warm and flushed.  She admits to abdominal pain but otherwise has a difficult time answering questions.  Objective:  Vital signs in last 24 hours: Vitals:   01/28/20 0221 01/28/20 0429 01/28/20 0447 01/28/20 0500  BP: (!) 142/82 (!) 160/78 (!) 148/79 (!) 163/85  Pulse: (!) 129 (!) 120 (!) 120 (!) 126  Resp: 19 (!) 58 (!) 26 (!) 25  Temp: 99.9 F (37.7 C) 99.4 F (37.4 C) 99.2 F (37.3 C) 99.2 F (37.3 C)  TempSrc: Axillary Axillary Axillary Axillary  SpO2: 93% 94% 95% 93%  Weight: 90 kg      Supplemental O2: Room Air SpO2: 93 %   Physical Exam:  Physical Exam Constitutional:      Appearance: She is diaphoretic (warm and flushed).  HENT:     Head: Normocephalic and atraumatic.  Eyes:     Extraocular Movements: Extraocular movements intact.  Cardiovascular:     Rate and Rhythm: Tachycardia present.     Pulses: Normal pulses.  Pulmonary:     Effort: Pulmonary effort is normal. Tachypnea present.  Abdominal:     Palpations: Abdomen is soft.     Tenderness: There is abdominal tenderness (diffuse).  Musculoskeletal:        General: Tenderness (diffuse myalgias) present. Normal range of motion.  Skin:    General: Skin is warm.     Findings: Erythema (flushed face and upper extremities) present.  Neurological:     Mental Status: She is alert.  Psychiatric:        Mood and Affect: Mood is anxious.     Filed Weights   01/28/20 0209 01/28/20 0221  Weight: 90 kg 90 kg    No intake or output data in the 24 hours ending 01/28/20 0534 Net IO Since Admission: No IO data has been entered for this period [01/28/20 0534]  Pertinent Labs: CBC Latest Ref Rng & Units 01/27/2020 01/27/2020 01/27/2020  WBC 4.0 - 10.5 K/uL 28.0(H) - 26.0(H)    Hemoglobin 12.0 - 15.0 g/dL 16.8(H) 18.0(H) 18.1(H)  Hematocrit 36 - 46 % 49.4(H) 53.0(H) 53.2(H)  Platelets 150 - 400 K/uL 362 - 419(H)    CMP Latest Ref Rng & Units 01/28/2020 01/27/2020 01/27/2020  Glucose 70 - 99 mg/dL 371(I) - 967(E)  BUN 8 - 23 mg/dL 9 - 21  Creatinine 9.38 - 1.00 mg/dL 1.01 7.51 0.25  Sodium 135 - 145 mmol/L 136 - 142  Potassium 3.5 - 5.1 mmol/L 3.2(L) - 2.5(LL)  Chloride 98 - 111 mmol/L 99 - 99  CO2 22 - 32 mmol/L 22 - -  Calcium 8.9 - 10.3 mg/dL 8.5(I) - -  Total Protein 6.5 - 8.1 g/dL 7.3 - -  Total Bilirubin 0.3 - 1.2 mg/dL 1.2 - -  Alkaline Phos 38 - 126 U/L 75 - -  AST 15 - 41 U/L 86(H) - -  ALT 0 - 44 U/L 23 - -    Pending Labs: MRI, EEG  Imaging: none  Assessment/Plan:   Active Problems:   Altered mental state    has a past medical history of Anxiety, Depression, Hypertension, Insomnia, Meningitis, Seizures (HCC), and Substance abuse (HCC).  Patient Summary: Stephanie Carrillo is a 64 y.o. with a pertinent PMH of opioid use now on buprenorphine, seizures (previously on phenobarbitol  years ago), depression, and anxiety, who presented with N/V/D and admitted for like benzodiazepine withdrawal with sign and symptoms concerning for rhabdomyolysis.   She is on hospital day 1 with minimal improvement. She appears more alert and oriented today, but continues to experience substantial symptoms of benzodiazepine withdrawal including diaphoresis, anxiety, diffuse muscle pain and tachycardia.  It is difficult to exclude a component of serotonin syndrome with hyperreflexia, muscle spasms, and hyperthermia.  We will continue to treat both etiologies with diazepam and titrate the dose as needed to achieve symptom medic relief.  It is difficult to rule out infection at this time and will continue antibiotic therapy with aztreonam and metronidazole.  Continue to trend blood and urine cultures and monitor for clinical signs of improvement.  However, I do believe that  infection is less likely.  Patient also has evidence of rhabdomyolysis which makes me concerned for recent seizure versus prolonged immobilization.  We will continue to manage with IV fluids at 200 cc/h and trending urine output and CK level every 8-12 hours depending on the trend of the CK level.  There is no evidence of pigment induced nephropathy on CMP today.   #Acute encephalopathy  - Continue Diazepam 5 mg q6 hours titrate to 10 mg if symptoms are not well controlled.  - Continue CIWA with ativan q 1hour - EEG and MRI of brain pending - Continue supplementation with thiamin and folic acid.    #Rhabdomyolysis: - Continue IVF with LR at 200 cc/hr. - Strict ins and outs - Trend CK q 8-12 hours.  #Leukocytosis secondary to UTI vs Colitis vs nonspecific inflammatory reaction)  - Continue to trend leukocytosis, BCx, and UCx - Continue Metronidazole 500 mg IV q 8 hours and Aztreonam 2g q 8 hours  #Hypokalemia: #Hypomagnesemia: #Hypophosphatemia: - Continue to replete   #Opioid-use disorder: - Patient was restarted on her home subutex 2 mg sublingual daily  Diet: NPO IVF: LR,200 cc VTE: Enoxaparin Code: Full PT/OT recs: Pending, TOC recs: consulted for substance use   Dispo: Anticipated discharge to Home in 2 days pending further evaluation and management.   Marianna Payment, D.O. [facility], PGY-1 Date 01/28/2020 Time 5:34 AM Pager: [phone] Please contact the on call pager after 5 pm and on weekends at [phone].

## 2020-01-28 NOTE — Plan of Care (Signed)
  Problem: Coping: Goal: Level of anxiety will decrease Outcome: Not Progressing   

## 2020-01-28 NOTE — Progress Notes (Signed)
°  Date: 01/28/2020  Patient name: Stephanie Carrillo  Medical record number: [id number]  Date of birth: 1955-11-07   I have seen and evaluated [name] and discussed their care with the Residency Team. Briefly, Ms. Spells came in with AMS, fever, concern for infection, concern for BZD withdrawal and possible serotonin syndrome.  She is not clear at this time about what she took, but her friend who is with her today did note that she had stopped the clonazepam about 2 weeks ago.  She has an elevated CK which may be related to the myoclonus or to a seizure which was unwitnessed.  Overall, she has a complicated case and we will treat her empirically for a few possibilities.  She is improved today.  She was able to answer some questions, but she did not remember what happened.   Vitals:   01/28/20 0800 01/28/20 0819  BP: (!) 171/104 (!) 168/86  Pulse: (!) 132 (!) 134  Resp: (!) 28 (!) 42  Temp: 99.3 F (37.4 C)   SpO2: 93% 92%   General: She is awake, alert, able to answer questions, but inattentive and fidgety Eyes: Injected conjuntivae HENT: Neck is supple CV: Tachycardic, regular, no LE edema Pulm: She is tachypneic but breathing comfortably, no wheezing Abd: Diffusely mild tenderness, +BS Skin: Warm, very mild erythema on arms and face (plethora?)  Assessment and Plan: I have seen and evaluated the patient as outlined above. I agree with the formulated Assessment and Plan as detailed in the residents' note, with the following changes:   1. Acute encephalopathy, possible BZD withdrawal, possible serotonin syndrome - Will plan to start scheduled BZD with valium 5mg  q 6 hours.  Increase to 10mg  q 6 hours if improving, but still with agitation - Hold all serotonin affecting products and allow to wash out.  - Continue CIWA protocol for breakthrough - Continue sitter  2. Leukocytosis - It is unclear if truly infectious, could be reactive - Monitor for temperature elevated > 101.5 -  Continue empiric antibiotics for now (can stop vancomycin)  3. Hypokalemia, hypophosphatemia, hypomagnesemia - Replete  4. Lactic acidosis, elevated CK, hemoglobinuria --> rhabdomyolysis Possible Seizure - History of seizures and also could be a BZD withdrawal seizure - Valium as noted above, CIWA protocol - IVF with LR or NS at 150 - 200cc/hr, trend CK - Trend lactate  Other issues per Dr. daily note.   , MD 6/26/20218:39 AM

## 2020-01-28 NOTE — Hospital Course (Addendum)
Stephanie Carrillo is a 64 y.o. with a pertinent PMH of opioid use now on buprenorphine, seizures (previously on phenobarbitol years ago), depression, and anxiety, who presented with N/V/D and admitted for like benzodiazepine withdrawal with sign and symptoms concerning for rhabdomyolysis.    Acute Encephalopathy 2/2 to Benzodiazepine overdose - Patient presented with signs and symptoms of benzodiazepine overdose after abruptly stopping her home clonazepam 0.5 mg tid. She was treated with a short course of diazepam 5 mg q 6 and CIWA with ativan q 1 hour prn with resolution of her symptoms. She was also given thiamine and folate supplementation. She was discharged on her home clonazepam prescription and instructed to follow up with her pcp.  Rhabdomyolysis - patient presented with elevated CK after being found down on the ground for roughly 12 hours. CK cleared with IV fluid therapy without evidence of long term kidney damage.   Opioid use disorder - patient was restarted on her home subutex 2 mg .  E coli UTI - patient presented with fever and dysuria and was found to have urine culture growing E coli. She was treated with 4 days of broad spectrum AB and discharged with one more day of nitrofurantoin 100 mg bid for 1 day to finish a 5 day course of Abs.

## 2020-01-28 NOTE — Progress Notes (Signed)
   01/28/20 0500  Assess: MEWS Score  Temp 99.2 F (37.3 C)  BP (!) 163/85  Pulse Rate (!) 126  ECG Heart Rate (!) 126  Resp (!) 25  SpO2 93 %  O2 Device Room Air  Assess: MEWS Score  MEWS Temp 0  MEWS Systolic 0  MEWS Pulse 2  MEWS RR 1  MEWS LOC 0  MEWS Score 3  MEWS Score Color Yellow    MD aware of BP and increasing heart rate .  Will continue to monitor the patient

## 2020-01-28 NOTE — Progress Notes (Signed)
STAT EEG completed; results pending. Dr Yadav notified. 

## 2020-01-28 NOTE — Procedures (Signed)
Patient Name: Stephanie Carrillo  MRN: [id number]  Epilepsy Attending: Charlsie Quest  Referring Physician/Provider: Dr Guinevere Scarlet Date: 01/28/2020 Duration: 23.25 mins  Patient history: 64yo F with h/o seizures who presented with ams. EEG to evaluate for seizure.  Level of alertness: Awake  AEDs during EEG study: None  Technical aspects: This EEG study was done with scalp electrodes positioned according to the 10-20 International system of electrode placement. Electrical activity was acquired at a sampling rate of 500Hz  and reviewed with a high frequency filter of 70Hz  and a low frequency filter of 1Hz . EEG data were recorded continuously and digitally stored.   Description:  EEG showed continuous generalized 3 to 6 Hz theta-delta slowing.Triphasic waves, generalized were also noted.  Hyperventilation and photic stimulation were not performed.     ABNORMALITY -Continuous slow, generalized -Triphasic waves, generalized  IMPRESSION: This study is suggestive of moderate diffuse encephalopathy, nonspecific etiology but likely related to toxic-metabolic etiology. No seizures or definite epileptiform discharges were seen throughout the recording.     Stephanie Carrillo 

## 2020-01-29 ENCOUNTER — Inpatient Hospital Stay (HOSPITAL_COMMUNITY): Payer: Medicare Other

## 2020-01-29 LAB — COMPREHENSIVE METABOLIC PANEL
ALT: 26 U/L (ref 0–44)
AST: 84 U/L — ABNORMAL HIGH (ref 15–41)
Albumin: 3.2 g/dL — ABNORMAL LOW (ref 3.5–5.0)
Alkaline Phosphatase: 62 U/L (ref 38–126)
Anion gap: 13 (ref 5–15)
BUN: 6 mg/dL — ABNORMAL LOW (ref 8–23)
CO2: 27 mmol/L (ref 22–32)
Calcium: 8.6 mg/dL — ABNORMAL LOW (ref 8.9–10.3)
Chloride: 101 mmol/L (ref 98–111)
Creatinine, Ser: 0.56 mg/dL (ref 0.44–1.00)
GFR calc Af Amer: >60 mL/min (ref >60)
GFR calc non Af Amer: >60 mL/min (ref >60)
Glucose, Bld: 96 mg/dL (ref 70–99)
Potassium: 3.9 mmol/L (ref 3.5–5.1)
Sodium: 141 mmol/L (ref 135–145)
Total Bilirubin: 0.9 mg/dL (ref 0.3–1.2)
Total Protein: 7 g/dL (ref 6.5–8.1)

## 2020-01-29 LAB — PHOSPHORUS: Phosphorus: 3.4 mg/dL (ref 2.5–4.6)

## 2020-01-29 LAB — CBC
HCT: 43.3 % (ref 36.0–46.0)
Hemoglobin: 14 g/dL (ref 12.0–15.0)
MCH: 31.8 pg (ref 26.0–34.0)
MCHC: 32.3 g/dL (ref 30.0–36.0)
MCV: 98.4 fL (ref 80.0–100.0)
Platelets: 250 10*3/uL (ref 150–400)
RBC: 4.4 MIL/uL (ref 3.87–5.11)
RDW: 13.4 % (ref 11.5–15.5)
WBC: 14.3 10*3/uL — ABNORMAL HIGH (ref 4.0–10.5)
nRBC: 0 % (ref 0.0–0.2)

## 2020-01-29 LAB — CK: Total CK: 3774 U/L — ABNORMAL HIGH (ref 38–234)

## 2020-01-29 LAB — GLUCOSE, CAPILLARY
Glucose-Capillary: 93 mg/dL (ref 70–99)
Glucose-Capillary: 99 mg/dL (ref 70–99)

## 2020-01-29 LAB — MAGNESIUM: Magnesium: 1.7 mg/dL (ref 1.7–2.4)

## 2020-01-29 IMAGING — MR MR HEAD WO/W CM
7 of 12 series · 28 of 48 positions shown · IV contrast (gadavist)
Comparison: CT head [DATE]

CLINICAL DATA: Encephalopathy confusion

EXAM:
MRI HEAD WITHOUT AND WITH CONTRAST
TECHNIQUE: Multiplanar, multiecho pulse sequences of the brain and surrounding
structures were obtained without and with intravenous contrast.
CONTRAST:  10mL GADAVIST GADOBUTROL 1 MMOL/ML IV SOLN

[Series 2: DWI · axial · 3.0mm · 0.94mm/px · z∈[-92,+55]mm · 9 of 100 slices shown (1 of 2)]
[im 1/100]
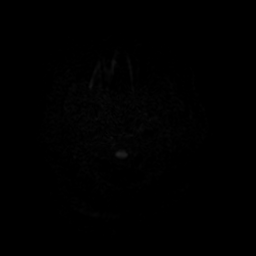
[im 13/100]
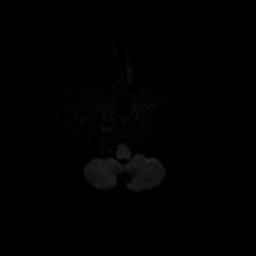
[im 25/100]
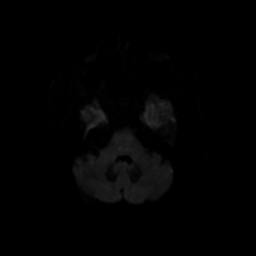
[im 38/100]
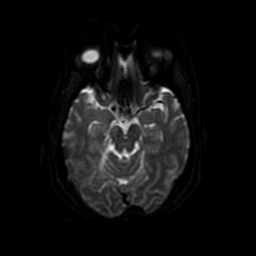
[im 50/100]
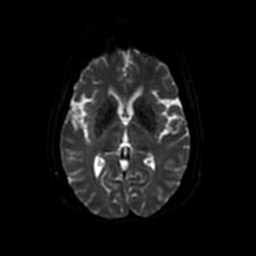
[im 62/100]
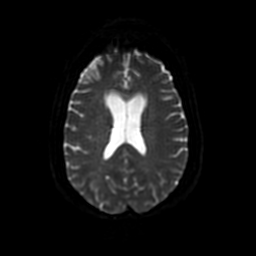
[im 75/100]
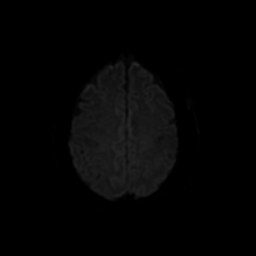
[im 87/100]
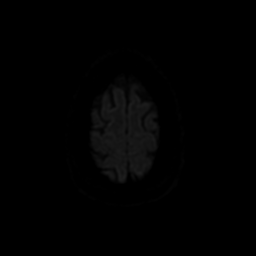
[im 100/100]
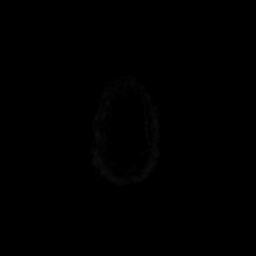

[Series 3: DWI · coronal · 4.0mm · 0.94mm/px · 6 of 70 slices shown (2 of 2)]
[im 1/70]
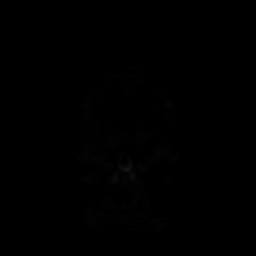
[im 14/70]
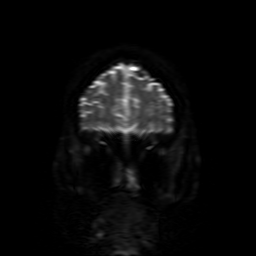
[im 28/70]
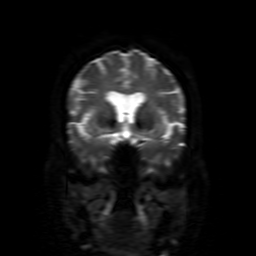
[im 42/70]
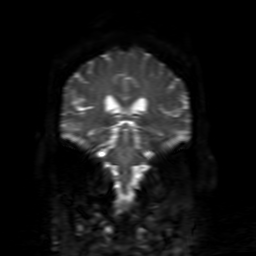
[im 56/70]
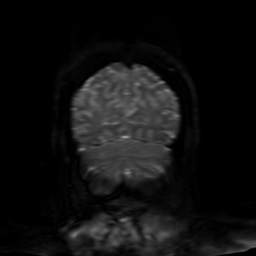
[im 70/70]
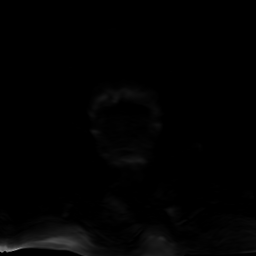

[Series 4: FLAIR · sagittal · 5.0mm · 0.23mm/px · 2 of 23 slices shown (1 of 2)]
[im 1/23]
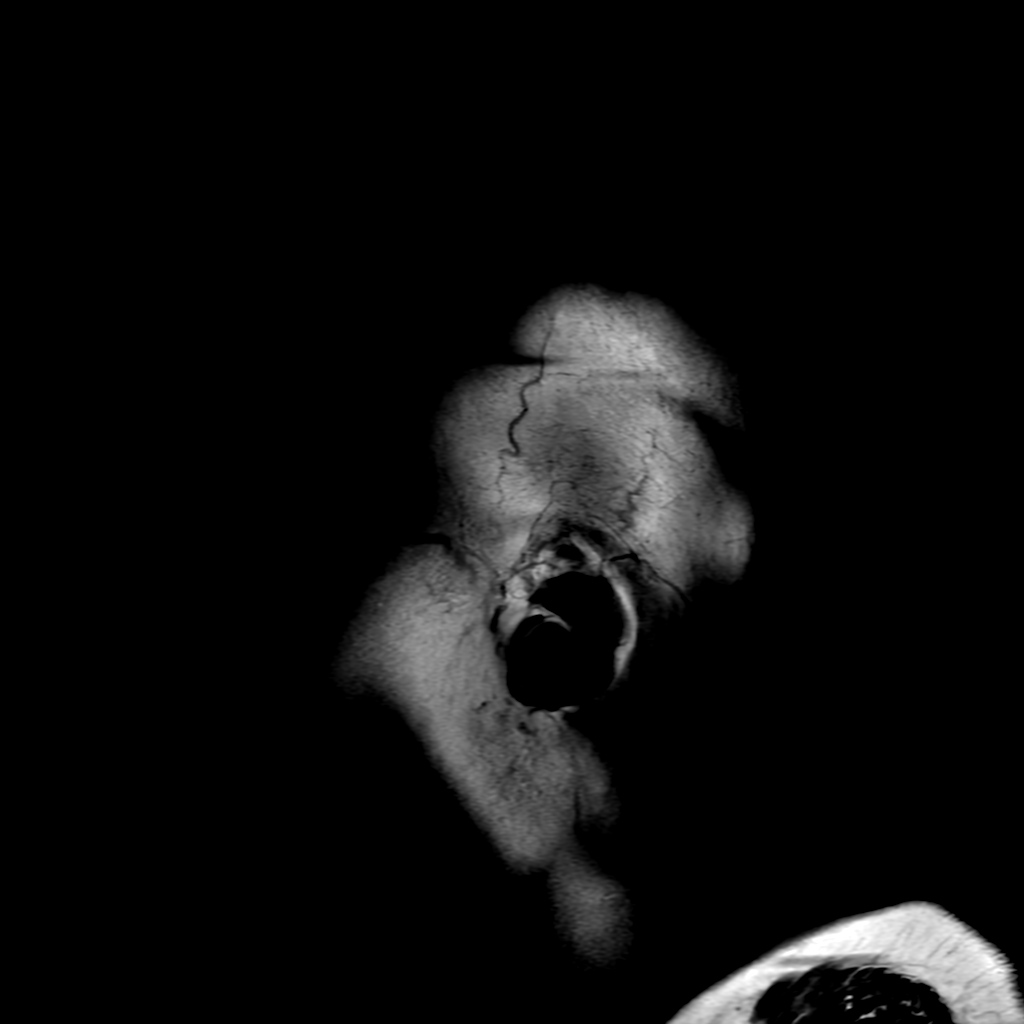
[im 23/23]
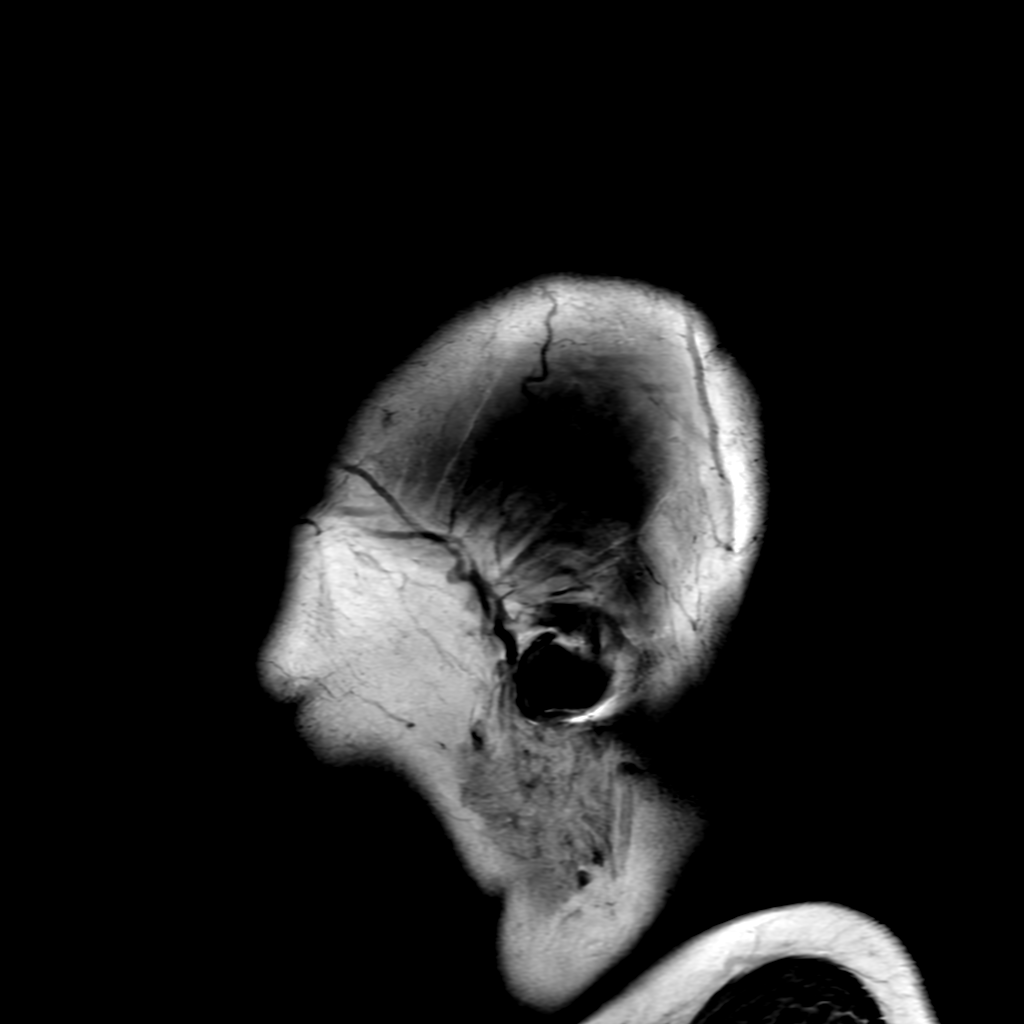

[Series 5: T2 · axial · 5.0mm · 0.47mm/px · z∈[-107,+55]mm · 2 of 28 slices shown]
[im 1/28]
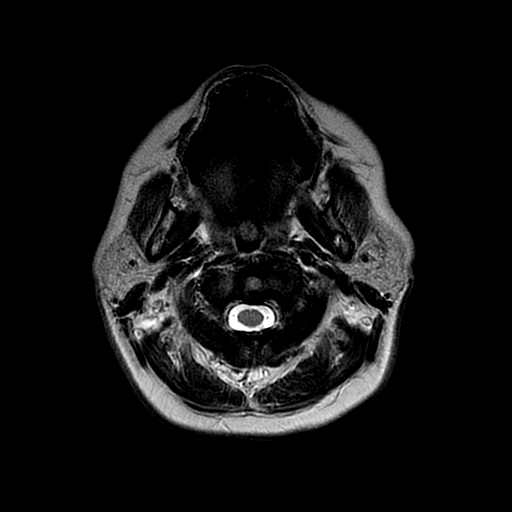
[im 28/28]
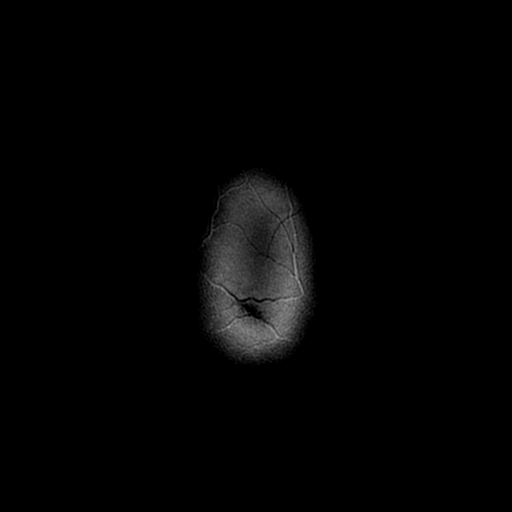

[Series 6: FLAIR · axial · 5.0mm · 0.47mm/px · z∈[-108,+54]mm · 2 of 28 slices shown (2 of 2)]
[im 1/28]
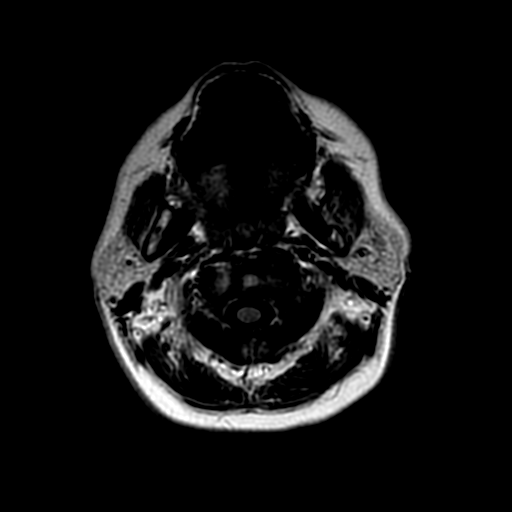
[im 28/28]
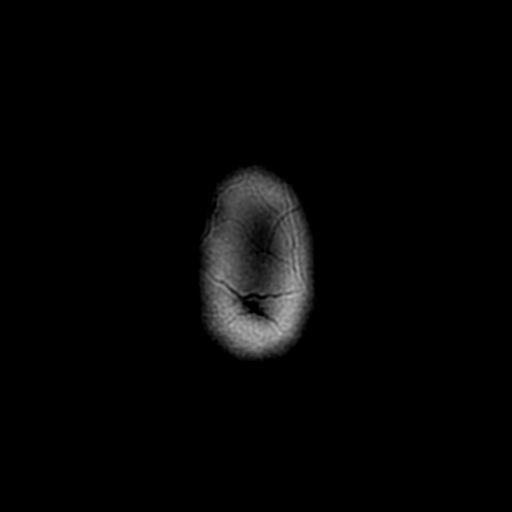

[Series 250: ADC · axial · 3.0mm · 0.94mm/px · z∈[-92,+55]mm · 4 of 50 slices shown (1 of 2)]
[im 1/50]
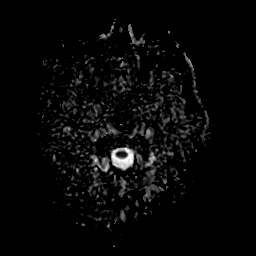
[im 17/50]
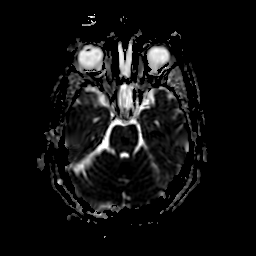
[im 33/50]
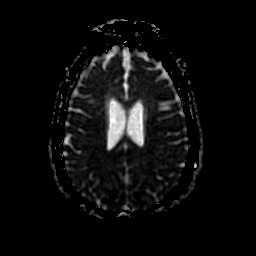
[im 50/50]
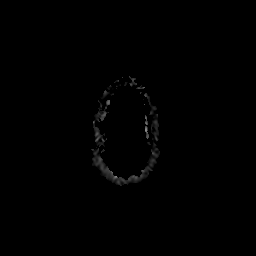

[Series 350: ADC · coronal · 4.0mm · 0.94mm/px · 3 of 35 slices shown (2 of 2)]
[im 1/35]
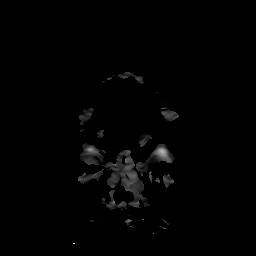
[im 18/35]
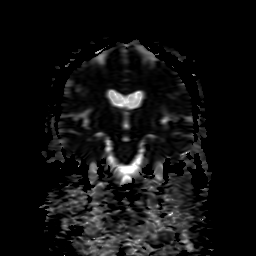
[im 35/35]
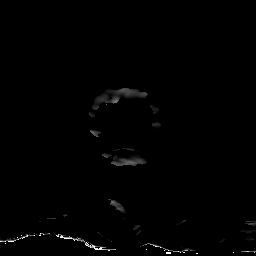

[28 of 48 positions shown; findings below may reference images not displayed]

FINDINGS: Brain: Ventricle size and cerebral volume normal for age. Negative
for acute infarct. Scattered tiny white matter hyperintensities
bilaterally. Brainstem and cerebellum normal. Negative for
hemorrhage or mass.

Normal enhancement pattern.

Vascular: Normal arterial flow voids.

Skull and upper cervical spine: No focal skeletal lesion.

Sinuses/Orbits: Negative

Other: None
IMPRESSION: No acute intracranial abnormality. No acute infarct. Scattered tiny
white matter hyperintensities likely reflect chronic microvascular
ischemia.

## 2020-01-29 MED ORDER — GADOBUTROL 1 MMOL/ML IV SOLN
10.0000 mL | Freq: Once | INTRAVENOUS | Status: AC | PRN
  Administered 2020-01-29: 10 mL via INTRAVENOUS

## 2020-01-29 MED ORDER — SULFAMETHOXAZOLE-TRIMETHOPRIM 800-160 MG PO TABS
1.0000 | ORAL_TABLET | Freq: Two times a day (BID) | ORAL | Status: AC
  Administered 2020-01-29 – 2020-01-30 (×3): 1 via ORAL
  Filled 2020-01-29 (×3): qty 1

## 2020-01-29 MED ORDER — DEXTROSE IN LACTATED RINGERS 5 % IV SOLN: INTRAVENOUS | Status: AC

## 2020-01-29 MED ORDER — FOLIC ACID 1 MG PO TABS
1.0000 mg | ORAL_TABLET | Freq: Every day | ORAL | Status: DC
  Administered 2020-01-30: 1 mg via ORAL
  Filled 2020-01-29: qty 1

## 2020-01-29 MED ORDER — THIAMINE HCL 100 MG PO TABS
100.0000 mg | ORAL_TABLET | Freq: Every day | ORAL | Status: DC
  Administered 2020-01-30: 100 mg via ORAL
  Filled 2020-01-29: qty 1

## 2020-01-29 MED ORDER — SODIUM CHLORIDE 0.9 % IV SOLN: 2.0000 g | Freq: Three times a day (TID) | INTRAVENOUS | Status: DC

## 2020-01-29 NOTE — Progress Notes (Signed)
Patients brother Minerva Areola called with an update on patient. All questions answered.

## 2020-01-29 NOTE — Progress Notes (Addendum)
   Subjective:   Patient seen on rounds resting. She wakes up easily to voice. She knows where she is but unable to tell me the year or date of birth. She denies pain, shortness of breath, or nausea.   Objective:  Vital signs in last 24 hours: Vitals:   01/28/20 1614 01/28/20 2000 01/29/20 0000 01/29/20 0429  BP: (!) 166/86 (!) 177/96 (!) 191/93 (!) 160/118  Pulse: (!) 110 (!) 106 96 70  Resp: 20 19 14 18   Temp: 99 F (37.2 C) 99 F (37.2 C) 98.7 F (37.1 C) 98.1 F (36.7 C)  TempSrc: Axillary Oral Axillary Axillary  SpO2: 94% 94% 100% 96%  Weight:       Constitution: NAD, resting in bed Cardio: RRR, no m/r/g, no LE edema, +pedal pulses  Respiratory: CTA, no w/r/r Abdominal: NTTP, soft, non-distended MSK: moving all extremities, no tenderness UE bilaterally or LE, no tightness, endorses sensation in fingers and feet Neuro: wakes to voice, oriented to time and self Skin: c/d/i   Assessment/Plan:  Active Problems:   Altered mental state   Hypokalemia   Tachycardia  64 y.o. with a pertinent PMH of opioid use now on buprenorphine, seizures (previously on phenobarbitol years ago), depression, and anxiety, who presented with N/V/D and admitted for like benzodiazepine withdrawal with sign and symptoms concerning for rhabdomyolysis.   Acute Encephalopathy Likely secondary to benzodiazepine withdrawal with possible serotonin syndrome as well. She is much improved today after starting valium yesterday. Tachycardia, tachypnea, mostly resolved. EEG without acute seizure like activity.   - continue CIWA - continue valium 5 mg q6 today, she is still requiring 2mg  ativan intermittently, will likely start to taper valium tomorrow  - cont. Thiamine and folic acid  - MRI pending - will likely not need this if symptoms continue to improve  - continue to hold meds that increase serotonin  Rhabdo She is s/p 3L and 200cc/hr >24 hours. CK <4,000. Will continue maintenance fluids as she is  still not taking food po. No signs or symptoms of compartment syndrome.   - D5 LR 75cc/hr - stop trending CK  Opioid Use Disorder Cont. subetex 2 mg qd  Hypokalemia - resolved Hypomagnesemia - add on labs Hypophosphatemia - add on labs  UTI Growing gram negative rods. Patient with dysuria at admission. Blood cx NGTD  - discontinue aztreonam  - complete therapy with bactrim, will f/u sensitivities   Mild Colitis On CT with abdominal tenderness as well. Blood cx NGTD   - complete four days metronidzole  VTE: lovenox IVF: D5 LR 75 cc/hr Diet: regular Code: full  Dispo: Anticipated discharge pending medical work and stabilization.   Zafar Debrosse A, DO 01/29/2020, 6:50 AM Pager: [phone] After 5pm on weekdays and 1pm on weekends: On Call Pager: [phone]

## 2020-01-30 LAB — COMPREHENSIVE METABOLIC PANEL
ALT: 28 U/L (ref 0–44)
AST: 64 U/L — ABNORMAL HIGH (ref 15–41)
Albumin: 3.7 g/dL (ref 3.5–5.0)
Alkaline Phosphatase: 75 U/L (ref 38–126)
Anion gap: 13 (ref 5–15)
BUN: 5 mg/dL — ABNORMAL LOW (ref 8–23)
CO2: 29 mmol/L (ref 22–32)
Calcium: 9 mg/dL (ref 8.9–10.3)
Chloride: 97 mmol/L — ABNORMAL LOW (ref 98–111)
Creatinine, Ser: 0.61 mg/dL (ref 0.44–1.00)
GFR calc Af Amer: >60 mL/min (ref >60)
GFR calc non Af Amer: >60 mL/min (ref >60)
Glucose, Bld: 102 mg/dL — ABNORMAL HIGH (ref 70–99)
Potassium: 2.9 mmol/L — ABNORMAL LOW (ref 3.5–5.1)
Sodium: 139 mmol/L (ref 135–145)
Total Bilirubin: 0.9 mg/dL (ref 0.3–1.2)
Total Protein: 8 g/dL (ref 6.5–8.1)

## 2020-01-30 LAB — URINE CULTURE: Culture: >=100000 — AB

## 2020-01-30 LAB — GLUCOSE, CAPILLARY: Glucose-Capillary: 94 mg/dL (ref 70–99)

## 2020-01-30 LAB — CBC
HCT: 43.3 % (ref 36.0–46.0)
Hemoglobin: 14.2 g/dL (ref 12.0–15.0)
MCH: 31.4 pg (ref 26.0–34.0)
MCHC: 32.8 g/dL (ref 30.0–36.0)
MCV: 95.8 fL (ref 80.0–100.0)
Platelets: 326 10*3/uL (ref 150–400)
RBC: 4.52 MIL/uL (ref 3.87–5.11)
RDW: 12.9 % (ref 11.5–15.5)
WBC: 10.6 10*3/uL — ABNORMAL HIGH (ref 4.0–10.5)
nRBC: 0 % (ref 0.0–0.2)

## 2020-01-30 LAB — MAGNESIUM: Magnesium: 1.9 mg/dL (ref 1.7–2.4)

## 2020-01-30 LAB — PHOSPHORUS: Phosphorus: 3.5 mg/dL (ref 2.5–4.6)

## 2020-01-30 MED ORDER — CLONAZEPAM 0.5 MG PO TABS: 0.5000 mg | ORAL_TABLET | Freq: Three times a day (TID) | ORAL | 0 refills | Status: DC | PRN

## 2020-01-30 MED ORDER — DIAZEPAM 2 MG PO TABS
2.0000 mg | ORAL_TABLET | Freq: Four times a day (QID) | ORAL | Status: DC
  Administered 2020-01-30: 2 mg via ORAL
  Filled 2020-01-30: qty 1

## 2020-01-30 MED ORDER — POTASSIUM CHLORIDE CRYS ER 20 MEQ PO TBCR
40.0000 meq | EXTENDED_RELEASE_TABLET | ORAL | Status: AC
  Administered 2020-01-30 (×2): 40 meq via ORAL
  Filled 2020-01-30 (×2): qty 2

## 2020-01-30 MED ORDER — KCL-LACTATED RINGERS-D5W 20 MEQ/L IV SOLN
INTRAVENOUS | Status: DC
  Filled 2020-01-30: qty 1000

## 2020-01-30 MED ORDER — CLONAZEPAM 0.5 MG PO TABS: 0.5000 mg | ORAL_TABLET | Freq: Two times a day (BID) | ORAL | Status: DC

## 2020-01-30 MED ORDER — NITROFURANTOIN MONOHYD MACRO 100 MG PO CAPS
100.0000 mg | ORAL_CAPSULE | Freq: Two times a day (BID) | ORAL | Status: DC
  Administered 2020-01-30: 100 mg via ORAL
  Filled 2020-01-30 (×2): qty 1

## 2020-01-30 MED ORDER — METRONIDAZOLE 500 MG PO TABS
500.0000 mg | ORAL_TABLET | Freq: Three times a day (TID) | ORAL | Status: DC
  Administered 2020-01-30: 500 mg via ORAL
  Filled 2020-01-30: qty 1

## 2020-01-30 MED ORDER — POTASSIUM CHLORIDE CRYS ER 20 MEQ PO TBCR
40.0000 meq | EXTENDED_RELEASE_TABLET | Freq: Two times a day (BID) | ORAL | Status: DC
  Filled 2020-01-30: qty 2

## 2020-01-30 MED ORDER — NITROFURANTOIN MONOHYD MACRO 100 MG PO CAPS: 100.0000 mg | ORAL_CAPSULE | Freq: Two times a day (BID) | ORAL | 0 refills | Status: AC

## 2020-01-30 NOTE — TOC Transition Note (Addendum)
Transition of Care [facility]) - CM/SW Discharge Note   Patient Details  Name: Stephanie Carrillo MRN: [id number] Date of Birth: [birth year]  Transition of Care [facility] CM/SW Contact:  Kermit Balo, RN Phone Number: 01/30/2020, 1:30 PM   Clinical Narrative:    Pt discharging home with [facility] services through [facility]. Cindy with [facility] accepted the referral.  No DME needs.  PCP: Dr Carmel [place] at [facility] Pt has a friend at the bedside that is going to stay with her for several days.  TOC consulted for Substance abuse. Pt has been off recreational drugs for 2 years and takes subutex currently. She denies any relapses and UDS negative.  Pt has transportation home when d/ced.    Final next level of care: Home w Home Health Services Barriers to Discharge: No Barriers Identified   Patient Goals and CMS Choice   CMS Medicare.gov Compare Post Acute Care list provided to:: Patient Choice offered to / list presented to : Patient  Discharge Placement                       Discharge Plan and Services                          HH Arranged: PT [facility] Agency: [facility] Date [facility] Agency Contacted: 01/30/20   Representative spoke with at [facility] Agency: Arline Asp  Social Determinants of Health (SDOH) Interventions     Readmission Risk Interventions No flowsheet data found.

## 2020-01-30 NOTE — Progress Notes (Signed)
Pt educated on changes in medications. Pt verbalizes understanding and acknowledges the need to follow up with home health upon discharge. PIV's removed. Pt belongings sent home with patient. Pt being discharged at this time.

## 2020-01-30 NOTE — Progress Notes (Signed)
PHARMACIST - PHYSICIAN COMMUNICATION DR:   Danise Edge CONCERNING: Antibiotic IV to Oral Route Change Policy  RECOMMENDATION: This patient is receiving metronidazole by the intravenous route.  Based on criteria approved by the Pharmacy and Therapeutics Committee, the antibiotic(s) is/are being converted to the equivalent oral dose form(s).   DESCRIPTION: These criteria include:  Patient being treated for a respiratory tract infection, urinary tract infection, cellulitis or clostridium difficile associated diarrhea if on metronidazole  The patient is not neutropenic and does not exhibit a GI malabsorption state  The patient is eating (either orally or via tube) and/or has been taking other orally administered medications for a least 24 hours  The patient is improving clinically and has a Tmax < 100.5  If you have questions about this conversion, please contact the Pharmacy Department  []   [phone] )  [phone] []   [phone] )  [facility] [x]   [phone] )   [facility] []   [phone] )  [facility] []   [phone] )  [facility]

## 2020-01-30 NOTE — Progress Notes (Signed)
HD#3 Subjective:  Overnight Events: none   She is feeling much better today and wants to go home. She is alert and orientedx3. She cannot remember what led her to need to come into the hospital. Discussed that she may have gone into withdrawal from stopping her klonipin. Discussed the dangers of stopping benzodiazepines suddenly.  She says she never stopped taking the klonipin   Objective:  Vital signs in last 24 hours: Vitals:   01/29/20 1620 01/29/20 1925 01/30/20 0000 01/30/20 0400  BP: (!) 165/111   (!) 167/83  Pulse: 96   89  Resp: 13   12  Temp: 98.7 F (37.1 C) 98.8 F (37.1 C) 98 F (36.7 C) 98.5 F (36.9 C)  TempSrc: Oral  Axillary Oral  SpO2: 96%   92%  Weight:       Supplemental O2: Room Air SpO2: 92 % O2 Flow Rate (L/min): n/a  Physical Exam:  Physical Exam Constitutional:      Appearance: Normal appearance.  HENT:     Head: Normocephalic and atraumatic.  Eyes:     Extraocular Movements: Extraocular movements intact.  Cardiovascular:     Rate and Rhythm: Normal rate.     Pulses: Normal pulses.     Heart sounds: Normal heart sounds.  Pulmonary:     Effort: Pulmonary effort is normal.     Breath sounds: Normal breath sounds.  Abdominal:     General: Bowel sounds are normal.     Palpations: Abdomen is soft.     Tenderness: There is no abdominal tenderness.  Musculoskeletal:        General: Normal range of motion.     Cervical back: Normal range of motion.     Right lower leg: No edema.     Left lower leg: No edema.  Skin:    General: Skin is warm and dry.  Neurological:     Mental Status: She is alert and oriented to person, place, and time. Mental status is at baseline.  Psychiatric:        Mood and Affect: Mood normal.     Filed Weights   01/28/20 0209 01/28/20 0221  Weight: 90 kg 90 kg     Intake/Output Summary (Last 24 hours) at 01/30/2020 1610 Last data filed at 01/30/2020 0129 Gross per 24 hour  Intake 696.83 ml  Output 2700 ml    Net -2003.17 ml   Net IO Since Admission: -3,163.68 mL [01/30/20 0608]  Pertinent Labs: CBC Latest Ref Rng & Units 01/30/2020 01/29/2020 01/28/2020  WBC 4.0 - 10.5 K/uL 10.6(H) 14.3(H) 18.1(H)  Hemoglobin 12.0 - 15.0 g/dL 96.0 45.4 09.8  Hematocrit 36 - 46 % 43.3 43.3 42.0  Platelets 150 - 400 K/uL 326 250 271    CMP Latest Ref Rng & Units 01/30/2020 01/29/2020 01/28/2020  Glucose 70 - 99 mg/dL 119(J) 96 47(W)  BUN 8 - 23 mg/dL 5(L) 6(L) 8  Creatinine 0.44 - 1.00 mg/dL 2.95 6.21 3.08  Sodium 135 - 145 mmol/L 139 141 140  Potassium 3.5 - 5.1 mmol/L 2.9(L) 3.9 4.0  Chloride 98 - 111 mmol/L 97(L) 101 101  CO2 22 - 32 mmol/L 29 27 24   Calcium 8.9 - 10.3 mg/dL 9.0 ) 6.5(H)  Total Protein 6.5 - 8.1 g/dL 8.0 7.0 -  Total Bilirubin 0.3 - 1.2 mg/dL 0.9 0.9 -  Alkaline Phos 38 - 126 U/L 75 62 -  AST 15 - 41 U/L 64(H) 84(H) -  ALT 0 - 44  U/L 28 26 -    Pending Labs: none  Imaging: MR BRAIN W WO CONTRAST  Result Date: 01/29/2020 CLINICAL DATA:  Encephalopathy confusion EXAM: MRI HEAD WITHOUT AND WITH CONTRAST TECHNIQUE: Multiplanar, multiecho pulse sequences of the brain and surrounding structures were obtained without and with intravenous contrast. CONTRAST:  41mL GADAVIST GADOBUTROL 1 MMOL/ML IV SOLN COMPARISON:  CT head 01/27/2020 FINDINGS: Brain: Ventricle size and cerebral volume normal for age. Negative for acute infarct. Scattered tiny white matter hyperintensities bilaterally. Brainstem and cerebellum normal. Negative for hemorrhage or mass. Normal enhancement pattern. Vascular: Normal arterial flow voids. Skull and upper cervical spine: No focal skeletal lesion. Sinuses/Orbits: Negative Other: None IMPRESSION: No acute intracranial abnormality. No acute infarct. Scattered tiny white matter hyperintensities likely reflect chronic microvascular ischemia. Electronically Signed   By: Marlan Palau M.D.   On: 01/29/2020 13:17    Assessment/Plan:   Active Problems:   Altered mental  state   Hypokalemia   Tachycardia    has a past medical history of Anxiety, Depression, Hypertension, Insomnia, Meningitis, Seizures (HCC), and Substance abuse (HCC).  Patient Summary: Stephanie Carrillo is a 64 y.o. with a pertinent PMH of buprenorphine, seizures (previously on phenobarbitol years ago), depression, and anxiety,who presented with N/V/D, and admitted for  admitted for like benzodiazepine withdrawal with sign and symptoms concerning for rhabdomyolysis..   She is on hospital day 3 and is much improved.  Acute Encephalopathy 2/2 to benzodiazepine overdose.   Patient is much improved. She is alert and oriented and tolerating po medications well. Patient is likely ready for discharge pending PT evaluation.   - continue CIWA - Restart home dose of clonazepam today.  - Cont Thiamine and folic acid  - Cont to hold SSRI  Rhabdo - Resolved.  Opioid Use Disorder - Cont. subetex 2 mg qd  Hypokalemia  Hypomagnesemia  Hypophosphatemia  - Replete as needed.  UTI 2/2 E. Coli Patient's urine grew GNR found to be Ecoli and is on Bactrim. Sensitives show resistance to bactrim. Will switch her to Keflex for the remainder of her 4 days of antibiotic therapy.  - Switch to Nitrofurantoin 100 mg q 6 hours for 2 days to complete a 5 day course   Mild Colitis On CT with abdominal tenderness as well. Blood cx NGTD   - complete four days metronidazole course   Diet: Normal IVF: None,None VTE: Enoxaparin Code: Full PT/OT recs: Pending, pending. TOC recs: pending   Dispo: Anticipated discharge to pending in 0-1 days pending PT evaluation.    Dellia Cloud, D.O. [facility]IM[facility], PGY-1 Date 01/30/2020 Time 6:08 AM Pager: [phone] Please contact the on call pager after 5 pm and on weekends at [phone].

## 2020-01-30 NOTE — Evaluation (Signed)
Physical Therapy Evaluation Patient Details Name: Stephanie Carrillo MRN: [id number] DOB: 02-14-1956 Today's Date: 01/30/2020   History of Present Illness  Stephanie Carrillo is a 64 y.o. with a pertinent PMH of buprenorphine, seizures (previously on phenobarbitol years ago), depression, and anxiety, who presented with N/V/D, and admitted for  admitted for like benzodiazepine withdrawal with sign and symptoms concerning for rhabdomyolysis..  Clinical Impression  Patient presents with mobility slightly off from baseline due to current illness/hospitalization.  Friend at the bedside who plans to stay with her initially and discussed some safety concerns without having full cognitive eval for medication management, and IADL's.  Feel she is mobilizing at S level with some fall risk in community demonstrated on DGI (18/24, scores <19 demonstrate fall risk in older community living adults,) and should continue to progress with follow up HHPT.  Also discussed fall precautions with patient including nonskid mat in shower, nonskid footwear on when up and lighting, sitting several moments prior to standing, etc. Feel no further skilled acute PT needs, do recommend follow up HHPT. Also reported to RN patient's HR up to 142 with ambulation, but denied symptoms.     Follow Up Recommendations Home health PT;Supervision/Assistance - 24 hour (initial 24 hour S)    Equipment Recommendations  None recommended by PT    Recommendations for Other Services       Precautions / Restrictions Precautions Precautions: Fall      Mobility  Bed Mobility Overal bed mobility: Modified Independent                Transfers Overall transfer level: Modified independent Equipment used: None                Ambulation/Gait Ambulation/Gait assistance: Supervision;Min guard Gait Distance (Feet): 180 Feet Assistive device: None Gait Pattern/deviations: Step-through pattern;Decreased stride length     General Gait  Details: some veering L with head turn L and noted not able to significantly change gait speed when asked, HR up to 142 with ambulation and BP post ambulation 163/114, RN aware. SpO2 95%  Stairs            Wheelchair Mobility    Modified Rankin (Stroke Patients Only)       Balance Overall balance assessment: Needs assistance   Sitting balance-Leahy Scale: Normal Sitting balance - Comments: dons socks seated unaided   Standing balance support: No upper extremity supported Standing balance-Leahy Scale: Good Standing balance comment: static balance without issues                 Standardized Balance Assessment Standardized Balance Assessment : Dynamic Gait Index   Dynamic Gait Index Level Surface: Mild Impairment Change in Gait Speed: Mild Impairment Gait with Horizontal Head Turns: Mild Impairment Gait with Vertical Head Turns: Normal Gait and Pivot Turn: Mild Impairment Step Over Obstacle: Mild Impairment Step Around Obstacles: Normal Steps: Mild Impairment Total Score: 18       Pertinent Vitals/Pain Pain Assessment: Faces Faces Pain Scale: Hurts little more Pain Location: headache Pain Descriptors / Indicators: Headache Pain Intervention(s): Monitored during session;RN gave pain meds during session    Home Living Family/patient expects to be discharged to:: Private residence Living Arrangements: Alone Available Help at Discharge: Friend(s);Available 24 hours/day (initially) Type of Home: Apartment Home Access: Level entry     Home Layout: One level Home Equipment: Grab bars - tub/shower      Prior Function Level of Independence: Independent         Comments: not  driving, orders groceries for delivery, friend drives to appointments, brother also in room and assists, but may not be local     Hand Dominance        Extremity/Trunk Assessment   Upper Extremity Assessment Upper Extremity Assessment: Overall WFL for tasks assessed     Lower Extremity Assessment Lower Extremity Assessment: Overall WFL for tasks assessed       Communication   Communication: No difficulties  Cognition Arousal/Alertness: Awake/alert Behavior During Therapy: Anxious Overall Cognitive Status: Within Functional Limits for tasks assessed                                 General Comments: able to answer general orientation questions correctly, did not test sequencing, memory or ability appropriate med management      General Comments      Exercises     Assessment/Plan    PT Assessment All further PT needs can be met in the next venue of care  PT Problem List         PT Treatment Interventions      PT Goals (Current goals can be found in the Care Plan section)  Acute Rehab PT Goals Patient Stated Goal: To go home today PT Goal Formulation: All assessment and education complete, DC therapy    Frequency     Barriers to discharge        Co-evaluation               AM-PAC PT "6 Clicks" Mobility  Outcome Measure Help needed turning from your back to your side while in a flat bed without using bedrails?: None Help needed moving from lying on your back to sitting on the side of a flat bed without using bedrails?: None Help needed moving to and from a bed to a chair (including a wheelchair)?: None Help needed standing up from a chair using your arms (e.g., wheelchair or bedside chair)?: None Help needed to walk in hospital room?: A Little Help needed climbing 3-5 steps with a railing? : A Little 6 Click Score: 22    End of Session   Activity Tolerance: Other (comment) (seemed to tolerate well without complaints, but HR up to 143, RN aware) Patient left: in bed;with family/visitor present;with call bell/phone within reach   PT Visit Diagnosis: Other abnormalities of gait and mobility (R26.89);Difficulty in walking, not elsewhere classified (R26.2)    Time: 5749-3552 PT Time Calculation (min) (ACUTE  ONLY): 31 min   Charges:   PT Evaluation $PT Eval Low Complexity: 1 Low PT Treatments $Self Care/Home Management: 8-22        Magda Kiel, PT Acute Rehabilitation Services Pager:[phone] Office:[phone] 01/30/2020   Reginia Naas 01/30/2020, 1:24 PM

## 2020-02-01 DIAGNOSIS — M6282 Rhabdomyolysis: Secondary | ICD-10-CM | POA: Diagnosis not present

## 2020-02-01 DIAGNOSIS — F13939 Sedative, hypnotic or anxiolytic use, unspecified with withdrawal, unspecified: Secondary | ICD-10-CM

## 2020-02-01 DIAGNOSIS — E876 Hypokalemia: Secondary | ICD-10-CM | POA: Diagnosis not present

## 2020-02-01 DIAGNOSIS — F419 Anxiety disorder, unspecified: Secondary | ICD-10-CM | POA: Diagnosis not present

## 2020-02-01 DIAGNOSIS — Z602 Problems related to living alone: Secondary | ICD-10-CM | POA: Diagnosis not present

## 2020-02-01 DIAGNOSIS — G40909 Epilepsy, unspecified, not intractable, without status epilepticus: Secondary | ICD-10-CM | POA: Diagnosis not present

## 2020-02-01 DIAGNOSIS — G9349 Other encephalopathy: Secondary | ICD-10-CM | POA: Diagnosis not present

## 2020-02-01 DIAGNOSIS — R000 Tachycardia, unspecified: Secondary | ICD-10-CM | POA: Diagnosis not present

## 2020-02-01 DIAGNOSIS — Z87440 Personal history of urinary (tract) infections: Secondary | ICD-10-CM | POA: Diagnosis not present

## 2020-02-01 DIAGNOSIS — R471 Dysarthria and anarthria: Secondary | ICD-10-CM | POA: Diagnosis not present

## 2020-02-01 DIAGNOSIS — Z9181 History of falling: Secondary | ICD-10-CM | POA: Diagnosis not present

## 2020-02-01 DIAGNOSIS — I10 Essential (primary) hypertension: Secondary | ICD-10-CM | POA: Diagnosis not present

## 2020-02-01 DIAGNOSIS — Z87891 Personal history of nicotine dependence: Secondary | ICD-10-CM | POA: Diagnosis not present

## 2020-02-01 DIAGNOSIS — F329 Major depressive disorder, single episode, unspecified: Secondary | ICD-10-CM | POA: Diagnosis not present

## 2020-02-01 DIAGNOSIS — G4700 Insomnia, unspecified: Secondary | ICD-10-CM | POA: Diagnosis not present

## 2020-02-01 LAB — CULTURE, BLOOD (ROUTINE X 2)
Culture: NO GROWTH
Culture: NO GROWTH
Special Requests: ADEQUATE

## 2020-02-01 NOTE — Discharge Summary (Signed)
Name: Stephanie Carrillo MRN: [id number] DOB: [birth year] 64 y.o. PCP: Patient, No Pcp Per  Date of Admission: 01/27/2020  9:54 AM Date of Discharge: 01/30/2020 Attending Physician: Dr. Heide Carrillo  Discharge Diagnosis: Active Problems:   Altered mental state   Hypokalemia   Tachycardia    Discharge Medications: Allergies as of 01/30/2020      Reactions   Ciprofloxacin Shortness Of Breath, Other (See Comments)   Respiratory arrest   Penicillins Shortness Of Breath, Rash   Did it involve swelling of the face/tongue/throat, SOB, or low BP? y Did it involve sudden or severe rash/hives, skin peeling, or any reaction on the inside of your mouth or nose? y Did you need to seek medical attention at a hospital or doctor's office? n When did it last happen?1985 If all above answers are "NO", may proceed with cephalosporin use.   Oxycodone Other (See Comments)   Patient in recovery process.      Medication List    STOP taking these medications   FLUoxetine 40 MG capsule Commonly known as: PROZAC     TAKE these medications   amLODipine 5 MG tablet Commonly known as: NORVASC Take 1 tablet (5 mg total) by mouth daily.   buprenorphine 2 MG Subl SL tablet Commonly known as: SUBUTEX Place 2 mg under the tongue every 4 (four) hours as needed for anxiety. LF on 01-05-20 # 180 DS 30 12 mg dose daily   clonazePAM 0.5 MG tablet Commonly known as: KLONOPIN Take 1 tablet (0.5 mg total) by mouth 3 (three) times daily as needed for anxiety. What changed: when to take this   diphenhydrAMINE 25 MG tablet Commonly known as: BENADRYL Take 25 mg by mouth at bedtime as needed for sleep.   gabapentin 800 MG tablet Commonly known as: NEURONTIN Take 800 mg by mouth 3 (three) times daily.   ibuprofen 100 MG tablet Commonly known as: ADVIL Take 200 mg by mouth 2 (two) times daily as needed for pain.   ondansetron 4 MG tablet Commonly known as: ZOFRAN Take 1 tablet (4 mg total) by mouth every 6  (six) hours as needed for nausea.   tiZANidine 4 MG capsule Commonly known as: ZANAFLEX Take 4 mg by mouth 3 (three) times daily.     ASK your doctor about these medications   nitrofurantoin (macrocrystal-monohydrate) 100 MG capsule Commonly known as: MACROBID Take 1 capsule (100 mg total) by mouth 2 (two) times daily for 1 day. Ask about: Should I take this medication?       Disposition and follow-up:   Ms.Stephanie Carrillo was discharged from [facility] in Good condition.  At the hospital follow up visit please address:  1.  Follow-up:  A. Withdrawal - make sure patient is consistently taking her clonazepam or is titrated off the medication.    B. UTI - make sure she finished her antibiotics   2.  Labs / imaging needed at time of follow-up: CBC. BMP  3.  Pending labs/ test needing follow-up: none  Follow-up Appointments:  Follow-up Information    [facility], [facility] Follow up.   Specialty: Home Health Services Why: The home health agency will contact you for the first home visit. Contact information: [address] [place] [address] [phone]               Hospital Course by problem list: Stephanie Carrillo is a 11 y.o. with a pertinent PMH of opioid use now on buprenorphine, seizures (  previously on phenobarbitol years ago), depression, and anxiety, who presented with N/V/D and admitted for like benzodiazepine withdrawal with sign and symptoms concerning for rhabdomyolysis.    She is on hospital day 1 with minimal improvement. She appears more alert and oriented today, but continues to experience substantial symptoms of benzodiazepine withdrawal including diaphoresis, anxiety, diffuse muscle pain and tachycardia.  It is difficult to exclude a component of serotonin syndrome with hyperreflexia, muscle spasms, and hyperthermia.  We will continue to treat both etiologies with diazepam and titrate the dose as needed to achieve symptom  medic relief.   It is difficult to rule out infection at this time and will continue antibiotic therapy with aztreonam and metronidazole.  Continue to trend blood and urine cultures and monitor for clinical signs of improvement.  However, I do believe that infection is less likely.   Patient also has evidence of rhabdomyolysis which makes me concerned for recent seizure versus prolonged immobilization.  We will continue to manage with IV fluids at 200 cc/h and trending urine output and CK level every 8-12 hours depending on the trend of the CK level.  There is no evidence of pigment induced nephropathy on CMP today.    Discharge Vitals:   BP (!) 163/98   Pulse (!) 103   Temp 98.7 F (37.1 C) (Oral)   Resp 15   Wt 90 kg   SpO2 93%   BMI 32.02 kg/m   Pertinent Labs, Studies, and Procedures:  CBC Latest Ref Rng & Units 01/30/2020 01/29/2020 01/28/2020  WBC 4.0 - 10.5 K/uL 10.6(H) 14.3(H) 18.1(H)  Hemoglobin 12.0 - 15.0 g/dL 32.2 02.5 42.7  Hematocrit 36 - 46 % 43.3 43.3 42.0  Platelets 150 - 400 K/uL 326 250 271    CMP Latest Ref Rng & Units 01/30/2020 01/29/2020 01/28/2020  Glucose 70 - 99 mg/dL 062(B) 96 76(E)  BUN 8 - 23 mg/dL 5(L) 6(L) 8  Creatinine 0.44 - 1.00 mg/dL 8.31 5.17 6.16  Sodium 135 - 145 mmol/L 139 141 140  Potassium 3.5 - 5.1 mmol/L 2.9(L) 3.9 4.0  Chloride 98 - 111 mmol/L 97(L) 101 101  CO2 22 - 32 mmol/L 29 27 24   Calcium 8.9 - 10.3 mg/dL 9.0 ) 0.7(P)  Total Protein 6.5 - 8.1 g/dL 8.0 7.0 -  Total Bilirubin 0.3 - 1.2 mg/dL 0.9 0.9 -  Alkaline Phos 38 - 126 U/L 75 62 -  AST 15 - 41 U/L 64(H) 84(H) -  ALT 0 - 44 U/L 28 26 -    CT Head Wo Contrast  Result Date: 01/27/2020 CLINICAL DATA:  Altered mental status with nausea and vomiting. EXAM: CT HEAD WITHOUT CONTRAST CT ABDOMEN AND PELVIS WITH CONTRAST TECHNIQUE: Contiguous axial images were obtained from the base of the skull through the vertex without intravenous contrast. Multidetector CT imaging of the  abdomen and pelvis was performed during intravenous contrast administration. CONTRAST:  01/29/2020 OMNIPAQUE IOHEXOL 300 MG/ML  SOLN COMPARISON:  September 22, 2018 FINDINGS: CT HEAD FINDINGS Brain: No evidence of acute infarction, hemorrhage, hydrocephalus, extra-axial collection or mass lesion/mass effect. Vascular: No hyperdense vessel or unexpected calcification. Skull: Normal. Negative for fracture or focal lesion. Sinuses/Orbits: No acute finding. Other: The study is limited secondary to patient motion. CT ABDOMEN AND PELVIS FINDINGS Hepatobiliary: No focal liver lesions are identified. Gallbladder is normal in appearance without evidence of gallstones or gallbladder wall thickening. Pancreas: No mass or inflammatory process identified on this un-enhanced exam. Spleen: Within normal limits in size. Adrenals/Urinary  Tract: No evidence of urolithiasis or hydronephrosis. No definite mass visualized on this un-enhanced exam. Mild symmetric diffuse urinary bladder wall thickening is noted. Stomach/Bowel: No evidence of bowel dilatation. Mild diffuse thickening of the sigmoid colon is seen which is also decompressed. The appendix is not identified. Vascular/Lymphatic: No pathologically enlarged lymph nodes. No evidence of abdominal aortic aneurysm. Reproductive: The uterus is small in size and appears to be position to the right of midline. Musculoskeletal: Multilevel degenerative changes seen throughout the lumbar spine. Other: Mild atelectasis and/or infiltrate is seen within the bilateral lung bases, left greater than right. IMPRESSION: 1. Limited study secondary to patient motion. 2. No acute intracranial abnormality. 3. Mild diffuse thickening of the sigmoid colon which is also decompressed. This may represent a mild colitis. 4. Mild symmetric diffuse urinary bladder wall thickening. Correlation with urinalysis is recommended. 5. Mild atelectasis and/or infiltrate within the bilateral lung bases, left greater than  right. Electronically Signed   By: Aram Candela M.D.   On: 01/27/2020 15:52   CT ABDOMEN PELVIS W CONTRAST  Result Date: 01/27/2020 CLINICAL DATA:  Altered mental status with nausea and vomiting. EXAM: CT HEAD CT ABDOMEN AND PELVIS WITH CONTRAST TECHNIQUE: Multidetector CT imaging of the abdomen and pelvis was performed using the standard protocol following bolus administration of intravenous contrast. CONTRAST:  OMNIPAQUE IOHEXOL 300 MG/ML  SOLN COMPARISON:  None. FINDINGS: CT HEAD FINDINGS Brain: No evidence of acute infarction, hemorrhage, hydrocephalus, extra-axial collection or mass lesion/mass effect. Vascular: No hyperdense vessel or unexpected calcification. Skull: Normal. Negative for fracture or focal lesion. Sinuses/Orbits: No acute finding. Other: The study is limited secondary to patient motion. CT ABDOMEN AND PELVIS FINDINGS Hepatobiliary: No focal liver lesions are identified. Gallbladder is normal in appearance without evidence of gallstones or gallbladder wall thickening. Pancreas: No mass or inflammatory process identified on this un-enhanced exam. Spleen: Within normal limits in size. Adrenals/Urinary Tract: No evidence of urolithiasis or hydronephrosis. No definite mass visualized on this un-enhanced exam. Mild symmetric diffuse urinary bladder wall thickening is noted. Stomach/Bowel: No evidence of bowel dilatation. Mild diffuse thickening of the sigmoid colon is seen which is also decompressed. The appendix is not identified. Vascular/Lymphatic: No pathologically enlarged lymph nodes. No evidence of abdominal aortic aneurysm. Reproductive: The uterus is small in size and appears to be position to the right of midline. Musculoskeletal: Multilevel degenerative changes seen throughout the lumbar spine. Other: Mild atelectasis and/or infiltrate is seen within the bilateral lung bases, left greater than right. IMPRESSION: 1. Limited study secondary to patient motion. 2. No acute  intracranial abnormality. 3. Mild diffuse thickening of the sigmoid colon which is also decompressed. This may represent a mild colitis. 4. Mild symmetric diffuse urinary bladder wall thickening. Correlation with urinalysis is recommended. 5. Mild atelectasis and/or infiltrate within the bilateral lung bases, left greater than right. Electronically Signed   By: Aram Candela M.D.   On: 01/27/2020 15:54   DG Chest Port 1 View  Result Date: 01/27/2020 CLINICAL DATA:  Altered mental status. EXAM: PORTABLE CHEST 1 VIEW COMPARISON:  09/22/2018 FINDINGS: Cardiomediastinal contours and hilar structures are normal. Lungs are clear. Minimal central vascular engorgement. No sign of pleural effusion. Signs of cervical spine fusion as before. No acute bone abnormality to the extent evaluated. IMPRESSION: Minimal central vascular engorgement. No consolidation or pleural effusion. Electronically Signed   By: Donzetta Kohut M.D.   On: 01/27/2020 12:46   EEG adult  Result Date: 01/28/2020 Charlsie Quest, MD  01/28/2020  3:00 PM Patient Name: Vito BackersLisa Loken MRN: [id number] Epilepsy Attending: Charlsie QuestPriyanka O Yadav Referring Physician/Provider: Dr Guinevere ScarletJaimie Seawell Date: 01/28/2020 Duration: 23.25 mins Patient history: 64yo F with h/o seizures who presented with ams. EEG to evaluate for seizure. Level of alertness: Awake AEDs during EEG study: None Technical aspects: This EEG study was done with scalp electrodes positioned according to the 10-20 International system of electrode placement. Electrical activity was acquired at a sampling rate of 500Hz  and reviewed with a high frequency filter of 70Hz  and a low frequency filter of 1Hz . EEG data were recorded continuously and digitally stored. Description:  EEG showed continuous generalized 3 to 6 Hz theta-delta slowing.Triphasic waves, generalized were also noted.  Hyperventilation and photic stimulation were not performed.   ABNORMALITY -Continuous slow, generalized -Triphasic  waves, generalized IMPRESSION: This study is suggestive of moderate diffuse encephalopathy, nonspecific etiology but likely related to toxic-metabolic etiology. No seizures or definite epileptiform discharges were seen throughout the recording. Charlsie QuestPriyanka O Yadav     Discharge Instructions: Discharge Instructions    Diet - low sodium heart healthy   Complete by: As directed    Discharge instructions   Complete by: As directed    You were hospitalized for Acute Encephalopathy secondary to Benzodiazepine withdrawal. Thank you for allowing us to be part of your care.   Please arrange follow up with: 1. Your primary care provider with a week. 2. Your psychiatrist within a week.    Important notes about your medications: 1. Please start taking Nitrofurantoin 100 mg twice daily (once in the morning and once at night) for 1 day. 2. Please stop taking Fluoxetine until you have followed up with your PCP and/or your psychiatrist.  3. Please continue taking clonazepam 0.5 mg three time daily   Please make sure to follow up with your doctors.   Please call our clinic if you have any questions or concerns, we may be able to help and keep you from a long and expensive emergency room wait. Our clinic and after hours phone number is [phone], the best time to call is Monday through Friday 9 am to 4 pm but there is always someone available 24/7 if you have an emergency. If you need medication refills please notify your pharmacy one week in advance and they will send us a request.   Increase activity slowly   Complete by: As directed       Signed: Dellia Cloudoe, Tymon Nemetz, MD 02/01/2020, 4:56 PM   Pager: [phone]

## 2020-02-03 DIAGNOSIS — Z79899 Other long term (current) drug therapy: Secondary | ICD-10-CM | POA: Diagnosis not present

## 2020-02-03 DIAGNOSIS — Z09 Encounter for follow-up examination after completed treatment for conditions other than malignant neoplasm: Secondary | ICD-10-CM | POA: Diagnosis not present

## 2020-02-03 DIAGNOSIS — G894 Chronic pain syndrome: Secondary | ICD-10-CM | POA: Diagnosis not present

## 2020-02-03 DIAGNOSIS — I10 Essential (primary) hypertension: Secondary | ICD-10-CM | POA: Diagnosis not present

## 2020-02-20 DIAGNOSIS — G894 Chronic pain syndrome: Secondary | ICD-10-CM | POA: Diagnosis not present

## 2020-02-20 DIAGNOSIS — F3181 Bipolar II disorder: Secondary | ICD-10-CM | POA: Diagnosis not present

## 2020-02-20 DIAGNOSIS — F419 Anxiety disorder, unspecified: Secondary | ICD-10-CM | POA: Diagnosis not present

## 2020-02-20 DIAGNOSIS — F341 Dysthymic disorder: Secondary | ICD-10-CM | POA: Diagnosis not present

## 2020-02-23 ENCOUNTER — Emergency Department (HOSPITAL_COMMUNITY): Payer: Medicare Other

## 2020-02-23 ENCOUNTER — Observation Stay (HOSPITAL_COMMUNITY)
Admission: EM | Admit: 2020-02-23 | Discharge: 2020-02-24 | Disposition: A | Discharge status: Home / Self Care | Payer: Medicare Other | Attending: Emergency Medicine | Admitting: Emergency Medicine

## 2020-02-23 DIAGNOSIS — R809 Proteinuria, unspecified: Secondary | ICD-10-CM | POA: Insufficient documentation

## 2020-02-23 DIAGNOSIS — R739 Hyperglycemia, unspecified: Secondary | ICD-10-CM | POA: Insufficient documentation

## 2020-02-23 DIAGNOSIS — R4182 Altered mental status, unspecified: Secondary | ICD-10-CM | POA: Insufficient documentation

## 2020-02-23 DIAGNOSIS — Z87891 Personal history of nicotine dependence: Secondary | ICD-10-CM | POA: Diagnosis not present

## 2020-02-23 DIAGNOSIS — G9340 Encephalopathy, unspecified: Principal | ICD-10-CM | POA: Insufficient documentation

## 2020-02-23 DIAGNOSIS — R197 Diarrhea, unspecified: Secondary | ICD-10-CM

## 2020-02-23 DIAGNOSIS — F13239 Sedative, hypnotic or anxiolytic dependence with withdrawal, unspecified: Secondary | ICD-10-CM | POA: Diagnosis not present

## 2020-02-23 DIAGNOSIS — Z79899 Other long term (current) drug therapy: Secondary | ICD-10-CM | POA: Insufficient documentation

## 2020-02-23 DIAGNOSIS — R0902 Hypoxemia: Secondary | ICD-10-CM | POA: Diagnosis not present

## 2020-02-23 DIAGNOSIS — R000 Tachycardia, unspecified: Secondary | ICD-10-CM | POA: Diagnosis not present

## 2020-02-23 DIAGNOSIS — F13231 Sedative, hypnotic or anxiolytic dependence with withdrawal delirium: Secondary | ICD-10-CM | POA: Diagnosis not present

## 2020-02-23 DIAGNOSIS — R112 Nausea with vomiting, unspecified: Secondary | ICD-10-CM

## 2020-02-23 DIAGNOSIS — R0682 Tachypnea, not elsewhere classified: Secondary | ICD-10-CM | POA: Insufficient documentation

## 2020-02-23 DIAGNOSIS — G9341 Metabolic encephalopathy: Secondary | ICD-10-CM | POA: Diagnosis present

## 2020-02-23 DIAGNOSIS — Z20822 Contact with and (suspected) exposure to covid-19: Secondary | ICD-10-CM | POA: Insufficient documentation

## 2020-02-23 DIAGNOSIS — F13931 Sedative, hypnotic or anxiolytic use, unspecified with withdrawal delirium: Secondary | ICD-10-CM

## 2020-02-23 DIAGNOSIS — J984 Other disorders of lung: Secondary | ICD-10-CM | POA: Diagnosis not present

## 2020-02-23 DIAGNOSIS — I10 Essential (primary) hypertension: Secondary | ICD-10-CM | POA: Insufficient documentation

## 2020-02-23 DIAGNOSIS — R1110 Vomiting, unspecified: Secondary | ICD-10-CM | POA: Diagnosis not present

## 2020-02-23 LAB — RAPID URINE DRUG SCREEN, HOSP PERFORMED
Amphetamines: NOT DETECTED
Barbiturates: NOT DETECTED
Benzodiazepines: POSITIVE — AB
Cocaine: NOT DETECTED
Opiates: NOT DETECTED
Tetrahydrocannabinol: NOT DETECTED

## 2020-02-23 LAB — URINALYSIS, ROUTINE W REFLEX MICROSCOPIC
Bilirubin Urine: NEGATIVE
Glucose, UA: NEGATIVE mg/dL
Ketones, ur: 5 mg/dL — AB
Nitrite: NEGATIVE
Protein, ur: >=300 mg/dL — AB
Specific Gravity, Urine: 1.014 (ref 1.005–1.030)
pH: 6 (ref 5.0–8.0)

## 2020-02-23 LAB — CBC WITH DIFFERENTIAL/PLATELET
Abs Immature Granulocytes: 0.07 10*3/uL (ref 0.00–0.07)
Basophils Absolute: 0 10*3/uL (ref 0.0–0.1)
Basophils Relative: 0 %
Eosinophils Absolute: 0 10*3/uL (ref 0.0–0.5)
Eosinophils Relative: 0 %
HCT: 49.1 % — ABNORMAL HIGH (ref 36.0–46.0)
Hemoglobin: 16.1 g/dL — ABNORMAL HIGH (ref 12.0–15.0)
Immature Granulocytes: 0 %
Lymphocytes Relative: 3 %
Lymphs Abs: 0.5 10*3/uL — ABNORMAL LOW (ref 0.7–4.0)
MCH: 31 pg (ref 26.0–34.0)
MCHC: 32.8 g/dL (ref 30.0–36.0)
MCV: 94.6 fL (ref 80.0–100.0)
Monocytes Absolute: 0.9 10*3/uL (ref 0.1–1.0)
Monocytes Relative: 6 %
Neutro Abs: 15 10*3/uL — ABNORMAL HIGH (ref 1.7–7.7)
Neutrophils Relative %: 91 %
Platelets: 280 10*3/uL (ref 150–400)
RBC: 5.19 MIL/uL — ABNORMAL HIGH (ref 3.87–5.11)
RDW: 12.4 % (ref 11.5–15.5)
WBC: 16.5 10*3/uL — ABNORMAL HIGH (ref 4.0–10.5)
nRBC: 0 % (ref 0.0–0.2)

## 2020-02-23 LAB — COMPREHENSIVE METABOLIC PANEL
ALT: 17 U/L (ref 0–44)
AST: 24 U/L (ref 15–41)
Albumin: 4.4 g/dL (ref 3.5–5.0)
Alkaline Phosphatase: 108 U/L (ref 38–126)
Anion gap: 14 (ref 5–15)
BUN: 15 mg/dL (ref 8–23)
CO2: 26 mmol/L (ref 22–32)
Calcium: 10 mg/dL (ref 8.9–10.3)
Chloride: 96 mmol/L — ABNORMAL LOW (ref 98–111)
Creatinine, Ser: 0.72 mg/dL (ref 0.44–1.00)
GFR calc Af Amer: >60 mL/min (ref >60)
GFR calc non Af Amer: >60 mL/min (ref >60)
Glucose, Bld: 185 mg/dL — ABNORMAL HIGH (ref 70–99)
Potassium: 3.7 mmol/L (ref 3.5–5.1)
Sodium: 136 mmol/L (ref 135–145)
Total Bilirubin: 0.9 mg/dL (ref 0.3–1.2)
Total Protein: 9.9 g/dL — ABNORMAL HIGH (ref 6.5–8.1)

## 2020-02-23 LAB — BRAIN NATRIURETIC PEPTIDE: B Natriuretic Peptide: 231.5 pg/mL — ABNORMAL HIGH (ref 0.0–100.0)

## 2020-02-23 LAB — LACTIC ACID, PLASMA: Lactic Acid, Venous: 2.4 mmol/L (ref 0.5–1.9)

## 2020-02-23 LAB — AMMONIA: Ammonia: 27 umol/L (ref 9–35)

## 2020-02-23 LAB — CK: Total CK: 113 U/L (ref 38–234)

## 2020-02-23 LAB — SARS CORONAVIRUS 2 BY RT PCR (HOSPITAL ORDER, PERFORMED IN ~~LOC~~ HOSPITAL LAB): SARS Coronavirus 2: NEGATIVE

## 2020-02-23 LAB — I-STAT BETA HCG BLOOD, ED (MC, WL, AP ONLY): I-stat hCG, quantitative: <5 m[IU]/mL (ref <5)

## 2020-02-23 LAB — D-DIMER, QUANTITATIVE: D-Dimer, Quant: 0.92 ug/mL-FEU — ABNORMAL HIGH (ref 0.00–0.50)

## 2020-02-23 LAB — LIPASE, BLOOD: Lipase: 18 U/L (ref 11–51)

## 2020-02-23 LAB — TSH: TSH: 0.906 u[IU]/mL (ref 0.350–4.500)

## 2020-02-23 IMAGING — DX DG CHEST 1V PORT
1 series · 1 of 1 positions shown · non-contrast
Comparison: [DATE]

CLINICAL DATA: Emesis

EXAM:
PORTABLE CHEST 1 VIEW

[chest]
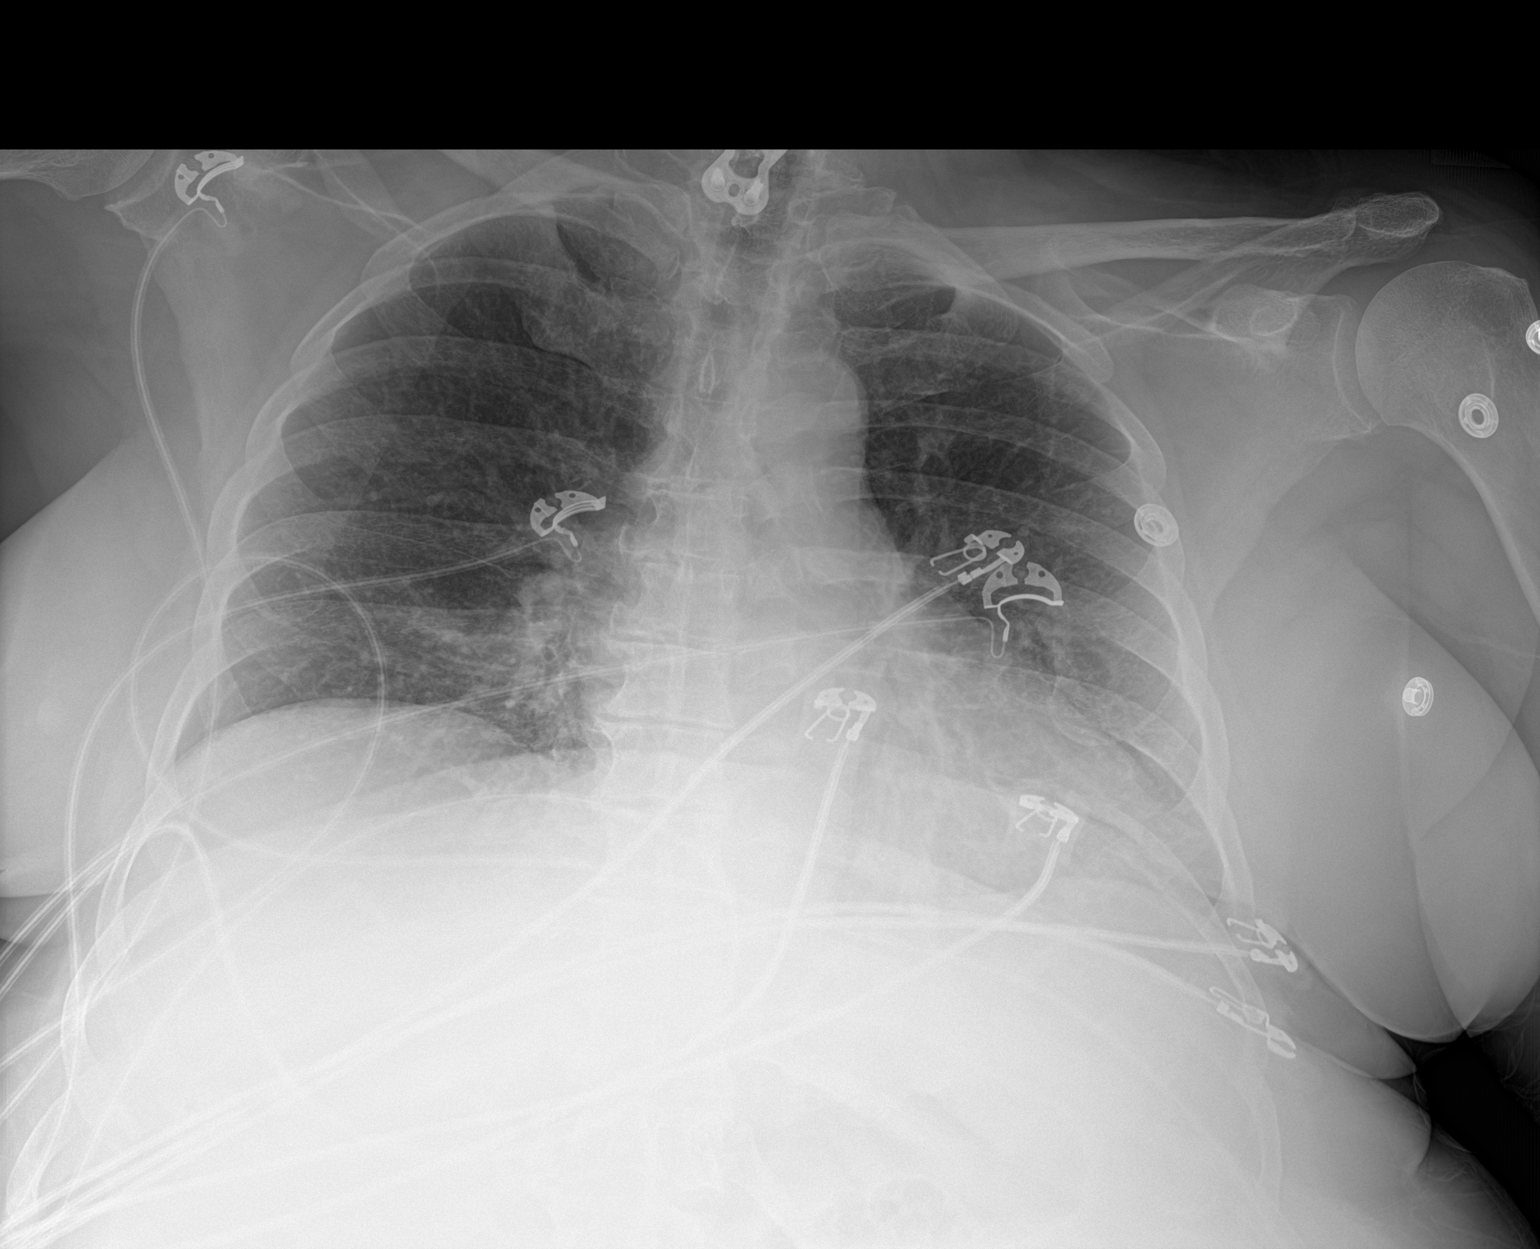

[1 of 1 positions shown; findings below may reference images not displayed]

FINDINGS: Lung volumes are small, but are symmetric. Minimal retrocardiac
atelectasis or infiltrate. No pneumothorax or pleural effusion.
Cardiac size within normal limits. Pulmonary vascularity normal. No
acute bone abnormality.
IMPRESSION: Minimal retrocardiac atelectasis or infiltrate. Pulmonary
hypoinflation.

## 2020-02-23 MED ORDER — SODIUM CHLORIDE 0.9 % IV BOLUS
1000.0000 mL | Freq: Once | INTRAVENOUS | Status: AC
  Administered 2020-02-23: 1000 mL via INTRAVENOUS

## 2020-02-23 MED ORDER — BUPRENORPHINE HCL-NALOXONE HCL 2-0.5 MG SL SUBL
1.0000 | SUBLINGUAL_TABLET | Freq: Four times a day (QID) | SUBLINGUAL | Status: DC
  Administered 2020-02-23 – 2020-02-24 (×4): 1 via SUBLINGUAL
  Filled 2020-02-23 (×4): qty 1

## 2020-02-23 MED ORDER — CLONAZEPAM 0.5 MG PO TABS
0.5000 mg | ORAL_TABLET | Freq: Three times a day (TID) | ORAL | Status: DC
  Administered 2020-02-24 (×2): 0.5 mg via ORAL
  Filled 2020-02-23 (×2): qty 1

## 2020-02-23 MED ORDER — ACETAMINOPHEN 650 MG RE SUPP: 650.0000 mg | Freq: Four times a day (QID) | RECTAL | Status: DC | PRN

## 2020-02-23 MED ORDER — LACTATED RINGERS IV SOLN: INTRAVENOUS | Status: DC

## 2020-02-23 MED ORDER — ENOXAPARIN SODIUM 40 MG/0.4ML ~~LOC~~ SOLN
40.0000 mg | SUBCUTANEOUS | Status: DC
  Administered 2020-02-23: 40 mg via SUBCUTANEOUS
  Filled 2020-02-23: qty 0.4

## 2020-02-23 MED ORDER — LORAZEPAM 1 MG PO TABS
1.0000 mg | ORAL_TABLET | ORAL | Status: DC | PRN
  Administered 2020-02-24: 1 mg via ORAL
  Filled 2020-02-23: qty 1

## 2020-02-23 MED ORDER — PANTOPRAZOLE SODIUM 40 MG PO TBEC
40.0000 mg | DELAYED_RELEASE_TABLET | Freq: Two times a day (BID) | ORAL | Status: DC
  Administered 2020-02-24: 40 mg via ORAL
  Filled 2020-02-23: qty 1

## 2020-02-23 MED ORDER — LORAZEPAM 2 MG/ML IJ SOLN
1.0000 mg | Freq: Once | INTRAMUSCULAR | Status: AC
  Administered 2020-02-23: 1 mg via INTRAVENOUS
  Filled 2020-02-23: qty 1

## 2020-02-23 MED ORDER — THIAMINE HCL 100 MG/ML IJ SOLN
100.0000 mg | Freq: Every day | INTRAMUSCULAR | Status: DC
  Administered 2020-02-23 – 2020-02-24 (×2): 100 mg via INTRAVENOUS
  Filled 2020-02-23 (×2): qty 2

## 2020-02-23 MED ORDER — LORAZEPAM 2 MG/ML IJ SOLN: 1.0000 mg | INTRAMUSCULAR | Status: DC | PRN

## 2020-02-23 MED ORDER — LORAZEPAM 2 MG/ML IJ SOLN
2.0000 mg | Freq: Once | INTRAMUSCULAR | Status: AC
  Administered 2020-02-23: 2 mg via INTRAVENOUS
  Filled 2020-02-23: qty 1

## 2020-02-23 MED ORDER — BUPRENORPHINE HCL-NALOXONE HCL 2-0.5 MG SL SUBL: 1.0000 | SUBLINGUAL_TABLET | Freq: Every day | SUBLINGUAL | Status: DC

## 2020-02-23 MED ORDER — METOCLOPRAMIDE HCL 5 MG/ML IJ SOLN
5.0000 mg | Freq: Once | INTRAMUSCULAR | Status: AC
  Administered 2020-02-23: 5 mg via INTRAVENOUS
  Filled 2020-02-23: qty 2

## 2020-02-23 MED ORDER — SODIUM CHLORIDE 0.9% FLUSH
3.0000 mL | Freq: Two times a day (BID) | INTRAVENOUS | Status: DC
  Administered 2020-02-24 (×2): 3 mL via INTRAVENOUS

## 2020-02-23 MED ORDER — ACETAMINOPHEN 325 MG PO TABS
650.0000 mg | ORAL_TABLET | Freq: Four times a day (QID) | ORAL | Status: DC | PRN
  Administered 2020-02-24: 650 mg via ORAL
  Filled 2020-02-23: qty 2

## 2020-02-23 MED ORDER — AMLODIPINE BESYLATE 5 MG PO TABS
5.0000 mg | ORAL_TABLET | Freq: Every day | ORAL | Status: DC
  Administered 2020-02-23 – 2020-02-24 (×2): 5 mg via ORAL
  Filled 2020-02-23 (×3): qty 1

## 2020-02-23 MED ORDER — ADULT MULTIVITAMIN W/MINERALS CH
1.0000 | ORAL_TABLET | Freq: Every day | ORAL | Status: DC
  Administered 2020-02-23 – 2020-02-24 (×2): 1 via ORAL
  Filled 2020-02-23 (×2): qty 1

## 2020-02-23 NOTE — ED Notes (Signed)
IV attempt unsuccessful x 2

## 2020-02-23 NOTE — ED Provider Notes (Signed)
[facility] EMERGENCY DEPARTMENT Provider Note   CSN: [id number] Arrival date & time: 02/23/20  1357     History Chief Complaint  Patient presents with  . Emesis    Stephanie Carrillo is a 64 y.o. female.  Who presents emergency department with chief complaint of vomiting and leg pain.  Patient was admitted to the hospital approximately 3 weeks ago with rhabdomyolysis, altered mental status deemed to be consistent with encephalopathy after EEG, presumed urinary tract infection.  Patient was also felt to be in benzodiazepine withdrawal due to her vomiting.  She has a past medical history of hypertension, substance abuse and opioid dependent now on buprenorphine, and chronic pain.  She is persistently tachycardic throughout her previous visit.  Patient is currently altered and there is a level 5 caveat.  She does admit to having dark urine and persistent vomiting but denies abdominal pain.  HPI     Past Medical History:  Diagnosis Date  . Anxiety   . Depression   . Hypertension   . Insomnia   . Meningitis   . Seizures (HCC)   . Substance abuse [facility])     Patient Active Problem List   Diagnosis Date Noted  . Benzodiazepine withdrawal (HCC) 02/01/2020  . Hypokalemia   . Tachycardia   . Altered mental state 01/27/2020  . Anxiety and depression 09/23/2018  . Insomnia 09/23/2018  . Chronic pain syndrome 09/23/2018    Past Surgical History:  Procedure Laterality Date  . BACK SURGERY    . CERVICAL DISC SURGERY       OB History   No obstetric history on file.     Family History  Problem Relation Age of Onset  . Multiple sclerosis Mother   . Cancer Father     Social History   Tobacco Use  . Smoking status: Former Games developer  . Smokeless tobacco: Never Used  Vaping Use  . Vaping Use: Never used  Substance Use Topics  . Alcohol use: Not Currently  . Drug use: Not Currently    Comment: opiates -clean 6 months    Home Medications Prior to Admission  medications   Medication Sig Start Date End Date Taking? Authorizing Provider  amLODipine (NORVASC) 5 MG tablet Take 1 tablet (5 mg total) by mouth daily. Patient not taking: Reported on 01/27/2020 09/25/18   [place], Alvira Philips, DO  buprenorphine (SUBUTEX) 2 MG SUBL SL tablet Place 2 mg under the tongue every 4 (four) hours as needed for anxiety. LF on 01-05-20 # 180 DS 30 12 mg dose daily 01/05/20   [provider]  clonazePAM (KLONOPIN) 0.5 MG tablet Take 1 tablet (0.5 mg total) by mouth 3 (three) times daily as needed for anxiety. 01/30/20   Dellia Cloud, MD  diphenhydrAMINE (BENADRYL) 25 MG tablet Take 25 mg by mouth at bedtime as needed for sleep.    [provider]  gabapentin (NEURONTIN) 800 MG tablet Take 800 mg by mouth 3 (three) times daily.    [provider]  ibuprofen (ADVIL,MOTRIN) 100 MG tablet Take 200 mg by mouth 2 (two) times daily as needed for pain.    [provider]  ondansetron (ZOFRAN) 4 MG tablet Take 1 tablet (4 mg total) by mouth every 6 (six) hours as needed for nausea. Patient not taking: Reported on 01/27/2020 09/25/18   [place], Alvira Philips, DO  tiZANidine (ZANAFLEX) 4 MG capsule Take 4 mg by mouth 3 (three) times daily.    [provider]  Allergies    Ciprofloxacin, Penicillins, and Oxycodone  Review of Systems   Review of Systems  Unable to perform ROS: Other    Physical Exam Updated Vital Signs BP (!) 190/100 (BP Location: Right Arm)   Pulse (!) 120   Temp 98.9 F (37.2 C) (Oral)   Resp 16   SpO2 96%   Physical Exam Constitutional:      Appearance: She is ill-appearing and diaphoretic.     Comments: Hands and feet are cool and clammy to touch  HENT:     Head: Normocephalic and atraumatic.     Mouth/Throat:     Mouth: Mucous membranes are dry.  Eyes:     Extraocular Movements: Extraocular movements intact.     Pupils: Pupils are equal, round, and reactive to light.  Cardiovascular:     Rate and Rhythm:  Tachycardia present.     Pulses: Normal pulses.  Pulmonary:     Comments: tachypneic  Abdominal:     Tenderness: There is no abdominal tenderness.     Comments: Lower abdomen is firm and distended  Musculoskeletal:     Cervical back: Normal range of motion.     Right lower leg: No edema.     Left lower leg: No edema.  Skin:    General: Skin is moist.  Neurological:     Mental Status: She is alert. She is confused.     Cranial Nerves: Cranial nerves are intact.     Sensory: Sensation is intact.     Motor: Motor function is intact.     ED Results / Procedures / Treatments   Labs (all labs ordered are listed, but only abnormal results are displayed) Labs Reviewed  SARS CORONAVIRUS 2 BY RT PCR (HOSPITAL ORDER, PERFORMED IN [facility])  CULTURE, BLOOD (ROUTINE X 2)  CULTURE, BLOOD (ROUTINE X 2)  CBC WITH DIFFERENTIAL/PLATELET  COMPREHENSIVE METABOLIC PANEL  LIPASE, BLOOD  URINALYSIS, ROUTINE W REFLEX MICROSCOPIC  LACTIC ACID, PLASMA  CK  RAPID URINE DRUG SCREEN, HOSP PERFORMED  AMMONIA  I-STAT CHEM 8, ED    EKG None  Radiology No results found.  Procedures Procedures (including critical care time)  Medications Ordered in ED Medications  sodium chloride 0.9 % bolus 1,000 mL (has no administration in time range)    ED Course  I have reviewed the triage vital signs and the nursing notes.  Pertinent labs & imaging results that were available during my care of the patient were reviewed by me and considered in my medical decision making (see chart for details).  Clinical Course as of Feb 23 2227  Thu Feb 23, 2020  1515 CK Total: 113 [AH]  1841 Patient continues to take her  prozac     [AH]    Clinical Course User Index [AH] Delos Haring   MDM Rules/Calculators/A&P                          63 year old female here with nausea vomiting.  She has persistent hypertension and tachycardia. The emergent differential diagnosis for vomiting  includes, but is not limited to ACS/MI, Boerhaave's, DKA, Intracranial Hemorrhage, Ischemic bowel, Meningitis, Sepsis, Acute radiation syndrome, Acute gastric dilation, Acetaminophen toxicity, Adrenal insufficiency, Appendicitis, Aspirin toxicity, Bowel obstruction/ileus, Carbon monoxide poisoning, Cholecystitis, CNS tumor. Digoxin toxicity, Electrolyte abnormalities, Elevated ICP, Gastric outlet obstruction, Hyperemesis gravidarum, Pancreatitis, Peritonitis, Ruptured viscus, Testicular torsion/ovarian torsion, Theophyline toxicity, Biliary colic, Cannabinoid hyperemesis syndrome, Chemotherapy, Disulfiram effect, Erythromycin, ETOH,  Gastritis, Gastroenteritis, Gastroparesis, Hepatitis, Ibuprofen, Ipecac toxicity, Labyrinthitis, Migraine, Motion sickness, Narcotic withdrawal, Thyroid, Pregnancy, Peptic ulcer disease, Renal colic, and UTI I ordered interpreted and reviewed labs which shows urine which shows no acute abnormalities does show elevated protein levels no obvious infection. CMP shows mild hypochloremia likely secondary to her vomiting.  Slightly elevated blood glucose, CBC with elevated white blood cell count and hemoglobin.  Likely volume contraction.  Lactic acid elevated however I have low suspicion for sepsis is likely due to dehydration.  Patient's Covid test is negative.  UDS positive for benzodiazepines. Portable 1 view chest shows potential infiltrate however patient denies significant shortness of breath, cough or fever. Differential also includes serotonin syndrome, benzodiazepine withdrawal, hypertensive encephalopathy.  Patient will be admitted to the internal medicine service. Final Clinical Impression(s) / ED Diagnoses Final diagnoses:  Nausea vomiting and diarrhea  Benzodiazepine withdrawal with delirium [facility])    Rx / DC Orders ED Discharge Orders    None       Arthor Captain, PA-C 02/23/20 2238    Mancel Bale, MD 02/24/20 2056

## 2020-02-23 NOTE — ED Provider Notes (Signed)
Medical screening examination/treatment/procedure(s) were conducted as a shared visit with non-physician practitioner(s) and myself.  I personally evaluated the patient during the encounter. Briefly, the patient is a 64 y.o. female with history of hypertension, seizures, polysubstance abuse who presents to the ED with nausea and vomiting.  Symptoms ongoing for the last 3 days.  Recent admission for something similar.  Patient tachycardic upon arrival.  States that she has not been able to tolerate any of her oral medications.  However she has been able to take her buprenorphine as it goes under her tongue.  She complains of diffuse body aches.  Has not been able to take her daily benzodiazepines.  Upon admission it was felt that she had benzo withdrawal last time she was here complicated by rhabdomyolysis.  Suspect a similar process today.  No concern for meningitis or infectious process.  Does not have particular abdominal pain.  Mostly complaining of cramps in her legs.  Lab work already back shows elevated lactic acid.  Mildly elevated white count.  Hemoglobin appears hemoconcentrated likely in the setting of dehydration.  It appears that she has had improvement with Ativan.  Mentation appears to be improving.  Still tachycardic and still mildly encephalopathic.  Will admit for further care.    This chart was dictated using voice recognition software.  Despite best efforts to proofread,  errors can occur which can change the documentation meaning.     EKG Interpretation  Date/Time:  Thursday February 23 2020 14:01:47 EDT Ventricular Rate:  117 PR Interval:    QRS Duration: 81 QT Interval:  351 QTC Calculation: 490 R Axis:   58 Text Interpretation: Sinus tachycardia Borderline prolonged QT interval Confirmed by Virgina Norfolk [id number]) on 02/23/2020 4:30:52 PM          Virgina Norfolk, DO 02/23/20 1658

## 2020-02-23 NOTE — ED Triage Notes (Signed)
Patient from home with [facility] for emesis and leg pain. Patient states she was admitted 3 weeks ago for rhabdomyolysis, reports today's symptoms are the same as then. Patient alert, oriented, and in no apparent distress at this time.  EMS unable to obtain IV access en route. 4 mg zofran given IM prior to arrival.

## 2020-02-23 NOTE — ED Notes (Signed)
Bladder scan result: 115 mL

## 2020-02-23 NOTE — H&P (Signed)
Date: 02/23/2020               Patient Name:  Stephanie Carrillo MRN: [id number]  DOB: 1956-08-01 Age / Sex: 64 y.o., female   PCP: Carmel Sacramento, NP         Medical Service: Internal Medicine Teaching Service         Attending Physician: Dr. Inez Catalina, MD    First Contact: Dr. Tonye Royalty Pager: [phone]  Second Contact: Dr. Jodelle Red Pager: [phone]       After Hours (After 5p/  First Contact Pager: [phone]  weekends / holidays): Second Contact Pager: [phone]   Chief Complaint: Vomiting  History of Present Illness: Stephanie Carrillo is a 72 year old woman with past medical history significant for hypertension, seizures (previously on phenobarbital), opioid dependence (now on buprenorphine), anxiety and depression who presented to [facility] with nausea, vomiting and leg discomfort.  On 06/25, she presented to [facility] with nausea, vomiting, diarrhea, and altered mental status found to have benzodiazepine withdrawal, rhabdomyolysis and concern for urinary tract infection. She was admitted, received IVF, diazepam and antibiotics and was discharged home on 06/28. She was advised to stop taking her home fluoxetine upon discharge.  Since her discharge, she has continued to take her home fluoxetine and states that she has felt great. She has been taking her prescribed klonopin 0.5mg  three times daily and buprenorphine 2mg  every four to six hours. She initially reported the development of nausea, vomiting and bilateral leg discomfort three days prior to presentation. Upon further questioning, she reports these symptoms may have started yesterday or this morning. Due to her nausea and vomiting, she has been unable to take any of her home medications (including her home amlodipine, klonopin and buprenorphine) and has had no PO intake during this time. She states that her vomit has appeared brown and possibly containing blood. Otherwise, she denies diarrhea, abdominal pain, fevers, chills, chest pain,  and shortness of breath.  ED Course: On arrival to ED, she was tachycardic to the 120s, hypertensive to 190/100, afebrile, and saturating well on room air. She was altered from her baseline and confused. Labs: CBC, WBC of 16.5, Hgb 16.5; lactic acid of 2.4. CMP, lipase, CK, ammonia, and TSH were unremarkable. Blood cultures were collected. CXR showed minimal retrocardiac atelectasis vs. infiltrate. She received 4mg  IV ativan, thiamine, reglan, and two 1L boluses of normal saline. She was admitted to our service for further evaluation and management.  Meds:  No current facility-administered medications on file prior to encounter.   Current Outpatient Medications on File Prior to Encounter  Medication Sig Dispense Refill  . amLODipine (NORVASC) 5 MG tablet Take 1 tablet (5 mg total) by mouth daily. (Patient not taking: Reported on 01/27/2020) 30 tablet 0  . buprenorphine (SUBUTEX) 2 MG SUBL SL tablet Place 2 mg under the tongue every 4 (four) hours.     . clonazePAM (KLONOPIN) 0.5 MG tablet Take 1 tablet (0.5 mg total) by mouth 3 (three) times daily as needed for anxiety. 30 tablet 0  . diphenhydrAMINE (BENADRYL) 25 MG tablet Take 25 mg by mouth at bedtime as needed for sleep.    gabapentin (NEURONTIN) 800 MG tablet Take 800 mg by mouth 3 (three) times daily.    01/29/2020 ibuprofen (ADVIL,MOTRIN) 100 MG tablet Take 200 mg by mouth 2 (two) times daily as needed for pain.    Marland Kitchen ondansetron (ZOFRAN) 4 MG tablet Take 1 tablet (4 mg total) by mouth every  6 (six) hours as needed for nausea. (Patient not taking: Reported on 01/27/2020) 20 tablet 0  . tiZANidine (ZANAFLEX) 2 MG tablet Take 2-4 mg by mouth 3 (three) times daily.     Allergies: Allergies as of 02/23/2020 - Review Complete 02/23/2020  Allergen Reaction Noted  . Ciprofloxacin Shortness Of Breath and Other (See Comments) 09/22/2018  . Penicillins Shortness Of Breath and Rash 09/22/2018  . Oxycodone Other (See Comments) 01/27/2020   Past Medical  History:  Diagnosis Date  . Anxiety   . Depression   . Hypertension   . Insomnia   . Meningitis   . Seizures (HCC)   . Substance abuse (HCC)    Family History:  Family History  Problem Relation Age of Onset  . Multiple sclerosis Mother   . Cancer Father    Social History:  -Former smoker -No alcohol use -Off recreational drugs for two years (now taking buprenorphine)  Review of Systems: A complete ROS was negative except as per HPI.  Physical Exam: Blood pressure (!) 163/90, pulse (!) 117, temperature 98.9 F (37.2 C), temperature source Oral, resp. rate 19, SpO2 95 %.  Physical Exam Constitutional:      Appearance: She is obese. She is ill-appearing.  HENT:     Head: Normocephalic and atraumatic.     Mouth/Throat:     Pharynx: Oropharynx is clear. No posterior oropharyngeal erythema.  Eyes:     Extraocular Movements: Extraocular movements intact.     Conjunctiva/sclera: Conjunctivae normal.  Cardiovascular:     Rate and Rhythm: Regular rhythm. Tachycardia present.     Pulses: Normal pulses.     Heart sounds: Normal heart sounds.  Pulmonary:     Effort: Pulmonary effort is normal.     Breath sounds: Normal breath sounds.  Abdominal:     General: Abdomen is flat. Bowel sounds are normal.     Palpations: Abdomen is soft.     Tenderness: There is abdominal tenderness.     Comments: Diffuse generalized abdominal tenderness  Musculoskeletal:        General: Normal range of motion.     Cervical back: Normal range of motion and neck supple. No rigidity.     Right lower leg: No edema.     Left lower leg: No edema.  Skin:    General: Skin is warm and dry.     Findings: Erythema present.     Comments: Generalized flushing  Neurological:     Mental Status: She is alert and oriented to person, place, and time.     Cranial Nerves: No cranial nerve deficit.     Sensory: No sensory deficit.     Deep Tendon Reflexes: Reflexes normal.     Comments: Patient has diffuse  tremulousness of head and bilateral lower extremities. She is oriented to person, place and reason for hospitalization; however, she is inconsistent in her history.    Admission Labs: CBC - pending CMP - pending Mg - pending Phos - pending  D-dimer - 0.92 BNP - 231.5 UA - >300mg  protein, moderate hemoglobin, small leukocytes, rare bacteria Urine drug screen - positive benzodiazepines HbA1c - pending  EKG: Sinus tachycardia; borderline prolonged QT interval  CXR: Minimal retrocardiac atelectasis or infiltrate; Pulmonary hypoinflation.  Assessment & Plan by Problem: Active Problems:   Acute encephalopathy Stephanie Carrillo is a 42 year old woman with past medical history significant for hypertension, seizures (previously on phenobarbital), opioid dependence (now on buprenorphine), anxiety and depression who presented to [facility] with nausea,  vomiting and leg discomfort found to be acutely encephalopathic.  #Acute encephalopathy -Per patient's report, she felt well until the development of nausea and vomiting. The specific onset of these symptoms is unclear. Due to these symptoms, however, she has been unable to take any of her home medications including her home benzodiazepine and buprenorphine during this time. Following cessation of these medications she has become acutely encephalopathic. While in the ED, her acute encephalopathy improved following 4mg  of ativan. The etiology for her acute encephalopathy may be secondary to benzodiazepine and buprenorphine withdrawal. Her presentation is similar to her prior admission three weeks ago which resolved upon administration of benzodiazepines and resumption of her other home medications. Serotonin syndrome was considered at her prior admission in the setting of hyperthermia and hyperreflexia, however she does not currently meet criteria and is not exhibiting hyperreflexia, hyperthermia, clonus during this admission. Otherwise, her acute encephalopathy  may be explained by hypertensive encephalopathy. -CIWA with ativan as needed -Restarted home clonazepam -Restarted home buprenorphine every 6 hours  #Sinus tachycardia Patient has sinus tachycardia since arrival to the emergency department. Her tachycardia did not resolve following administration of fluids so it is unlikely completely secondary to dehydration. Her tachycardia may be secondary to abrupt medication withdrawal. Additionally, we considered a possible underlying pulmonary embolism however she does not endorse any shortness of breath or chest pain and her D-dimer is only mildly elevated to 0.92. Low Wells score. -If develops shortness of breath, consider CTA  #Nausea #Vomiting #Diffuse abdominal tenderness Patient reports that her nausea and vomiting preceeded the development of her acute encephalopathy. Etiology of these symptoms remains unclear, however a viral gastroenteritis remains high on our differential. Not currently having any active nausea or vomiting following reglan in ED. -Continue to monitor  #Reactive leukocytosis WBC of 16.5 and Hgb 16.1. Leukocytosis is likely secondary to dehydration due to absent PO intake for 24 hours as well as acute stress reaction. -CBC  #Proteinuria Patient exhibiting progressive proteinuria (>300mg  protein), however this is unlikely contributing to her current clinical picture. -Continue to monitor  #Hypertension Patient prescribed amlodipine 5mg . Hypertensive while in ED, however this is slowly improving. -Restarted home amlodipine 5mg   #Hyperglycemia Patient's blood glucose of 185 while in the ED despite absent PO intake. No HbA1c on file. No diagnosis of diabetes. -HbA1c  Code status: Full. Patient requested to be made DNR, however given her current acute encephalopathy, we will continue to keep the code status that she has requested previously. We will re-evaluate her code status upon resolution of her acute  encephalopathy. IVF: Received two 1L boluses of NS VTE ppx: Lovenox 40mg  subcutaneous daily Diet: NPO except for sips with meds  Dispo: Admit patient to Inpatient with expected length of stay greater than 2 midnights.  Signed: , MD 02/23/2020, 9:48 PM  Pager: [phone] After 5pm on weekdays and 1pm on weekends: On Call pager: [phone]

## 2020-02-24 ENCOUNTER — Other Ambulatory Visit: Payer: Self-pay

## 2020-02-24 ENCOUNTER — Observation Stay (HOSPITAL_COMMUNITY): Payer: Medicare Other

## 2020-02-24 ENCOUNTER — Encounter (HOSPITAL_COMMUNITY): Payer: Self-pay | Admitting: Internal Medicine

## 2020-02-24 DIAGNOSIS — R918 Other nonspecific abnormal finding of lung field: Secondary | ICD-10-CM | POA: Diagnosis not present

## 2020-02-24 DIAGNOSIS — R112 Nausea with vomiting, unspecified: Secondary | ICD-10-CM | POA: Insufficient documentation

## 2020-02-24 DIAGNOSIS — J9811 Atelectasis: Secondary | ICD-10-CM | POA: Diagnosis not present

## 2020-02-24 DIAGNOSIS — R197 Diarrhea, unspecified: Secondary | ICD-10-CM | POA: Insufficient documentation

## 2020-02-24 DIAGNOSIS — R519 Headache, unspecified: Secondary | ICD-10-CM | POA: Diagnosis not present

## 2020-02-24 LAB — COMPREHENSIVE METABOLIC PANEL
ALT: 14 U/L (ref 0–44)
AST: 18 U/L (ref 15–41)
Albumin: 3.8 g/dL (ref 3.5–5.0)
Alkaline Phosphatase: 89 U/L (ref 38–126)
Anion gap: 12 (ref 5–15)
BUN: 8 mg/dL (ref 8–23)
CO2: 27 mmol/L (ref 22–32)
Calcium: 9.3 mg/dL (ref 8.9–10.3)
Chloride: 99 mmol/L (ref 98–111)
Creatinine, Ser: 0.61 mg/dL (ref 0.44–1.00)
GFR calc Af Amer: >60 mL/min (ref >60)
GFR calc non Af Amer: >60 mL/min (ref >60)
Glucose, Bld: 105 mg/dL — ABNORMAL HIGH (ref 70–99)
Potassium: 3.2 mmol/L — ABNORMAL LOW (ref 3.5–5.1)
Sodium: 138 mmol/L (ref 135–145)
Total Bilirubin: 0.7 mg/dL (ref 0.3–1.2)
Total Protein: 8.3 g/dL — ABNORMAL HIGH (ref 6.5–8.1)

## 2020-02-24 LAB — CBC
HCT: 46.1 % — ABNORMAL HIGH (ref 36.0–46.0)
Hemoglobin: 15.2 g/dL — ABNORMAL HIGH (ref 12.0–15.0)
MCH: 32.3 pg (ref 26.0–34.0)
MCHC: 33 g/dL (ref 30.0–36.0)
MCV: 98.1 fL (ref 80.0–100.0)
Platelets: 274 10*3/uL (ref 150–400)
RBC: 4.7 MIL/uL (ref 3.87–5.11)
RDW: 12.8 % (ref 11.5–15.5)
WBC: 15.2 10*3/uL — ABNORMAL HIGH (ref 4.0–10.5)
nRBC: 0 % (ref 0.0–0.2)

## 2020-02-24 LAB — MAGNESIUM: Magnesium: 1.6 mg/dL — ABNORMAL LOW (ref 1.7–2.4)

## 2020-02-24 LAB — HEMOGLOBIN A1C
Hgb A1c MFr Bld: 4.9 % (ref 4.8–5.6)
Mean Plasma Glucose: 93.93 mg/dL

## 2020-02-24 LAB — PHOSPHORUS: Phosphorus: 2.5 mg/dL (ref 2.5–4.6)

## 2020-02-24 IMAGING — CT CT HEAD W/O CM
4 series · 17 of 47 positions shown, 19 images · non-contrast
Comparison: [DATE], [DATE]

CLINICAL DATA: Headache, nausea and vomiting, history of substance
abuse

EXAM:
CT HEAD WITHOUT CONTRAST
TECHNIQUE: Contiguous axial images were obtained from the base of the skull
through the vertex without intravenous contrast.

[Series 3: head without · axial · non-contrast · 0.46mm/px · z∈[-154,-29]mm · 7 of 35 slices shown, 9 images]
[im 5/35  brain]
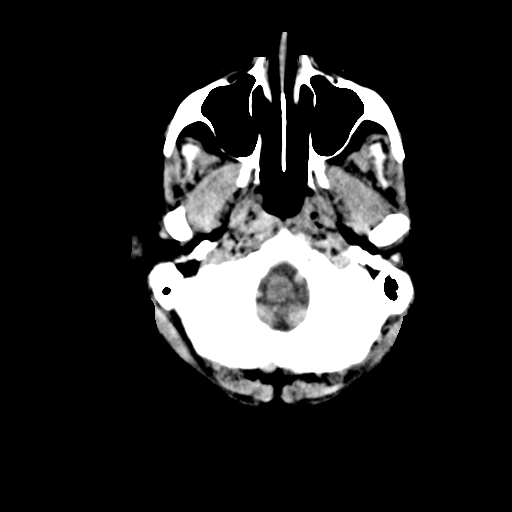
[im 5/35  bone]
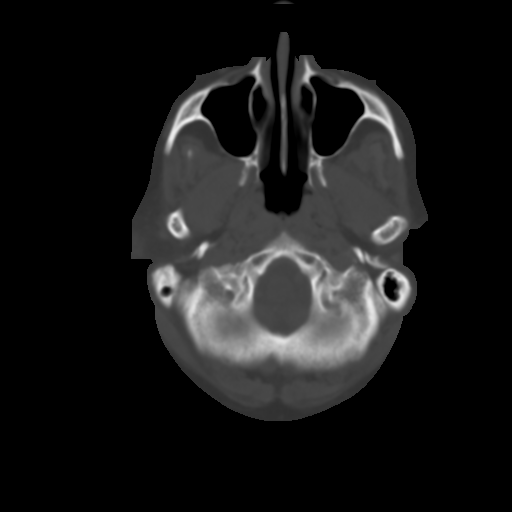
[im 9/35  brain]
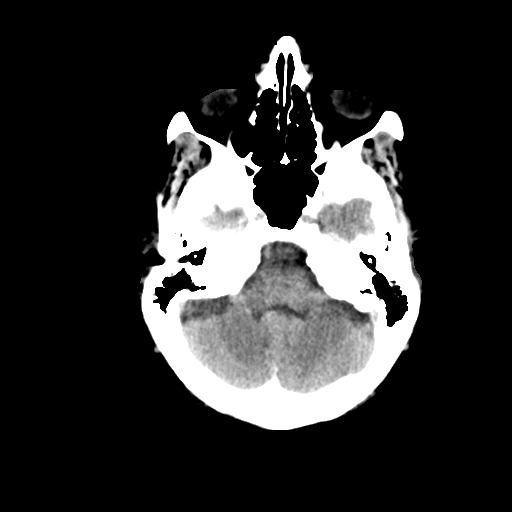
[im 13/35  brain]
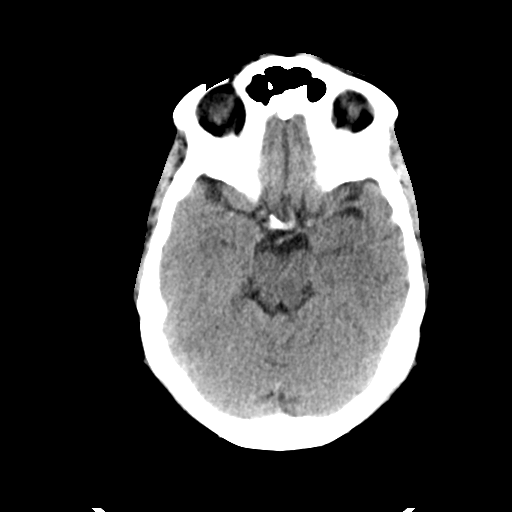
[im 18/35  brain]
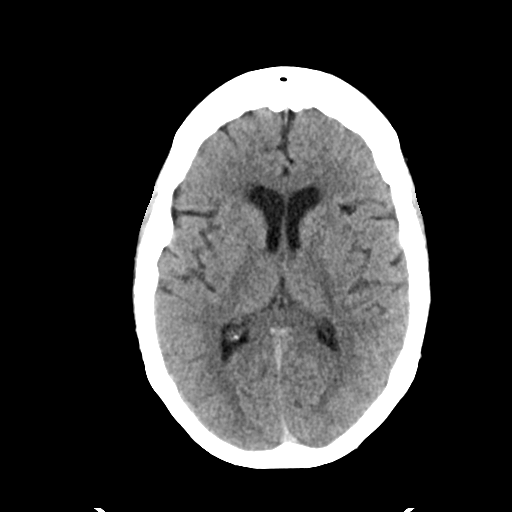
[im 22/35  brain]
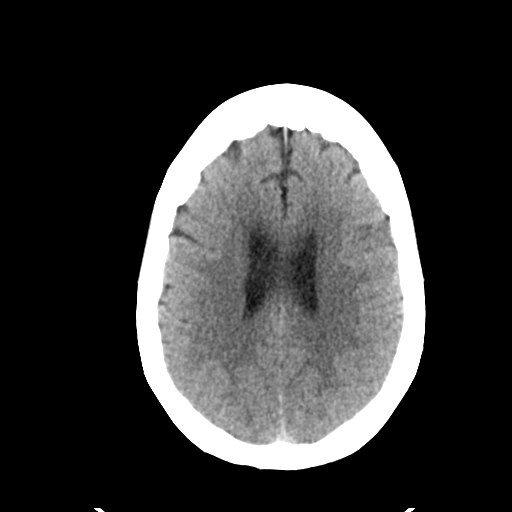
[im 22/35  bone]
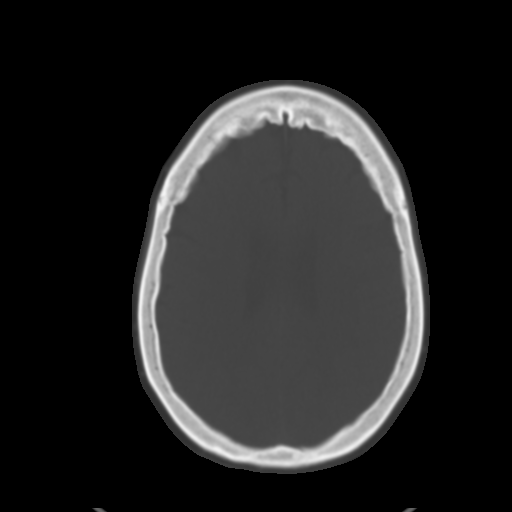
[im 26/35  brain]
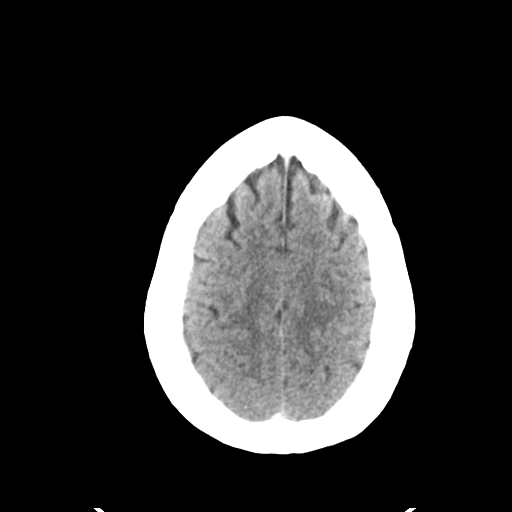
[im 30/35  brain]
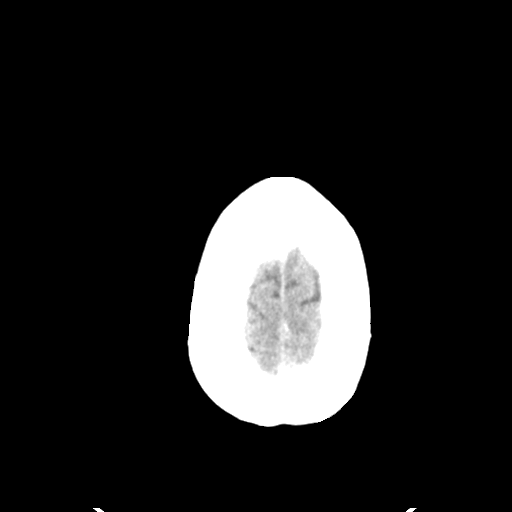

[Series 4: head bone · axial · 0.46mm/px · z∈[-158,-98]mm · 4 of 86 slices shown]
[im 9/86  bone]
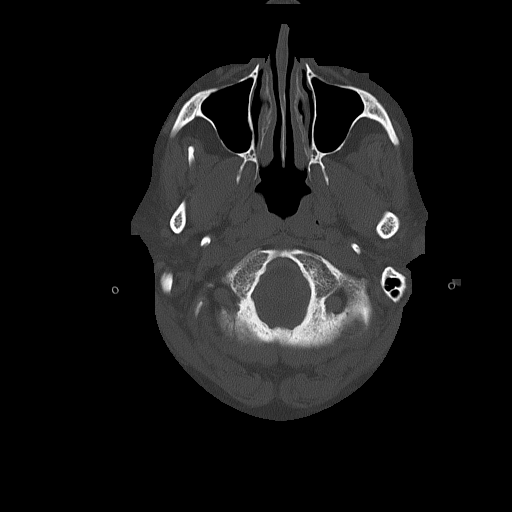
[im 18/86  bone]
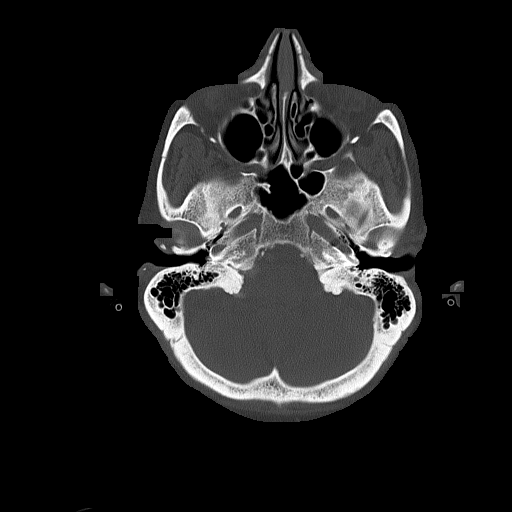
[im 26/86  bone]
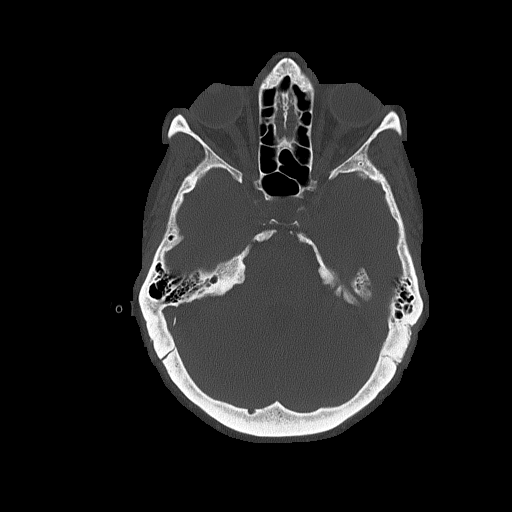
[im 39/86  bone]
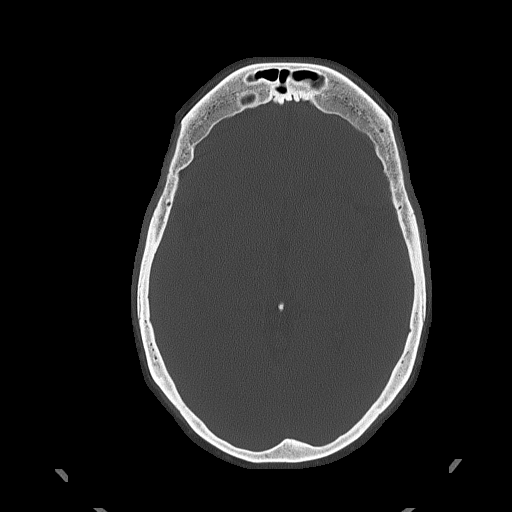

[Series 5: head without cor · coronal · non-contrast · 0.38mm/px · 3 of 67 slices shown]
[im 23/67  brain]
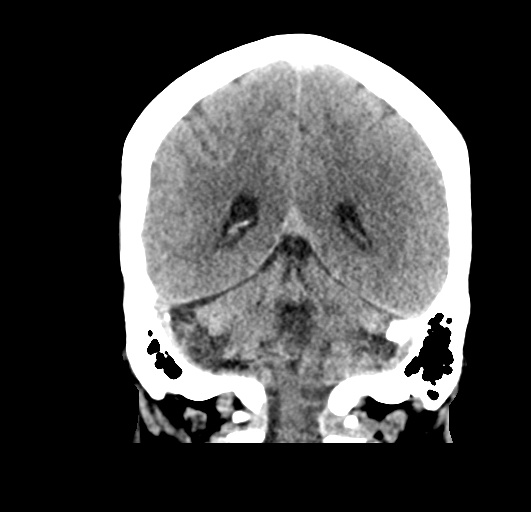
[im 30/67  brain]
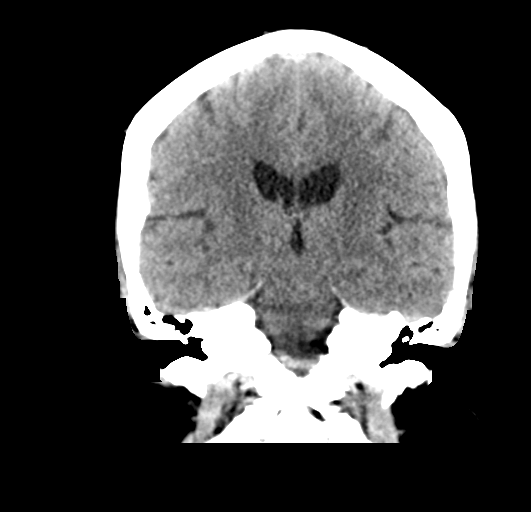
[im 37/67  brain]
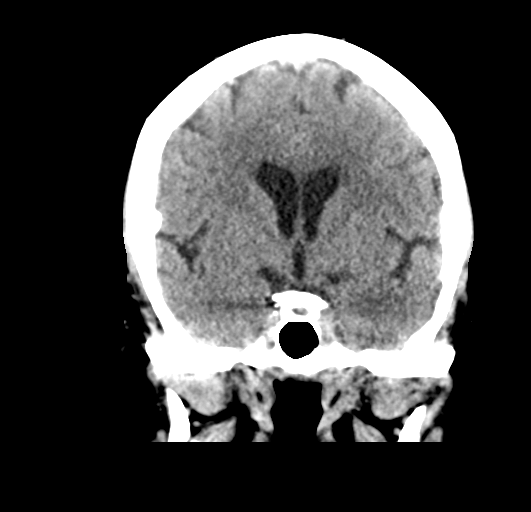

[Series 6: head without sag · sagittal · non-contrast · 0.34mm/px · 3 of 53 slices shown]
[im 18/53  brain]
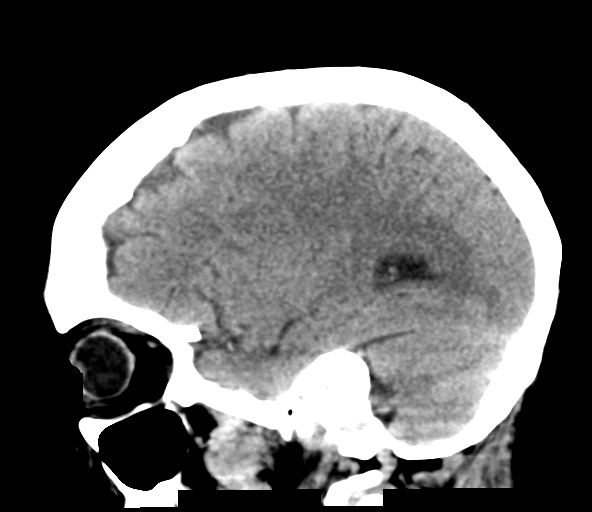
[im 27/53  brain]
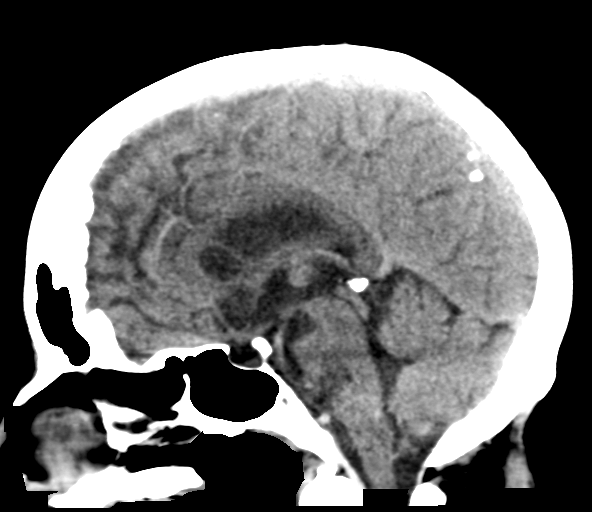
[im 35/53  brain]
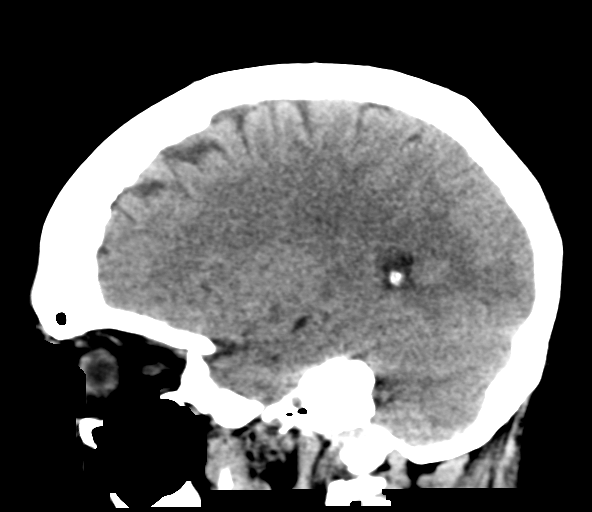

[17 of 47 positions shown; findings below may reference images not displayed]

FINDINGS: Brain: No acute infarct or hemorrhage. Lateral ventricles and
midline structures are unremarkable. No acute extra-axial fluid
collections. No mass effect.

Vascular: No hyperdense vessel or unexpected calcification.

Skull: Normal. Negative for fracture or focal lesion.

Sinuses/Orbits: No acute finding.

Other: None.
IMPRESSION: 1. Stable head CT, no acute process.

## 2020-02-24 IMAGING — CT CT ANGIO CHEST
2 of 6 series · 19 of 36 positions shown · IV contrast (omnipaque)
Comparison: None.

CLINICAL DATA: Short of breath for several days, hypertension,
confusion

EXAM:
CT ANGIOGRAPHY CHEST WITH CONTRAST
TECHNIQUE: Multidetector CT imaging of the chest was performed using the
standard protocol during bolus administration of intravenous
contrast. Multiplanar CT image reconstructions and MIPs were
obtained to evaluate the vascular anatomy.
CONTRAST:  100mL OMNIPAQUE IOHEXOL 350 MG/ML SOLN

[Series 7: pe thins · axial · 0.68mm/px · z∈[-523,-299]mm · 18 of 356 slices shown]
[im 18/356  lung]
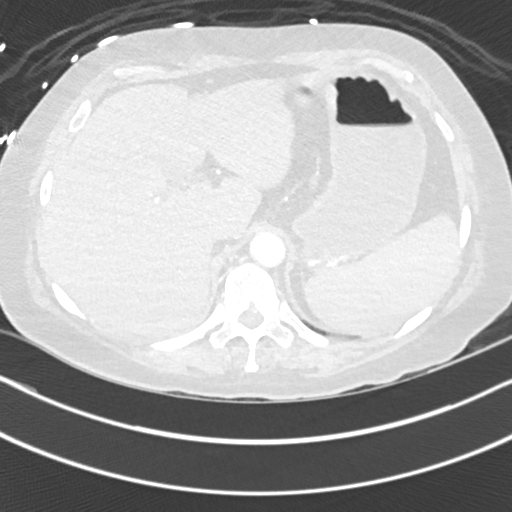
[im 36/356  mediastinal]
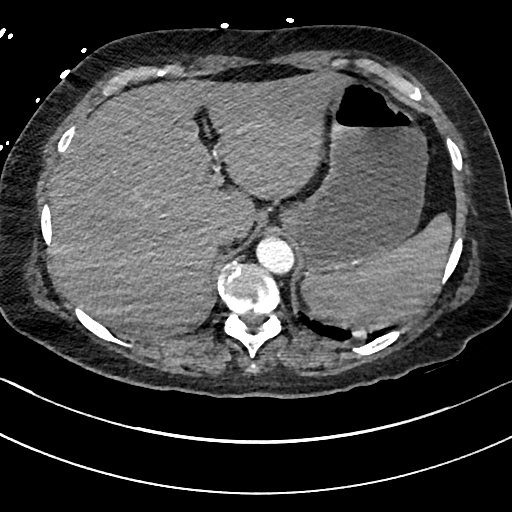
[im 54/356  lung]
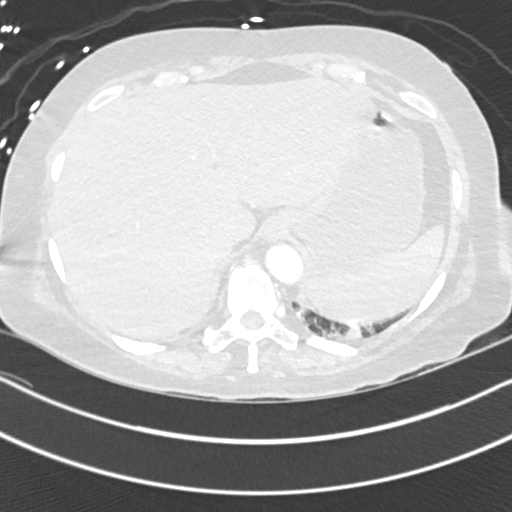
[im 72/356  mediastinal]
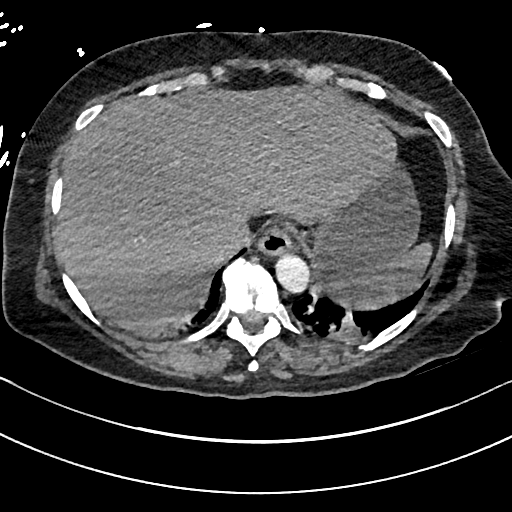
[im 89/356  lung]
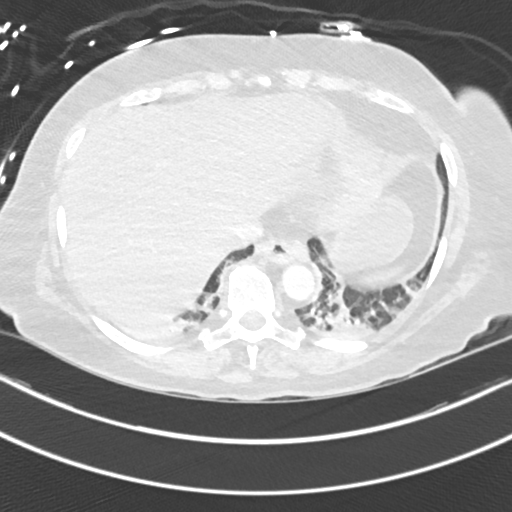
[im 107/356  mediastinal]
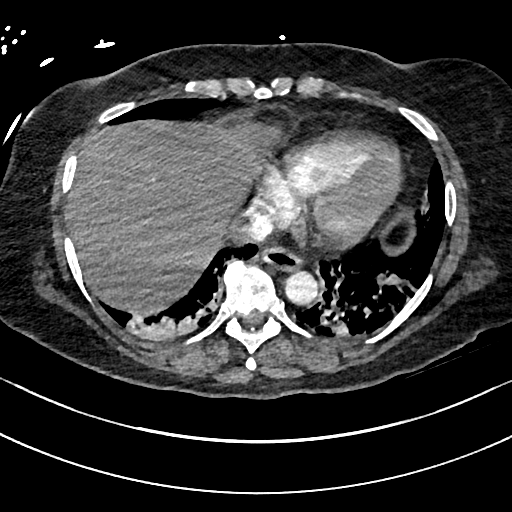
[im 125/356  lung]
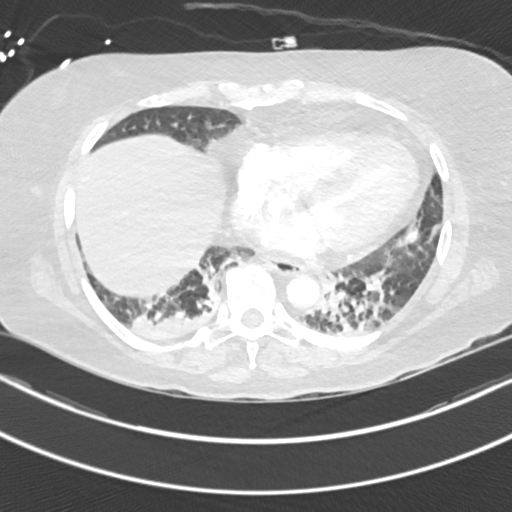
[im 143/356  mediastinal]
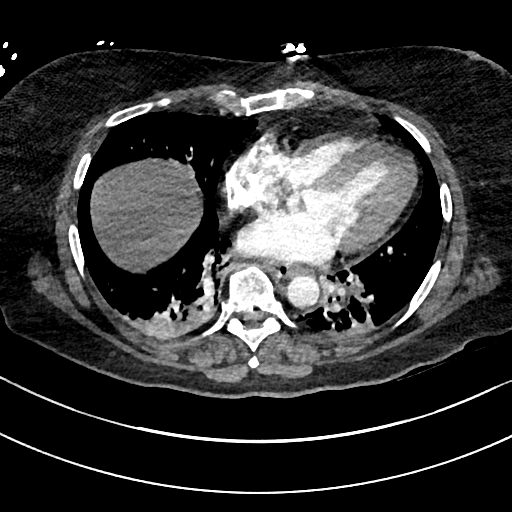
[im 160/356  lung]
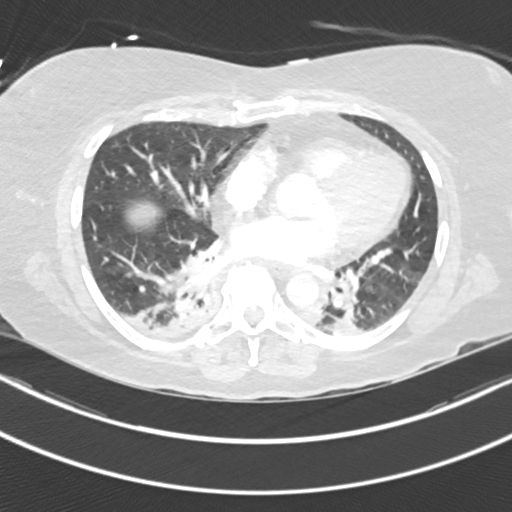
[im 196/356  mediastinal]
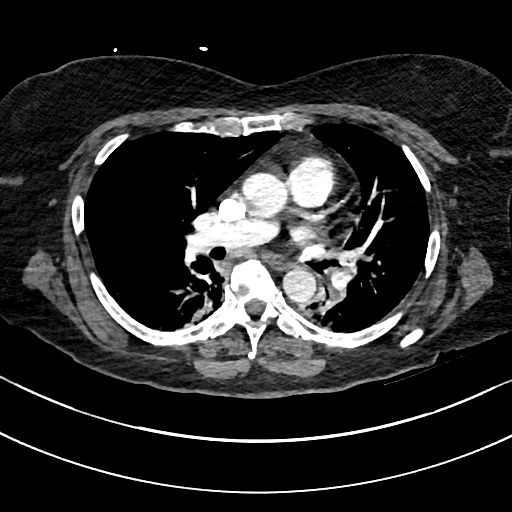
[im 214/356  lung]
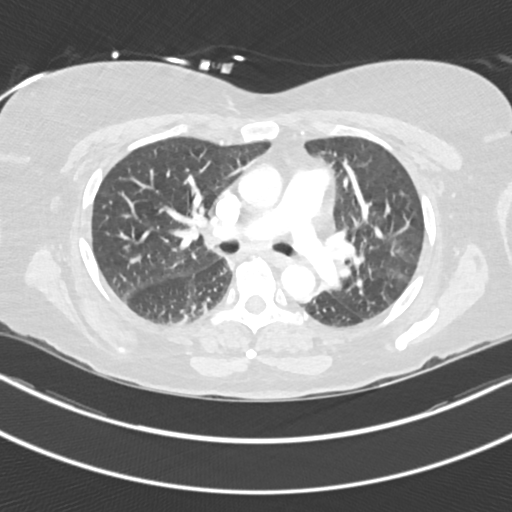
[im 231/356  mediastinal]
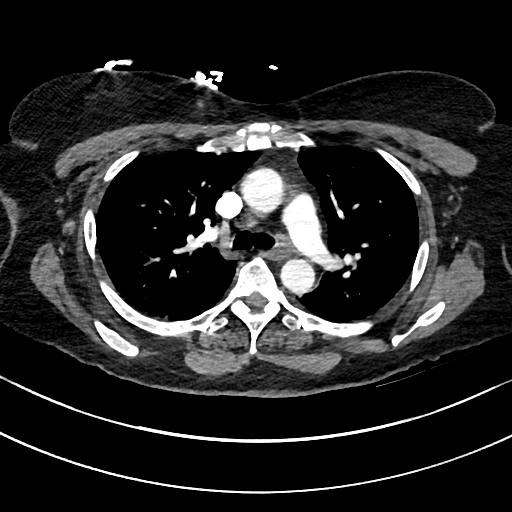
[im 249/356  lung]
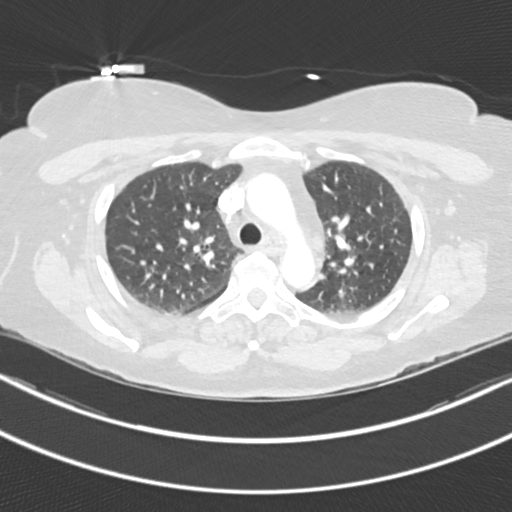
[im 267/356  mediastinal]
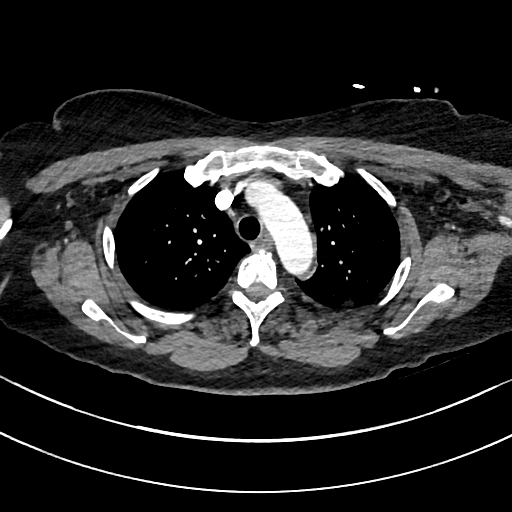
[im 285/356  lung]
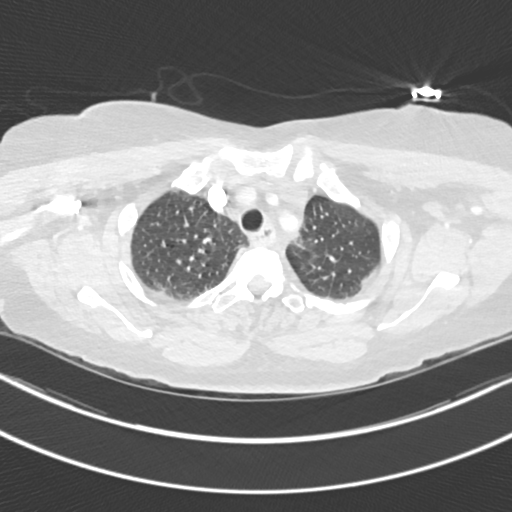
[im 302/356  mediastinal]
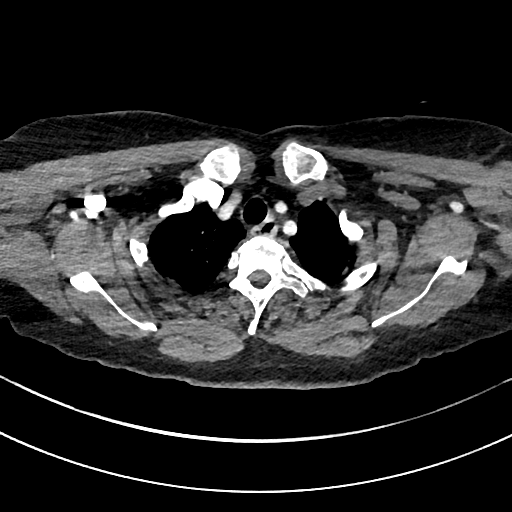
[im 320/356  lung]
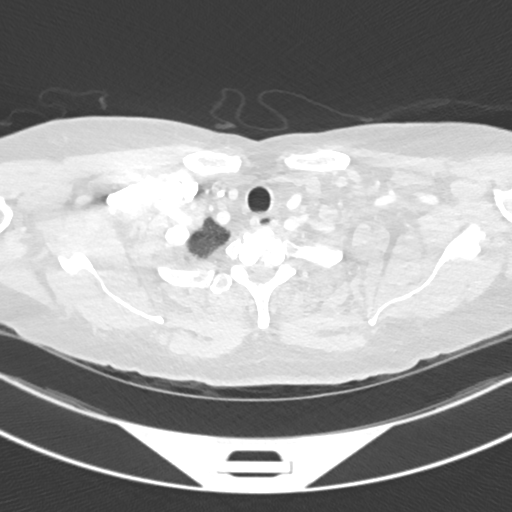
[im 338/356  mediastinal]
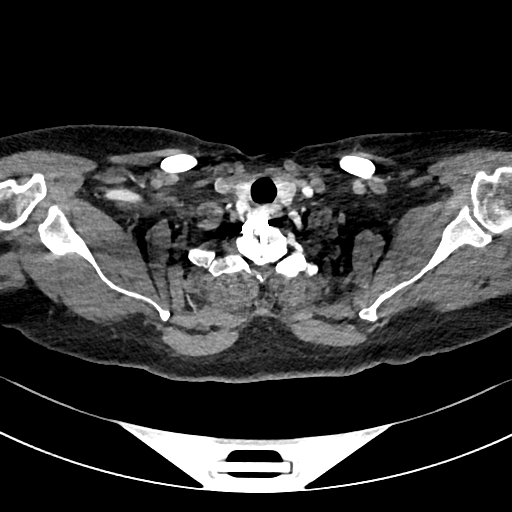

[Series 8: pe 2mm cor · coronal · 0.59mm/px · 1 of 126 slices shown]
[im 63/126  mediastinal]
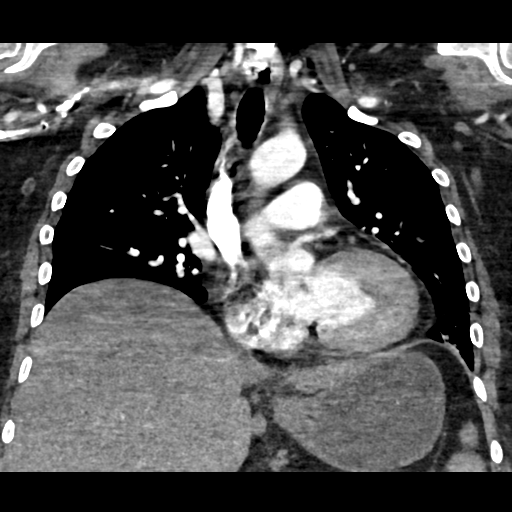

[19 of 36 positions shown; findings below may reference images not displayed]

FINDINGS: Cardiovascular: This is a technically adequate evaluation of the
pulmonary vasculature. No filling defects or pulmonary emboli.

The heart is unremarkable without pericardial effusion. No evidence
of thoracic aortic aneurysm or dissection.

Mediastinum/Nodes: There is a 12 mm short axis lymph node within the
AP window, nonspecific. No other pathologic adenopathy. The thyroid,
trachea, and esophagus are grossly normal.

Lungs/Pleura: Dependent atelectasis is seen within the lower lobes.
No acute airspace disease, effusion, or pneumothorax. The central
airways are patent.

Upper Abdomen: No acute abnormality.

Musculoskeletal: No acute or destructive bony lesions. Reconstructed
images demonstrate no additional findings.

Review of the MIP images confirms the above findings.
IMPRESSION: 1. No evidence of pulmonary embolus.
2. No acute airspace disease. Hypoventilatory changes at the lung
bases.
3. Nonspecific 12 mm short axis lymph node within the AP window.

## 2020-02-24 MED ORDER — CLONAZEPAM 0.5 MG PO TABS: 0.5000 mg | ORAL_TABLET | Freq: Three times a day (TID) | ORAL | 0 refills | Status: DC | PRN

## 2020-02-24 MED ORDER — IOHEXOL 350 MG/ML SOLN
100.0000 mL | Freq: Once | INTRAVENOUS | Status: AC | PRN
  Administered 2020-02-24: 100 mL via INTRAVENOUS

## 2020-02-24 MED ORDER — MAGNESIUM SULFATE 2 GM/50ML IV SOLN
2.0000 g | Freq: Once | INTRAVENOUS | Status: AC
  Administered 2020-02-24: 2 g via INTRAVENOUS
  Filled 2020-02-24: qty 50

## 2020-02-24 NOTE — ED Notes (Signed)
Pt transported to CT ?

## 2020-02-24 NOTE — ED Notes (Signed)
Pt resting comfortably in the bed. Pt's oxygen saturation was 88% on RA. This RN placed pt on 2L [place]. Pt now sating 96%.

## 2020-02-24 NOTE — Discharge Summary (Signed)
Name: Stephanie Carrillo MRN: [id number] DOB: October 15, 1955 64 y.o. PCP: Carmel Sacramento, NP  Date of Admission: 02/23/2020  1:57 PM Date of Discharge:   Attending Physician: Inez Catalina, MD  Discharge Diagnosis: 1. Acute encephalopathy 2/2 benzodiazepine withdrawal  Discharge Medications: Amlodipine 5mg  QD Buprenorphine 2mg  q4hr Diphenhydramine 25mg  QHS PRN Gabapentin 800mg  TID Ibuprofen 100mg  BID PRN Tizandine 2mg  2-4mg  TID Clonazepam 0.5mg  TID PRN Ondansteron 4mg  q6hr PRN  Disposition and follow-up:   Stephanie Carrillo was discharged from [facility] in Stable condition.  At the hospital follow up visit please address:  1.  Follow-up with primary care physician as needed.  2.  Labs / imaging needed at time of follow-up: n/a  3.  Pending labs/ test needing follow-up: n/a  Follow-up Appointments:  n/a  Hospital Course by problem list: 1. Acute encephalopathy 2/2 benzo withdrawal: Patient reports nausea and vomiting, to which she was unable to take her benzodiazepine and buprenorphine. After cessation of her medications, she became acutely encephalopathic. In the ED, patient became more oriented s/p Ativan. Patient continued improvement of symptoms upon re-starting her Klonopin. She has had multiple hospitalizations for similar complaints in the past. In the AM, she reports she ran out of her Klonopin. IMTS in discussion with her primary care physician, as there is concern patient is not taking medication as prescribed. Encouraged patient to only take Klonopin TID as needed. Patient was HDS and oriented and ready for d/c.  2. Nausea/Vomiting: Patient reports nausea and vomiting at home. In ED, she had one episode of emesis. Afebrile throughout admission, patient reports improvement of symptoms in AM. She was able to take po and is ready for d/c.  3. Hypertension: Initially on presentation patient was hypertensive. Re-started her home medication (amlodipine 5mg ) with  improvement of BP. Patient to continue amlodipine upon discharge.  4. Hyperglycemia: Glucose of 185 on admission, no history of diabetes. A1c 4.9. Patient to follow-up with PCP.  Discharge Vitals:   BP (!) 172/83   Pulse (!) 120   Temp 98.9 F (37.2 C) (Oral)   Resp 18   SpO2 92%   Pertinent Labs, Studies, and Procedures:  CMP Latest Ref Rng & Units 02/24/2020 02/23/2020 01/30/2020  Glucose 70 - 99 mg/dL ) ) )  BUN 8 - 23 mg/dL 8 15 5(L)  Creatinine [facility] - 1.00 mg/dL 02/26/2020 02/25/2020  Sodium 135 - 145 mmol/L 138 136 139  Potassium 3.5 - 5.1 mmol/L 3.2(L) 3.7 2.9(L)  Chloride 98 - 111 mmol/L 99 96(L) 97(L)  CO2 22 - 32 mmol/L 27 26 29   Calcium 8.9 - 10.3 mg/dL 9.3 02/01/2020 9.0  Total Protein 6.5 - 8.1 g/dL 8.3(H) 9.9(H) 8.0  Total Bilirubin 0.3 - 1.2 mg/dL 0.7 0.9 0.9  Alkaline Phos 38 - 126 U/L 89 108 75  AST 15 - 41 U/L 18 24 64(H)  ALT 0 - 44 U/L 14 17 28    CBC Latest Ref Rng & Units 02/24/2020 02/23/2020 01/30/2020  WBC 4.0 - 10.5 K/uL 15.2(H) 16.5(H) 10.6(H)  Hemoglobin 12.0 - 15.0 g/dL 15.2(H) 16.1(H) 14.2  Hematocrit 36 - 46 % 46.1(H) 49.1(H) 43.3  Platelets 150 - 400 K/uL 274 280 326   CTA: IMPRESSION: 1. No evidence of pulmonary embolus. 2. No acute airspace disease. Hypoventilatory changes at the lung bases. 3. Nonspecific 12 mm short axis lymph node within the AP window.  CT HEAD: IMPRESSION: 1. Stable head CT, no acute process.  CXR: FINDINGS: Lung volumes are small, but  are symmetric. Minimal retrocardiac atelectasis or infiltrate. No pneumothorax or pleural effusion. Cardiac size within normal limits. Pulmonary vascularity normal. No acute bone abnormality.  IMPRESSION: Minimal retrocardiac atelectasis or infiltrate. Pulmonary hypoinflation.  Discharge Instructions:  Take home Klonopin as prescribed. Follow-up with primary care physician at your next appointment.  Signed: Evlyn Kanner, MD 02/24/2020, 2:11 PM   Pager: [phone]

## 2020-02-24 NOTE — Progress Notes (Signed)
Subjective:  Patient is seen at bedside this AM. She complains of headache. She states that she had uncontrolled vomiting, which brought her to the ED. She did have 1 episode of emesis in the ED. She denied eating something different. She missed her Burenorphrine bc of the vomiting, denied missing any doses before that. She states she ran out of her meds toward the end of the month. Last BM was prior to admission.  Objective:  Vital signs in last 24 hours: Vitals:   02/24/20 0900 02/24/20 1030 02/24/20 1100 02/24/20 1215  BP: (!) 150/93 (!) 150/93 (!) 174/94 (!) 172/83  Pulse: (!) 117 (!) 118 (!) 122 (!) 120  Resp:  16  18  Temp:      TempSrc:      SpO2:  96%  92%   Physical Exam Constitutional:      General: She is awake. She is not in acute distress.    Appearance: Normal appearance.  HENT:     Head: Normocephalic and atraumatic.  Cardiovascular:     Rate and Rhythm: Normal rate and regular rhythm.     Pulses: Normal pulses.     Heart sounds: Normal heart sounds.  Pulmonary:     Effort: Pulmonary effort is normal.     Breath sounds: Normal breath sounds.  Abdominal:     General: Bowel sounds are normal. There is no distension.     Palpations: Abdomen is soft.     Tenderness: There is no abdominal tenderness.  Musculoskeletal:     Right lower leg: No edema.     Left lower leg: No edema.  Skin:    General: Skin is warm and dry.  Neurological:     Mental Status: She is alert.  Psychiatric:        Behavior: Behavior is cooperative.    Assessment/Plan:  Active Problems:   Acute encephalopathy  Stephanie Carrillo is 64yo female with HTN, hx of seizures, opioid dependence who presents to ED w/ nausea and vomiting, found to be acutely encephalopathic likely due to benzodiazepine withdrawal.  #Acute Encephalopathy #Sinus tachycardia Patient arrived in ED yesterday. She is unclear on timeline of symptoms. Reports upon development of nausea and vomiting, unable to take home  benzodiazepine and buprenorphine. Following cessation of medications patient became encephalopathic. Patient in sinus tachycardia on arrival, no resolution w/ IVF. Denied SOB, CP.  Low Wells score, low suspicion of PE. Received Ativan 4mg  in ED which improved her mental status. Patient was admitted under similar circumstances in June. Started her home Klonopin last night. This AM, patient not encephalopathic. Plan to d/c on Klonopin and education on medication compliance.  -CIWA w/ Ativan -Continue Klonopin, Buprenorphine  #Nausea, Vomiting No clear etiology of n/v. Afebrile on arrival. Patient reports this AM she vomited once in ED, but was not nauseous now s/p Reglan. Patient eating some throughout the day, feeling better this AM. Possible viral gastroenteritis v benzo withdrawal. Patient stable for d/c.   #Reactive leukocytosis WBC 16.5, Hgb 16.1. R/p 15.2 this AM. Afebrile since arrival, no source of infection except possible gastroenteritis.  #Proteinuria On arrival patient w/ significant proteinuria. Patient also had proteinuria during last admission in June. Patient to follow-up outpatient for further work-up.  #Hypertension Upon arrival, patient hypertensive w/ SBP 200's. Re-started home amlodipine 5mg  w/ improvement of BP. Encouraging patient to continue home medication and f/u with PCP.   #Hyperglycemia Initial glucose 185 in ED despite absent PO intake. HgbA1c 4.9 today. Patient to f/u  with PCP.  Diet - liquids IVF - s/p 2L NS VTE PPX - Lovenox 40mg  subQ Code Status: Full Prior to Admission Living Arrangement: home Anticipated Discharge Location: home Barriers to Discharge: none Dispo: Anticipated discharge in approximately 0 day(s).   , MD 02/24/2020, 2:03 PM Pager: [phone] After 5pm on weekdays and 1pm on weekends: On Call pager [phone]

## 2020-02-24 NOTE — Hospital Course (Signed)
Patient is seen at bedside today. She complains of headache.  She states that she had uncontrolled vomiting, which brought her to the ED. Last time she threw up was 3? She did have 1 episode of emesis in the ED. She denied eating something different. She missed her Burenorphrine bc of the vomiting. She denied missing any Bure doses before that. She states she ran out of her meds toward the end of the month. She states that her last BM was right before ED admission

## 2020-02-24 NOTE — ED Notes (Signed)
Tele  Breakfast Ordered 

## 2020-02-28 LAB — CULTURE, BLOOD (ROUTINE X 2)
Culture: NO GROWTH
Culture: NO GROWTH
Special Requests: ADEQUATE
Special Requests: ADEQUATE

## 2020-03-05 DIAGNOSIS — R350 Frequency of micturition: Secondary | ICD-10-CM | POA: Diagnosis not present

## 2020-03-05 DIAGNOSIS — Z79899 Other long term (current) drug therapy: Secondary | ICD-10-CM | POA: Diagnosis not present

## 2020-03-09 DIAGNOSIS — I10 Essential (primary) hypertension: Secondary | ICD-10-CM | POA: Diagnosis not present

## 2020-03-09 DIAGNOSIS — F329 Major depressive disorder, single episode, unspecified: Secondary | ICD-10-CM | POA: Diagnosis not present

## 2020-03-09 DIAGNOSIS — F419 Anxiety disorder, unspecified: Secondary | ICD-10-CM | POA: Diagnosis not present

## 2020-03-09 DIAGNOSIS — Z789 Other specified health status: Secondary | ICD-10-CM | POA: Diagnosis not present

## 2020-03-31 DIAGNOSIS — R600 Localized edema: Secondary | ICD-10-CM | POA: Diagnosis not present

## 2020-03-31 DIAGNOSIS — R110 Nausea: Secondary | ICD-10-CM | POA: Diagnosis not present

## 2020-03-31 DIAGNOSIS — G894 Chronic pain syndrome: Secondary | ICD-10-CM | POA: Diagnosis not present

## 2020-03-31 DIAGNOSIS — M5136 Other intervertebral disc degeneration, lumbar region: Secondary | ICD-10-CM | POA: Diagnosis not present

## 2020-05-05 DIAGNOSIS — R0602 Shortness of breath: Secondary | ICD-10-CM | POA: Diagnosis not present

## 2020-05-05 DIAGNOSIS — G894 Chronic pain syndrome: Secondary | ICD-10-CM | POA: Diagnosis not present

## 2020-05-05 DIAGNOSIS — M5136 Other intervertebral disc degeneration, lumbar region: Secondary | ICD-10-CM | POA: Diagnosis not present

## 2020-05-05 DIAGNOSIS — N390 Urinary tract infection, site not specified: Secondary | ICD-10-CM | POA: Diagnosis not present

## 2020-05-07 DIAGNOSIS — I10 Essential (primary) hypertension: Secondary | ICD-10-CM | POA: Diagnosis not present

## 2020-05-07 DIAGNOSIS — Z789 Other specified health status: Secondary | ICD-10-CM | POA: Diagnosis not present

## 2020-05-07 DIAGNOSIS — E7800 Pure hypercholesterolemia, unspecified: Secondary | ICD-10-CM | POA: Diagnosis not present

## 2020-05-09 ENCOUNTER — Ambulatory Visit: Payer: Medicare Other | Admitting: Emergency Medicine

## 2020-05-22 DIAGNOSIS — R0602 Shortness of breath: Secondary | ICD-10-CM | POA: Diagnosis not present

## 2020-05-23 DIAGNOSIS — R0602 Shortness of breath: Secondary | ICD-10-CM | POA: Diagnosis not present

## 2020-06-11 DIAGNOSIS — F341 Dysthymic disorder: Secondary | ICD-10-CM | POA: Diagnosis not present

## 2020-06-11 DIAGNOSIS — Z789 Other specified health status: Secondary | ICD-10-CM | POA: Diagnosis not present

## 2020-06-11 DIAGNOSIS — I10 Essential (primary) hypertension: Secondary | ICD-10-CM | POA: Diagnosis not present

## 2020-06-12 DIAGNOSIS — M5136 Other intervertebral disc degeneration, lumbar region: Secondary | ICD-10-CM | POA: Diagnosis not present

## 2020-06-12 DIAGNOSIS — G894 Chronic pain syndrome: Secondary | ICD-10-CM | POA: Diagnosis not present

## 2020-06-12 DIAGNOSIS — Z79899 Other long term (current) drug therapy: Secondary | ICD-10-CM | POA: Diagnosis not present

## 2020-07-16 DIAGNOSIS — N3020 Other chronic cystitis without hematuria: Secondary | ICD-10-CM | POA: Diagnosis not present

## 2020-07-16 DIAGNOSIS — R338 Other retention of urine: Secondary | ICD-10-CM | POA: Diagnosis not present

## 2020-07-16 DIAGNOSIS — N952 Postmenopausal atrophic vaginitis: Secondary | ICD-10-CM | POA: Diagnosis not present

## 2020-07-16 DIAGNOSIS — R8271 Bacteriuria: Secondary | ICD-10-CM | POA: Diagnosis not present

## 2020-07-17 DIAGNOSIS — G894 Chronic pain syndrome: Secondary | ICD-10-CM | POA: Diagnosis not present

## 2020-07-17 DIAGNOSIS — Z9189 Other specified personal risk factors, not elsewhere classified: Secondary | ICD-10-CM | POA: Diagnosis not present

## 2020-07-17 DIAGNOSIS — M5136 Other intervertebral disc degeneration, lumbar region: Secondary | ICD-10-CM | POA: Diagnosis not present

## 2020-07-17 DIAGNOSIS — Z79899 Other long term (current) drug therapy: Secondary | ICD-10-CM | POA: Diagnosis not present

## 2020-08-02 DIAGNOSIS — I10 Essential (primary) hypertension: Secondary | ICD-10-CM | POA: Diagnosis not present

## 2020-08-02 DIAGNOSIS — M1712 Unilateral primary osteoarthritis, left knee: Secondary | ICD-10-CM | POA: Diagnosis not present

## 2020-08-02 DIAGNOSIS — Z789 Other specified health status: Secondary | ICD-10-CM | POA: Diagnosis not present

## 2020-08-07 DIAGNOSIS — Z79899 Other long term (current) drug therapy: Secondary | ICD-10-CM | POA: Diagnosis not present

## 2020-08-07 DIAGNOSIS — M5136 Other intervertebral disc degeneration, lumbar region: Secondary | ICD-10-CM | POA: Diagnosis not present

## 2020-08-07 DIAGNOSIS — G894 Chronic pain syndrome: Secondary | ICD-10-CM | POA: Diagnosis not present

## 2020-08-30 DIAGNOSIS — R338 Other retention of urine: Secondary | ICD-10-CM | POA: Diagnosis not present

## 2020-09-06 DIAGNOSIS — M5136 Other intervertebral disc degeneration, lumbar region: Secondary | ICD-10-CM | POA: Diagnosis not present

## 2020-09-06 DIAGNOSIS — Z79899 Other long term (current) drug therapy: Secondary | ICD-10-CM | POA: Diagnosis not present

## 2020-09-06 DIAGNOSIS — G894 Chronic pain syndrome: Secondary | ICD-10-CM | POA: Diagnosis not present

## 2020-09-12 DIAGNOSIS — R3914 Feeling of incomplete bladder emptying: Secondary | ICD-10-CM | POA: Diagnosis not present

## 2020-09-18 DIAGNOSIS — R338 Other retention of urine: Secondary | ICD-10-CM | POA: Diagnosis not present

## 2020-09-18 DIAGNOSIS — N3020 Other chronic cystitis without hematuria: Secondary | ICD-10-CM | POA: Diagnosis not present

## 2020-09-18 DIAGNOSIS — R8271 Bacteriuria: Secondary | ICD-10-CM | POA: Diagnosis not present

## 2020-09-18 DIAGNOSIS — R3914 Feeling of incomplete bladder emptying: Secondary | ICD-10-CM | POA: Diagnosis not present

## 2020-10-02 ENCOUNTER — Ambulatory Visit: Payer: Medicare Other | Admitting: Family Medicine

## 2020-10-04 ENCOUNTER — Other Ambulatory Visit: Payer: Self-pay

## 2020-10-04 ENCOUNTER — Emergency Department (HOSPITAL_COMMUNITY): Payer: Medicare Other

## 2020-10-04 ENCOUNTER — Observation Stay (HOSPITAL_COMMUNITY): Payer: Medicare Other

## 2020-10-04 ENCOUNTER — Inpatient Hospital Stay (HOSPITAL_COMMUNITY)
Admission: EM | Admit: 2020-10-04 | Discharge: 2020-10-07 | DRG: 689 | Disposition: A | Discharge status: Home / Self Care | Payer: Medicare Other | Attending: Internal Medicine | Admitting: Internal Medicine

## 2020-10-04 ENCOUNTER — Encounter (HOSPITAL_COMMUNITY): Payer: Self-pay

## 2020-10-04 DIAGNOSIS — M25511 Pain in right shoulder: Secondary | ICD-10-CM | POA: Diagnosis present

## 2020-10-04 DIAGNOSIS — N390 Urinary tract infection, site not specified: Principal | ICD-10-CM | POA: Diagnosis present

## 2020-10-04 DIAGNOSIS — Z79899 Other long term (current) drug therapy: Secondary | ICD-10-CM

## 2020-10-04 DIAGNOSIS — Z7189 Other specified counseling: Secondary | ICD-10-CM | POA: Diagnosis not present

## 2020-10-04 DIAGNOSIS — R4182 Altered mental status, unspecified: Secondary | ICD-10-CM | POA: Diagnosis not present

## 2020-10-04 DIAGNOSIS — Z881 Allergy status to other antibiotic agents status: Secondary | ICD-10-CM | POA: Diagnosis not present

## 2020-10-04 DIAGNOSIS — Z66 Do not resuscitate: Secondary | ICD-10-CM | POA: Diagnosis not present

## 2020-10-04 DIAGNOSIS — R000 Tachycardia, unspecified: Secondary | ICD-10-CM | POA: Diagnosis not present

## 2020-10-04 DIAGNOSIS — F419 Anxiety disorder, unspecified: Secondary | ICD-10-CM | POA: Diagnosis present

## 2020-10-04 DIAGNOSIS — Z885 Allergy status to narcotic agent status: Secondary | ICD-10-CM

## 2020-10-04 DIAGNOSIS — N3001 Acute cystitis with hematuria: Secondary | ICD-10-CM

## 2020-10-04 DIAGNOSIS — G894 Chronic pain syndrome: Secondary | ICD-10-CM | POA: Diagnosis present

## 2020-10-04 DIAGNOSIS — F32A Depression, unspecified: Secondary | ICD-10-CM | POA: Diagnosis present

## 2020-10-04 DIAGNOSIS — Z515 Encounter for palliative care: Secondary | ICD-10-CM | POA: Diagnosis not present

## 2020-10-04 DIAGNOSIS — Z87440 Personal history of urinary (tract) infections: Secondary | ICD-10-CM

## 2020-10-04 DIAGNOSIS — E876 Hypokalemia: Secondary | ICD-10-CM | POA: Diagnosis present

## 2020-10-04 DIAGNOSIS — B9620 Unspecified Escherichia coli [E. coli] as the cause of diseases classified elsewhere: Secondary | ICD-10-CM | POA: Diagnosis present

## 2020-10-04 DIAGNOSIS — W19XXXD Unspecified fall, subsequent encounter: Secondary | ICD-10-CM | POA: Diagnosis not present

## 2020-10-04 DIAGNOSIS — E871 Hypo-osmolality and hyponatremia: Secondary | ICD-10-CM | POA: Diagnosis present

## 2020-10-04 DIAGNOSIS — Z880 Allergy status to penicillin: Secondary | ICD-10-CM | POA: Diagnosis not present

## 2020-10-04 DIAGNOSIS — N179 Acute kidney failure, unspecified: Secondary | ICD-10-CM | POA: Diagnosis not present

## 2020-10-04 DIAGNOSIS — R410 Disorientation, unspecified: Secondary | ICD-10-CM | POA: Diagnosis not present

## 2020-10-04 DIAGNOSIS — G9341 Metabolic encephalopathy: Secondary | ICD-10-CM | POA: Diagnosis not present

## 2020-10-04 DIAGNOSIS — G4700 Insomnia, unspecified: Secondary | ICD-10-CM | POA: Diagnosis present

## 2020-10-04 DIAGNOSIS — R1111 Vomiting without nausea: Secondary | ICD-10-CM | POA: Diagnosis not present

## 2020-10-04 DIAGNOSIS — R531 Weakness: Secondary | ICD-10-CM | POA: Diagnosis not present

## 2020-10-04 DIAGNOSIS — Z87891 Personal history of nicotine dependence: Secondary | ICD-10-CM

## 2020-10-04 DIAGNOSIS — Z20822 Contact with and (suspected) exposure to covid-19: Secondary | ICD-10-CM | POA: Diagnosis present

## 2020-10-04 DIAGNOSIS — I10 Essential (primary) hypertension: Secondary | ICD-10-CM | POA: Diagnosis not present

## 2020-10-04 DIAGNOSIS — J9811 Atelectasis: Secondary | ICD-10-CM | POA: Diagnosis not present

## 2020-10-04 DIAGNOSIS — W19XXXA Unspecified fall, initial encounter: Secondary | ICD-10-CM

## 2020-10-04 DIAGNOSIS — R296 Repeated falls: Secondary | ICD-10-CM | POA: Diagnosis present

## 2020-10-04 HISTORY — DX: Urinary tract infection, site not specified: N39.0

## 2020-10-04 LAB — COMPREHENSIVE METABOLIC PANEL
ALT: 24 U/L (ref 0–44)
AST: 30 U/L (ref 15–41)
Albumin: 3.9 g/dL (ref 3.5–5.0)
Alkaline Phosphatase: 109 U/L (ref 38–126)
Anion gap: 15 (ref 5–15)
BUN: 30 mg/dL — ABNORMAL HIGH (ref 8–23)
CO2: 21 mmol/L — ABNORMAL LOW (ref 22–32)
Calcium: 9 mg/dL (ref 8.9–10.3)
Chloride: 95 mmol/L — ABNORMAL LOW (ref 98–111)
Creatinine, Ser: 1.07 mg/dL — ABNORMAL HIGH (ref 0.44–1.00)
GFR, Estimated: 58 mL/min — ABNORMAL LOW (ref >60)
Glucose, Bld: 101 mg/dL — ABNORMAL HIGH (ref 70–99)
Potassium: 3 mmol/L — ABNORMAL LOW (ref 3.5–5.1)
Sodium: 131 mmol/L — ABNORMAL LOW (ref 135–145)
Total Bilirubin: 0.8 mg/dL (ref 0.3–1.2)
Total Protein: 8.8 g/dL — ABNORMAL HIGH (ref 6.5–8.1)

## 2020-10-04 LAB — POC SARS CORONAVIRUS 2 AG -  ED: SARS Coronavirus 2 Ag: NEGATIVE

## 2020-10-04 LAB — CBC WITH DIFFERENTIAL/PLATELET
Abs Immature Granulocytes: 0.05 10*3/uL (ref 0.00–0.07)
Basophils Absolute: 0 10*3/uL (ref 0.0–0.1)
Basophils Relative: 0 %
Eosinophils Absolute: 0 10*3/uL (ref 0.0–0.5)
Eosinophils Relative: 0 %
HCT: 38.7 % (ref 36.0–46.0)
Hemoglobin: 13 g/dL (ref 12.0–15.0)
Immature Granulocytes: 0 %
Lymphocytes Relative: 9 %
Lymphs Abs: 1.2 10*3/uL (ref 0.7–4.0)
MCH: 31.6 pg (ref 26.0–34.0)
MCHC: 33.6 g/dL (ref 30.0–36.0)
MCV: 93.9 fL (ref 80.0–100.0)
Monocytes Absolute: 1.6 10*3/uL — ABNORMAL HIGH (ref 0.1–1.0)
Monocytes Relative: 12 %
Neutro Abs: 10.2 10*3/uL — ABNORMAL HIGH (ref 1.7–7.7)
Neutrophils Relative %: 79 %
Platelets: 238 10*3/uL (ref 150–400)
RBC: 4.12 MIL/uL (ref 3.87–5.11)
RDW: 12.6 % (ref 11.5–15.5)
WBC: 13 10*3/uL — ABNORMAL HIGH (ref 4.0–10.5)
nRBC: 0 % (ref 0.0–0.2)

## 2020-10-04 LAB — LACTIC ACID, PLASMA
Lactic Acid, Venous: 0.9 mmol/L (ref 0.5–1.9)
Lactic Acid, Venous: 1.2 mmol/L (ref 0.5–1.9)

## 2020-10-04 LAB — RAPID URINE DRUG SCREEN, HOSP PERFORMED
Amphetamines: NOT DETECTED
Barbiturates: NOT DETECTED
Benzodiazepines: NOT DETECTED
Cocaine: NOT DETECTED
Opiates: NOT DETECTED
Tetrahydrocannabinol: NOT DETECTED

## 2020-10-04 LAB — URINALYSIS, ROUTINE W REFLEX MICROSCOPIC
Bilirubin Urine: NEGATIVE
Glucose, UA: NEGATIVE mg/dL
Ketones, ur: 20 mg/dL — AB
Nitrite: NEGATIVE
Protein, ur: 100 mg/dL — AB
Specific Gravity, Urine: 1.017 (ref 1.005–1.030)
WBC, UA: >50 WBC/hpf — ABNORMAL HIGH (ref 0–5)
pH: 5 (ref 5.0–8.0)

## 2020-10-04 LAB — ETHANOL: Alcohol, Ethyl (B): <10 mg/dL (ref <10)

## 2020-10-04 LAB — MAGNESIUM: Magnesium: 1.9 mg/dL (ref 1.7–2.4)

## 2020-10-04 IMAGING — CT CT HEAD W/O CM
3 of 4 series · 14 of 47 positions shown, 16 images · non-contrast
Comparison: CT head [DATE]

CLINICAL DATA: Delirium

EXAM:
CT HEAD WITHOUT CONTRAST
TECHNIQUE: Contiguous axial images were obtained from the base of the skull
through the vertex without intravenous contrast.

[Series 2: head wo · axial · 0.47mm/px · z∈[+1591,+1711]mm · 8 of 30 slices shown, 10 images]
[im 3/30  brain]
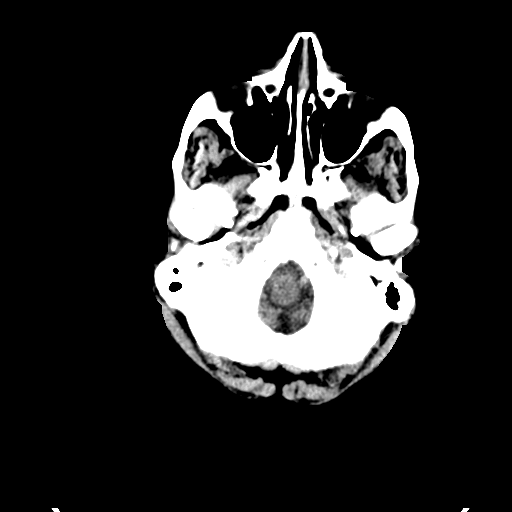
[im 3/30  bone]
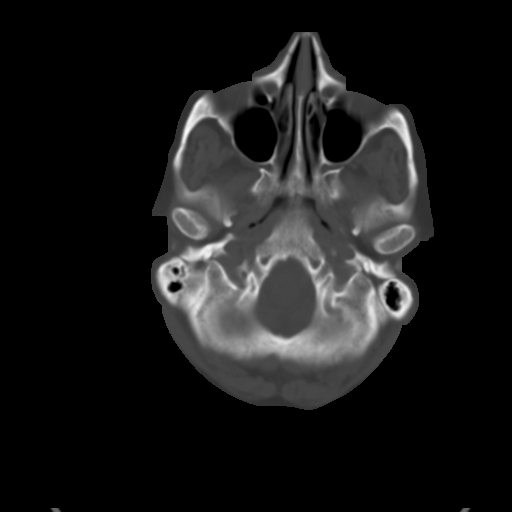
[im 6/30  brain]
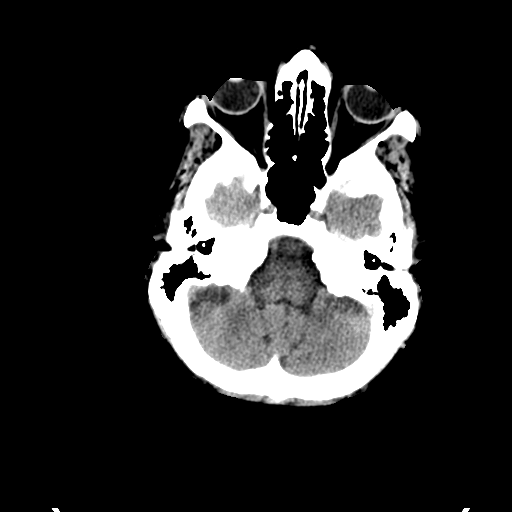
[im 9/30  brain]
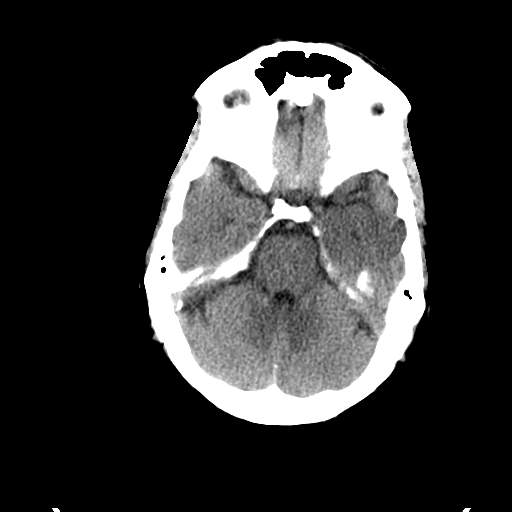
[im 12/30  brain]
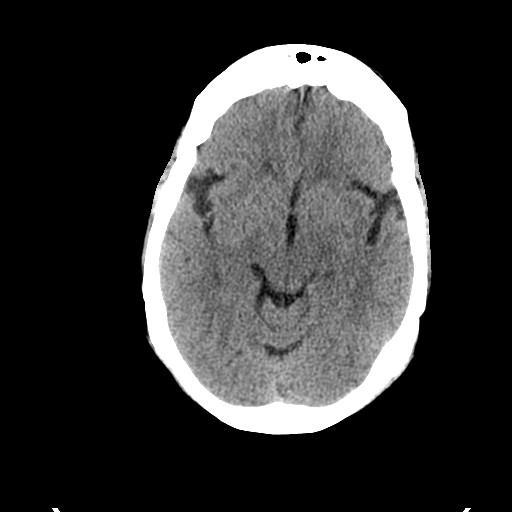
[im 18/30  brain]
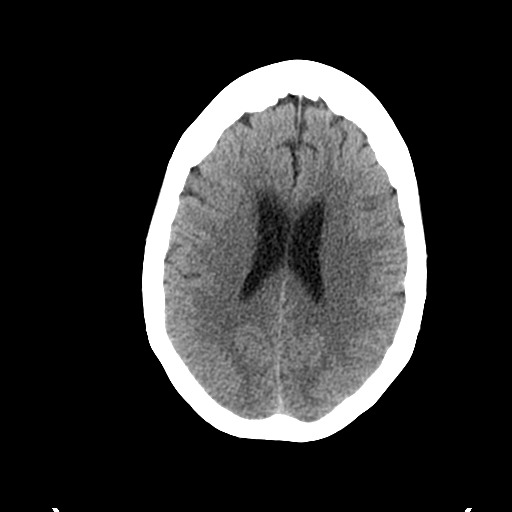
[im 18/30  bone]
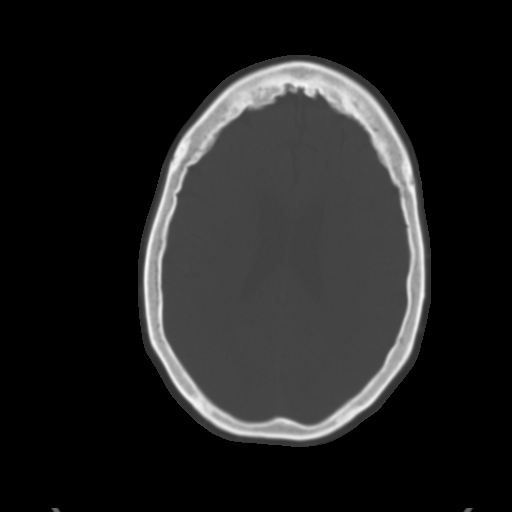
[im 21/30  brain]
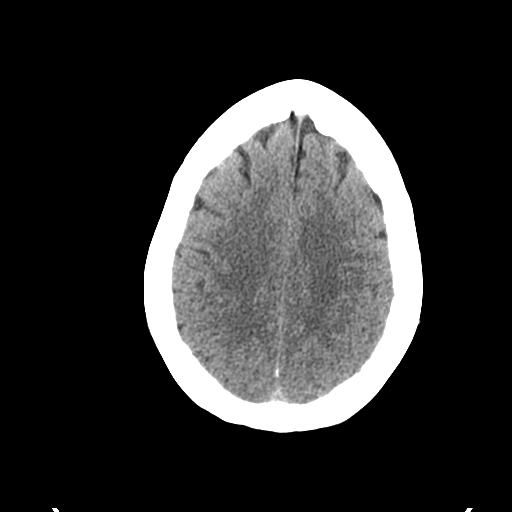
[im 24/30  brain]
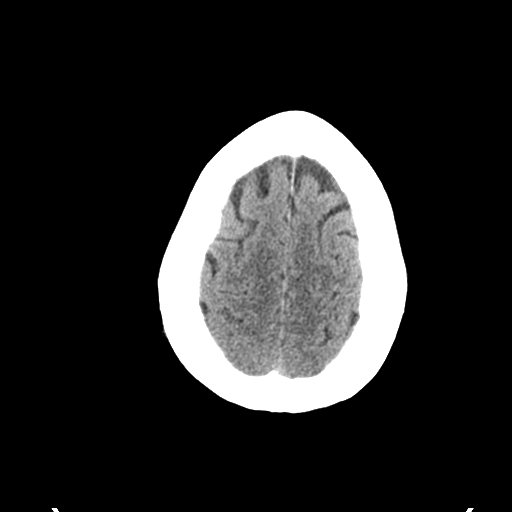
[im 27/30  brain]
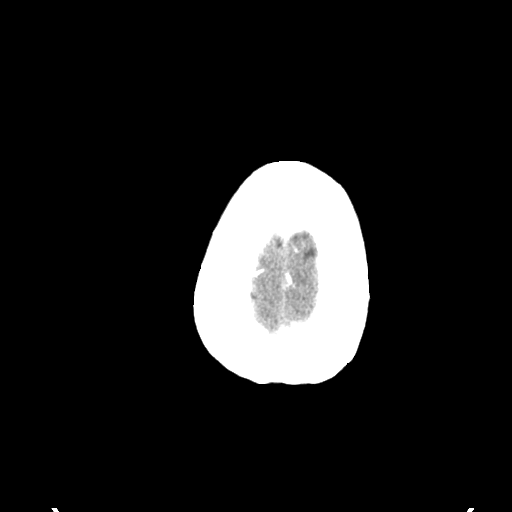

[Series 4: coronal soft tissue · coronal · 0.35mm/px · 3 of 68 slices shown]
[im 23/68  brain]
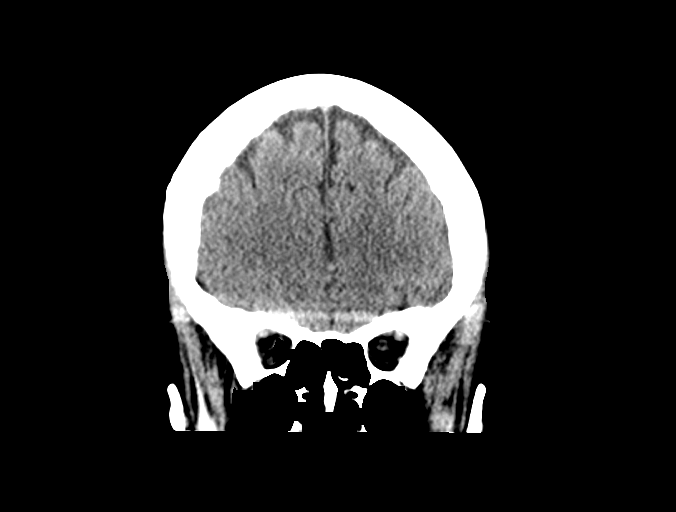
[im 30/68  brain]
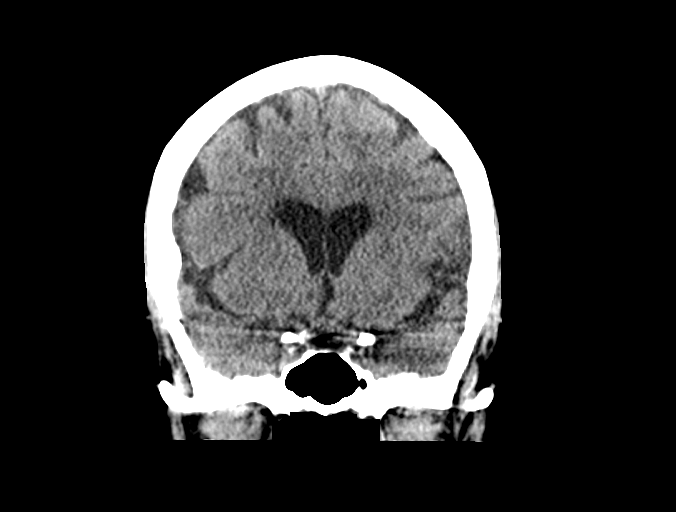
[im 38/68  brain]
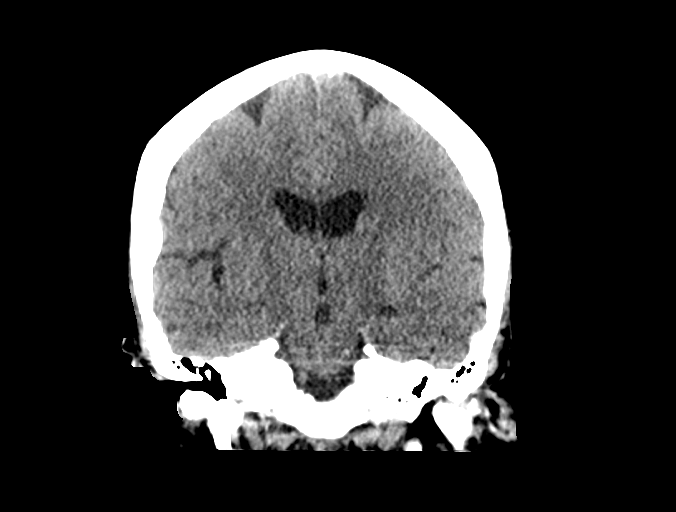

[Series 5: sagittal soft tissue · sagittal · 0.36mm/px · 3 of 47 slices shown]
[im 16/47  brain]
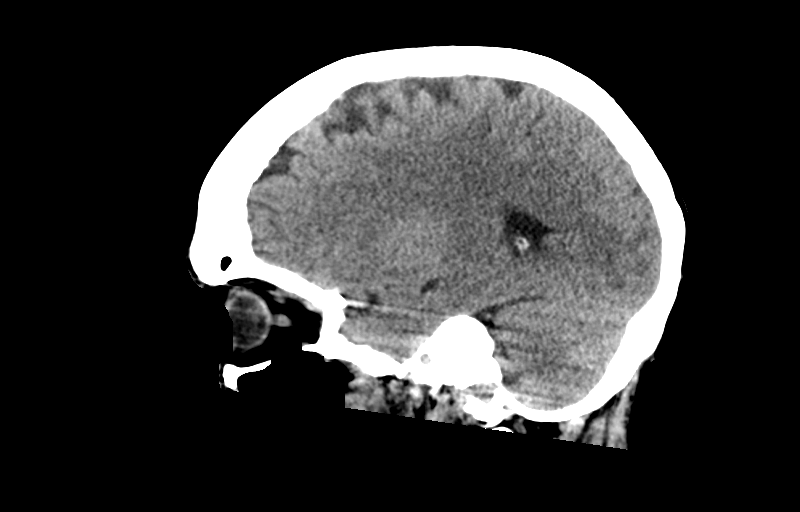
[im 24/47  brain]
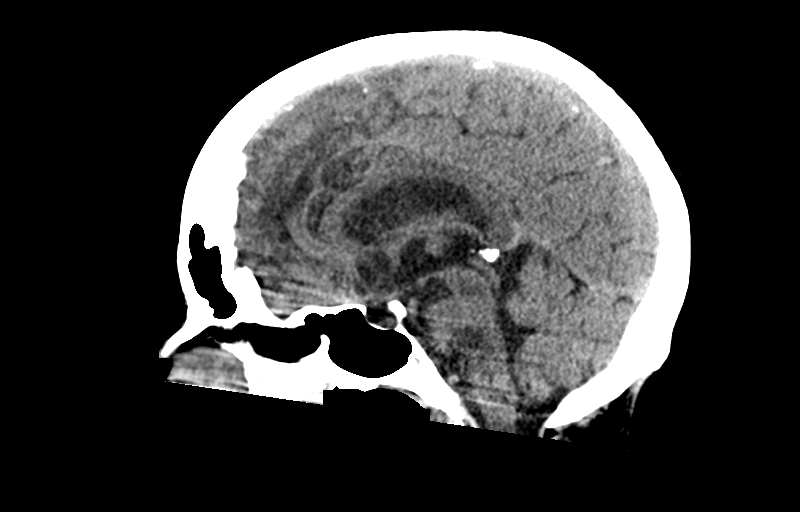
[im 31/47  brain]
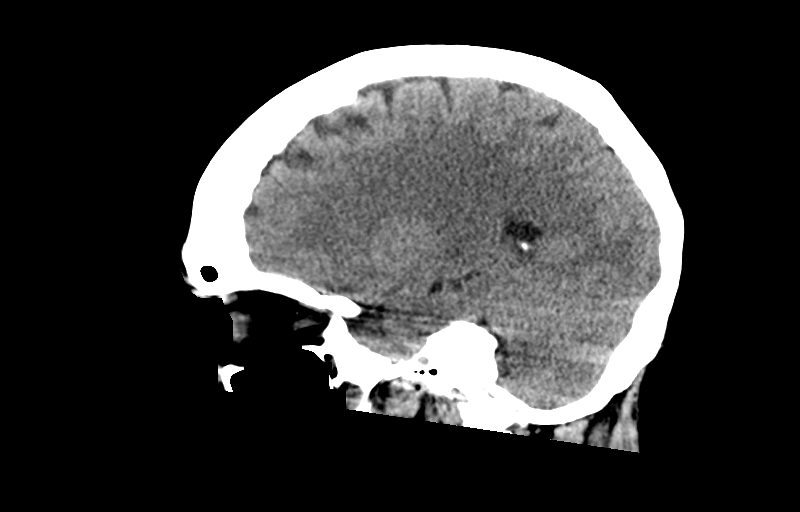

[14 of 47 positions shown; findings below may reference images not displayed]

FINDINGS: Brain: No evidence of acute infarction, hemorrhage, hydrocephalus,
extra-axial collection or mass lesion/mass effect.

Vascular: Negative for hyperdense vessel

Skull: Negative

Sinuses/Orbits: Paranasal sinuses clear.  Negative orbit

Other: None
IMPRESSION: Negative CT head

## 2020-10-04 IMAGING — DX DG CHEST 1V PORT
1 series · 1 of 1 positions shown · non-contrast
Comparison: [DATE]

CLINICAL DATA: Altered mental status

EXAM:
PORTABLE CHEST 1 VIEW

[chest ap]
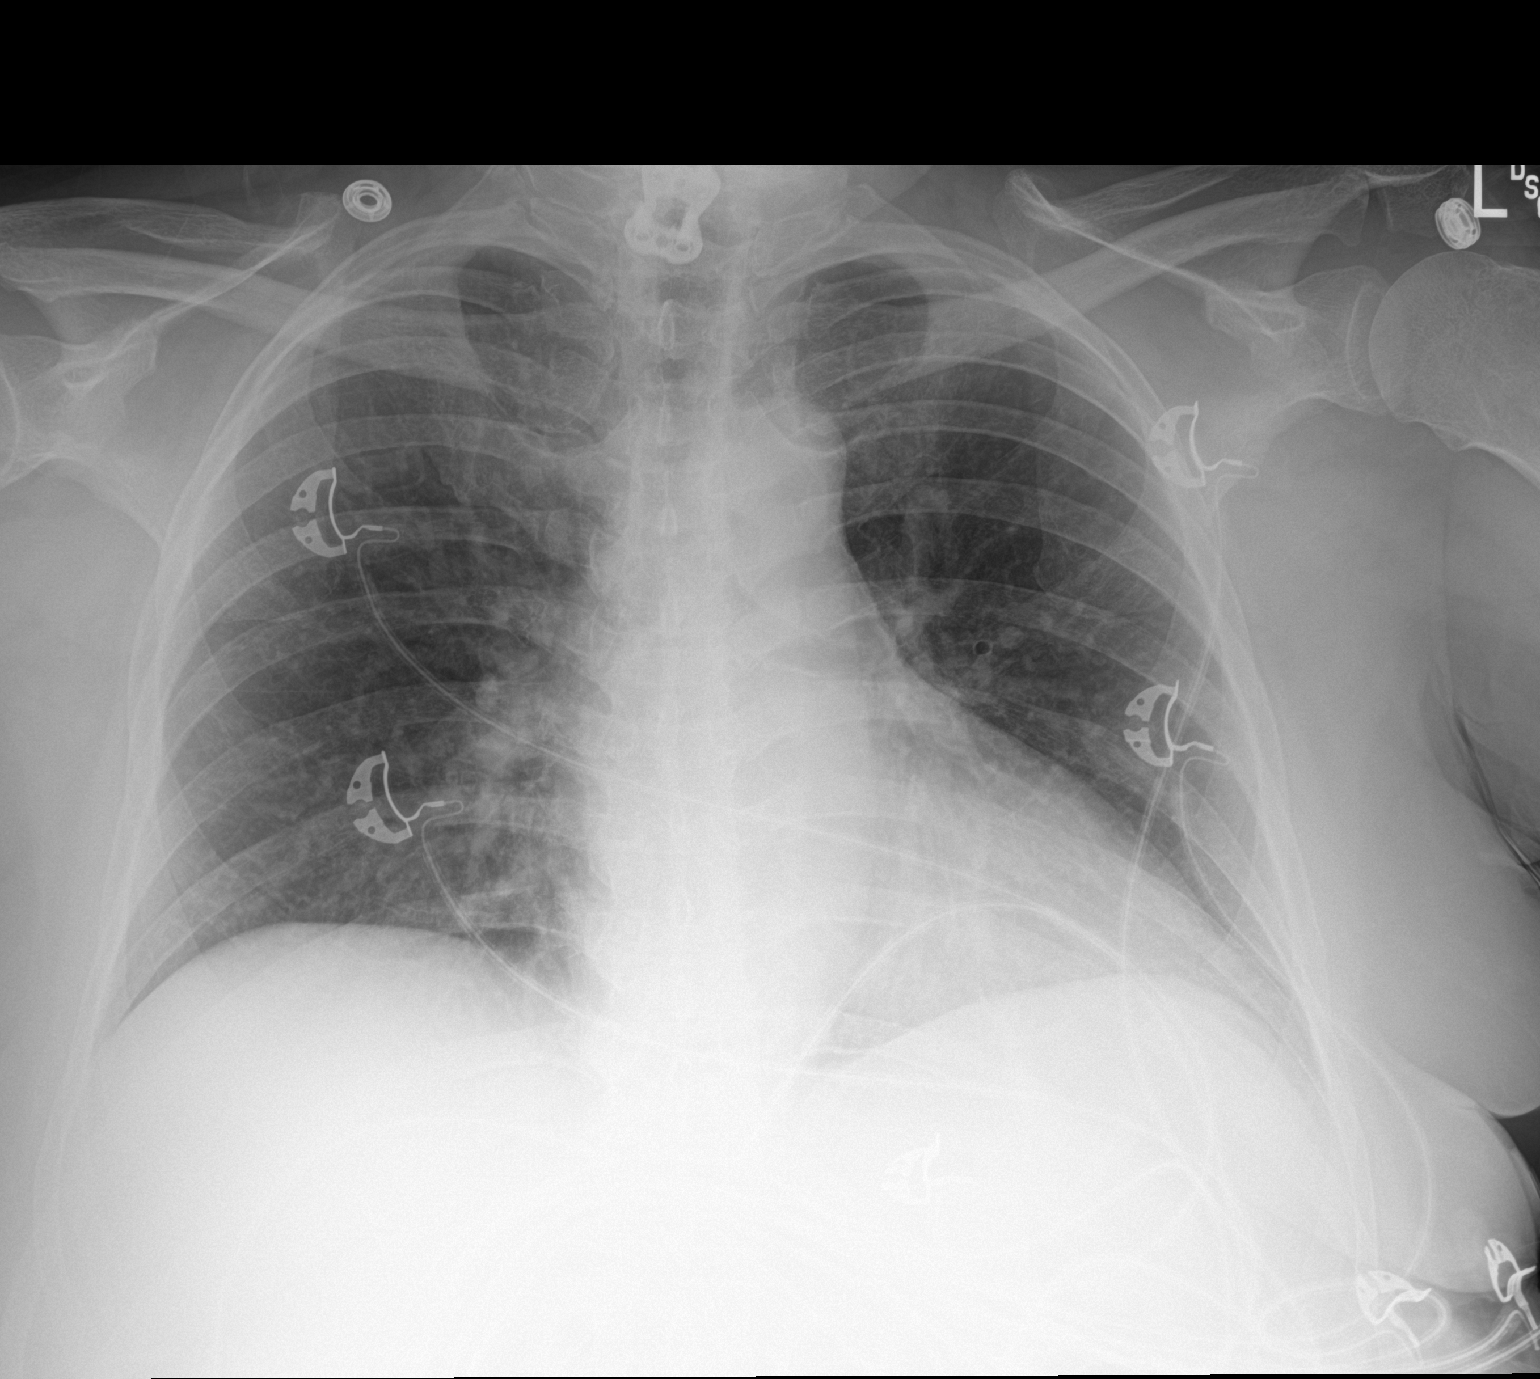

[1 of 1 positions shown; findings below may reference images not displayed]

FINDINGS: Heart size upper normal. Vascularity normal. Negative for heart
failure. Slight bibasilar atelectasis. Negative for pneumonia or
effusion
IMPRESSION: Slight bibasilar atelectasis.

## 2020-10-04 IMAGING — US US RENAL
1 series · 14 of 25 positions shown · non-contrast
Comparison: None.

CLINICAL DATA: Acute renal insufficiency

EXAM:
RENAL / URINARY TRACT ULTRASOUND COMPLETE

[Series 1: us renal · 14 of 26 slices shown]
[im 1/26]
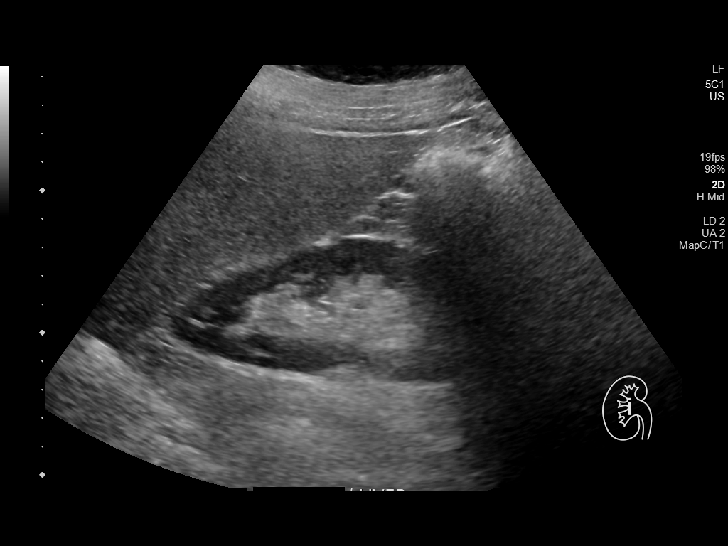
[im 3/26]
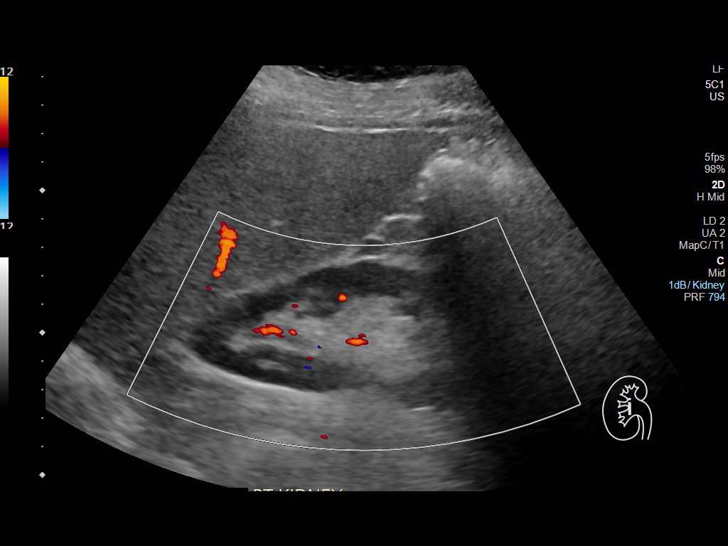
[im 5/26]
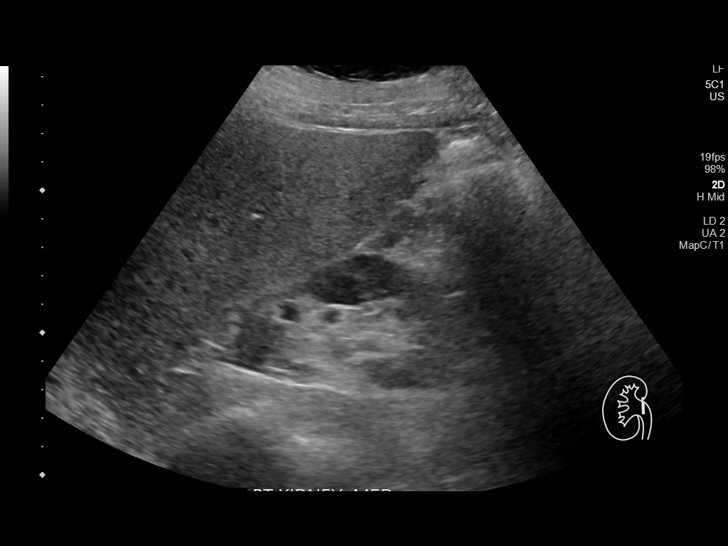
[im 7/26]
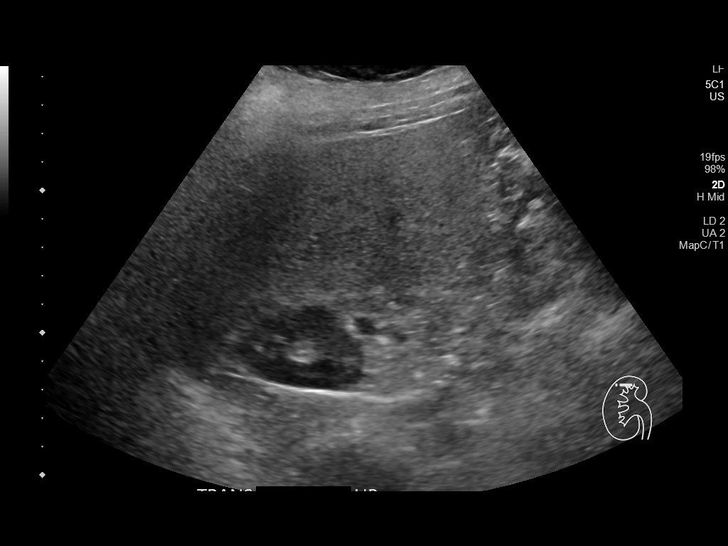
[im 9/26]
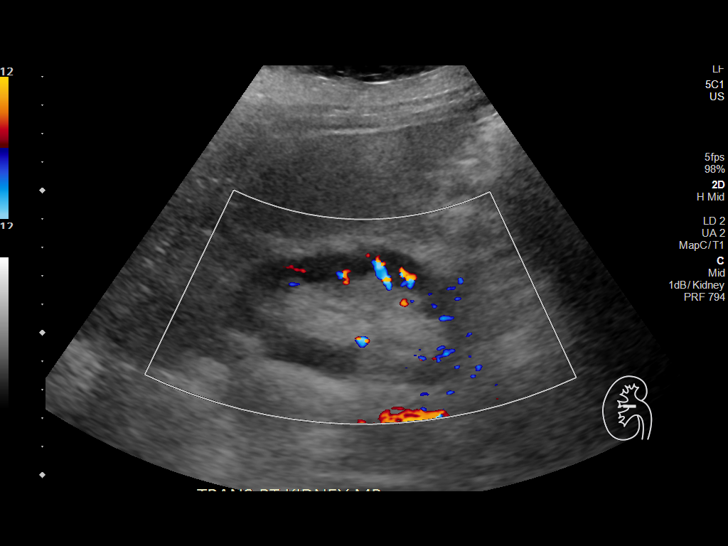
[im 10/26]
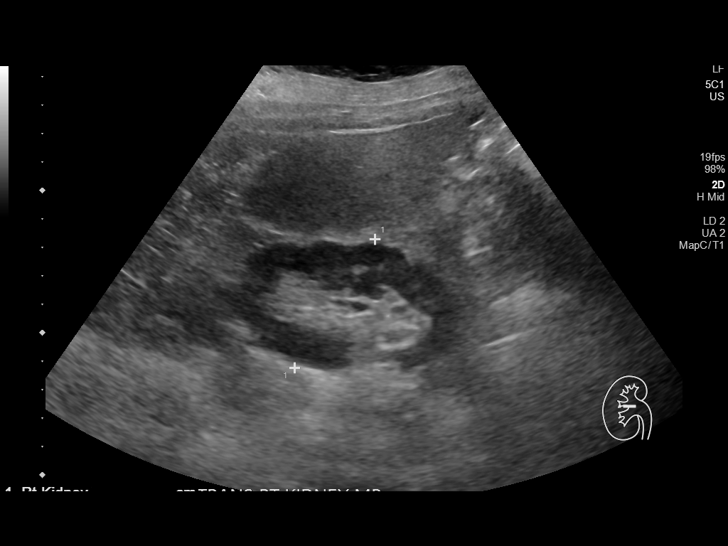
[im 12/26]
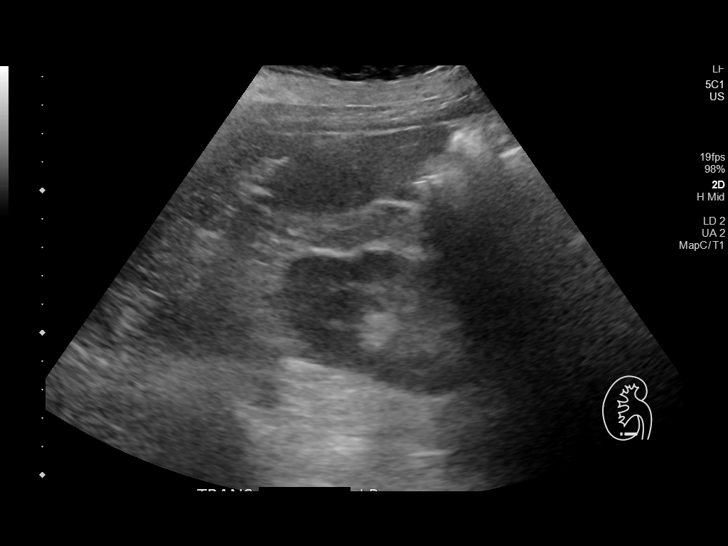
[im 14/26]
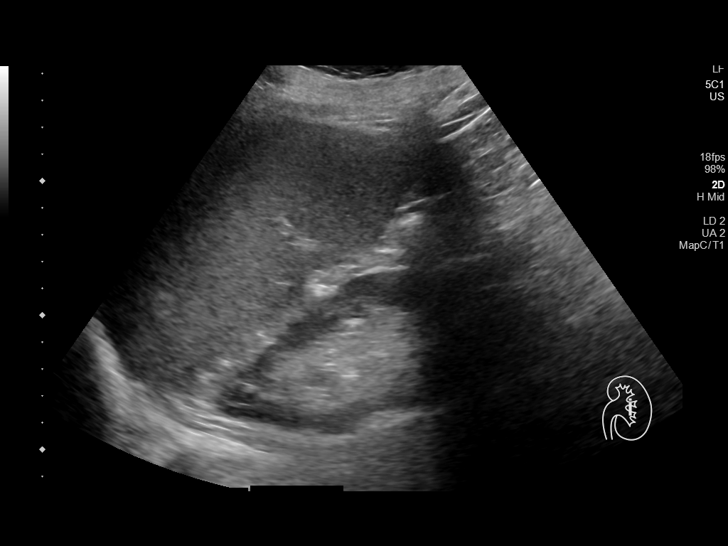
[im 16/26]
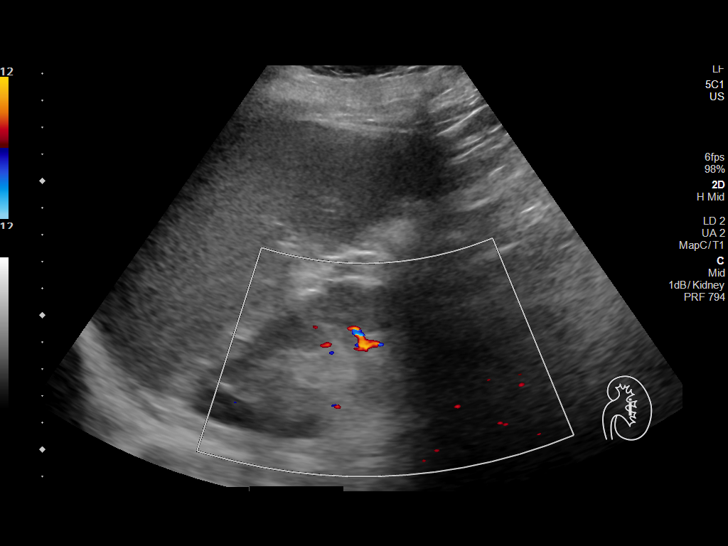
[im 17/26]
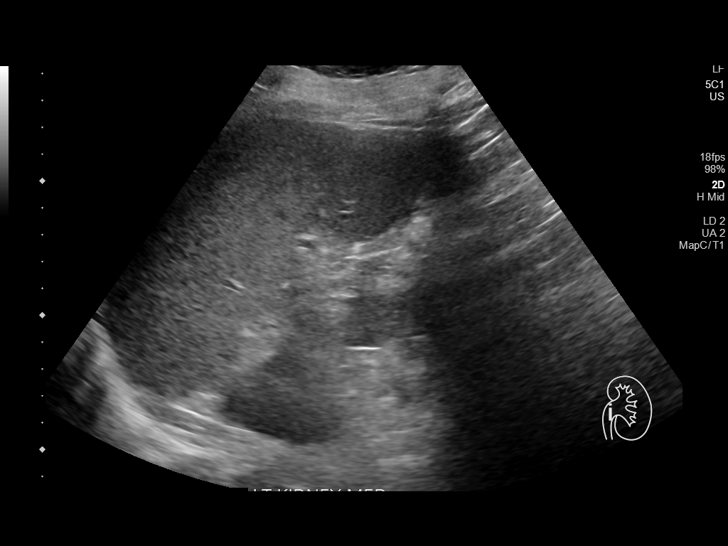
[im 19/26]
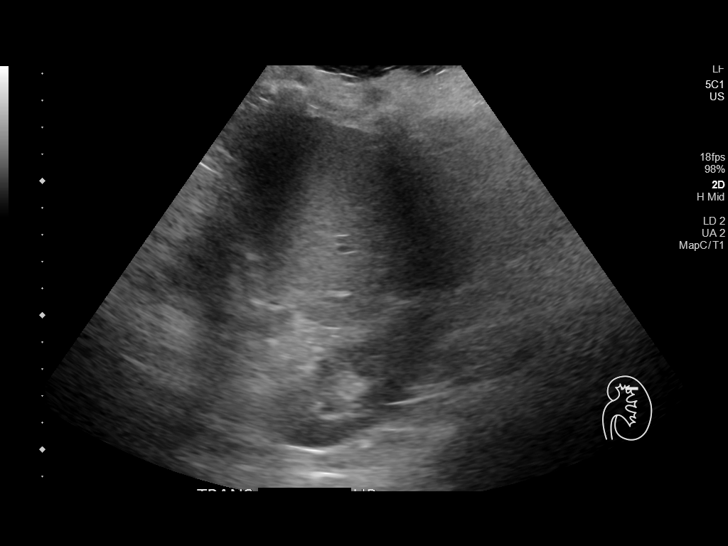
[im 21/26]
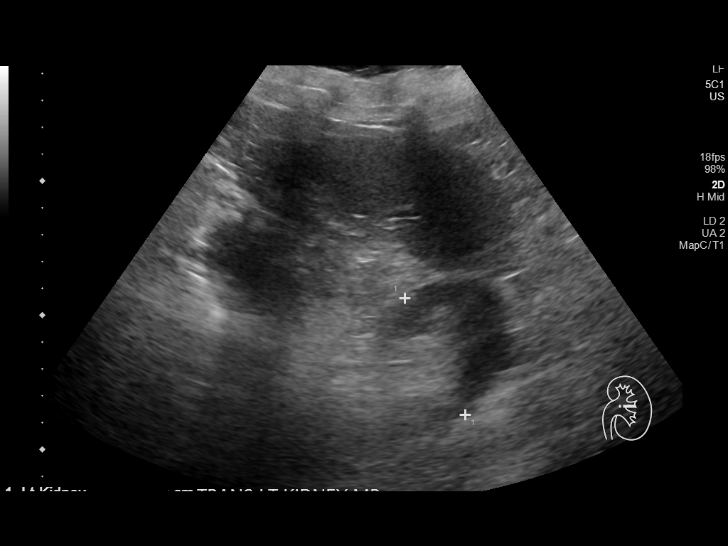
[im 23/26]
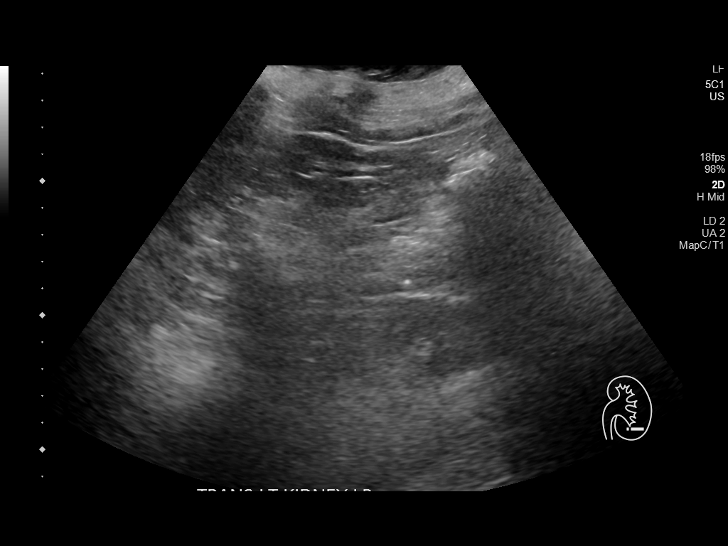
[im 26/26]
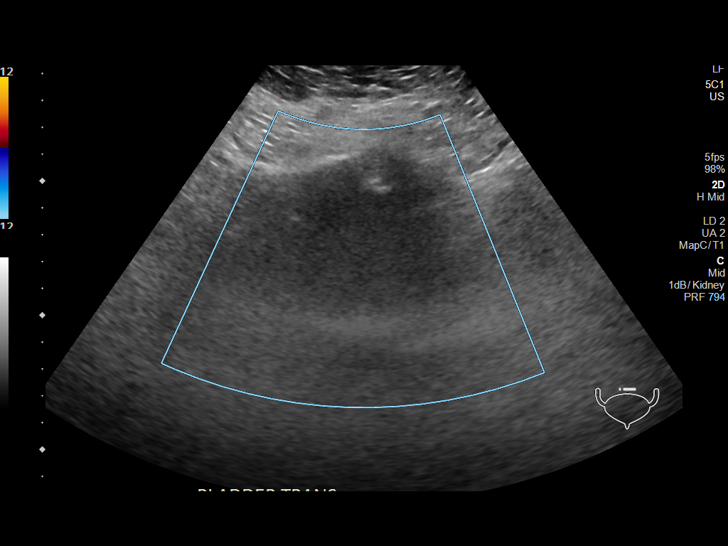

[14 of 25 positions shown; findings below may reference images not displayed]

FINDINGS: Right Kidney:

Renal measurements: 12.0 x 4.9 by 5.3 cm = volume: 164.8 mL.
Echogenicity within normal limits. No mass or hydronephrosis
visualized.

Left Kidney:

Renal measurements: 12.2 x 5.4 x 4.9 cm = volume: 169.3 mL.
Echogenicity within normal limits. No mass or hydronephrosis
visualized.

Bladder:

Bladder is decompressed, and cannot be evaluated.

Other:

None.
IMPRESSION: 1. Unremarkable renal ultrasound.
2. Decompressed bladder, limiting evaluation.

## 2020-10-04 MED ORDER — ONDANSETRON HCL 4 MG/2ML IJ SOLN
4.0000 mg | Freq: Once | INTRAMUSCULAR | Status: AC
  Administered 2020-10-04: 4 mg via INTRAVENOUS
  Filled 2020-10-04: qty 2

## 2020-10-04 MED ORDER — SODIUM CHLORIDE 0.9 % IV SOLN
1.0000 g | INTRAVENOUS | Status: DC
  Administered 2020-10-05 – 2020-10-07 (×3): 1 g via INTRAVENOUS
  Filled 2020-10-04 (×2): qty 10
  Filled 2020-10-04: qty 1

## 2020-10-04 MED ORDER — ENOXAPARIN SODIUM 40 MG/0.4ML ~~LOC~~ SOLN
40.0000 mg | SUBCUTANEOUS | Status: DC
  Administered 2020-10-04 – 2020-10-06 (×3): 40 mg via SUBCUTANEOUS
  Filled 2020-10-04 (×3): qty 0.4

## 2020-10-04 MED ORDER — POTASSIUM CHLORIDE CRYS ER 20 MEQ PO TBCR
20.0000 meq | EXTENDED_RELEASE_TABLET | Freq: Two times a day (BID) | ORAL | Status: AC
  Administered 2020-10-04 – 2020-10-05 (×2): 20 meq via ORAL
  Filled 2020-10-04 (×2): qty 1

## 2020-10-04 MED ORDER — SODIUM CHLORIDE 0.9 % IV SOLN
1.0000 g | Freq: Once | INTRAVENOUS | Status: AC
  Administered 2020-10-04: 1 g via INTRAVENOUS
  Filled 2020-10-04: qty 10

## 2020-10-04 MED ORDER — ONDANSETRON HCL 4 MG/2ML IJ SOLN: 4.0000 mg | Freq: Four times a day (QID) | INTRAMUSCULAR | Status: DC | PRN

## 2020-10-04 MED ORDER — POTASSIUM CHLORIDE CRYS ER 20 MEQ PO TBCR
40.0000 meq | EXTENDED_RELEASE_TABLET | Freq: Once | ORAL | Status: AC
  Administered 2020-10-04: 40 meq via ORAL
  Filled 2020-10-04: qty 2

## 2020-10-04 MED ORDER — ACETAMINOPHEN 650 MG RE SUPP: 650.0000 mg | Freq: Four times a day (QID) | RECTAL | Status: DC | PRN

## 2020-10-04 MED ORDER — SODIUM CHLORIDE 0.9 % IV SOLN: INTRAVENOUS | Status: DC

## 2020-10-04 MED ORDER — ONDANSETRON HCL 4 MG PO TABS: 4.0000 mg | ORAL_TABLET | Freq: Four times a day (QID) | ORAL | Status: DC | PRN

## 2020-10-04 MED ORDER — SODIUM CHLORIDE 0.9 % IV BOLUS
1000.0000 mL | Freq: Once | INTRAVENOUS | Status: AC
  Administered 2020-10-04: 1000 mL via INTRAVENOUS

## 2020-10-04 MED ORDER — ACETAMINOPHEN 325 MG PO TABS
650.0000 mg | ORAL_TABLET | Freq: Four times a day (QID) | ORAL | Status: DC | PRN
  Administered 2020-10-04 – 2020-10-05 (×2): 650 mg via ORAL
  Filled 2020-10-04 (×2): qty 2

## 2020-10-04 NOTE — ED Notes (Signed)
Patient off floor CT scan

## 2020-10-04 NOTE — ED Notes (Signed)
COVID TEST RESULT  NEG

## 2020-10-04 NOTE — ED Triage Notes (Signed)
Pt BIB EMS from home. Pt lives alone. Pt does not have family in the area. Hx of recurrent UTI, pt home caths herself frequently. Pt c/o N/V x2 days. Family friend called EMS due to pt being confused today. EMS reports pt does have some confusion regarding day and month. EMS reports this is common for pt when she has a UTI. EMS reports pt having positive orthostatics.   139/80 99% RA CBG 138

## 2020-10-04 NOTE — ED Provider Notes (Signed)
Stephanie Carrillo   CSN: [id number] Arrival date & time: 10/04/20  1308     History Chief Complaint  Patient presents with  . Possible UTI  . Altered Mental Status    Stephanie Carrillo is a 65 y.o. female possible history of insomnia, recurrent UTIs, substance abuse who presents for evaluation of altered mental status.  Patient states that she has had urinary tract infection for a few days now.  She states she knows when it feels like she has a UTI.  She states that she has had a fever and that it is "gone from 99-1 100-200."  She has also had nausea/vomiting.  Per EMS, family friend was checking up on patient today and noted her to be confused.  She had told him that she was having some nausea/vomiting x2 days.  Patient is a self-catheterization patient.  EM LEVEL 5 CAVEAT DUE TO AMS  The history is provided by the patient.       Past Medical History:  Diagnosis Date  . Anxiety   . Depression   . Hypertension   . Insomnia   . Meningitis   . Recurrent UTI   . Seizures (HCC)   . Substance abuse [facility])     Patient Active Problem List   Diagnosis Date Noted  . Nausea vomiting and diarrhea   . Acute encephalopathy 02/23/2020  . Benzodiazepine withdrawal (HCC) 02/01/2020  . Hypokalemia   . Tachycardia   . Altered mental state 01/27/2020  . Anxiety and depression 09/23/2018  . Insomnia 09/23/2018  . Chronic pain syndrome 09/23/2018    Past Surgical History:  Procedure Laterality Date  . BACK SURGERY    . CERVICAL DISC SURGERY    . FRACTURE SURGERY N/A    Phreesia 05/07/2020  . SPINE SURGERY N/A    Phreesia 05/07/2020     OB History   No obstetric history on file.     Family History  Problem Relation Age of Onset  . Multiple sclerosis Mother   . Cancer Father     Social History   Tobacco Use  . Smoking status: Former Games developer  . Smokeless tobacco: Never Used  Vaping Use  . Vaping Use: Never used  Substance Use  Topics  . Alcohol use: Not Currently  . Drug use: Not Currently    Comment: opiates -clean 6 months    Home Medications Prior to Admission medications   Medication Sig Start Date End Date Taking? Authorizing Provider  amLODipine (NORVASC) 5 MG tablet Take 1 tablet (5 mg total) by mouth daily. 09/25/18   [place], Alvira Philips, DO  buprenorphine (SUBUTEX) 2 MG SUBL SL tablet Place 2 mg under the tongue every 4 (four) hours.  01/05/20   [provider]  clonazePAM (KLONOPIN) 0.5 MG tablet Take 1 tablet (0.5 mg total) by mouth 3 (three) times daily as needed for anxiety. 02/24/20   Yvette Rack, MD  diphenhydrAMINE (BENADRYL) 25 MG tablet Take 25 mg by mouth at bedtime as needed for sleep.    [provider]  gabapentin (NEURONTIN) 800 MG tablet Take 800 mg by mouth 3 (three) times daily.    [provider]  ibuprofen (ADVIL,MOTRIN) 100 MG tablet Take 200 mg by mouth 2 (two) times daily as needed for pain.    [provider]  ondansetron (ZOFRAN) 4 MG tablet Take 1 tablet (4 mg total) by mouth every 6 (six) hours as needed for nausea. Patient not taking: Reported  on 01/27/2020 09/25/18   [place], Alvira Philips, DO  tiZANidine (ZANAFLEX) 2 MG tablet Take 2-4 mg by mouth 3 (three) times daily. 02/01/20   [provider]    Allergies    Ciprofloxacin, Other, Penicillins, and Oxycodone  Review of Systems   Review of Systems  Unable to perform ROS: Mental status change    Physical Exam Updated Vital Signs BP 121/82   Pulse (!) 103   Temp 100.1 F (37.8 C) (Oral)   Resp 14   SpO2 94%   Physical Exam Vitals and nursing Carrillo reviewed.  Constitutional:      Appearance: Normal appearance. She is well-developed and well-nourished.  HENT:     Head: Normocephalic and atraumatic.     Mouth/Throat:     Mouth: Oropharynx is clear and moist and mucous membranes are normal.  Eyes:     General: Lids are normal.     Extraocular Movements: EOM normal.      Conjunctiva/sclera: Conjunctivae normal.     Pupils: Pupils are equal, round, and reactive to light.  Neck:     Comments: Neck is supple and without rigidity  Cardiovascular:     Rate and Rhythm: Normal rate and regular rhythm.     Pulses: Normal pulses.     Heart sounds: Normal heart sounds. No murmur heard. No friction rub. No gallop.   Pulmonary:     Effort: Pulmonary effort is normal.     Breath sounds: Normal breath sounds.     Comments: Lungs clear to auscultation bilaterally.  Symmetric chest rise.  No wheezing, rales, rhonchi. Abdominal:     Palpations: Abdomen is soft. Abdomen is not rigid.     Tenderness: There is no abdominal tenderness. There is no guarding.     Comments: Abdomen is soft, non-distended, non-tender. No rigidity, No guarding. No peritoneal signs.  No CVA tenderness noted bilaterally.  Musculoskeletal:        General: Normal range of motion.     Cervical back: Full passive range of motion without pain.  Skin:    General: Skin is warm and dry.     Capillary Refill: Capillary refill takes less than 2 seconds.  Neurological:     Mental Status: She is alert.     Comments: Alert and oriented x2.  When asked what year it is she says 2021.  Follows all commands. 5/5 strength BUE and BLE  Psychiatric:        Mood and Affect: Mood and affect normal.        Speech: Speech normal.     ED Results / Procedures / Treatments   Labs (all labs ordered are listed, but only abnormal results are displayed) Labs Reviewed  COMPREHENSIVE METABOLIC PANEL - Abnormal; Notable for the following components:      Result Value   Sodium 131 (*)    Potassium 3.0 (*)    Chloride 95 (*)    CO2 21 (*)    Glucose, Bld 101 (*)    BUN 30 (*)    Creatinine, Ser 1.07 (*)    Total Protein 8.8 (*)    GFR, Estimated 58 (*)    All other components within normal limits  CBC WITH DIFFERENTIAL/PLATELET - Abnormal; Notable for the following components:   WBC 13.0 (*)    Neutro Abs 10.2  (*)    Monocytes Absolute 1.6 (*)    All other components within normal limits  URINALYSIS, ROUTINE W REFLEX MICROSCOPIC - Abnormal; Notable for  the following components:   APPearance HAZY (*)    Hgb urine dipstick MODERATE (*)    Ketones, ur 20 (*)    Protein, ur 100 (*)    Leukocytes,Ua LARGE (*)    WBC, UA >50 (*)    Bacteria, UA MANY (*)    All other components within normal limits  CULTURE, BLOOD (ROUTINE X 2)  CULTURE, BLOOD (ROUTINE X 2)  URINE CULTURE  LACTIC ACID, PLASMA  RAPID URINE DRUG SCREEN, HOSP PERFORMED  ETHANOL  LACTIC ACID, PLASMA  POC SARS CORONAVIRUS 2 AG -  ED    EKG None  Radiology CT Head Wo Contrast  Result Date: 10/04/2020 CLINICAL DATA:  Delirium EXAM: CT HEAD WITHOUT CONTRAST TECHNIQUE: Contiguous axial images were obtained from the base of the skull through the vertex without intravenous contrast. COMPARISON:  CT head 02/24/2020 FINDINGS: Brain: No evidence of acute infarction, hemorrhage, hydrocephalus, extra-axial collection or mass lesion/mass effect. Vascular: Negative for hyperdense vessel Skull: Negative Sinuses/Orbits: Paranasal sinuses clear.  Negative orbit Other: None IMPRESSION: Negative CT head Electronically Signed   By: Marlan Palauharles  Clark M.D.   On: 10/04/2020 14:55   DG Chest Portable 1 View  Result Date: 10/04/2020 CLINICAL DATA:  Altered mental status EXAM: PORTABLE CHEST 1 VIEW COMPARISON:  02/23/2020 FINDINGS: Heart size upper normal. Vascularity normal. Negative for heart failure. Slight bibasilar atelectasis. Negative for pneumonia or effusion IMPRESSION: Slight bibasilar atelectasis. Electronically Signed   By: Marlan Palauharles  Clark M.D.   On: 10/04/2020 14:58    Procedures Procedures   Medications Ordered in ED Medications  cefTRIAXone (ROCEPHIN) 1 g in sodium chloride 0.9 % 100 mL IVPB (1 g Intravenous New Bag/Given 10/04/20 1527)  ondansetron (ZOFRAN) injection 4 mg (4 mg Intravenous Given 10/04/20 1414)  sodium chloride 0.9 % bolus 1,000  mL (0 mLs Intravenous Stopped 10/04/20 1515)  potassium chloride SA (KLOR-CON) CR tablet 40 mEq (40 mEq Oral Given 10/04/20 1527)    ED Course  I have reviewed the triage vital signs and the nursing notes.  Pertinent labs & imaging results that were available during my care of the patient were reviewed by me and considered in my medical decision making (see chart for details).    MDM Rules/Calculators/A&P                          65 year old female who presents for evaluation of altered mental status, nausea/vomiting.  Neighbors checked up on her today and noted her to be slightly confused.  Patient told them that she has had a urinary tract function.  They report that she self caths and does frequently get UTIs.  On initial arrival, she has a low-grade fever 100.1.  Vitals otherwise stable.  She reports some mild abdominal pain, nausea/vomiting.  She does seem slightly confused.  She is able to tell me where she is and what her name is but when asked her what year, she says 2021.  She will start saying that she checked it "and it was 99 and then 100 and then 200 ..." She will start speaking and then sometimes space out and not finish her sentences.  She is redirectable.  She follows all commands and has full range of motion of all extremities.  Neck is supple and without rigidity.  No meningismal signs.  We will plan to check labs, urine, head CT.  CBC shows slight leukocytosis of 13.0. COVID negative.  CMP shows potassium 3.0.  BUN/creatinine at  30, 1.07 respectively.  UA shows moderate hemoglobin, leukocytes, pyuria.  Urine culture sent.  Patient started on Rocephin.  She has an allergy to penicillin but has tolerated cephalosporins in the past.  CT head negative for any acute abnormality. CXR negative.  At this time, given UTI as well as delirium, will plan to admit her.  Patient started on antibiotics.  Discussed patient with Dr. Ronaldo Miyamoto who accepts patient for admission.   Portions of this Carrillo  were generated with Scientist, clinical (histocompatibility and immunogenetics). Dictation errors may occur despite best attempts at proofreading.   Final Clinical Impression(s) / ED Diagnoses Final diagnoses:  Acute cystitis with hematuria    Rx / DC Orders ED Discharge Orders    None       Maxwell Caul, PA-C 10/04/20 1541    Cathren Laine, MD 10/05/20 1318

## 2020-10-04 NOTE — H&P (Signed)
History and Physical    Stephanie Carrillo ZDG:[id number] DOB: [birth year] DOA: 10/04/2020  PCP: Carmel Sacramento, NP  Patient coming from: Home  Chief Complaint: UTI, confusion  HPI: Stephanie Carrillo is a 65 y.o. female with medical history significant of HTN, recurrent UTI, depression/anxiety. Presenting with confusion. History is from friend who checks in on her frequently. The patient as a history of recurrent UTI followed by Dr. Arita Miss with urology. Her friend states that on Monday the patient was seen shaking and with chills. She believes the patient had a temperature of 105. She complained of urinary symptoms. She refused to go to the hospital or PCP. She started having falls 2 days ago and became more confused. The patient was supposed to get a urine sample to Dr. Arita Miss today, but her confusion was too great. The friend brought her to the ED instead. She denies any other aggravating or alleviating factors.    ED Course: Found to be confused and with a dirty UA. She was started on rocephin. CTH was negative for acute change. TRH was called for admission.   Review of Systems:  Denies CP, dyspnea, palpitations, N/V. Review of systems is otherwise negative for all not mentioned in HPI.   PMHx Past Medical History:  Diagnosis Date  . Anxiety   . Depression   . Hypertension   . Insomnia   . Meningitis   . Recurrent UTI   . Seizures (HCC)   . Substance abuse (HCC)     PSHx Past Surgical History:  Procedure Laterality Date  . BACK SURGERY    . CERVICAL DISC SURGERY    . FRACTURE SURGERY N/A    Phreesia 05/07/2020  . SPINE SURGERY N/A    Phreesia 05/07/2020    SocHx  reports that she has quit smoking. She has never used smokeless tobacco. She reports previous alcohol use. She reports previous drug use.  Allergies  Allergen Reactions  . Ciprofloxacin Shortness Of Breath and Other (See Comments)    Respiratory arrest  . Other Anaphylaxis  . Penicillins Shortness Of Breath and Rash    Did  it involve swelling of the face/tongue/throat, SOB, or low BP? y Did it involve sudden or severe rash/hives, skin peeling, or any reaction on the inside of your mouth or nose? y Did you need to seek medical attention at a hospital or doctor's office? n When did it last happen?1985 If all above answers are "NO", may proceed with cephalosporin use.  Marland Kitchen Oxycodone Other (See Comments)    Patient in recovery process.    FamHx Family History  Problem Relation Age of Onset  . Multiple sclerosis Mother   . Cancer Father     Prior to Admission medications   Medication Sig Start Date End Date Taking? Authorizing Provider  amLODipine (NORVASC) 5 MG tablet Take 1 tablet (5 mg total) by mouth daily. 09/25/18   [place], Alvira Philips, DO  buprenorphine (SUBUTEX) 2 MG SUBL SL tablet Place 2 mg under the tongue every 4 (four) hours.  01/05/20   [provider]  clonazePAM (KLONOPIN) 0.5 MG tablet Take 1 tablet (0.5 mg total) by mouth 3 (three) times daily as needed for anxiety. 02/24/20   Yvette Rack, MD  diphenhydrAMINE (BENADRYL) 25 MG tablet Take 25 mg by mouth at bedtime as needed for sleep.    [provider]  gabapentin (NEURONTIN) 800 MG tablet Take 800 mg by mouth 3 (three) times daily.    [provider]  ibuprofen (  ADVIL,MOTRIN) 100 MG tablet Take 200 mg by mouth 2 (two) times daily as needed for pain.    [provider]  ondansetron (ZOFRAN) 4 MG tablet Take 1 tablet (4 mg total) by mouth every 6 (six) hours as needed for nausea. Patient not taking: Reported on 01/27/2020 09/25/18   [place], Alvira Philips, DO  tiZANidine (ZANAFLEX) 2 MG tablet Take 2-4 mg by mouth 3 (three) times daily. 02/01/20   [provider]    Physical Exam: Vitals:   10/04/20 1335 10/04/20 1434 10/04/20 1500 10/04/20 1530  BP: (!) 142/89 125/67 114/69 121/82  Pulse: (!) 109 (!) 102 100 (!) 103  Resp: 20 18 19 14   Temp: 100.1 F (37.8 C) 100.1 F (37.8 C)    TempSrc: Oral Oral     SpO2: 100% 96% 93% 94%    General: 65 y.o. female resting in bed in NAD Eyes: PERRL, normal sclera ENMT: Nares patent w/o discharge, orophaynx clear, dentition normal, ears w/o discharge/lesions/ulcers Neck: Supple, trachea midline Cardiovascular: RRR, +S1, S2, no m/g/r, equal pulses throughout Respiratory: CTABL, no w/r/r, normal WOB GI: BS+, NDNT, no masses noted, no organomegaly noted MSK: No e/c/c Skin: No rashes, bruises, ulcerations noted Neuro: A&O x 3, no focal deficits Psyc: Flat affect, calm/cooperative, she is able to answer questions appropriately but is still confused  Labs on Admission: I have personally reviewed following labs and imaging studies  CBC: Recent Labs  Lab 10/04/20 1405  WBC 13.0*  NEUTROABS 10.2*  HGB 13.0  HCT 38.7  MCV 93.9  PLT 238   Basic Metabolic Panel: Recent Labs  Lab 10/04/20 1405  NA 131*  K 3.0*  CL 95*  CO2 21*  GLUCOSE 101*  BUN 30*  CREATININE 1.07*  CALCIUM 9.0   GFR: CrCl cannot be calculated (Unknown ideal weight.). Liver Function Tests: Recent Labs  Lab 10/04/20 1405  AST 30  ALT 24  ALKPHOS 109  BILITOT 0.8  PROT 8.8*  ALBUMIN 3.9   No results for input(s): LIPASE, AMYLASE in the last 168 hours. No results for input(s): AMMONIA in the last 168 hours. Coagulation Profile: No results for input(s): INR, PROTIME in the last 168 hours. Cardiac Enzymes: No results for input(s): CKTOTAL, CKMB, CKMBINDEX, TROPONINI in the last 168 hours. BNP (last 3 results) No results for input(s): PROBNP in the last 8760 hours. HbA1C: No results for input(s): HGBA1C in the last 72 hours. CBG: No results for input(s): GLUCAP in the last 168 hours. Lipid Profile: No results for input(s): CHOL, HDL, LDLCALC, TRIG, CHOLHDL, LDLDIRECT in the last 72 hours. Thyroid Function Tests: No results for input(s): TSH, T4TOTAL, FREET4, T3FREE, THYROIDAB in the last 72 hours. Anemia Panel: No results for input(s): VITAMINB12, FOLATE,  FERRITIN, TIBC, IRON, RETICCTPCT in the last 72 hours. Urine analysis:    Component Value Date/Time   COLORURINE YELLOW 10/04/2020 1358   APPEARANCEUR HAZY (A) 10/04/2020 1358   LABSPEC 1.017 10/04/2020 1358   PHURINE 5.0 10/04/2020 1358   GLUCOSEU NEGATIVE 10/04/2020 1358   HGBUR MODERATE (A) 10/04/2020 1358   BILIRUBINUR NEGATIVE 10/04/2020 1358   KETONESUR 20 (A) 10/04/2020 1358   PROTEINUR 100 (A) 10/04/2020 1358   NITRITE NEGATIVE 10/04/2020 1358   LEUKOCYTESUR LARGE (A) 10/04/2020 1358    Radiological Exams on Admission: CT Head Wo Contrast  Result Date: 10/04/2020 CLINICAL DATA:  Delirium EXAM: CT HEAD WITHOUT CONTRAST TECHNIQUE: Contiguous axial images were obtained from the base of the skull through the vertex without intravenous contrast.  COMPARISON:  CT head 02/24/2020 FINDINGS: Brain: No evidence of acute infarction, hemorrhage, hydrocephalus, extra-axial collection or mass lesion/mass effect. Vascular: Negative for hyperdense vessel Skull: Negative Sinuses/Orbits: Paranasal sinuses clear.  Negative orbit Other: None IMPRESSION: Negative CT head Electronically Signed   By: Marlan Palau M.D.   On: 10/04/2020 14:55   DG Chest Portable 1 View  Result Date: 10/04/2020 CLINICAL DATA:  Altered mental status EXAM: PORTABLE CHEST 1 VIEW COMPARISON:  02/23/2020 FINDINGS: Heart size upper normal. Vascularity normal. Negative for heart failure. Slight bibasilar atelectasis. Negative for pneumonia or effusion IMPRESSION: Slight bibasilar atelectasis. Electronically Signed   By: Marlan Palau M.D.   On: 10/04/2020 14:58    Assessment/Plan UTI     - place in obs, med-surg     - Rocephin, IVF     - follow UCx, Bld Cx     - she does not normally I&O cath, but has been the last several days; apparently to avoid going to the urologist; follow I&O for now; if showing signs of retention, check bladder scan  Acute metabolic encephalopathy Falls     - likely secondary to above     - CTH  is negative     - treat UTI     - will have PT assess as well  HTN     - continue home regimen when med recomplete  AKI     - fluids, renal US  Hypokalemia Hyponatremia     - replace K+, check Mg2+     - fluids  DVT prophylaxis: lovenox  Code Status: FULL  Family Communication: Spoke with friend Gary Fleet) by phone  Consults called: Palliative Care Status is: Observation  The patient remains OBS appropriate and will d/c before 2 midnights.  Dispo: The patient is from: Home              Anticipated d/c is to: Home              Patient currently is not medically stable to d/c.   Difficult to place patient No  Teddy Spike DO [facility]  If 7PM-7AM, please contact night-coverage [email or web address]  10/04/2020, 3:57 PM

## 2020-10-05 ENCOUNTER — Observation Stay (HOSPITAL_COMMUNITY): Payer: Medicare Other

## 2020-10-05 DIAGNOSIS — M25511 Pain in right shoulder: Secondary | ICD-10-CM | POA: Diagnosis not present

## 2020-10-05 DIAGNOSIS — F419 Anxiety disorder, unspecified: Secondary | ICD-10-CM

## 2020-10-05 DIAGNOSIS — F32A Depression, unspecified: Secondary | ICD-10-CM

## 2020-10-05 DIAGNOSIS — N179 Acute kidney failure, unspecified: Secondary | ICD-10-CM

## 2020-10-05 DIAGNOSIS — W19XXXD Unspecified fall, subsequent encounter: Secondary | ICD-10-CM

## 2020-10-05 DIAGNOSIS — W19XXXA Unspecified fall, initial encounter: Secondary | ICD-10-CM

## 2020-10-05 DIAGNOSIS — G894 Chronic pain syndrome: Secondary | ICD-10-CM

## 2020-10-05 DIAGNOSIS — N390 Urinary tract infection, site not specified: Secondary | ICD-10-CM | POA: Diagnosis not present

## 2020-10-05 DIAGNOSIS — E876 Hypokalemia: Secondary | ICD-10-CM

## 2020-10-05 DIAGNOSIS — G9341 Metabolic encephalopathy: Secondary | ICD-10-CM | POA: Diagnosis not present

## 2020-10-05 LAB — COMPREHENSIVE METABOLIC PANEL
ALT: 20 U/L (ref 0–44)
AST: 23 U/L (ref 15–41)
Albumin: 3.5 g/dL (ref 3.5–5.0)
Alkaline Phosphatase: 98 U/L (ref 38–126)
Anion gap: 10 (ref 5–15)
BUN: 24 mg/dL — ABNORMAL HIGH (ref 8–23)
CO2: 23 mmol/L (ref 22–32)
Calcium: 8.9 mg/dL (ref 8.9–10.3)
Chloride: 100 mmol/L (ref 98–111)
Creatinine, Ser: 0.78 mg/dL (ref 0.44–1.00)
GFR, Estimated: >60 mL/min (ref >60)
Glucose, Bld: 128 mg/dL — ABNORMAL HIGH (ref 70–99)
Potassium: 3.6 mmol/L (ref 3.5–5.1)
Sodium: 133 mmol/L — ABNORMAL LOW (ref 135–145)
Total Bilirubin: 0.6 mg/dL (ref 0.3–1.2)
Total Protein: 7.6 g/dL (ref 6.5–8.1)

## 2020-10-05 LAB — CBC
HCT: 36.3 % (ref 36.0–46.0)
Hemoglobin: 11.7 g/dL — ABNORMAL LOW (ref 12.0–15.0)
MCH: 31.2 pg (ref 26.0–34.0)
MCHC: 32.2 g/dL (ref 30.0–36.0)
MCV: 96.8 fL (ref 80.0–100.0)
Platelets: 207 10*3/uL (ref 150–400)
RBC: 3.75 MIL/uL — ABNORMAL LOW (ref 3.87–5.11)
RDW: 12.9 % (ref 11.5–15.5)
WBC: 10.3 10*3/uL (ref 4.0–10.5)
nRBC: 0 % (ref 0.0–0.2)

## 2020-10-05 IMAGING — DX DG SHOULDER 2+V*R*
2 series · 2 of 2 positions shown · non-contrast
Comparison: None.

CLINICAL DATA: Recent falls with right shoulder pain, initial
encounter

EXAM:
RIGHT SHOULDER - 2+ VIEW

[shoulder grashey]
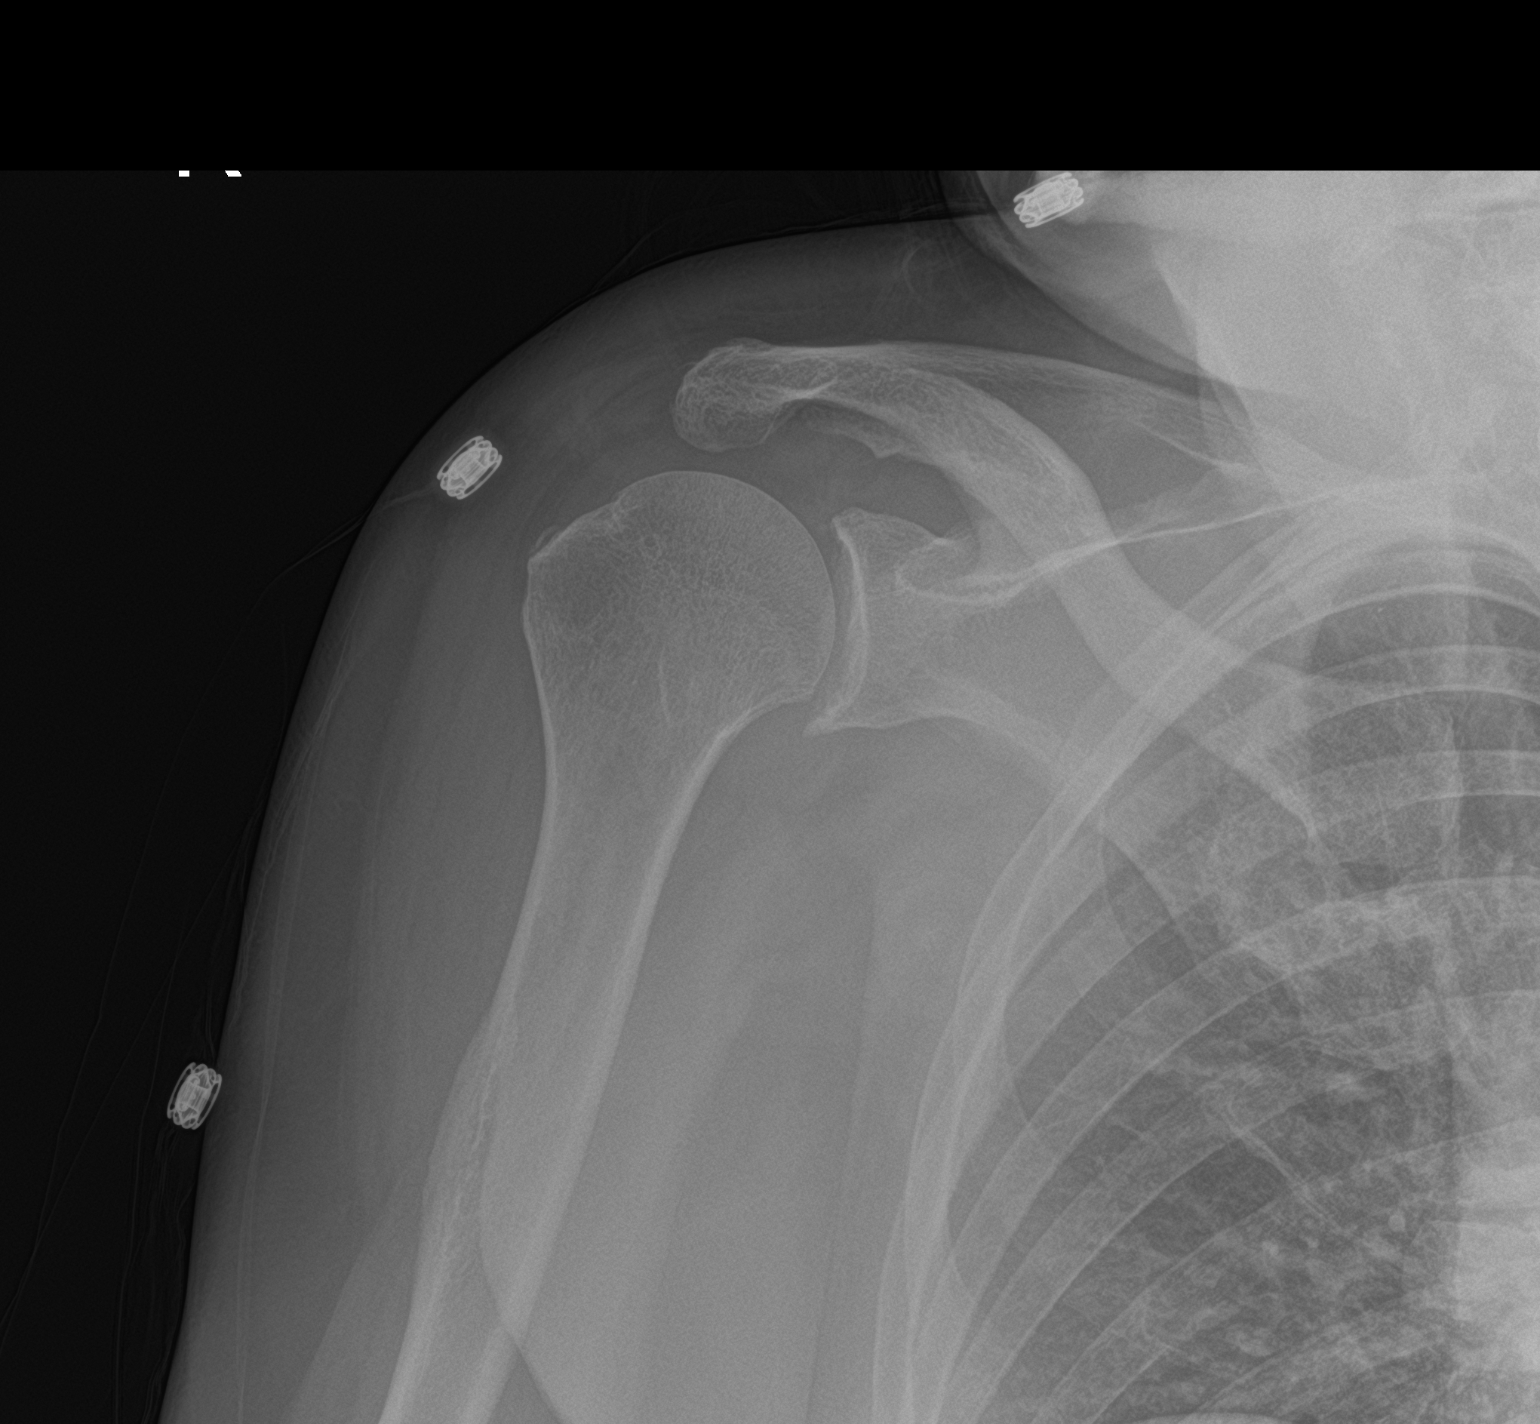

[shoulder y view]
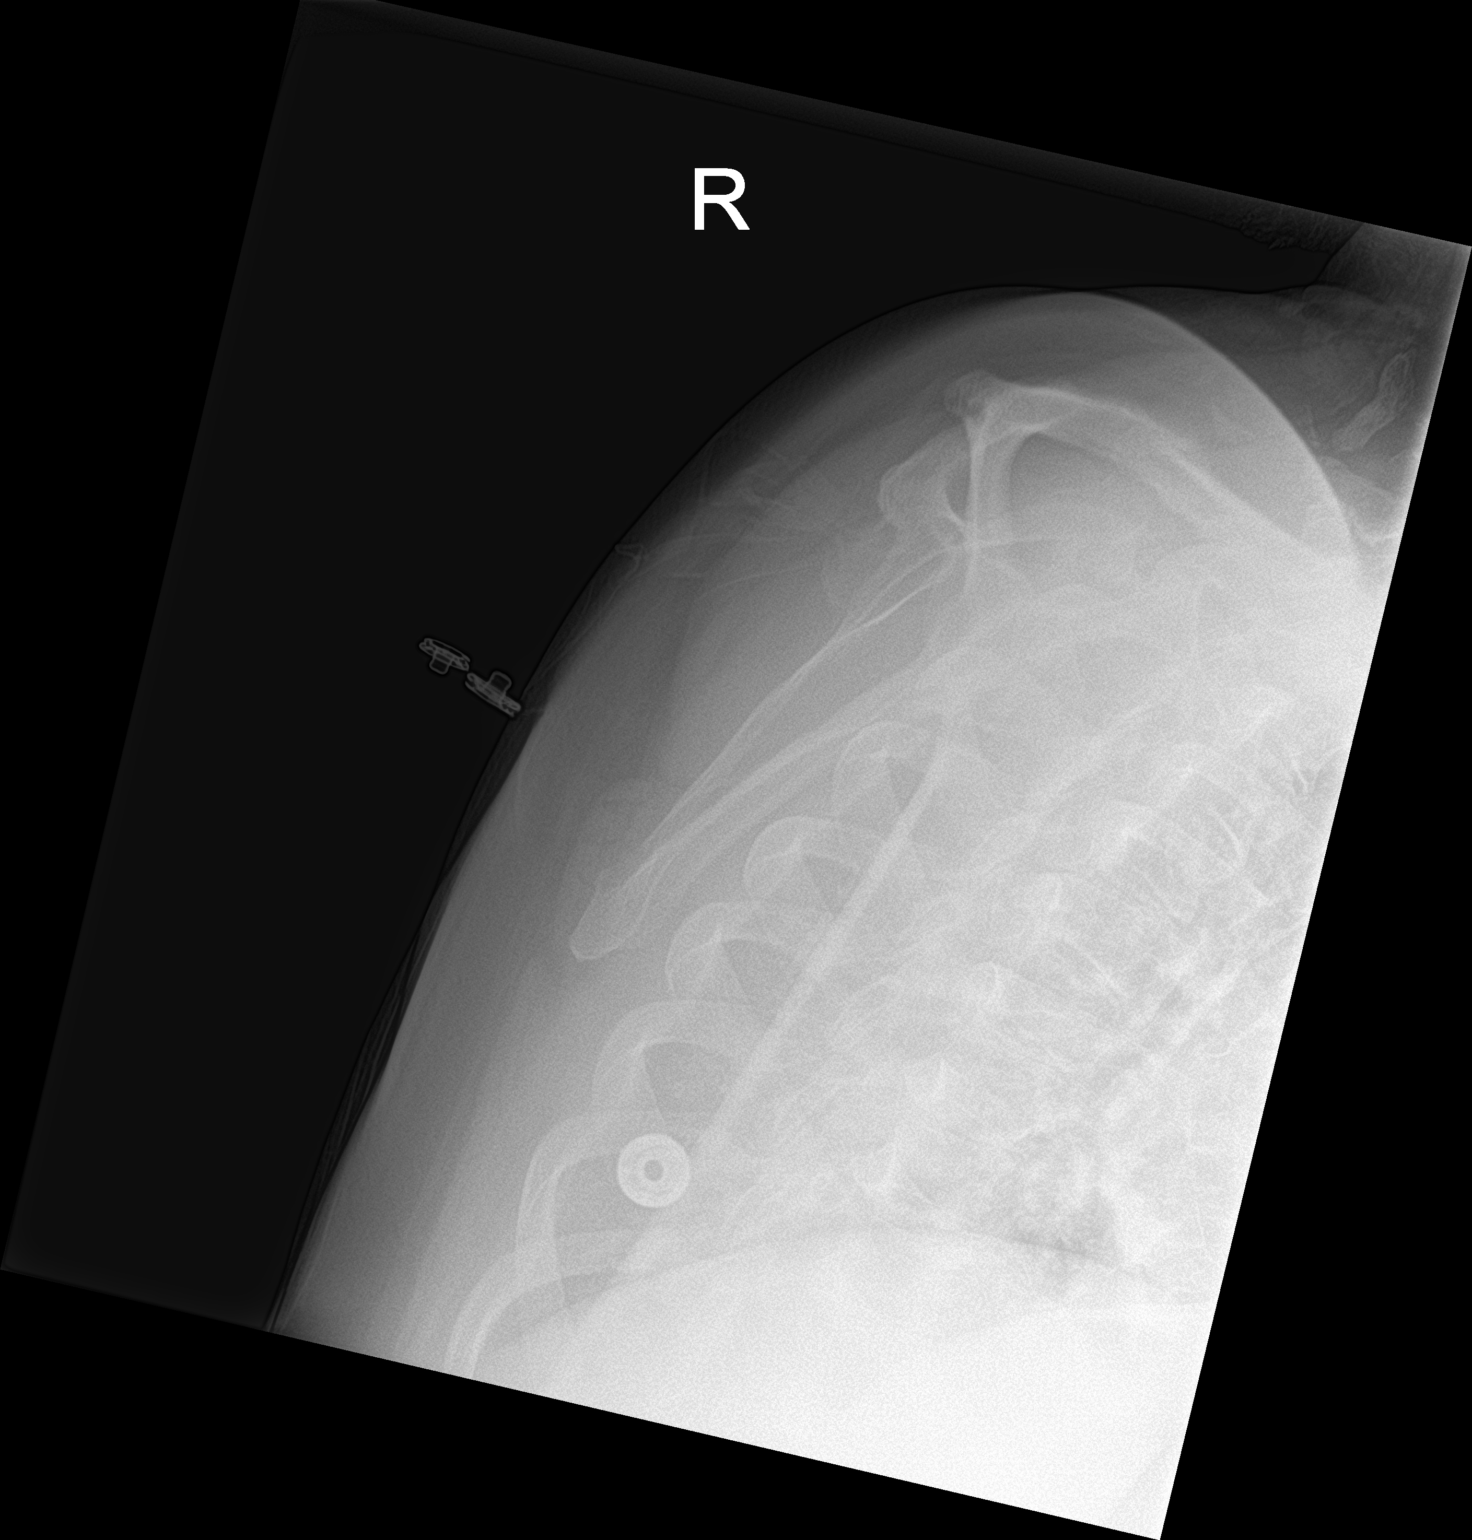

[2 of 2 positions shown; findings below may reference images not displayed]

FINDINGS: Mild degenerative changes of the glenohumeral joint are seen. No
acute fracture or dislocation is noted. No soft tissue abnormality
is seen. Underlying bony thorax is within normal limits.
IMPRESSION: Mild degenerative change without acute abnormality.

## 2020-10-05 MED ORDER — DULOXETINE HCL 60 MG PO CPEP
60.0000 mg | ORAL_CAPSULE | Freq: Every day | ORAL | Status: DC
  Administered 2020-10-05 – 2020-10-07 (×3): 60 mg via ORAL
  Filled 2020-10-05 (×3): qty 1

## 2020-10-05 MED ORDER — BUPRENORPHINE HCL-NALOXONE HCL 2-0.5 MG SL SUBL
1.0000 | SUBLINGUAL_TABLET | Freq: Three times a day (TID) | SUBLINGUAL | Status: DC
  Administered 2020-10-05 – 2020-10-07 (×7): 1 via SUBLINGUAL
  Filled 2020-10-05 (×7): qty 1

## 2020-10-05 MED ORDER — IBUPROFEN 200 MG PO TABS
400.0000 mg | ORAL_TABLET | ORAL | Status: DC | PRN
  Administered 2020-10-05: 400 mg via ORAL
  Filled 2020-10-05: qty 2

## 2020-10-05 MED ORDER — ACETAMINOPHEN 650 MG RE SUPP: 650.0000 mg | RECTAL | Status: DC | PRN

## 2020-10-05 MED ORDER — TIZANIDINE HCL 4 MG PO TABS
4.0000 mg | ORAL_TABLET | Freq: Three times a day (TID) | ORAL | Status: DC
  Administered 2020-10-05 – 2020-10-07 (×8): 4 mg via ORAL
  Filled 2020-10-05 (×8): qty 1

## 2020-10-05 MED ORDER — CLONAZEPAM 0.5 MG PO TABS
0.5000 mg | ORAL_TABLET | Freq: Three times a day (TID) | ORAL | Status: DC | PRN
Start: 2020-10-05 — End: 2020-10-07
  Administered 2020-10-05 – 2020-10-07 (×4): 0.5 mg via ORAL
  Filled 2020-10-05 (×4): qty 1

## 2020-10-05 MED ORDER — ACETAMINOPHEN 325 MG PO TABS
650.0000 mg | ORAL_TABLET | ORAL | Status: DC | PRN
  Administered 2020-10-06: 650 mg via ORAL
  Filled 2020-10-05 (×2): qty 2

## 2020-10-05 MED ORDER — GABAPENTIN 400 MG PO CAPS
800.0000 mg | ORAL_CAPSULE | Freq: Three times a day (TID) | ORAL | Status: DC
  Administered 2020-10-05 – 2020-10-07 (×7): 800 mg via ORAL
  Filled 2020-10-05 (×7): qty 2

## 2020-10-05 NOTE — Progress Notes (Signed)
PROGRESS NOTE    Stephanie Carrillo  DGU:[id number] DOB: 1955-11-25 DOA: 10/04/2020 PCP: Carmel Sacramento, NP   Chief Complaint  Patient presents with  . Possible UTI  . Altered Mental Status    Brief Narrative:  Patient 65 year old female history of hypertension, recurrent UTIs, depression/anxiety, chronic pain presenting with confusion, fever, shaking chills, increasing falls and worsening confusions 2 days prior to admission.  It was noted that patient was to get a urine sample per urology on day of admission however due to worsening confusion was brought to the ED. Patient stated has been doing in and out caths per urology recommendations due to urinary retention. On presentation urinalysis done concerning for UTI.  Head CT done negative.  Patient placed empirically on IV antibiotics, IV fluids, supportive care.   Assessment & Plan:   Principal Problem:   UTI (urinary tract infection) Active Problems:   Anxiety and depression   Chronic pain syndrome   Hypokalemia   Acute metabolic encephalopathy  1 recurrent UTI Patient presented with confusion, shaking chills, fever at home with a temp as high as 105 per patient, was undergoing ongoing I&O caths for urinary retention.  Urinalysis concerning for UTI.  Patient pancultured results pending.  Improving clinically.  Continue empiric IV Rocephin.  IV fluids.  Supportive care.  Follow.  2.  Acute metabolic encephalopathy Likely secondary to problem #1.  Head CT negative for any acute abnormalities.  Patient pancultured results pending.  Clinical improvement.  Continue IV fluids, IV Rocephin.  Follow.  3.  Falls Likely secondary to recurrent UTIs and acute infection.  PT/OT.  4.  Hypertension Blood pressure borderline.  Hold home regimen of antihypertensive medications.  Continue IV fluids.  Follow.  5.  Depression/anxiety Resume home regimen Cymbalta and Klonopin as needed.  6.  Chronic pain Resume home regimen Neurontin, Zanaflex.   Hold ibuprofen for now.  Placed back on decreased dose of home regimen of Subutex.  7.  Acute kidney injury Secondary to prerenal azotemia in the setting of UTI.  Renal function improved with hydration.  Follow.  8.  Hypokalemia Repleted.  Magnesium of 1.9.  Follow.   DVT prophylaxis: Lovenox Code Status: DNR Family Communication: Updated patient.  No family at bedside. Disposition:   Status is: Observation    Dispo: The patient is from: Home              Anticipated d/c is to: Home              Patient currently on IV antibiotics, urine cultures pending, not stable for discharge.   Difficult to place patient no       Consultants:   Palliative care pending  Procedures:   Chest x-ray 10/04/2020  Plain films of the right shoulder pending 10/05/2020  CT head 10/04/2020  Renal ultrasound 10/04/2020  Antimicrobials:   IV Rocephin 10/04/2020>>>>   Subjective: Patient laying in bed.  Alert and oriented to self place and time.  Denies any chest pain or shortness of breath.  Denies any abdominal pain.  Still with some weakness.  States overall confusion has improved.  Complaining of right shoulder pain.  Patient stated fell multiple times on the hardwood floor.  Objective: Vitals:   10/04/20 2335 10/05/20 0025 10/05/20 0519 10/05/20 1353  BP: 98/63 117/72 121/72 117/62  Pulse: 67 70 65 72  Resp:  16 14 16   Temp:  (!) 97.5 F (36.4 C) 98.2 F (36.8 C) 98.2 F (36.8 C)  TempSrc:  Oral  Oral Oral  SpO2:  94% 95% 97%  Weight:      Height:        Intake/Output Summary (Last 24 hours) at 10/05/2020 1513 Last data filed at 10/05/2020 0641 Gross per 24 hour  Intake 2009.44 ml  Output 425 ml  Net 1584.44 ml   Filed Weights   10/04/20 1659  Weight: 89.8 kg    Examination:  General exam: Appears calm and comfortable  Respiratory system: Clear to auscultation. Respiratory effort normal. Cardiovascular system: S1 & S2 heard, RRR. No JVD, murmurs, rubs, gallops or clicks.  No pedal edema. Gastrointestinal system: Abdomen is nondistended, soft and nontender. No organomegaly or masses felt. Normal bowel sounds heard. Central nervous system: Alert and oriented. No focal neurological deficits. Extremities: Symmetric 5 x 5 power. Skin: No rashes, lesions or ulcers Psychiatry: Judgement and insight appear normal. Mood & affect appropriate.     Data Reviewed: I have personally reviewed following labs and imaging studies  CBC: Recent Labs  Lab 10/04/20 1405 10/05/20 0453  WBC 13.0* 10.3  NEUTROABS 10.2*  --   HGB 13.0 11.7*  HCT 38.7 36.3  MCV 93.9 96.8  PLT 238 207    Basic Metabolic Panel: Recent Labs  Lab 10/04/20 1405 10/04/20 1701 10/05/20 0453  NA 131*  --  133*  K 3.0*  --  3.6  CL 95*  --  100  CO2 21*  --  23  GLUCOSE 101*  --  128*  BUN 30*  --  24*  CREATININE 1.07*  --  0.78  CALCIUM 9.0  --  8.9  MG  --  1.9  --     GFR: Estimated Creatinine Clearance: 79.1 mL/min (by C-G formula based on SCr of 0.78 mg/dL).  Liver Function Tests: Recent Labs  Lab 10/04/20 1405 10/05/20 0453  AST 30 23  ALT 24 20  ALKPHOS 109 98  BILITOT 0.8 0.6  PROT 8.8* 7.6  ALBUMIN 3.9 3.5    CBG: No results for input(s): GLUCAP in the last 168 hours.   Recent Results (from the past 240 hour(s))  Culture, blood (routine x 2)     Status: None (Preliminary result)   Collection Time: 10/04/20  2:05 PM   Specimen: BLOOD  Result Value Ref Range Status   Specimen Description   Final    BLOOD RIGHT ANTECUBITAL Performed at [facility], [address] [place], [place] [address]    Special Requests   Final    BOTTLES DRAWN AEROBIC AND ANAEROBIC Blood Culture adequate volume Performed at [facility], [address] [place], [place] [address]    Culture   Final    NO GROWTH < 24 HOURS Performed at [facility], [address] [place], [place] [address]    Report Status PENDING  Incomplete   Culture, blood (routine x 2)     Status: None (Preliminary result)   Collection Time: 10/04/20  3:19 PM   Specimen: BLOOD LEFT HAND  Result Value Ref Range Status   Specimen Description   Final    BLOOD LEFT HAND Performed at [facility], [address], [place], [place] [address]    Special Requests   Final    BOTTLES DRAWN AEROBIC ONLY Blood Culture results may not be optimal due to an inadequate volume of blood received in culture bottles Performed at [facility], [address], [place], [place] [address]    Culture  Final    NO GROWTH < 24 HOURS Performed at [facility], [address], [place], [place] [address]    Report Status PENDING  Incomplete         Radiology Studies: DG Shoulder Right  Result Date: 10/05/2020 CLINICAL DATA:  Recent falls with right shoulder pain, initial encounter EXAM: RIGHT SHOULDER - 2+ VIEW COMPARISON:  None. FINDINGS: Mild degenerative changes of the glenohumeral joint are seen. No acute fracture or dislocation is noted. No soft tissue abnormality is seen. Underlying bony thorax is within normal limits. IMPRESSION: Mild degenerative change without acute abnormality. Electronically Signed   By: Alcide Clever M.D.   On: 10/05/2020 12:43   CT Head Wo Contrast  Result Date: 10/04/2020 CLINICAL DATA:  Delirium EXAM: CT HEAD WITHOUT CONTRAST TECHNIQUE: Contiguous axial images were obtained from the base of the skull through the vertex without intravenous contrast. COMPARISON:  CT head 02/24/2020 FINDINGS: Brain: No evidence of acute infarction, hemorrhage, hydrocephalus, extra-axial collection or mass lesion/mass effect. Vascular: Negative for hyperdense vessel Skull: Negative Sinuses/Orbits: Paranasal sinuses clear.  Negative orbit Other: None IMPRESSION: Negative CT head Electronically Signed   By: Marlan Palau M.D.   On: 10/04/2020 14:55   US RENAL  Result Date: 10/04/2020 CLINICAL DATA:  Acute renal  insufficiency EXAM: RENAL / URINARY TRACT ULTRASOUND COMPLETE COMPARISON:  None. FINDINGS: Right Kidney: Renal measurements: 12.0 x 4.9 by 5.3 cm = volume: 164.8 mL. Echogenicity within normal limits. No mass or hydronephrosis visualized. Left Kidney: Renal measurements: 12.2 x 5.4 x 4.9 cm = volume: 169.3 mL. Echogenicity within normal limits. No mass or hydronephrosis visualized. Bladder: Bladder is decompressed, and cannot be evaluated. Other: None. IMPRESSION: 1. Unremarkable renal ultrasound. 2. Decompressed bladder, limiting evaluation. Electronically Signed   By: Sharlet Salina M.D.   On: 10/04/2020 17:55   DG Chest Portable 1 View  Result Date: 10/04/2020 CLINICAL DATA:  Altered mental status EXAM: PORTABLE CHEST 1 VIEW COMPARISON:  02/23/2020 FINDINGS: Heart size upper normal. Vascularity normal. Negative for heart failure. Slight bibasilar atelectasis. Negative for pneumonia or effusion IMPRESSION: Slight bibasilar atelectasis. Electronically Signed   By: Marlan Palau M.D.   On: 10/04/2020 14:58        Scheduled Meds: . buprenorphine-naloxone  1 tablet Sublingual Q8H  . DULoxetine  60 mg Oral Daily  . enoxaparin (LOVENOX) injection  40 mg Subcutaneous Q24H  . gabapentin  800 mg Oral TID  . tiZANidine  4 mg Oral TID   Continuous Infusions: . sodium chloride 125 mL/hr at 10/05/20 1214  . cefTRIAXone (ROCEPHIN)  IV 1 g (10/05/20 1359)     LOS: 0 days    Time spent: 40 minutes    Ramiro Harvest, MD [facility]   To contact the attending provider between 7A-7P or the covering provider during after hours 7P-7A, please log into the web site [email or web address] and access using universal [place] password for that web site. If you do not have the password, please call the hospital operator.  10/05/2020, 3:13 PM

## 2020-10-05 NOTE — Progress Notes (Signed)
   10/05/20 2121  Assess: MEWS Score  Temp (!) 102.2 F (39 C)  BP (!) 153/76  Pulse Rate 84  Resp 16  SpO2 91 %  O2 Device Room Air  Assess: MEWS Score  MEWS Temp 2  MEWS Systolic 0  MEWS Pulse 0  MEWS RR 0  MEWS LOC 0  MEWS Score 2  MEWS Score Color Yellow  Assess: if the MEWS score is Yellow or Red  Were vital signs taken at a resting state? Yes  Focused Assessment No change from prior assessment  Early Detection of Sepsis Score *See Row Information* Medium  MEWS guidelines implemented *See Row Information* Yes  Treat  MEWS Interventions Administered prn meds/treatments  Pain Scale 0-10  Pain Score 5  Pain Type Acute pain;Chronic pain  Pain Location Generalized  Escalate  MEWS: Escalate Yellow: discuss with charge nurse/RN and consider discussing with provider and RRT  Notify: Charge Nurse/RN  Name of Charge Nurse/RN Notified [place], RN  Date Charge Nurse/RN Notified 10/05/20  Time Charge Nurse/RN Notified 2230  Notify: Provider  Provider Name/Title Valente David MD  Date Provider Notified 10/05/20  Time Provider Notified 2130  Notification Type Page  Notification Reason  (prn meds for fever, tyelonol not due at this time)  Provider response See new orders

## 2020-10-05 NOTE — Care Management Obs Status (Signed)
MEDICARE OBSERVATION STATUS NOTIFICATION   Patient Details  Name: Stephanie Carrillo MRN: [id number] Date of Birth: 1956-03-01   Medicare Observation Status Notification Given:  Yes    Elliot Gault, LCSW 10/05/2020, 2:22 PM

## 2020-10-05 NOTE — Progress Notes (Signed)
PT Cancellation Note  Patient Details Name: Stephanie Carrillo MRN: [id number] DOB: 05-Sep-1955   Cancelled Treatment:    Reason Eval/Treat Not Completed: Other (comment). Patient just got her lunch. Per Rn, patient now independnent. Will check back in AM.   Rada Hay 10/05/2020, 2:16 PM Blanchard Kelch PT Acute Rehabilitation Services Pager [phone] Office [phone]

## 2020-10-06 DIAGNOSIS — N390 Urinary tract infection, site not specified: Secondary | ICD-10-CM | POA: Diagnosis present

## 2020-10-06 DIAGNOSIS — I10 Essential (primary) hypertension: Secondary | ICD-10-CM | POA: Diagnosis present

## 2020-10-06 DIAGNOSIS — E876 Hypokalemia: Secondary | ICD-10-CM | POA: Diagnosis present

## 2020-10-06 DIAGNOSIS — Z885 Allergy status to narcotic agent status: Secondary | ICD-10-CM | POA: Diagnosis not present

## 2020-10-06 DIAGNOSIS — Z20822 Contact with and (suspected) exposure to covid-19: Secondary | ICD-10-CM | POA: Diagnosis present

## 2020-10-06 DIAGNOSIS — Z880 Allergy status to penicillin: Secondary | ICD-10-CM | POA: Diagnosis not present

## 2020-10-06 DIAGNOSIS — M25511 Pain in right shoulder: Secondary | ICD-10-CM | POA: Diagnosis present

## 2020-10-06 DIAGNOSIS — Z881 Allergy status to other antibiotic agents status: Secondary | ICD-10-CM | POA: Diagnosis not present

## 2020-10-06 DIAGNOSIS — Z87440 Personal history of urinary (tract) infections: Secondary | ICD-10-CM | POA: Diagnosis not present

## 2020-10-06 DIAGNOSIS — N3001 Acute cystitis with hematuria: Secondary | ICD-10-CM

## 2020-10-06 DIAGNOSIS — Z515 Encounter for palliative care: Secondary | ICD-10-CM | POA: Diagnosis not present

## 2020-10-06 DIAGNOSIS — G4700 Insomnia, unspecified: Secondary | ICD-10-CM | POA: Diagnosis present

## 2020-10-06 DIAGNOSIS — R296 Repeated falls: Secondary | ICD-10-CM | POA: Diagnosis present

## 2020-10-06 DIAGNOSIS — B9620 Unspecified Escherichia coli [E. coli] as the cause of diseases classified elsewhere: Secondary | ICD-10-CM | POA: Diagnosis present

## 2020-10-06 DIAGNOSIS — E871 Hypo-osmolality and hyponatremia: Secondary | ICD-10-CM | POA: Diagnosis present

## 2020-10-06 DIAGNOSIS — N179 Acute kidney failure, unspecified: Secondary | ICD-10-CM | POA: Diagnosis present

## 2020-10-06 DIAGNOSIS — Z66 Do not resuscitate: Secondary | ICD-10-CM | POA: Diagnosis present

## 2020-10-06 DIAGNOSIS — G9341 Metabolic encephalopathy: Secondary | ICD-10-CM | POA: Diagnosis present

## 2020-10-06 DIAGNOSIS — G894 Chronic pain syndrome: Secondary | ICD-10-CM | POA: Diagnosis present

## 2020-10-06 DIAGNOSIS — F32A Depression, unspecified: Secondary | ICD-10-CM | POA: Diagnosis present

## 2020-10-06 DIAGNOSIS — Z7189 Other specified counseling: Secondary | ICD-10-CM

## 2020-10-06 DIAGNOSIS — Z79899 Other long term (current) drug therapy: Secondary | ICD-10-CM | POA: Diagnosis not present

## 2020-10-06 DIAGNOSIS — Z87891 Personal history of nicotine dependence: Secondary | ICD-10-CM | POA: Diagnosis not present

## 2020-10-06 DIAGNOSIS — F419 Anxiety disorder, unspecified: Secondary | ICD-10-CM | POA: Diagnosis present

## 2020-10-06 LAB — CBC WITH DIFFERENTIAL/PLATELET
Abs Immature Granulocytes: 0.07 10*3/uL (ref 0.00–0.07)
Basophils Absolute: 0.1 10*3/uL (ref 0.0–0.1)
Basophils Relative: 1 %
Eosinophils Absolute: 0 10*3/uL (ref 0.0–0.5)
Eosinophils Relative: 0 %
HCT: 34.4 % — ABNORMAL LOW (ref 36.0–46.0)
Hemoglobin: 11.3 g/dL — ABNORMAL LOW (ref 12.0–15.0)
Immature Granulocytes: 1 %
Lymphocytes Relative: 11 %
Lymphs Abs: 0.8 10*3/uL (ref 0.7–4.0)
MCH: 31.5 pg (ref 26.0–34.0)
MCHC: 32.8 g/dL (ref 30.0–36.0)
MCV: 95.8 fL (ref 80.0–100.0)
Monocytes Absolute: 1.4 10*3/uL — ABNORMAL HIGH (ref 0.1–1.0)
Monocytes Relative: 19 %
Neutro Abs: 5.1 10*3/uL (ref 1.7–7.7)
Neutrophils Relative %: 68 %
Platelets: 225 10*3/uL (ref 150–400)
RBC: 3.59 MIL/uL — ABNORMAL LOW (ref 3.87–5.11)
RDW: 12.8 % (ref 11.5–15.5)
WBC: 7.5 10*3/uL (ref 4.0–10.5)
nRBC: 0 % (ref 0.0–0.2)

## 2020-10-06 LAB — BASIC METABOLIC PANEL
Anion gap: 11 (ref 5–15)
BUN: 10 mg/dL (ref 8–23)
CO2: 26 mmol/L (ref 22–32)
Calcium: 8.5 mg/dL — ABNORMAL LOW (ref 8.9–10.3)
Chloride: 98 mmol/L (ref 98–111)
Creatinine, Ser: 0.58 mg/dL (ref 0.44–1.00)
GFR, Estimated: >60 mL/min (ref >60)
Glucose, Bld: 115 mg/dL — ABNORMAL HIGH (ref 70–99)
Potassium: 2.9 mmol/L — ABNORMAL LOW (ref 3.5–5.1)
Sodium: 135 mmol/L (ref 135–145)

## 2020-10-06 LAB — MAGNESIUM: Magnesium: 1.4 mg/dL — ABNORMAL LOW (ref 1.7–2.4)

## 2020-10-06 MED ORDER — MAGNESIUM SULFATE 4 GM/100ML IV SOLN
4.0000 g | Freq: Once | INTRAVENOUS | Status: AC
  Administered 2020-10-06: 4 g via INTRAVENOUS
  Filled 2020-10-06: qty 100

## 2020-10-06 MED ORDER — POTASSIUM CHLORIDE CRYS ER 20 MEQ PO TBCR
40.0000 meq | EXTENDED_RELEASE_TABLET | ORAL | Status: AC
Start: 2020-10-06 — End: 2020-10-06
  Administered 2020-10-06 (×2): 40 meq via ORAL
  Filled 2020-10-06 (×2): qty 2

## 2020-10-06 NOTE — Progress Notes (Signed)
PROGRESS NOTE    Stephanie Carrillo  IRC:[id number] DOB: Jan 16, 1956 DOA: 10/04/2020 PCP: Carmel Sacramento, NP   Chief Complaint  Patient presents with   Possible UTI   Altered Mental Status    Brief Narrative:  Patient 66 year old female history of hypertension, recurrent UTIs, depression/anxiety, chronic pain presenting with confusion, fever, shaking chills, increasing falls and worsening confusions 2 days prior to admission.  It was noted that patient was to get a urine sample per urology on day of admission however due to worsening confusion was brought to the ED. Patient stated has been doing in and out caths per urology recommendations due to urinary retention. On presentation urinalysis done concerning for UTI.  Head CT done negative.  Patient placed empirically on IV antibiotics, IV fluids, supportive care.   Assessment & Plan:   Principal Problem:   UTI (urinary tract infection) Active Problems:   Anxiety and depression   Chronic pain syndrome   Hypokalemia   Acute metabolic encephalopathy   AKI (acute kidney injury) (HCC)   Fall  1 recurrent E. coli UTI Patient presented with confusion, shaking chills, fever at home with a temp as high as 105 per patient, was undergoing ongoing I&O caths for urinary retention.  T-max 102.5 2121 hrs.(10/05/2020).  Patient pancultured.  Urine cultures with > 100,000 colonies of E. coli, sensitivities pending.  Continue empiric IV Rocephin.  Supportive care.  Follow.    2.  Acute metabolic encephalopathy Likely secondary to problem #1.  Clinically improved.  CT head negative.  Patient pancultured results pending.  Continue IV antibiotics.  Supportive care.  Follow.   3.  Falls Likely secondary to recurrent UTIs and acute infection.  PT/OT.  4.  Hypertension Blood pressure borderline.  Continue to hold home antihypertensive medications.  Follow.   5.  Depression/anxiety Stable.  Continue home regimen Cymbalta, Klonopin as needed.   6.   Chronic pain Continue home regimen Neurontin, Zanaflex.  Continue to hold ibuprofen.  Continue Suboxone and increase back to home dose in the next 24 to 48 hours.  7.  Acute kidney injury Secondary to prerenal azotemia in the setting of UTI.  Renal function improved with hydration.  Saline lock IV fluids.   8.  Hypokalemia/hypomagnesemia K. Dur 40 mEq p.o. every 4 hours x2 doses.  Magnesium sulfate 4 g IV x1.  Repeat labs in the morning.    DVT prophylaxis: Lovenox Code Status: DNR Family Communication: Updated patient.  No family at bedside. Disposition:   Status is: Observation    Dispo: The patient is from: Home              Anticipated d/c is to: Home              Patient currently on IV antibiotics, still with fevers as high as 102.2, not stable for discharge.     Difficult to place patient no       Consultants:   Palliative care: Dr. Linna Darner 10/06/2020  Procedures:   Chest x-ray 10/04/2020  Plain films of the right shoulder pending 10/05/2020  CT head 10/04/2020  Renal ultrasound 10/04/2020  Antimicrobials:   IV Rocephin 10/04/2020>>>>   Subjective: Patient laying in bed.  Overall feeling better.  Denies any chest pain or shortness of breath.  Alert and oriented to self place and time.  Tolerating current diet.  T-max of 102.2 last night at 2121 hrs.  Objective: Vitals:   10/05/20 1353 10/05/20 2121 10/05/20 2253 10/06/20 0548  BP: 117/62 Marland Kitchen)  153/76  116/74  Pulse: 72 84  65  Resp: 16 16  14   Temp: 98.2 F (36.8 C) (!) 102.2 F (39 C) 99.7 F (37.6 C) 97.7 F (36.5 C)  TempSrc: Oral Oral Oral Oral  SpO2: 97% 91%  97%  Weight:    91.2 kg  Height:        Intake/Output Summary (Last 24 hours) at 10/06/2020 1156 Last data filed at 10/06/2020 0924 Gross per 24 hour  Intake 4156.59 ml  Output 2350 ml  Net 1806.59 ml   Filed Weights   10/04/20 1659 10/06/20 0548  Weight: 89.8 kg 91.2 kg    Examination:  General exam: : NAD Respiratory system: CTA B  anterior lung fields.  No wheezes, no rhonchi.  Speaking in full sentences.  Normal respiratory effort. Cardiovascular system: Regular rate and rhythm no murmurs rubs or gallops.  No JVD.  No lower extremity edema.  Gastrointestinal system: Abdomen soft, nontender, nondistended, positive bowel sounds.  No rebound.  No guarding. Central nervous system: Alert and oriented. No focal neurological deficits. Extremities: Symmetric 5 x 5 power. Skin: No rashes, lesions or ulcers Psychiatry: Judgement and insight appear normal. Mood & affect appropriate.  Data Reviewed: I have personally reviewed following labs and imaging studies  CBC: Recent Labs  Lab 10/04/20 1405 10/05/20 0453 10/06/20 0452  WBC 13.0* 10.3 7.5  NEUTROABS 10.2*  --  5.1  HGB 13.0 11.7* 11.3*  HCT 38.7 36.3 34.4*  MCV 93.9 96.8 95.8  PLT 238 207 225    Basic Metabolic Panel: Recent Labs  Lab 10/04/20 1405 10/04/20 1701 10/05/20 0453 10/06/20 0452  NA 131*  --  133* 135  K 3.0*  --  3.6 2.9*  CL 95*  --  100 98  CO2 21*  --  23 26  GLUCOSE 101*  --  128* 115*  BUN 30*  --  24* 10  CREATININE 1.07*  --  0.78 0.58  CALCIUM 9.0  --  8.9 8.5*  MG  --  1.9  --  1.4*    GFR: Estimated Creatinine Clearance: 79.8 mL/min (by C-G formula based on SCr of 0.58 mg/dL).  Liver Function Tests: Recent Labs  Lab 10/04/20 1405 10/05/20 0453  AST 30 23  ALT 24 20  ALKPHOS 109 98  BILITOT 0.8 0.6  PROT 8.8* 7.6  ALBUMIN 3.9 3.5    CBG: No results for input(s): GLUCAP in the last 168 hours.   Recent Results (from the past 240 hour(s))  Urine culture     Status: Abnormal (Preliminary result)   Collection Time: 10/04/20  1:58 PM   Specimen: Urine, Random  Result Value Ref Range Status   Specimen Description   Final    URINE, RANDOM Performed at [facility], [address], [place], [place]    Special Requests   Final    NONE Performed at [facility], [address] [place], [place]    Culture (A)  Final    >=100,000 COLONIES/mL GRAM NEGATIVE RODS SUSCEPTIBILITIES TO FOLLOW Performed at [facility], [address], [place], [place]    Report Status PENDING  Incomplete  Culture, blood (routine x 2)     Status: None (Preliminary result)   Collection Time: 10/04/20  2:05 PM   Specimen: BLOOD  Result Value Ref Range Status   Specimen Description   Final    BLOOD RIGHT ANTECUBITAL Performed at [facility], [address], [place], [place] [address]    Special Requests   Final    BOTTLES DRAWN AEROBIC AND ANAEROBIC Blood Culture adequate volume Performed at [facility], [address], [place], [place] [address]    Culture   Final    NO GROWTH 2 DAYS Performed at [facility], [address] [place], [place] [address]    Report Status PENDING  Incomplete  Culture, blood (routine x 2)     Status: None (Preliminary result)   Collection Time: 10/04/20  3:19 PM   Specimen: BLOOD LEFT HAND  Result Value Ref Range Status   Specimen Description   Final    BLOOD LEFT HAND Performed at [facility], [address], [place], [place] [address]    Special Requests   Final    BOTTLES DRAWN AEROBIC ONLY Blood Culture results may not be optimal due to an inadequate volume of blood received in culture bottles Performed at [facility], [address], [place], [place] [address]    Culture   Final    NO GROWTH 2 DAYS Performed at [facility], [address] [place], [place] [address]    Report Status PENDING  Incomplete         Radiology Studies: DG Shoulder Right  Result Date: 10/05/2020 CLINICAL DATA:  Recent falls with right shoulder pain, initial encounter EXAM: RIGHT SHOULDER - 2+ VIEW COMPARISON:  None. FINDINGS: Mild degenerative changes of the glenohumeral joint are seen. No acute fracture or dislocation is noted. No  soft tissue abnormality is seen. Underlying bony thorax is within normal limits. IMPRESSION: Mild degenerative change without acute abnormality. Electronically Signed   By: Alcide Clever M.D.   On: 10/05/2020 12:43   CT Head Wo Contrast  Result Date: 10/04/2020 CLINICAL DATA:  Delirium EXAM: CT HEAD WITHOUT CONTRAST TECHNIQUE: Contiguous axial images were obtained from the base of the skull through the vertex without intravenous contrast. COMPARISON:  CT head 02/24/2020 FINDINGS: Brain: No evidence of acute infarction, hemorrhage, hydrocephalus, extra-axial collection or mass lesion/mass effect. Vascular: Negative for hyperdense vessel Skull: Negative Sinuses/Orbits: Paranasal sinuses clear.  Negative orbit Other: None IMPRESSION: Negative CT head Electronically Signed   By: Marlan Palau M.D.   On: 10/04/2020 14:55   US RENAL  Result Date: 10/04/2020 CLINICAL DATA:  Acute renal insufficiency EXAM: RENAL / URINARY TRACT ULTRASOUND COMPLETE COMPARISON:  None. FINDINGS: Right Kidney: Renal measurements: 12.0 x 4.9 by 5.3 cm = volume: 164.8 mL. Echogenicity within normal limits. No mass or hydronephrosis visualized. Left Kidney: Renal measurements: 12.2 x 5.4 x 4.9 cm = volume: 169.3 mL. Echogenicity within normal limits. No mass or hydronephrosis visualized. Bladder: Bladder is decompressed, and cannot be evaluated. Other: None. IMPRESSION: 1. Unremarkable renal ultrasound. 2. Decompressed bladder, limiting evaluation. Electronically Signed   By: Sharlet Salina M.D.   On: 10/04/2020 17:55   DG Chest Portable 1 View  Result Date: 10/04/2020 CLINICAL DATA:  Altered mental status EXAM: PORTABLE CHEST 1 VIEW COMPARISON:  02/23/2020 FINDINGS: Heart size upper normal. Vascularity normal. Negative for heart failure. Slight bibasilar atelectasis. Negative for pneumonia or effusion IMPRESSION: Slight bibasilar atelectasis. Electronically Signed   By: Marlan Palau M.D.   On: 10/04/2020 14:58        Scheduled  Meds:  buprenorphine-naloxone  1 tablet Sublingual Q8H   DULoxetine  60 mg Oral Daily   enoxaparin (LOVENOX) injection  40 mg Subcutaneous Q24H  gabapentin  800 mg Oral TID   potassium chloride  40 mEq Oral Q4H   tiZANidine  4 mg Oral TID   Continuous Infusions:  sodium chloride 125 mL/hr at 10/06/20 0924   cefTRIAXone (ROCEPHIN)  IV 1 g (10/05/20 1359)     LOS: 0 days    Time spent: 35 minutes    Ramiro Harvestaniel Gerarda Conklin, MD [facility]   To contact the attending provider between 7A-7P or the covering provider during after hours 7P-7A, please log into the web site [email or web address] and access using universal [place] password for that web site. If you do not have the password, please call the hospital operator.  10/06/2020, 11:56 AM

## 2020-10-06 NOTE — Progress Notes (Signed)
OT Cancellation Note  Patient Details Name: Stephanie Carrillo MRN: [id number] DOB: Mar 02, 1956   Cancelled Treatment:    Reason Eval/Treat Not Completed: OT screened, no needs identified, will sign off. Per conversation with pt and chart review including PT note, pt appears to be at baseline with ADLs.   Raynald Kemp, OT Acute Rehabilitation Services Pager: [phone] Office: [phone]  10/06/2020, 10:52 AM

## 2020-10-06 NOTE — Consult Note (Signed)
Consultation Note Date: 10/06/2020   Patient Name: Stephanie Carrillo  DOB: 1955-10-01  MRN: [id number]  Age / Sex: 65 y.o., female  PCP: Carmel Sacramento, NP Referring Physician: Rodolph Bong, MD  Reason for Consultation: Establishing goals of care  HPI/Patient Profile: 65 y.o. female  admitted on 10/04/2020    Clinical Assessment and Goals of Care: 65 year old lady who lives by herself in [place], [place].  Has past medical history of hypertension recurrent urinary tract infections depression and anxiety chronic pain.  Patient presented with confusion fever shaking chills and falls and was admitted with recurrent urinary tract infection.  IV antibiotics and IV fluids were initiated. Of consultation for CODE STATUS and goals of care discussions has been requested.  Patient is awake alert resting in bed.  I introduced myself and palliative care as follows: Palliative medicine is specialized medical care for people living with serious illness. It focuses on providing relief from the symptoms and stress of a serious illness. The goal is to improve quality of life for both the patient and the family.  Goals of care: Broad aims of medical therapy in relation to the patient's values and preferences. Our aim is to provide medical care aimed at enabling patients to achieve the goals that matter most to them, given the circumstances of their particular medical situation and their constraints.   Brief life review performed.  Goals wishes and values important to the patient attempted to be explored.  Patient is 65 years old, lives by herself, states that she is divorced and does not have children.  She has a brother who lives in [place], she has siblings in [place] as well.  She moved from [place] to [place] to be closer to her friend.  She names her friend Gary Fleet as well as her brother who lives in  [place] to be her healthcare power of attorney agents and states that she is in the process of completing advanced directives.  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  He states that she has history of back surgery.  Discussed about scope of current hospitalization.  We discussed about her CODE STATUS and overall goals of care.  Patient is fully aware of what DO NOT RESUSCITATE entails.  She wishes to continue with current scope of hospitalization.  She denies hematuria, denies any weakness.  She is pleased with her improvement with physical therapy.  She hopes to go home soon.  NEXT OF KIN Brother who lives in [place], friend Gary Fleet  SUMMARY OF RECOMMENDATIONS   DNR Continue current mode of care Recommend home with home health on discharge Recommend outpatient urology follow-up  Code Status/Advance Care Planning:  DNR    Symptom Management:      Palliative Prophylaxis:   Delirium Protocol  Additional Recommendations (Limitations, Scope, Preferences):  Full Scope Treatment  Psycho-social/Spiritual:   Desire for further Chaplaincy support:yes  Additional Recommendations: Caregiving  Support/Resources  Prognosis:   Unable  to determine  Discharge Planning: Home with Home Health      Primary Diagnoses: Present on Admission: . UTI (urinary tract infection) . Hypokalemia . Chronic pain syndrome . Anxiety and depression . Acute metabolic encephalopathy   I have reviewed the medical record, interviewed the patient and family, and examined the patient. The following aspects are pertinent.  Past Medical History:  Diagnosis Date  . Anxiety   . Depression   . Hypertension   . Insomnia   . Meningitis   . Recurrent UTI   . Seizures (HCC)   . Substance abuse (HCC)    Social History   Socioeconomic History  . Marital status: Divorced    Spouse name: Not on file  . Number of  children: Not on file  . Years of education: Not on file  . Highest education level: Not on file  Occupational History  . Not on file  Tobacco Use  . Smoking status: Former Games developer  . Smokeless tobacco: Never Used  Vaping Use  . Vaping Use: Never used  Substance and Sexual Activity  . Alcohol use: Not Currently  . Drug use: Not Currently    Comment: opiates -clean 6 months  . Sexual activity: Not on file  Other Topics Concern  . Not on file  Social History Narrative  . Not on file   Social Determinants of Health   Financial Resource Strain: Not on file  Food Insecurity: Not on file  Transportation Needs: Not on file  Physical Activity: Not on file  Stress: Not on file  Social Connections: Not on file   Family History  Problem Relation Age of Onset  . Multiple sclerosis Mother   . Cancer Father    Scheduled Meds: . buprenorphine-naloxone  1 tablet Sublingual Q8H  . DULoxetine  60 mg Oral Daily  . enoxaparin (LOVENOX) injection  40 mg Subcutaneous Q24H  . gabapentin  800 mg Oral TID  . tiZANidine  4 mg Oral TID   Continuous Infusions: . cefTRIAXone (ROCEPHIN)  IV 1 g (10/06/20 1327)   PRN Meds:.acetaminophen **OR** acetaminophen, clonazePAM, ibuprofen, ondansetron **OR** ondansetron (ZOFRAN) IV Medications Prior to Admission:  Prior to Admission medications   Medication Sig Start Date End Date Taking? Authorizing Provider  acetaminophen (TYLENOL) 500 MG tablet Take 1,000 mg by mouth every 4 (four) hours.   Yes [provider]  amLODipine (NORVASC) 5 MG tablet Take 1 tablet (5 mg total) by mouth daily. 09/25/18  Yes [place], Eric J, DO  buprenorphine (SUBUTEX) 2 MG SUBL SL tablet Place 2 mg under the tongue every 6 (six) hours. 01/05/20  Yes [provider]  clonazePAM (KLONOPIN) 0.5 MG tablet Take 1 tablet (0.5 mg total) by mouth 3 (three) times daily as needed for anxiety. 02/24/20  Yes Agyei, Hermina Staggers, MD  DULoxetine (CYMBALTA) 60 MG capsule Take 60 mg  by mouth daily. 09/14/20  Yes [provider]  gabapentin (NEURONTIN) 800 MG tablet Take 800 mg by mouth 3 (three) times daily.   Yes [provider]  ibuprofen (ADVIL,MOTRIN) 100 MG tablet Take 200 mg by mouth every 8 (eight) hours as needed for pain.   Yes [provider]  tiZANidine (ZANAFLEX) 4 MG tablet Take 4 mg by mouth 3 (three) times daily. 10/04/20  Yes [provider]  diphenhydrAMINE (BENADRYL) 25 MG tablet Take 25 mg by mouth at bedtime as needed for sleep.    [provider]  ondansetron (ZOFRAN) 4 MG tablet Take 1 tablet (  4 mg total) by mouth every 6 (six) hours as needed for nausea. Patient not taking: No sig reported 09/25/18   [place], Eric J, DO   Allergies  Allergen Reactions  . Ciprofloxacin Shortness Of Breath and Other (See Comments)    Respiratory arrest  . Other Anaphylaxis  . Penicillins Shortness Of Breath and Rash    Did it involve swelling of the face/tongue/throat, SOB, or low BP? y Did it involve sudden or severe rash/hives, skin peeling, or any reaction on the inside of your mouth or nose? y Did you need to seek medical attention at a hospital or doctor's office? n When did it last happen?1985 If all above answers are "NO", may proceed with cephalosporin use.  Marland Kitchen Oxycodone Other (See Comments)    Patient in recovery process.   Review of Systems Weakness  Physical Exam Awake alert oriented resting in bed Regular work of breathing S1-S2 Abdomen not distended no abdominal tenderness No edema No focal deficits Mood and affect within normal limits  Vital Signs: BP 102/60 (BP Location: Left Arm)   Pulse 63   Temp 98.1 F (36.7 C) (Oral)   Resp 16   Ht 5\' 6"  (1.676 m)   Wt 91.2 kg   SpO2 93%   BMI 32.45 kg/m  Pain Scale: 0-10 POSS *See Group Information*: 1-Acceptable,Awake and alert Pain Score: 0-No pain   SpO2: SpO2: 93 % O2 Device:SpO2: 93 % O2 Flow Rate: .   IO: Intake/output summary:    Intake/Output Summary (Last 24 hours) at 10/06/2020 1346 Last data filed at 10/06/2020 12/06/2020 Gross per 24 hour  Intake 4156.59 ml  Output 2100 ml  Net 2056.59 ml    LBM: Last BM Date: 10/02/20 Baseline Weight: Weight: 89.8 kg Most recent weight: Weight: 91.2 kg     Palliative Assessment/Data:   PPS 60%  Time In:  1200 Time Out:  1300 Time Total:  60  Greater than 50%  of this time was spent counseling and coordinating care related to the above assessment and plan.  Signed by: 12/02/20, MD   Please contact Palliative Medicine Team phone at [phone] for questions and concerns.  For individual provider: See [phone]

## 2020-10-06 NOTE — Evaluation (Signed)
Physical Therapy Evaluation Patient Details Name: Stephanie Carrillo MRN: [id number] DOB: 31-Mar-1956 Today's Date: 10/06/2020   History of Present Illness  Patient 65 year old female history of hypertension, recurrent UTIs, depression/anxiety, chronic pain presenting with confusion, fever, shaking chills, increasing falls and worsening confusions 2 days prior to admission.  It was noted that patient was to get a urine sample per urology on day of admission however due to worsening confusion was brought to the ED.  Patient stated has been doing in and out caths per urology recommendations due to urinary retention.  On presentation urinalysis done concerning for UTI.  Head CT done negative.  Clinical Impression  The patient presents with independent ambulation, requires no support from AD No further PT at this time. PT will sign off.    Follow Up Recommendations No PT follow up    Equipment Recommendations  None recommended by PT    Recommendations for Other Services       Precautions / Restrictions Precautions Precautions: Fall      Mobility  Bed Mobility Overal bed mobility: Independent                  Transfers Overall transfer level: Independent                  Ambulation/Gait Ambulation/Gait assistance: Independent Gait Distance (Feet): 50 Feet Assistive device: None Gait Pattern/deviations: WFL(Within Functional Limits)     General Gait Details: no issurs  Stairs            Wheelchair Mobility    Modified Rankin (Stroke Patients Only)       Balance Overall balance assessment: No apparent balance deficits (not formally assessed)                                           Pertinent Vitals/Pain Pain Assessment: Faces Faces Pain Scale: Hurts a little bit Pain Location: back chronic Pain Intervention(s): Premedicated before session;Monitored during session    Home Living Family/patient expects to be discharged to:: Private  residence Living Arrangements: Alone Available Help at Discharge: Friend(s);Available 24 hours/day Type of Home: Apartment Home Access: Level entry     Home Layout: One level Home Equipment: Grab bars - tub/shower      Prior Function Level of Independence: Independent         Comments: not driving, orders groceries for delivery, friend drives to appointments, brother also in room and assists, but may not be local     Hand Dominance        Extremity/Trunk Assessment   Upper Extremity Assessment Upper Extremity Assessment: Overall WFL for tasks assessed    Lower Extremity Assessment Lower Extremity Assessment: Overall WFL for tasks assessed    Cervical / Trunk Assessment Cervical / Trunk Assessment: Normal  Communication   Communication: No difficulties  Cognition Arousal/Alertness: Awake/alert Behavior During Therapy: WFL for tasks assessed/performed Overall Cognitive Status: Within Functional Limits for tasks assessed                                        General Comments      Exercises     Assessment/Plan    PT Assessment Patent does not need any further PT services  PT Problem List         PT  Treatment Interventions      PT Goals (Current goals can be found in the Care Plan section)  Acute Rehab PT Goals Patient Stated Goal: go home PT Goal Formulation: All assessment and education complete, DC therapy    Frequency     Barriers to discharge        Co-evaluation               AM-PAC PT "6 Clicks" Mobility  Outcome Measure Help needed turning from your back to your side while in a flat bed without using bedrails?: None Help needed moving from lying on your back to sitting on the side of a flat bed without using bedrails?: None Help needed moving to and from a bed to a chair (including a wheelchair)?: None Help needed standing up from a chair using your arms (e.g., wheelchair or bedside chair)?: None Help needed to  walk in hospital room?: None Help needed climbing 3-5 steps with a railing? : None 6 Click Score: 24    End of Session   Activity Tolerance: Patient tolerated treatment well Patient left: in bed;with call bell/phone within reach Nurse Communication: Mobility status PT Visit Diagnosis: Unsteadiness on feet (R26.81);History of falling (Z91.81)    Time: 1011-1029 PT Time Calculation (min) (ACUTE ONLY): 18 min   Charges:   PT Evaluation $PT Eval Low Complexity: 1 Low         Blanchard Kelch PT Acute Rehabilitation Services Pager [phone] Office [phone]   Rada Hay 10/06/2020, 10:34 AM

## 2020-10-07 DIAGNOSIS — F419 Anxiety disorder, unspecified: Secondary | ICD-10-CM | POA: Diagnosis not present

## 2020-10-07 DIAGNOSIS — N179 Acute kidney failure, unspecified: Secondary | ICD-10-CM | POA: Diagnosis not present

## 2020-10-07 DIAGNOSIS — G9341 Metabolic encephalopathy: Secondary | ICD-10-CM | POA: Diagnosis not present

## 2020-10-07 DIAGNOSIS — N3001 Acute cystitis with hematuria: Secondary | ICD-10-CM | POA: Diagnosis not present

## 2020-10-07 LAB — BASIC METABOLIC PANEL
Anion gap: 10 (ref 5–15)
BUN: 8 mg/dL (ref 8–23)
CO2: 28 mmol/L (ref 22–32)
Calcium: 8.7 mg/dL — ABNORMAL LOW (ref 8.9–10.3)
Chloride: 98 mmol/L (ref 98–111)
Creatinine, Ser: 0.56 mg/dL (ref 0.44–1.00)
GFR, Estimated: >60 mL/min (ref >60)
Glucose, Bld: 109 mg/dL — ABNORMAL HIGH (ref 70–99)
Potassium: 3.2 mmol/L — ABNORMAL LOW (ref 3.5–5.1)
Sodium: 136 mmol/L (ref 135–145)

## 2020-10-07 LAB — URINE CULTURE: Culture: >=100000 — AB

## 2020-10-07 LAB — CBC WITH DIFFERENTIAL/PLATELET
Abs Immature Granulocytes: 0.08 10*3/uL — ABNORMAL HIGH (ref 0.00–0.07)
Basophils Absolute: 0 10*3/uL (ref 0.0–0.1)
Basophils Relative: 1 %
Eosinophils Absolute: 0 10*3/uL (ref 0.0–0.5)
Eosinophils Relative: 0 %
HCT: 37.8 % (ref 36.0–46.0)
Hemoglobin: 12.4 g/dL (ref 12.0–15.0)
Immature Granulocytes: 1 %
Lymphocytes Relative: 14 %
Lymphs Abs: 1 10*3/uL (ref 0.7–4.0)
MCH: 31.3 pg (ref 26.0–34.0)
MCHC: 32.8 g/dL (ref 30.0–36.0)
MCV: 95.5 fL (ref 80.0–100.0)
Monocytes Absolute: 1.3 10*3/uL — ABNORMAL HIGH (ref 0.1–1.0)
Monocytes Relative: 18 %
Neutro Abs: 4.9 10*3/uL (ref 1.7–7.7)
Neutrophils Relative %: 66 %
Platelets: 263 10*3/uL (ref 150–400)
RBC: 3.96 MIL/uL (ref 3.87–5.11)
RDW: 12.8 % (ref 11.5–15.5)
WBC: 7.3 10*3/uL (ref 4.0–10.5)
nRBC: 0 % (ref 0.0–0.2)

## 2020-10-07 LAB — MAGNESIUM: Magnesium: 1.7 mg/dL (ref 1.7–2.4)

## 2020-10-07 MED ORDER — PANTOPRAZOLE SODIUM 40 MG PO TBEC: 40.0000 mg | DELAYED_RELEASE_TABLET | Freq: Every day | ORAL | 1 refills | Status: DC

## 2020-10-07 MED ORDER — MAGNESIUM OXIDE 400 (241.3 MG) MG PO TABS
400.0000 mg | ORAL_TABLET | Freq: Two times a day (BID) | ORAL | Status: DC
  Administered 2020-10-07: 400 mg via ORAL
  Filled 2020-10-07: qty 1

## 2020-10-07 MED ORDER — CEPHALEXIN 500 MG PO CAPS
500.0000 mg | ORAL_CAPSULE | Freq: Four times a day (QID) | ORAL | Status: DC
  Administered 2020-10-07: 500 mg via ORAL
  Filled 2020-10-07: qty 1

## 2020-10-07 MED ORDER — MAGNESIUM OXIDE 400 (241.3 MG) MG PO TABS: 400.0000 mg | ORAL_TABLET | Freq: Two times a day (BID) | ORAL | 0 refills | Status: AC

## 2020-10-07 MED ORDER — ONDANSETRON HCL 4 MG PO TABS: 4.0000 mg | ORAL_TABLET | Freq: Four times a day (QID) | ORAL | 0 refills | Status: DC | PRN

## 2020-10-07 MED ORDER — MAGNESIUM SULFATE 4 GM/100ML IV SOLN
4.0000 g | Freq: Once | INTRAVENOUS | Status: DC
  Filled 2020-10-07: qty 100

## 2020-10-07 MED ORDER — PANTOPRAZOLE SODIUM 40 MG PO TBEC
40.0000 mg | DELAYED_RELEASE_TABLET | Freq: Every day | ORAL | Status: DC
  Administered 2020-10-07: 40 mg via ORAL
  Filled 2020-10-07: qty 1

## 2020-10-07 MED ORDER — POTASSIUM CHLORIDE CRYS ER 20 MEQ PO TBCR
40.0000 meq | EXTENDED_RELEASE_TABLET | ORAL | Status: AC
Start: 2020-10-07 — End: 2020-10-07
  Administered 2020-10-07 (×2): 40 meq via ORAL
  Filled 2020-10-07 (×2): qty 2

## 2020-10-07 MED ORDER — CEPHALEXIN 500 MG PO CAPS: 500.0000 mg | ORAL_CAPSULE | Freq: Four times a day (QID) | ORAL | 0 refills | Status: AC

## 2020-10-07 MED ORDER — AMLODIPINE BESYLATE 5 MG PO TABS
5.0000 mg | ORAL_TABLET | Freq: Every day | ORAL | Status: DC
  Administered 2020-10-07: 5 mg via ORAL
  Filled 2020-10-07: qty 1

## 2020-10-07 NOTE — Discharge Instructions (Signed)
Acute Kidney Injury, Adult  Acute kidney injury is a sudden worsening of kidney function. The kidneys are organs that have several jobs. They filter the blood to remove waste products and extra fluid. They also maintain a healthy balance of minerals and hormones in the body, which helps control blood pressure and keep bones strong. With this condition, your kidneys do not do their jobs as well as they should. This condition ranges from mild to severe. Over time, it may develop into long-lasting (chronic) kidney disease. Early detection and treatment may prevent acute kidney injury from developing into a chronic condition. What are the causes? Common causes of this condition include:  A problem with blood flow to the kidneys. This may be caused by: ? Low blood pressure (hypotension) or shock. ? Blood loss. ? Heart and blood vessel (cardiovascular) disease. ? Severe burns. ? Liver disease.  Direct damage to the kidneys. This may be caused by: ? Certain medicines. ? A kidney infection. ? Poisoning. ? Being around or in contact with toxic substances. ? A surgical wound. ? A hard, direct hit to the kidney area.  A sudden blockage of urine flow. This may be caused by: ? Cancer. ? Kidney stones. ? An enlarged prostate in males. What increases the risk? You are more likely to develop this condition if you:  Are older than age 67.  Are female.  Are hospitalized, especially if you are in critical condition.  Have certain conditions, such as: ? Chronic kidney disease. ? Diabetes. ? Coronary artery disease and heart failure. ? Pulmonary disease. ? Chronic liver disease. What are the signs or symptoms? Symptoms of this condition may not be obvious until the condition becomes severe. Symptoms of this condition can include:  Tiredness (lethargy) or difficulty staying awake.  Nausea or vomiting.  Swelling (edema) of the face, legs, ankles, or feet.  Problems with urination, such  as: ? Pain in the abdomen, or pain along the side of your stomach (flank). ? Producing little or no urine. ? Passing urine with a weak flow.  Muscle twitches and cramps, especially in the legs.  Confusion or trouble concentrating.  Loss of appetite.  Fever. How is this diagnosed? Your health care provider can diagnose this condition based on your symptoms, medical history, and a physical exam.  You may also have other tests, such as:  Blood tests.  Urine tests.  Imaging tests.  A test in which a sample of tissue is removed from the kidneys to be examined under a microscope (kidney biopsy). How is this treated? Treatment for this condition depends on the cause and how severe the condition is. In mild cases, treatment may not be needed. The kidneys may heal on their own. In more severe cases, treatment will involve:  Treating the cause of the kidney injury. This may involve changing any medicines you are taking or adjusting your dosage.  Fluids. You may need specialized IV fluids to balance your body's needs.  Having a catheter placed to drain urine and prevent blockages.  Preventing problems from occurring. This may mean avoiding certain medicines or procedures that can cause further injury to the kidneys. In some cases, treatment may also require:  A procedure to remove toxic wastes from the body (dialysis or continuous renal replacement therapy, CRRT).  Surgery. This may be done to repair a torn kidney or to remove the blockage from the urinary system. Follow these instructions at home: Medicines  Take over-the-counter and prescription medicines only as  told by your health care provider.  Do not take any new medicines without your health care provider's approval. Many medicines can worsen your kidney damage.  Do not take any vitamin and mineral supplements without your health care provider's approval. Many nutritional supplements can worsen your kidney  damage. Lifestyle  If your health care provider prescribed changes to your diet, follow them. You may need to decrease the amount of protein you eat.  Achieve and maintain a healthy weight. If you need help with this, ask your health care provider.  Start or continue an exercise plan. Try to exercise at least 30 minutes a day, 5 days a week.  Do not use any products that contain nicotine or tobacco, such as cigarettes, e-cigarettes, and chewing tobacco. If you need help quitting, ask your health care provider.   General instructions  Keep track of your blood pressure. Report changes in your blood pressure as told by your health care provider.  Stay up to date with your vaccines. Ask your health care provider which vaccines you need.  Keep all follow-up visits as told by your health care provider. This is important.   Where to find more information  American Association of Kidney Patients: BombTimer.gl  National Kidney Foundation: www.kidney.[place]: https://mathis.com/  [facility]: ? www.lifeoptions.org ? www.kidneyschool.org Contact a health care provider if:  Your symptoms get worse.  You develop new symptoms. Get help right away if:  You develop symptoms of worsening kidney disease, which include: ? Headaches. ? Abnormally dark or light skin. ? Easy bruising. ? Frequent hiccups. ? Chest pain. ? Shortness of breath. ? End of menstruation in women. ? Seizures. ? Confusion or altered mental status. ? Abdominal or back pain. ? Itchiness.  You have a fever.  Your body is producing less urine.  You have pain or bleeding when you urinate. Summary  Acute kidney injury is a sudden worsening of kidney function.  Acute kidney injury can be caused by problems with blood flow to the kidneys, direct damage to the kidneys, and sudden blockage of urine flow.  Symptoms of this condition may not be obvious until it becomes severe.  Symptoms may include edema, lethargy, confusion, nausea or vomiting, and problems passing urine.  This condition can be diagnosed with blood tests, urine tests, and imaging tests. Sometimes a kidney biopsy is done to diagnose this condition.  Treatment for this condition often involves treating the underlying cause. It is treated with fluids, medicines, diet changes, dialysis, or surgery. This information is not intended to replace advice given to you by your health care provider. Make sure you discuss any questions you have with your health care provider. Document Revised: 05/31/2019 Document Reviewed: 05/31/2019 Elsevier Patient Education  2021 [place].   Contrast-Induced Nephropathy Contrast-induced nephropathy (CIN) is a rare type of kidney damage caused by the dye that may be used in certain imaging tests. This dye is called a contrast medium. For these tests, the dye may be injected into a person's body to make tissue or blood vessels show up more clearly. A contrast medium may be used in imaging tests such as:  CT scans.  MRIs.  Angiograms. With CIN, the contrast medium may damage kidney cells directly. It may also cause a reaction in the kidneys that decreases blood supply and oxygen to the kidneys. Lack of blood and oxygen causes temporary kidney damage that usually gets better in about 2 weeks. Very rarely, severe damage may  require the kidneys to be filtered by a machine (dialysis). What are the causes? The exact cause of CIN is not known. In some cases, there may be more than one cause. What increases the risk? The following factors may make you more likely to develop this condition:  Having kidney disease before you are exposed to contrast media. This is the main risk factor.  Having other conditions, such as: ? Dehydration. ? Diabetes. ? High blood pressure. ? Low blood pressure. ? Heart disease. ? Anemia. ? A type of blood cancer that affects the kidneys  (multiple myeloma).  Older age.  Needing an emergency exam that requires the use of contrast media.  Receiving a high dose of contrast media or receiving contrast media within 72 hours of the previous time you got it.  Having contrast media injected into a blood vessel that carries blood away from the heart (artery).  Being on a medicine that affects kidney function. These include NSAIDs, some antibiotics, and some cancer drugs. What are the signs or symptoms? Symptoms of this condition often develop 2-3 days after getting contrast media. Symptoms may include:  Feeling tired (fatigue).  Appetite loss.  Swelling of the feet or ankles.  Puffy, swollen eyes.  Dry, itchy skin. In some cases, there are no symptoms. How is this diagnosed? This condition may be diagnosed based on:  Your symptoms and medical history. Your health care provider may suspect CIN if you recently had an imaging test or procedure that included the use of contrast media.  A physical exam.  Blood and urine tests to check for kidney damage. How is this treated? There is no specific treatment for CIN. If you develop CIN, you may be given fluids through an IV that is inserted into one of your veins. This will help keep your kidneys active. Electrolytes may be added to the fluid if you have an electrolyte imbalance. In most cases, CIN will get better in about 2 weeks. In rare cases, you may need kidney dialysis if kidney failure occurs and urine flow decreases. Even if you need dialysis, the condition usually improves over time. Follow these instructions at home:  Take over-the-counter and prescription medicines only as told by your health care provider. Over-the-counter NSAIDs, such as aspirin or ibuprofen, can increase your risk of CIN.  Return to your normal activities as told by your health care provider. Ask your health care provider what activities are safe for you.  Drink enough fluid to keep your urine  pale yellow.  Always let a health care provider know that you have a history of CIN before having any test or procedure that may include contrast material.  Keep all follow-up visits as told by your health care provider. This is important.   How is this prevented? Take the following steps to help reduce your risk for developing CIN:  Tell your health care provider about any risk factors you have for CIN.  Ask if a contrast medium is necessary for imaging tests or procedures you are scheduled to have. Ask if there is an alternative to the test.  Make sure you drink plenty of fluids prior to any imaging tests or procedures that require contrast medium.  If possible, do not undergo multiple imaging tests or procedures that require contrast medium unless enough time has passed between tests.   Contact a health care provider if:  You have any signs or symptoms of CIN.  You are urinating less than usual. Summary  Contrast-induced nephropathy (CIN) is a rare type of kidney damage caused by contrast medium.  The exact cause of CIN is not known. In some cases, there may be more than one cause.  Sometimes CIN does not cause any symptoms. If you do have symptoms, they may start 2-3 days after getting contrast medium.  To confirm the diagnosis, you may have blood tests and urine tests to check for kidney damage.  There is no specific treatment for CIN. In most cases, this condition will usually get better in about 2 weeks. You may be given IV fluids with electrolytes. In rare cases, dialysis is needed. This information is not intended to replace advice given to you by your health care provider. Make sure you discuss any questions you have with your health care provider. Document Revised: 11/02/2017 Document Reviewed: 11/02/2017 Elsevier Patient Education  2021 [place] Prevention in the Home, Adult Falls can cause injuries and can happen to people of all ages. There are many  things you can do to make your home safe and to help prevent falls. Ask for help when making these changes. What actions can I take to prevent falls? General Instructions  Use good lighting in all rooms. Replace any light bulbs that burn out.  Turn on the lights in dark areas. Use night-lights.  Keep items that you use often in easy-to-reach places. Lower the shelves around your home if needed.  Set up your furniture so you have a clear path. Avoid moving your furniture around.  Do not have throw rugs or other things on the floor that can make you trip.  Avoid walking on wet floors.  If any of your floors are uneven, fix them.  Add color or contrast paint or tape to clearly mark and help you see: ? Grab bars or handrails. ? First and last steps of staircases. ? Where the edge of each step is.  If you use a stepladder: ? Make sure that it is fully opened. Do not climb a closed stepladder. ? Make sure the sides of the stepladder are locked in place. ? Ask someone to hold the stepladder while you use it.  Know where your pets are when moving through your home. What can I do in the bathroom?  Keep the floor dry. Clean up any water on the floor right away.  Remove soap buildup in the tub or shower.  Use nonskid mats or decals on the floor of the tub or shower.  Attach bath mats securely with double-sided, nonslip rug tape.  If you need to sit down in the shower, use a plastic, nonslip stool.  Install grab bars by the toilet and in the tub and shower. Do not use towel bars as grab bars.      What can I do in the bedroom?  Make sure that you have a light by your bed that is easy to reach.  Do not use any sheets or blankets for your bed that hang to the floor.  Have a firm chair with side arms that you can use for support when you get dressed. What can I do in the kitchen?  Clean up any spills right away.  If you need to reach something above you, use a step stool with  a grab bar.  Keep electrical cords out of the way.  Do not use floor polish or wax that makes floors slippery. What can I do with my stairs?  Do not leave  any items on the stairs.  Make sure that you have a light switch at the top and the bottom of the stairs.  Make sure that there are handrails on both sides of the stairs. Fix handrails that are broken or loose.  Install nonslip stair treads on all your stairs.  Avoid having throw rugs at the top or bottom of the stairs.  Choose a carpet that does not hide the edge of the steps on the stairs.  Check carpeting to make sure that it is firmly attached to the stairs. Fix carpet that is loose or worn. What can I do on the outside of my home?  Use bright outdoor lighting.  Fix the edges of walkways and driveways and fix any cracks.  Remove anything that might make you trip as you walk through a door, such as a raised step or threshold.  Trim any bushes or trees on paths to your home.  Check to see if handrails are loose or broken and that both sides of all steps have handrails.  Install guardrails along the edges of any raised decks and porches.  Clear paths of anything that can make you trip, such as tools or rocks.  Have leaves, snow, or ice cleared regularly.  Use sand or salt on paths during winter.  Clean up any spills in your garage right away. This includes grease or oil spills. What other actions can I take?  Wear shoes that: ? Have a low heel. Do not wear high heels. ? Have rubber bottoms. ? Feel good on your feet and fit well. ? Are closed at the toe. Do not wear open-toe sandals.  Use tools that help you move around if needed. These include: ? Canes. ? Walkers. ? Scooters. ? Crutches.  Review your medicines with your doctor. Some medicines can make you feel dizzy. This can increase your chance of falling. Ask your doctor what else you can do to help prevent falls. Where to find more  information  Centers for Disease Control and Prevention, [place]: http://www.wolf.info/  National Institute on Aging: http://kim-miller.com/ Contact a doctor if:  You are afraid of falling at home.  You feel weak, drowsy, or dizzy at home.  You fall at home. Summary  There are many simple things that you can do to make your home safe and to help prevent falls.  Ways to make your home safe include removing things that can make you trip and installing grab bars in the bathroom.  Ask for help when making these changes in your home. This information is not intended to replace advice given to you by your health care provider. Make sure you discuss any questions you have with your health care provider. Document Revised: 02/22/2020 Document Reviewed: 02/22/2020 Elsevier Patient Education  [place].   BK Virus Infection, Adult  BK virus is a common infection that can cause a mild respiratory illness. It is also called polyomavirus. After the first infection, the virus remains in the kidneys and other parts of the urinary tract, but it is inactive (latent). BK virus very rarely causes problems in healthy people. Once the virus becomes inactive, a person may never have any more symptoms. If your body's defense system (immune system) is weak, the BK virus may become active again. If this happens, the virus can cause inflammation in the urinary tract and can damage the kidneys. What are the causes? The initial BK virus is a common infection caused by a virus. A weakened  immune system can reactivate the BK virus and cause an infection. Many things can weaken the immune system, including:  Diseases or medical conditions, such as HIV (human immunodeficiency virus), AIDS (acquired immunodeficiency syndrome), and diabetes.  Chemotherapy and other medicines that suppress or weaken the immune system.  Solid organ transplants, especially kidney transplants.  Bone marrow transplants.  Stem cell  transplants. What are the signs or symptoms? Symptoms of a BK virus infection include:  Blurry vision.  Brown or red urine.  Painful or difficult urination.  Urinating more than usual.  Flu-like symptoms, including fever, fatigue, muscle aches, and weakness.  Trouble breathing or coughing.  Seizures. Sometimes, a BK virus infection may not cause any symptoms. How is this diagnosed? This condition is diagnosed based on your symptoms, your medical history, and a physical exam. The exam may include blood and urine tests to confirm the diagnosis and evaluate how well your kidneys are working. To confirm the diagnosis, your health care provider may need to take a small sample of your kidney and have it examined (biopsy). How is this treated? If you have mild BK virus symptoms, treatment may not be needed. Treatment for a more severe BK virus infection may include:  Medicine for pain or discomfort.  Antiviral medicines to lower the amount of BK virus in the body.  IV fluids to help wash (flush) the virus out of the bladder. If you are taking medicine that suppresses the immune system (immunosuppressant) or prevents the body from rejecting a transplanted organ (anti-rejection medicine), the dosage of the medicine may be adjusted or lowered. Follow these instructions at home:  Take over-the-counter and prescription medicines only as told by your health care provider.  Drink enough fluid to keep your urine pale yellow.  Return to your normal activities as told by your health care provider. Ask your health care provider what activities are safe for you.  Keep all follow-up visits. This is important.   Contact a health care provider if:  You have new or worsening symptoms.  You have pain or discomfort while urinating.  Your urine is brownish or bloody.  You have pain or cramping in your abdomen.  You have vision changes.  You have swelling in your feet and lower legs. Get help  right away if:  You have severe pain and cramping in the back or abdomen.  You have trouble breathing. These symptoms may represent a serious problem that is an emergency. Do not wait to see if the symptoms will go away. Get medical help right away. Call your local emergency services (911 in the [place] Do not drive yourself to the hospital. Summary  BK virus is a common infection that can cause a mild respiratory illness. After the first infection, the virus remains inactive in the urinary system. It may become active again if you have a weakened immune system.  To confirm the diagnosis, your health care provider may need to do blood and urine tests and take a small sample of your kidney and have it examined (biopsy).  Treatment for a more severe BK virus infection may include medicines and IV fluids. This information is not intended to replace advice given to you by your health care provider. Make sure you discuss any questions you have with your health care provider. Document Revised: 04/18/2020 Document Reviewed: 04/18/2020 Elsevier Patient Education  2021 [place], Adult Being a patient in the hospital puts you at risk for falling.  Falls can cause serious injury and harm, but they can be prevented. It is important to understand what puts you at risk for falling and what you and your health care team can do to prevent you from falling. If you or a loved one falls at the hospital, it is important to tell hospital staff about it. What increases the risk for falls? Certain conditions and treatments may increase your risk of falling in the hospital. These include:  Being in an unfamiliar environment, especially when using the bathroom at night.  Having surgery.  Being on bed rest.  Taking many medicines or certain types of medicines, such as sleeping pills.  Having tubes in place, such as IV lines or catheters. Other risk factors for falls in a  hospital include:  Having difficulty with hearing or vision.  Having a change in thinking or behavior, such as confusion.  Having depression.  Having trouble with balance.  Being a female.  Feeling dizzy.  Needing to use the toilet frequently.  Having fallen during the past three months.  Having low blood pressure. What are some strategies for preventing falls? If you or a loved one has to stay in the hospital:  Ask about which fall prevention strategies will be in place. Do not hesitate to speak up if you notice that the fall prevention plan has changed.  Ask for help moving around, especially after surgery or when feeling unwell.  If you have been asked to call for help when getting up, do not get up by yourself. Asking for help with getting up is for your safety, and the staff is there to help you.  Wear nonskid footwear.  Get up slowly, and sit at the side of the bed for a few minutes before standing up.  Keep items you need, such as the nurse call button or a phone, close to you so that you do not need to reach for them.  Wear eyeglasses or hearing aids if you have them.  Have someone stay in the hospital with you or your loved one.  Ask if sleeping pills or other medicines that can cause confusion are necessary.   What does the hospital staff do to help prevent falls? Hospitals have systems in place to prevent falls and accidents, which may involve:  Discussing your fall risks and making a personalized fall prevention plan.  Checking in regularly to see if you need help.  Placing an arm band on your wrist or a sign near your room to alert other staff of your needs.  Using an alarm on your hospital bed. This is an alarm that goes off if you get out of bed and forget to call for help.  Keeping the bed in a low and locked position.  Keeping the area around the bed and bathroom well-lit and free from clutter.  Keeping your room quiet, so that you can sleep and be  well-rested.  Using safety equipment, such as: ? A belt around your waist. ? Walkers, crutches, and other devices for support. ? Safety beds, such as low beds or cushions on the floor next to the bed.  Having a staff person stay with you (one-on-one observation), even when you are using the bathroom. This is for your safety.  Using video monitoring. This allows a staff member to come to help you if you need help.   What other actions can I take to lower my risk of falls?  Check in regularly with your health  care provider or pharmacist to review all of the medicines that you take.  Make sure that you have a regular exercise program to stay fit. This will help you maintain your balance.  Talk with a physical therapist or trainer if recommended by your health care provider. They can help you to improve your strength, balance, and endurance.  If you are over age 5: ? Ask your health care provider if you need a calcium or vitamin D supplement. ? Have your eyes and hearing checked every year. ? Have your feet checked every year. Where to find more information You can find more information about fall prevention from the Centers for Disease Control and Prevention: ImproveLook.cz Summary  Being in an unfamiliar environment, such as the hospital, increases your risk for falling.  If you have been asked to call for help when getting up, do not get up by yourself. Asking for help with getting up is for your safety, and the staff is there to help you.  Ask about which fall prevention strategies will be in place. Do not hesitate to speak up if you notice that the fall prevention plan has changed.  If you or a loved one falls, tell the hospital staff. This is important. This information is not intended to replace advice given to you by your health care provider. Make sure you discuss any questions you have with your health care provider. Document Revised: 07/03/2017 Document Reviewed:  03/04/2017 Elsevier Patient Education  2021 [employer].

## 2020-10-07 NOTE — Progress Notes (Addendum)
Discharge instructions gone over with Misty Stanley. Next medication doses are listed on the discharge medication sheet. Four prescriptions were given to [facility] to have filled. Patient denies having any questions about her discharge. She was discharged via wheelchair to her friend that will be bringing her home. DNR Form given to patient to have at home.

## 2020-10-07 NOTE — Discharge Summary (Signed)
Physician Discharge Summary  Stephanie Carrillo IRJ:[id number] DOB: 1956-03-05 DOA: 10/04/2020  PCP: Carmel Sacramentoerry, Tyler, NP  Admit date: 10/04/2020 Discharge date: 10/07/2020  Time spent: 55 minutes  Recommendations for Outpatient Follow-up:  1. Follow-up with Carmel Sacramentoerry, Tyler, NP in 2 weeks.  On follow-up patient will need a basic metabolic profile, magnesium level checked to follow-up on electrolytes and renal function. 2. Follow-up with Dr. Arita MissPace, urology in 2 weeks.   Discharge Diagnoses:  Principal Problem:   UTI (urinary tract infection) Active Problems:   Anxiety and depression   Chronic pain syndrome   Hypokalemia   Acute metabolic encephalopathy   AKI (acute kidney injury) (HCC)   Fall   Discharge Condition: Stable and improved  Diet recommendation: Regular  Filed Weights   10/04/20 1659 10/06/20 0548  Weight: 89.8 kg 91.2 kg    History of present illness:  HPI per Dr. Satira SarkKyle Nailea Romo is a 65 y.o. female with medical history significant of HTN, recurrent UTI, depression/anxiety. Presented with confusion. History was from friend who checks in on her frequently. The patient with a history of recurrent UTI followed by Dr. Arita MissPace with urology. Her friend stated that on Monday the patient was seen shaking and with chills. She believes the patient had a temperature of 105. She complained of urinary symptoms. She refused to go to the hospital or PCP. She started having falls 2 days ago and became more confused. The patient was supposed to get a urine sample to Dr. Arita MissPace on day of admission, but her confusion was too great. The friend brought her to the ED instead. She denied any other aggravating or alleviating factors.    ED Course: Found to be confused and with a dirty UA. She was started on rocephin. CTH was negative for acute change. [facility] was called for admission.   Hospital Course:  1 recurrent E. coli UTI Patient presented with confusion, shaking chills, fever at home with a temp as high  as 105 per patient, was undergoing ongoing I&O caths for urinary retention.  T-max 102.5 2121 hrs.(10/05/2020).  Patient pancultured.  Urine cultures with > 100,000 colonies of E. Coli, which was sensitive to ampicillin, cephalosporins, gentamicin, imipenem, Macrobid, Zosyn, Unasyn and resistant to Bactrim and ciprofloxacin.  Patient was placed on IV Rocephin during the hospitalization improved clinically.  Fever curve trended down.  Patient's mentation improved.  Patient was discharged home on 4 more days of oral Keflex to complete a 7-day course of treatment.  Outpatient follow-up with PCP or urology.   2.  Acute metabolic encephalopathy Likely secondary to problem #1.   CT head obtained was negative.  Patient pancultured with urine cultures with E. coli.  Patient placed empirically on IV antibiotics during the hospitalization.  Patient improved clinically and was back to baseline by day of discharge.   3.  Falls Likely secondary to recurrent UTIs and acute infection.    Patient seen by PT/OT during the hospitalization and was felt patient did not need any home health needs.  Outpatient follow-up.    4.  Hypertension Blood pressure borderline on admission and as such antihypertensive medications initially held.  Once blood pressure improved and patient's antihypertensive medication was resumed on discharge.  Outpatient follow-up.  5.  Depression/anxiety Maintained on home regimen of Cymbalta, Klonopin as needed.  Remained stable throughout the hospitalization.  Outpatient follow-up.   6.  Chronic pain Patient maintained on home regimen Neurontin, Zanaflex.    Suboxone resumed at every 8 hours during the hospitalization.  Ibuprofen was held.  Patient's pain was controlled.  Patient be discharged back on home regimen of pain medications.  Outpatient follow-up.   7.  Acute kidney injury Secondary to prerenal azotemia in the setting of UTI.    Patient's antihypertensive medications were held and  patient hydrated with IV fluids.  Renal function improved and acute kidney injury had resolved by day of discharge.  Outpatient follow-up.    8.  Hypokalemia/hypomagnesemia Repleted.  Patient placed on magnesium oxide 400 mg twice daily x7 days on discharge.  Outpatient follow-up with PCP.     Procedures:  Chest x-ray 10/04/2020  Plain films of the right shoulder pending 10/05/2020  CT head 10/04/2020  Renal ultrasound 10/04/2020    Consultations:  Palliative care: Dr. Linna Darner 10/06/2020    Discharge Exam: Vitals:   10/07/20 0507 10/07/20 1044  BP: (!) 142/79 (!) 139/93  Pulse: 86   Resp: 14   Temp: 98.4 F (36.9 C)   SpO2: 95%     General: NAD Cardiovascular: RRR Respiratory: CTAB  Discharge Instructions   Discharge Instructions    Diet general   Complete by: As directed    Increase activity slowly   Complete by: As directed      Allergies as of 10/07/2020      Reactions   Ciprofloxacin Shortness Of Breath, Other (See Comments)   Respiratory arrest   Other Anaphylaxis   Penicillins Shortness Of Breath, Rash   Did it involve swelling of the face/tongue/throat, SOB, or low BP? y Did it involve sudden or severe rash/hives, skin peeling, or any reaction on the inside of your mouth or nose? y Did you need to seek medical attention at a hospital or doctor's office? n When did it last happen?1985 If all above answers are "NO", may proceed with cephalosporin use.   Oxycodone Other (See Comments)   Patient in recovery process.      Medication List    TAKE these medications   acetaminophen 500 MG tablet Commonly known as: TYLENOL Take 1,000 mg by mouth every 4 (four) hours.   amLODipine 5 MG tablet Commonly known as: NORVASC Take 1 tablet (5 mg total) by mouth daily.   buprenorphine 2 MG Subl SL tablet Commonly known as: SUBUTEX Place 2 mg under the tongue every 6 (six) hours.   cephALEXin 500 MG capsule Commonly known as: KEFLEX Take 1 capsule (500 mg  total) by mouth every 6 (six) hours for 4 days. Start taking on: October 08, 2020   clonazePAM 0.5 MG tablet Commonly known as: KLONOPIN Take 1 tablet (0.5 mg total) by mouth 3 (three) times daily as needed for anxiety.   diphenhydrAMINE 25 MG tablet Commonly known as: BENADRYL Take 25 mg by mouth at bedtime as needed for sleep.   DULoxetine 60 MG capsule Commonly known as: CYMBALTA Take 60 mg by mouth daily.   gabapentin 800 MG tablet Commonly known as: NEURONTIN Take 800 mg by mouth 3 (three) times daily.   ibuprofen 100 MG tablet Commonly known as: ADVIL Take 200 mg by mouth every 8 (eight) hours as needed for pain.   magnesium oxide 400 (241.3 Mg) MG tablet Commonly known as: MAG-OX Take 1 tablet (400 mg total) by mouth 2 (two) times daily for 7 days.   ondansetron 4 MG tablet Commonly known as: ZOFRAN Take 1 tablet (4 mg total) by mouth every 6 (six) hours as needed for nausea.   pantoprazole 40 MG tablet Commonly known as: PROTONIX Take 1  tablet (40 mg total) by mouth daily at 6 (six) AM. Start taking on: October 08, 2020   tiZANidine 4 MG tablet Commonly known as: ZANAFLEX Take 4 mg by mouth 3 (three) times daily.      Allergies  Allergen Reactions  . Ciprofloxacin Shortness Of Breath and Other (See Comments)    Respiratory arrest  . Other Anaphylaxis  . Penicillins Shortness Of Breath and Rash    Did it involve swelling of the face/tongue/throat, SOB, or low BP? y Did it involve sudden or severe rash/hives, skin peeling, or any reaction on the inside of your mouth or nose? y Did you need to seek medical attention at a hospital or doctor's office? n When did it last happen?1985 If all above answers are "NO", may proceed with cephalosporin use.  Marland Kitchen Oxycodone Other (See Comments)    Patient in recovery process.    Follow-up Information    Carmel Sacramento, NP. Schedule an appointment as soon as possible for a visit in 2 week(s).   Specialty: Nurse  Practitioner Contact information: [address] [place] [address] [phone]        Noel Christmas, MD. Schedule an appointment as soon as possible for a visit in 2 week(s).   Specialty: Urology Contact information: [address] [place] [address] [place] [address] [phone]                The results of significant diagnostics from this hospitalization (including imaging, microbiology, ancillary and laboratory) are listed below for reference.    Significant Diagnostic Studies: DG Shoulder Right  Result Date: 10/05/2020 CLINICAL DATA:  Recent falls with right shoulder pain, initial encounter EXAM: RIGHT SHOULDER - 2+ VIEW COMPARISON:  None. FINDINGS: Mild degenerative changes of the glenohumeral joint are seen. No acute fracture or dislocation is noted. No soft tissue abnormality is seen. Underlying bony thorax is within normal limits. IMPRESSION: Mild degenerative change without acute abnormality. Electronically Signed   By: Alcide Clever M.D.   On: 10/05/2020 12:43   CT Head Wo Contrast  Result Date: 10/04/2020 CLINICAL DATA:  Delirium EXAM: CT HEAD WITHOUT CONTRAST TECHNIQUE: Contiguous axial images were obtained from the base of the skull through the vertex without intravenous contrast. COMPARISON:  CT head 02/24/2020 FINDINGS: Brain: No evidence of acute infarction, hemorrhage, hydrocephalus, extra-axial collection or mass lesion/mass effect. Vascular: Negative for hyperdense vessel Skull: Negative Sinuses/Orbits: Paranasal sinuses clear.  Negative orbit Other: None IMPRESSION: Negative CT head Electronically Signed   By: Marlan Palau M.D.   On: 10/04/2020 14:55   US RENAL  Result Date: 10/04/2020 CLINICAL DATA:  Acute renal insufficiency EXAM: RENAL / URINARY TRACT ULTRASOUND COMPLETE COMPARISON:  None. FINDINGS: Right Kidney: Renal measurements: 12.0 x 4.9 by 5.3 cm = volume: 164.8 mL. Echogenicity within normal limits. No mass or hydronephrosis visualized.  Left Kidney: Renal measurements: 12.2 x 5.4 x 4.9 cm = volume: 169.3 mL. Echogenicity within normal limits. No mass or hydronephrosis visualized. Bladder: Bladder is decompressed, and cannot be evaluated. Other: None. IMPRESSION: 1. Unremarkable renal ultrasound. 2. Decompressed bladder, limiting evaluation. Electronically Signed   By: Sharlet Salina M.D.   On: 10/04/2020 17:55   DG Chest Portable 1 View  Result Date: 10/04/2020 CLINICAL DATA:  Altered mental status EXAM: PORTABLE CHEST 1 VIEW COMPARISON:  02/23/2020 FINDINGS: Heart size upper normal. Vascularity normal. Negative for heart failure. Slight bibasilar atelectasis. Negative for pneumonia or effusion IMPRESSION: Slight bibasilar atelectasis. Electronically Signed   By: Leonette Most  Chestine Spore M.D.   On: 10/04/2020 14:58    Microbiology: Recent Results (from the past 240 hour(s))  Urine culture     Status: Abnormal   Collection Time: 10/04/20  1:58 PM   Specimen: Urine, Random  Result Value Ref Range Status   Specimen Description   Final    URINE, RANDOM Performed at [facility], [address] [place], [place] [address]    Special Requests   Final    NONE Performed at [facility], [address] [place], [place] [address]    Culture >=100,000 COLONIES/mL ESCHERICHIA COLI (A)  Final   Report Status 10/07/2020 FINAL  Final   Organism ID, Bacteria ESCHERICHIA COLI (A)  Final      Susceptibility   Escherichia coli - MIC*    AMPICILLIN <=2 SENSITIVE Sensitive     CEFAZOLIN <=4 SENSITIVE Sensitive     CEFEPIME <=0.12 SENSITIVE Sensitive     CEFTRIAXONE <=0.25 SENSITIVE Sensitive     CIPROFLOXACIN >=4 RESISTANT Resistant     GENTAMICIN <=1 SENSITIVE Sensitive     IMIPENEM <=0.25 SENSITIVE Sensitive     NITROFURANTOIN <=16 SENSITIVE Sensitive     TRIMETH/SULFA >=320 RESISTANT Resistant     AMPICILLIN/SULBACTAM <=2 SENSITIVE Sensitive     PIP/TAZO <=4 SENSITIVE Sensitive     * >=100,000  COLONIES/mL ESCHERICHIA COLI  Culture, blood (routine x 2)     Status: None (Preliminary result)   Collection Time: 10/04/20  2:05 PM   Specimen: BLOOD  Result Value Ref Range Status   Specimen Description   Final    BLOOD RIGHT ANTECUBITAL Performed at [facility], [address], [place], [place] [address]    Special Requests   Final    BOTTLES DRAWN AEROBIC AND ANAEROBIC Blood Culture adequate volume Performed at [facility], [address] [place], [place] [address]    Culture   Final    NO GROWTH 3 DAYS Performed at [facility], [address] [place], [place] [address]    Report Status PENDING  Incomplete  Culture, blood (routine x 2)     Status: None (Preliminary result)   Collection Time: 10/04/20  3:19 PM   Specimen: BLOOD LEFT HAND  Result Value Ref Range Status   Specimen Description   Final    BLOOD LEFT HAND Performed at [facility], [address], [place], [place] [address]    Special Requests   Final    BOTTLES DRAWN AEROBIC ONLY Blood Culture results may not be optimal due to an inadequate volume of blood received in culture bottles Performed at [facility], [address], [place], [place] [address]    Culture   Final    NO GROWTH 3 DAYS Performed at [facility], [address], [place], [place] [address]    Report Status PENDING  Incomplete     Labs: Basic Metabolic Panel: Recent Labs  Lab 10/04/20 1405 10/04/20 1701 10/05/20 0453 10/06/20 0452 10/07/20 0504  NA 131*  --  133* 135 136  K 3.0*  --  3.6 2.9* 3.2*  CL 95*  --  100 98 98  CO2 21*  --  23 26 28   GLUCOSE 101*  --  128* 115* 109*  BUN 30*  --  24* 10 8  CREATININE 1.07*  --  0.78 0.58 0.56  CALCIUM 9.0  --  8.9 8.5* 8.7*  MG  --  1.9  --  1.4* 1.7   Liver Function Tests: Recent Labs  Lab 10/04/20 1405 10/05/20 0453  AST 30 23  ALT 24 20  ALKPHOS 109 98  BILITOT 0.8 0.6  PROT 8.8*  7.6  ALBUMIN 3.9 3.5   No results for input(s): LIPASE, AMYLASE in the last 168 hours. No results for input(s): AMMONIA in the last 168 hours. CBC: Recent Labs  Lab 10/04/20 1405 10/05/20 0453 10/06/20 0452 10/07/20 0504  WBC 13.0* 10.3 7.5 7.3  NEUTROABS 10.2*  --  5.1 4.9  HGB 13.0 11.7* 11.3* 12.4  HCT 38.7 36.3 34.4* 37.8  MCV 93.9 96.8 95.8 95.5  PLT 238 207 225 263   Cardiac Enzymes: No results for input(s): CKTOTAL, CKMB, CKMBINDEX, TROPONINI in the last 168 hours. BNP: BNP (last 3 results) Recent Labs    01/27/20 2037 02/23/20 2103  BNP 255.8* 231.5*    ProBNP (last 3 results) No results for input(s): PROBNP in the last 8760 hours.  CBG: No results for input(s): GLUCAP in the last 168 hours.     Signed:  Ramiro Harvest MD.  [facility] 10/07/2020, 3:54 PM

## 2020-10-09 ENCOUNTER — Ambulatory Visit: Payer: Medicare Other | Admitting: Family Medicine

## 2020-10-09 LAB — CULTURE, BLOOD (ROUTINE X 2)
Culture: NO GROWTH
Culture: NO GROWTH
Special Requests: ADEQUATE

## 2020-10-10 NOTE — Progress Notes (Signed)
Patient has been calling [facility] office for K prescription to be called in at her pharmacy. She reports cramps in legs and hands.  I have called in Kdur 20 meq once daily for 3 days at [facility] pharmacy of her. Pharmacist will notify her for pick up when ready.

## 2020-10-12 DIAGNOSIS — Z09 Encounter for follow-up examination after completed treatment for conditions other than malignant neoplasm: Secondary | ICD-10-CM | POA: Diagnosis not present

## 2020-10-12 DIAGNOSIS — Z79899 Other long term (current) drug therapy: Secondary | ICD-10-CM | POA: Diagnosis not present

## 2020-10-12 DIAGNOSIS — M5136 Other intervertebral disc degeneration, lumbar region: Secondary | ICD-10-CM | POA: Diagnosis not present

## 2020-10-12 DIAGNOSIS — G894 Chronic pain syndrome: Secondary | ICD-10-CM | POA: Diagnosis not present

## 2020-10-26 ENCOUNTER — Encounter: Payer: Self-pay | Admitting: Family Medicine

## 2020-10-26 ENCOUNTER — Other Ambulatory Visit: Payer: Self-pay

## 2020-10-26 ENCOUNTER — Ambulatory Visit (INDEPENDENT_AMBULATORY_CARE_PROVIDER_SITE_OTHER): Payer: Medicare Other | Admitting: Family Medicine

## 2020-10-26 VITALS — BP 141/78 | HR 103 | Ht 66.0 in | Wt 200.8 lb

## 2020-10-26 DIAGNOSIS — R739 Hyperglycemia, unspecified: Secondary | ICD-10-CM | POA: Diagnosis not present

## 2020-10-26 DIAGNOSIS — Z7689 Persons encountering health services in other specified circumstances: Secondary | ICD-10-CM | POA: Diagnosis not present

## 2020-10-26 DIAGNOSIS — E559 Vitamin D deficiency, unspecified: Secondary | ICD-10-CM | POA: Diagnosis not present

## 2020-10-26 DIAGNOSIS — E876 Hypokalemia: Secondary | ICD-10-CM | POA: Diagnosis not present

## 2020-10-26 DIAGNOSIS — Z1231 Encounter for screening mammogram for malignant neoplasm of breast: Secondary | ICD-10-CM

## 2020-10-26 DIAGNOSIS — R5382 Chronic fatigue, unspecified: Secondary | ICD-10-CM | POA: Diagnosis not present

## 2020-10-26 DIAGNOSIS — K5909 Other constipation: Secondary | ICD-10-CM

## 2020-10-26 NOTE — Progress Notes (Signed)
Office Visit Note   Patient: Stephanie Carrillo           Date of Birth: Apr 23, 1956           MRN: [id number] Visit Date: 10/26/2020 Requested by: Carmel Sacramento, NP [address] [place],  [place] [address] PCP: Lavada Mesi, MD  Subjective: Chief Complaint  Patient presents with  . establish primary care    HPI: She is here to establish care.  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  She has been hospitalized 3 times this year and she decided she needed to establish with a PCP.  Her most recent hospitalization was for a bladder infection with sepsis.  She is doing well now.  In the hospital she had some lab abnormalities that need to be rechecked.  She has a history of chronic fatigue.  She has had troubles with this for at least several years.  She has chronic constipation as well.  She uses the bathroom about once every 8 days.  She has vitamin D deficiency and is not being treated for it currently.  She is due for screening mammogram.  She had fecal occult blood testing recently, she has not had a colonoscopy.  Family history was updated.              ROS:   All other systems were reviewed and are negative.  Objective: Vital Signs: BP (!) 141/78   Pulse (!) 103   Ht 5\' 6"  (1.676 m)   Wt 200 lb 12.8 oz (91.1 kg)   BMI 32.41 kg/m   Physical Exam:  General:  Alert and oriented, in no acute distress. Pulm:  Breathing unlabored. Psy:  Normal mood, congruent affect. Skin: No rash or suspicious lesions. HEENT:  [place]/AT, PERRLA, EOM Full, no nystagmus.  Funduscopic examination within normal limits.  No conjunctival erythema.  Tympanic membranes are pearly gray with normal landmarks.  External ear canals are normal.  Nasal passages are clear.  Oropharynx is clear.  No significant lymphadenopathy.  No thyromegaly or nodules.  2+ carotid pulses without bruits. CV: Regular rate and rhythm without murmurs, rubs, or gallops.  No peripheral edema.  2+ radial and posterior tibial  pulses. Lungs: Clear to auscultation throughout with no wheezing or areas of consolidation. Extremities: 2+ upper and lower DTRs, no nail deformities.   Imaging: No results found.  Assessment & Plan: 1.  Recently status post hospitalization, with lab abnormalities including hypokalemia, hypomagnesemia and hyperglycemia -Recheck these labs today.  2.  Chronic fatigue and chronic constipation -Labs to evaluate these things.  3.  History of bladder infection with sepsis, currently doing better.  Under management of the urology.  4.  Vitamin D deficiency -Recheck levels today and treat accordingly.  5.  Breast cancer screening -Mammogram ordered.       Procedures: No procedures performed        PMFS History: Patient Active Problem List   Diagnosis Date Noted  . AKI (acute kidney injury) (HCC)   . Fall   . UTI (urinary tract infection) 10/04/2020  . Nausea vomiting and diarrhea   . Acute metabolic encephalopathy 02/23/2020  . Benzodiazepine withdrawal (HCC) 02/01/2020  . Hypokalemia   . Tachycardia   . Altered mental state 01/27/2020  . Anxiety and depression 09/23/2018  . Insomnia 09/23/2018  . Chronic pain syndrome 09/23/2018   Past Medical History:  Diagnosis Date  . Anxiety   . Depression   . Hypertension   . Insomnia   .  Meningitis   . Recurrent UTI   . Seizures (HCC)   . Substance abuse (HCC)     Family History  Problem Relation Age of Onset  . Multiple sclerosis Mother   . Cancer Mother        Possibly breast or colon mets to brain  . Cancer Father   . Lung cancer Father   . Heart disease Father   . Cancer Brother   . Testicular cancer Brother   . Prostate cancer Brother   . Skin cancer Brother   . Colon polyps Brother   . Colon polyps Brother     Past Surgical History:  Procedure Laterality Date  . BACK SURGERY    . CERVICAL DISC SURGERY    . FRACTURE SURGERY N/A    Phreesia 05/07/2020  . SPINE SURGERY N/A    Phreesia 05/07/2020    Social History   Occupational History  . Not on file  Tobacco Use  . Smoking status: Former Games developer  . Smokeless tobacco: Never Used  Vaping Use  . Vaping Use: Never used  Substance and Sexual Activity  . Alcohol use: Not Currently  . Drug use: Not Currently    Comment: opiates -clean 6 months  . Sexual activity: Not on file

## 2020-10-27 LAB — CBC WITH DIFFERENTIAL/PLATELET
Absolute Monocytes: 634 cells/uL (ref 200–950)
Basophils Absolute: 102 cells/uL (ref 0–200)
Basophils Relative: 1.6 %
Eosinophils Absolute: 32 cells/uL (ref 15–500)
Eosinophils Relative: 0.5 %
HCT: 43.4 % (ref 35.0–45.0)
Hemoglobin: 14.3 g/dL (ref 11.7–15.5)
Lymphs Abs: 1901 cells/uL (ref 850–3900)
MCH: 30.5 pg (ref 27.0–33.0)
MCHC: 32.9 g/dL (ref 32.0–36.0)
MCV: 92.5 fL (ref 80.0–100.0)
MPV: 10.1 fL (ref 7.5–12.5)
Monocytes Relative: 9.9 %
Neutro Abs: 3731 cells/uL (ref 1500–7800)
Neutrophils Relative %: 58.3 %
Platelets: 302 10*3/uL (ref 140–400)
RBC: 4.69 10*6/uL (ref 3.80–5.10)
RDW: 12.5 % (ref 11.0–15.0)
Total Lymphocyte: 29.7 %
WBC: 6.4 10*3/uL (ref 3.8–10.8)

## 2020-10-27 LAB — HEMOGLOBIN A1C
Hgb A1c MFr Bld: 5.1 % of total Hgb (ref <5.7)
Mean Plasma Glucose: 100 mg/dL
eAG (mmol/L): 5.5 mmol/L

## 2020-10-27 LAB — THYROID PANEL WITH TSH
Free Thyroxine Index: 1.7 (ref 1.4–3.8)
T3 Uptake: 29 % (ref 22–35)
T4, Total: 6 ug/dL (ref 5.1–11.9)
TSH: 2.97 mIU/L (ref 0.40–4.50)

## 2020-10-27 LAB — LIPID PANEL
Cholesterol: 241 mg/dL — ABNORMAL HIGH (ref <200)
HDL: 43 mg/dL — ABNORMAL LOW (ref >=50)
Non-HDL Cholesterol (Calc): 198 mg/dL (calc) — ABNORMAL HIGH (ref <130)
Total CHOL/HDL Ratio: 5.6 (calc) — ABNORMAL HIGH (ref <5.0)
Triglycerides: 520 mg/dL — ABNORMAL HIGH (ref <150)

## 2020-10-27 LAB — COMPREHENSIVE METABOLIC PANEL
AG Ratio: 1.1 (calc) (ref 1.0–2.5)
ALT: 24 U/L (ref 6–29)
AST: 31 U/L (ref 10–35)
Albumin: 4.6 g/dL (ref 3.6–5.1)
Alkaline phosphatase (APISO): 155 U/L — ABNORMAL HIGH (ref 37–153)
BUN: 14 mg/dL (ref 7–25)
CO2: 25 mmol/L (ref 20–32)
Calcium: 10.1 mg/dL (ref 8.6–10.4)
Chloride: 101 mmol/L (ref 98–110)
Creat: 0.76 mg/dL (ref 0.50–0.99)
Globulin: 4.3 g/dL (calc) — ABNORMAL HIGH (ref 1.9–3.7)
Glucose, Bld: 94 mg/dL (ref 65–99)
Potassium: 3.9 mmol/L (ref 3.5–5.3)
Sodium: 138 mmol/L (ref 135–146)
Total Bilirubin: 0.4 mg/dL (ref 0.2–1.2)
Total Protein: 8.9 g/dL — ABNORMAL HIGH (ref 6.1–8.1)

## 2020-10-27 LAB — HIGH SENSITIVITY CRP: hs-CRP: 2.4 mg/L

## 2020-10-27 LAB — VITAMIN D 25 HYDROXY (VIT D DEFICIENCY, FRACTURES): Vit D, 25-Hydroxy: 49 ng/mL (ref 30–100)

## 2020-10-27 LAB — MAGNESIUM: Magnesium: 2.2 mg/dL (ref 1.5–2.5)

## 2020-10-27 LAB — FERRITIN: Ferritin: 129 ng/mL (ref 16–288)

## 2020-10-29 ENCOUNTER — Telehealth: Payer: Self-pay | Admitting: Family Medicine

## 2020-10-29 NOTE — Telephone Encounter (Signed)
Labs are notable for the following:  CBC and ferritin levels are normal, no sign of iron deficiency.  Magnesium, potassium, blood sugar and other values that were recently abnormal have normalized.  Alkaline phosphatase is borderline elevated, not at a worrisome level.  We should recheck in a few months.  Hemoglobin A1c is normal.  Thyroid panel is normal.  Lipid panel is abnormal with triglycerides of 520 and total cholesterol of 241.  It is important to exercise regularly and to minimize dietary intake of breads, pastas, cereals, sugars and sweets.  This will usually bring these values in normal range.  We should recheck in 6 to 8 weeks to be sure the triglyceride level has returned to normal because high triglycerides can lead to pancreatitis.  All else looks good.

## 2020-11-01 DIAGNOSIS — R3914 Feeling of incomplete bladder emptying: Secondary | ICD-10-CM | POA: Diagnosis not present

## 2020-11-01 DIAGNOSIS — N3020 Other chronic cystitis without hematuria: Secondary | ICD-10-CM | POA: Diagnosis not present

## 2020-11-09 DIAGNOSIS — E7800 Pure hypercholesterolemia, unspecified: Secondary | ICD-10-CM | POA: Diagnosis not present

## 2020-11-09 DIAGNOSIS — I10 Essential (primary) hypertension: Secondary | ICD-10-CM | POA: Diagnosis not present

## 2020-11-09 DIAGNOSIS — Z789 Other specified health status: Secondary | ICD-10-CM | POA: Diagnosis not present

## 2020-11-14 DIAGNOSIS — M5136 Other intervertebral disc degeneration, lumbar region: Secondary | ICD-10-CM | POA: Diagnosis not present

## 2020-11-14 DIAGNOSIS — G894 Chronic pain syndrome: Secondary | ICD-10-CM | POA: Diagnosis not present

## 2020-11-14 DIAGNOSIS — Z79899 Other long term (current) drug therapy: Secondary | ICD-10-CM | POA: Diagnosis not present

## 2020-11-23 ENCOUNTER — Ambulatory Visit: Payer: Medicare Other | Admitting: Family Medicine

## 2020-11-26 ENCOUNTER — Encounter: Payer: Self-pay | Admitting: Family Medicine

## 2020-11-26 ENCOUNTER — Other Ambulatory Visit: Payer: Self-pay

## 2020-11-26 ENCOUNTER — Ambulatory Visit (INDEPENDENT_AMBULATORY_CARE_PROVIDER_SITE_OTHER): Payer: Medicare Other | Admitting: Family Medicine

## 2020-11-26 VITALS — BP 109/74 | HR 92

## 2020-11-26 DIAGNOSIS — F419 Anxiety disorder, unspecified: Secondary | ICD-10-CM

## 2020-11-26 DIAGNOSIS — M2550 Pain in unspecified joint: Secondary | ICD-10-CM

## 2020-11-26 DIAGNOSIS — R5382 Chronic fatigue, unspecified: Secondary | ICD-10-CM

## 2020-11-26 MED ORDER — CLONAZEPAM 0.5 MG PO TABS: 0.5000 mg | ORAL_TABLET | Freq: Three times a day (TID) | ORAL | 1 refills | Status: DC | PRN

## 2020-11-26 MED ORDER — DULOXETINE HCL 20 MG PO CPEP: ORAL_CAPSULE | ORAL | 3 refills | Status: DC

## 2020-11-26 NOTE — Progress Notes (Signed)
Office Visit Note   Patient: Stephanie Carrillo           Date of Birth: 16-Mar-1956           MRN: [id number] Visit Date: 11/26/2020 Requested by: Lavada Mesi, MD [address] [place],  [place] [address] PCP: Lavada Mesi, MD  Subjective: Chief Complaint  Patient presents with  . Other    Requests referral to pain management (transitioning from [place]). Multiple joint pains and fatigue.    HPI: She is here with a couple concerns.  She has chronic pain and has been on buprenorphine per [facility] clinic, but she would like to switch to a different pain clinic.  She also has been on Cymbalta 60 mg daily but does not feel that it has made any difference in her pain.  She would like to know how to taper off it.  She struggles with chronic fatigue.  She goes to bed at about 8:00 every night and does not fall asleep, she usually gets up and about 6 or 7 in the morning, eats breakfast, and then is able to fall asleep for a little bit.  She does not take any naps during the daytime.  Takes klonopin TID for anxiety.               ROS:   All other systems were reviewed and are negative.  Objective: Vital Signs: BP 109/74   Pulse 92   Physical Exam:  General:  Alert and oriented, in no acute distress. Pulm:  Breathing unlabored. Psy:  Normal mood, congruent affect.    Imaging: No results found.  Assessment & Plan: 1.  Chronic pain syndrome. -Referral to pain clinic.  Prescription for Cymbalta 20 mg tablets, with tapering instructions.  2.  Chronic fatigue -Trial of melatonin, magnesium, phosphatidylserine and theanine.  3.  Anxiety - Refilled klonopin.  We discussed that pain clinic may want her off this.      Procedures: No procedures performed        PMFS History: Patient Active Problem List   Diagnosis Date Noted  . AKI (acute kidney injury) (HCC)   . Fall   . UTI (urinary tract infection) 10/04/2020  . Nausea vomiting and diarrhea   . Acute metabolic  encephalopathy 02/23/2020  . Benzodiazepine withdrawal (HCC) 02/01/2020  . Hypokalemia   . Tachycardia   . Altered mental state 01/27/2020  . Anxiety and depression 09/23/2018  . Insomnia 09/23/2018  . Chronic pain syndrome 09/23/2018   Past Medical History:  Diagnosis Date  . Anxiety   . Depression   . Hypertension   . Insomnia   . Meningitis   . Recurrent UTI   . Seizures (HCC)   . Substance abuse (HCC)     Family History  Problem Relation Age of Onset  . Multiple sclerosis Mother   . Cancer Mother        Possibly breast or colon mets to brain  . Cancer Father   . Lung cancer Father   . Heart disease Father   . Cancer Brother   . Testicular cancer Brother   . Prostate cancer Brother   . Skin cancer Brother   . Colon polyps Brother   . Colon polyps Brother     Past Surgical History:  Procedure Laterality Date  . BACK SURGERY    . CERVICAL DISC SURGERY    . FRACTURE SURGERY N/A    Phreesia 05/07/2020  . SPINE SURGERY N/A    Phreesia  05/07/2020   Social History   Occupational History  . Not on file  Tobacco Use  . Smoking status: Former Games developer  . Smokeless tobacco: Never Used  Vaping Use  . Vaping Use: Never used  Substance and Sexual Activity  . Alcohol use: Not Currently  . Drug use: Not Currently    Comment: opiates -clean 6 months  . Sexual activity: Not on file

## 2020-11-26 NOTE — Patient Instructions (Signed)
    For sleep, try this combination:  L-Theanine:  100-200 mg one hour before bed time  Phosphatidylserine:  300-600 mg one hour before bed time  Melatonin:  2-6 mg one hour before bed time  Magnesium:  400 mg one hour before bed time    For aches and pains, try these:  Glucosamine Sulfate 1,000 mg twice daily  Turmeric 500 mg twice daily

## 2020-12-04 ENCOUNTER — Encounter: Payer: Self-pay | Admitting: Family Medicine

## 2020-12-06 ENCOUNTER — Encounter: Payer: Self-pay | Admitting: Physical Medicine and Rehabilitation

## 2020-12-10 DIAGNOSIS — M1712 Unilateral primary osteoarthritis, left knee: Secondary | ICD-10-CM | POA: Diagnosis not present

## 2020-12-10 DIAGNOSIS — Z789 Other specified health status: Secondary | ICD-10-CM | POA: Diagnosis not present

## 2020-12-10 DIAGNOSIS — E7800 Pure hypercholesterolemia, unspecified: Secondary | ICD-10-CM | POA: Diagnosis not present

## 2020-12-18 DIAGNOSIS — G894 Chronic pain syndrome: Secondary | ICD-10-CM | POA: Diagnosis not present

## 2020-12-18 DIAGNOSIS — M5136 Other intervertebral disc degeneration, lumbar region: Secondary | ICD-10-CM | POA: Diagnosis not present

## 2020-12-18 DIAGNOSIS — Z79899 Other long term (current) drug therapy: Secondary | ICD-10-CM | POA: Diagnosis not present

## 2020-12-18 DIAGNOSIS — R300 Dysuria: Secondary | ICD-10-CM | POA: Diagnosis not present

## 2020-12-25 DIAGNOSIS — R3914 Feeling of incomplete bladder emptying: Secondary | ICD-10-CM | POA: Diagnosis not present

## 2020-12-25 DIAGNOSIS — R8271 Bacteriuria: Secondary | ICD-10-CM | POA: Diagnosis not present

## 2020-12-25 DIAGNOSIS — N3020 Other chronic cystitis without hematuria: Secondary | ICD-10-CM | POA: Diagnosis not present

## 2020-12-25 DIAGNOSIS — N3000 Acute cystitis without hematuria: Secondary | ICD-10-CM | POA: Diagnosis not present

## 2021-01-03 ENCOUNTER — Ambulatory Visit (HOSPITAL_BASED_OUTPATIENT_CLINIC_OR_DEPARTMENT_OTHER): Payer: Medicare Other | Admitting: Family Medicine

## 2021-01-04 ENCOUNTER — Encounter: Payer: Self-pay | Admitting: Family Medicine

## 2021-01-07 DIAGNOSIS — Z789 Other specified health status: Secondary | ICD-10-CM | POA: Diagnosis not present

## 2021-01-07 DIAGNOSIS — F341 Dysthymic disorder: Secondary | ICD-10-CM | POA: Diagnosis not present

## 2021-01-07 DIAGNOSIS — I10 Essential (primary) hypertension: Secondary | ICD-10-CM | POA: Diagnosis not present

## 2021-01-07 MED ORDER — CLONAZEPAM 0.5 MG PO TABS: 0.5000 mg | ORAL_TABLET | Freq: Three times a day (TID) | ORAL | 1 refills | Status: DC | PRN

## 2021-01-07 MED ORDER — DULOXETINE HCL 60 MG PO CPEP: 60.0000 mg | ORAL_CAPSULE | Freq: Every day | ORAL | 1 refills | Status: DC

## 2021-01-14 DIAGNOSIS — R3914 Feeling of incomplete bladder emptying: Secondary | ICD-10-CM | POA: Diagnosis not present

## 2021-01-14 DIAGNOSIS — N3020 Other chronic cystitis without hematuria: Secondary | ICD-10-CM | POA: Diagnosis not present

## 2021-01-17 DIAGNOSIS — G894 Chronic pain syndrome: Secondary | ICD-10-CM | POA: Diagnosis not present

## 2021-01-17 DIAGNOSIS — M5136 Other intervertebral disc degeneration, lumbar region: Secondary | ICD-10-CM | POA: Diagnosis not present

## 2021-01-17 DIAGNOSIS — Z79899 Other long term (current) drug therapy: Secondary | ICD-10-CM | POA: Diagnosis not present

## 2021-01-17 DIAGNOSIS — Z6827 Body mass index (BMI) 27.0-27.9, adult: Secondary | ICD-10-CM | POA: Diagnosis not present

## 2021-01-21 ENCOUNTER — Ambulatory Visit (HOSPITAL_BASED_OUTPATIENT_CLINIC_OR_DEPARTMENT_OTHER): Payer: Medicare Other | Admitting: Family Medicine

## 2021-01-31 ENCOUNTER — Other Ambulatory Visit: Payer: Self-pay | Admitting: Family Medicine

## 2021-01-31 MED ORDER — TIZANIDINE HCL 4 MG PO TABS: 4.0000 mg | ORAL_TABLET | Freq: Four times a day (QID) | ORAL | 0 refills | Status: DC | PRN

## 2021-01-31 MED ORDER — TIZANIDINE HCL 4 MG PO TABS: 4.0000 mg | ORAL_TABLET | Freq: Three times a day (TID) | ORAL | 3 refills | Status: DC | PRN

## 2021-02-05 ENCOUNTER — Other Ambulatory Visit: Payer: Self-pay

## 2021-02-05 ENCOUNTER — Encounter: Payer: Self-pay | Admitting: Physical Medicine & Rehabilitation

## 2021-02-05 ENCOUNTER — Encounter: Payer: Medicare Other | Attending: Physical Medicine & Rehabilitation | Admitting: Physical Medicine & Rehabilitation

## 2021-02-05 VITALS — BP 110/77 | HR 86 | Temp 97.9°F | Ht 66.0 in | Wt 203.8 lb

## 2021-02-05 DIAGNOSIS — G894 Chronic pain syndrome: Secondary | ICD-10-CM | POA: Insufficient documentation

## 2021-02-05 NOTE — Progress Notes (Signed)
Subjective:    Patient ID: Stephanie Carrillo, female    DOB: 08-Aug-1955, 65 y.o.   MRN: [id number]  HPI   Left scapular pain left forearm pain left hand pain bilateral sciatica  Pain management in [place] included radiofrequency ablation cervical region lumbar and cervical epidural injections from 2016-2018 Narcotic analgesics Percocet  The patient states that she became addicted to the narcotics and was placed on buprenorphine  Current pain management [facility] 2019 through 2022 buprenorphine, Subutex 2 mcg sublingual every 6 hours  Hospitalizations 2022 rhabdomyolysis UTI, serotonin syndrome UTI, UTI and sepsis  Past surgical history 2000 fusion C5-C7 right iliac crest graft 2006 fracture right ankle surgical repair 2008 graft failure C6-7 surgical intervention donor graft 2012 fusion L1-L2 required dural repair, had meningitis.  Vent dependent respiratory failure x8 days 2014 through 2017 multiple abscess formation at site of procedure requiring antibiotics via PICC line History of Lyme's disease x2 mononucleosis x3  Pain Inventory Average Pain 6 Pain Right Now 7 My pain is constant, burning, and aching  In the last 24 hours, has pain interfered with the following? General activity 4 Relation with others 2 Enjoyment of life 2 What TIME of day is your pain at its worst? varies Sleep (in general) Poor  Pain is worse with: walking, sitting, and some activites Pain improves with: medication and TENS Relief from Meds: 5  walk without assistance how many minutes can you walk? 15 ability to climb steps?  yes do you drive?  no transfers alone  disabled: date disabled 10/2017 retired  bladder control problems weakness numbness tingling anxiety  N/a  N/a    Family History  Problem Relation Age of Onset   Multiple sclerosis Mother    Cancer Mother        Possibly breast or colon mets to brain   Cancer Father    Lung cancer Father    Heart disease  Father    Cancer Brother    Testicular cancer Brother    Prostate cancer Brother    Skin cancer Brother    Colon polyps Brother    Colon polyps Brother    Social History   Socioeconomic History   Marital status: Divorced    Spouse name: Not on file   Number of children: Not on file   Years of education: Not on file   Highest education level: Not on file  Occupational History   Not on file  Tobacco Use   Smoking status: Former    Pack years: 0.00   Smokeless tobacco: Never  Vaping Use   Vaping Use: Never used  Substance and Sexual Activity   Alcohol use: Not Currently   Drug use: Not Currently    Comment: opiates -clean 6 months   Sexual activity: Not on file  Other Topics Concern   Not on file  Social History Narrative   Not on file   Social Determinants of Health   Financial Resource Strain: Not on file  Food Insecurity: Not on file  Transportation Needs: Not on file  Physical Activity: Not on file  Stress: Not on file  Social Connections: Not on file   Past Surgical History:  Procedure Laterality Date   BACK SURGERY     CERVICAL DISC SURGERY     FRACTURE SURGERY N/A    Phreesia 05/07/2020   SPINE SURGERY N/A    Phreesia 05/07/2020   Past Medical History:  Diagnosis Date   Anxiety    Depression  Hypertension    Insomnia    Meningitis    Recurrent UTI    Seizures (HCC)    Substance abuse (HCC)    Temp 97.9 F (36.6 C) (Oral)   Ht 5\' 6"  (1.676 m)   Wt 203 lb 12.8 oz (92.4 kg)   BMI 32.89 kg/m   Opioid Risk Score:   Fall Risk Score:  `1  Depression screen PHQ 2/9  No flowsheet data found.   Review of Systems  Constitutional:  Positive for unexpected weight change.  Eyes: Negative.   Respiratory: Negative.    Cardiovascular: Negative.   Gastrointestinal:  Positive for constipation and diarrhea.  Endocrine: Negative.   Genitourinary: Negative.   Musculoskeletal:  Positive for back pain and gait problem.       Left arm pain  Skin:  Negative.   Neurological:  Positive for weakness and numbness.  Psychiatric/Behavioral:  The patient is nervous/anxious.       Objective:   Physical Exam        Assessment & Plan:  The patient states she has had very good results with buprenorphine Subutex, 2 mcg every 6 hours as prescribed and has not craves opioids and her pain has been well controlled.  We discussed that I do not have experience prescribing this medication.  She states that she will go back to her provider at [facility] for this prescription .  She does not feel like any interventional procedures are needed at this time. I do not have experience prescribing this medication

## 2021-02-13 ENCOUNTER — Encounter (HOSPITAL_BASED_OUTPATIENT_CLINIC_OR_DEPARTMENT_OTHER): Payer: Self-pay

## 2021-02-13 ENCOUNTER — Ambulatory Visit (HOSPITAL_BASED_OUTPATIENT_CLINIC_OR_DEPARTMENT_OTHER): Payer: Medicare Other | Admitting: Family Medicine

## 2021-02-14 DIAGNOSIS — M5136 Other intervertebral disc degeneration, lumbar region: Secondary | ICD-10-CM | POA: Diagnosis not present

## 2021-02-14 DIAGNOSIS — G894 Chronic pain syndrome: Secondary | ICD-10-CM | POA: Diagnosis not present

## 2021-02-14 DIAGNOSIS — Z6827 Body mass index (BMI) 27.0-27.9, adult: Secondary | ICD-10-CM | POA: Diagnosis not present

## 2021-02-14 DIAGNOSIS — Z79899 Other long term (current) drug therapy: Secondary | ICD-10-CM | POA: Diagnosis not present

## 2021-02-22 ENCOUNTER — Encounter: Payer: Self-pay | Admitting: Family Medicine

## 2021-02-22 DIAGNOSIS — Z1231 Encounter for screening mammogram for malignant neoplasm of breast: Secondary | ICD-10-CM

## 2021-02-22 DIAGNOSIS — Z803 Family history of malignant neoplasm of breast: Secondary | ICD-10-CM

## 2021-02-25 ENCOUNTER — Other Ambulatory Visit: Payer: Self-pay | Admitting: Family Medicine

## 2021-02-25 DIAGNOSIS — Z803 Family history of malignant neoplasm of breast: Secondary | ICD-10-CM

## 2021-02-25 DIAGNOSIS — Z1231 Encounter for screening mammogram for malignant neoplasm of breast: Secondary | ICD-10-CM

## 2021-02-26 ENCOUNTER — Other Ambulatory Visit: Payer: Self-pay | Admitting: Family Medicine

## 2021-02-26 DIAGNOSIS — Z1231 Encounter for screening mammogram for malignant neoplasm of breast: Secondary | ICD-10-CM

## 2021-02-26 DIAGNOSIS — N6310 Unspecified lump in the right breast, unspecified quadrant: Secondary | ICD-10-CM

## 2021-02-26 DIAGNOSIS — Z803 Family history of malignant neoplasm of breast: Secondary | ICD-10-CM

## 2021-03-02 DIAGNOSIS — M129 Arthropathy, unspecified: Secondary | ICD-10-CM | POA: Diagnosis not present

## 2021-03-02 DIAGNOSIS — Z6827 Body mass index (BMI) 27.0-27.9, adult: Secondary | ICD-10-CM | POA: Diagnosis not present

## 2021-03-02 DIAGNOSIS — E7800 Pure hypercholesterolemia, unspecified: Secondary | ICD-10-CM | POA: Diagnosis not present

## 2021-03-02 DIAGNOSIS — Z79899 Other long term (current) drug therapy: Secondary | ICD-10-CM | POA: Diagnosis not present

## 2021-03-12 ENCOUNTER — Ambulatory Visit: Payer: Medicare Other | Admitting: Physical Medicine and Rehabilitation

## 2021-03-12 ENCOUNTER — Other Ambulatory Visit: Payer: Self-pay

## 2021-03-12 ENCOUNTER — Emergency Department (HOSPITAL_COMMUNITY): Payer: Medicare Other

## 2021-03-12 ENCOUNTER — Encounter (HOSPITAL_COMMUNITY): Payer: Self-pay

## 2021-03-12 ENCOUNTER — Emergency Department (HOSPITAL_COMMUNITY)
Admission: EM | Admit: 2021-03-12 | Discharge: 2021-03-12 | Disposition: A | Discharge status: Home / Self Care | Payer: Medicare Other | Attending: Emergency Medicine | Admitting: Emergency Medicine

## 2021-03-12 DIAGNOSIS — S5011XA Contusion of right forearm, initial encounter: Secondary | ICD-10-CM | POA: Insufficient documentation

## 2021-03-12 DIAGNOSIS — W108XXA Fall (on) (from) other stairs and steps, initial encounter: Secondary | ICD-10-CM | POA: Insufficient documentation

## 2021-03-12 DIAGNOSIS — I10 Essential (primary) hypertension: Secondary | ICD-10-CM | POA: Insufficient documentation

## 2021-03-12 DIAGNOSIS — S40021A Contusion of right upper arm, initial encounter: Secondary | ICD-10-CM | POA: Diagnosis not present

## 2021-03-12 DIAGNOSIS — M25561 Pain in right knee: Secondary | ICD-10-CM | POA: Diagnosis not present

## 2021-03-12 DIAGNOSIS — M25562 Pain in left knee: Secondary | ICD-10-CM | POA: Diagnosis not present

## 2021-03-12 DIAGNOSIS — S0081XA Abrasion of other part of head, initial encounter: Secondary | ICD-10-CM | POA: Insufficient documentation

## 2021-03-12 DIAGNOSIS — M79631 Pain in right forearm: Secondary | ICD-10-CM | POA: Diagnosis not present

## 2021-03-12 DIAGNOSIS — Z79899 Other long term (current) drug therapy: Secondary | ICD-10-CM | POA: Insufficient documentation

## 2021-03-12 DIAGNOSIS — Z87891 Personal history of nicotine dependence: Secondary | ICD-10-CM | POA: Insufficient documentation

## 2021-03-12 DIAGNOSIS — S81011A Laceration without foreign body, right knee, initial encounter: Secondary | ICD-10-CM | POA: Insufficient documentation

## 2021-03-12 DIAGNOSIS — S40811A Abrasion of right upper arm, initial encounter: Secondary | ICD-10-CM

## 2021-03-12 DIAGNOSIS — M79621 Pain in right upper arm: Secondary | ICD-10-CM | POA: Diagnosis not present

## 2021-03-12 DIAGNOSIS — S8991XA Unspecified injury of right lower leg, initial encounter: Secondary | ICD-10-CM | POA: Diagnosis present

## 2021-03-12 DIAGNOSIS — W19XXXA Unspecified fall, initial encounter: Secondary | ICD-10-CM | POA: Diagnosis not present

## 2021-03-12 DIAGNOSIS — R0902 Hypoxemia: Secondary | ICD-10-CM | POA: Diagnosis not present

## 2021-03-12 IMAGING — CR DG FOREARM 2V*R*
3 series · 3 of 3 positions shown · non-contrast
Comparison: None.

CLINICAL DATA: Fall with pain

EXAM:
RIGHT FOREARM - 2 VIEW

[x forearm ap right]
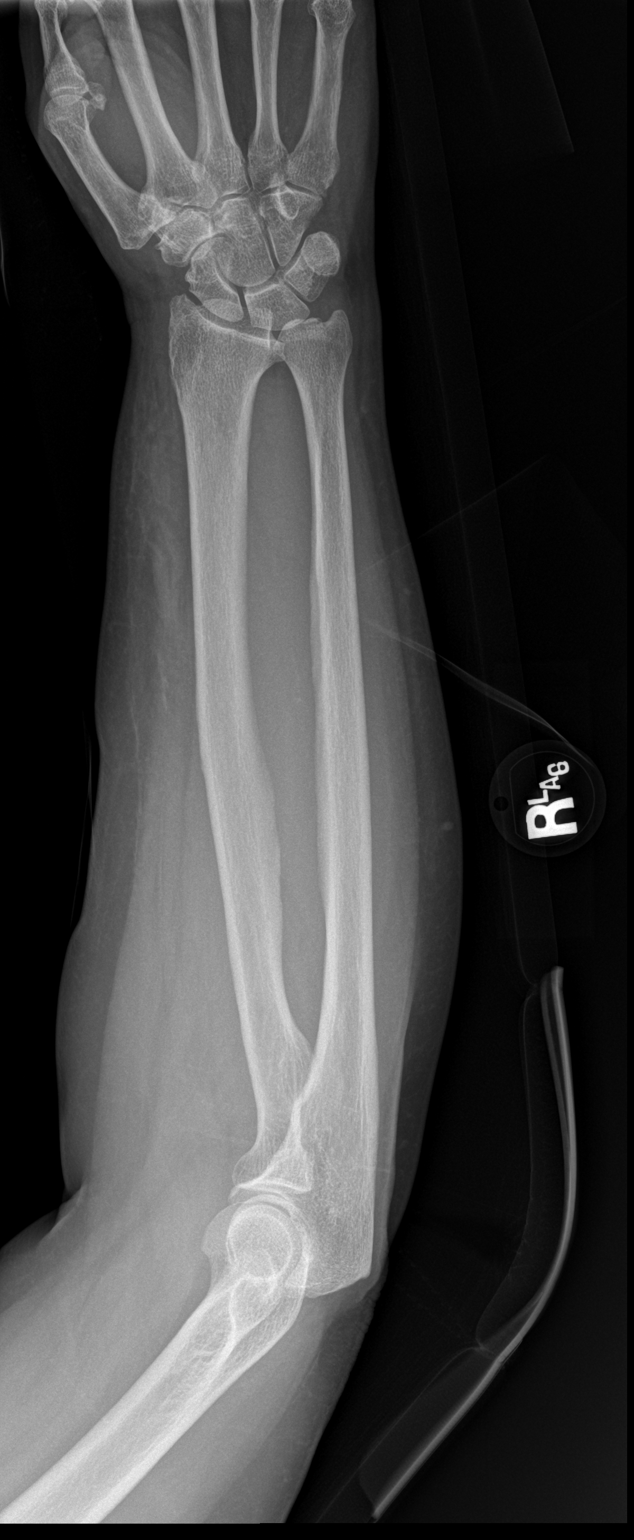

[x forearm lat right (1 of 2)]
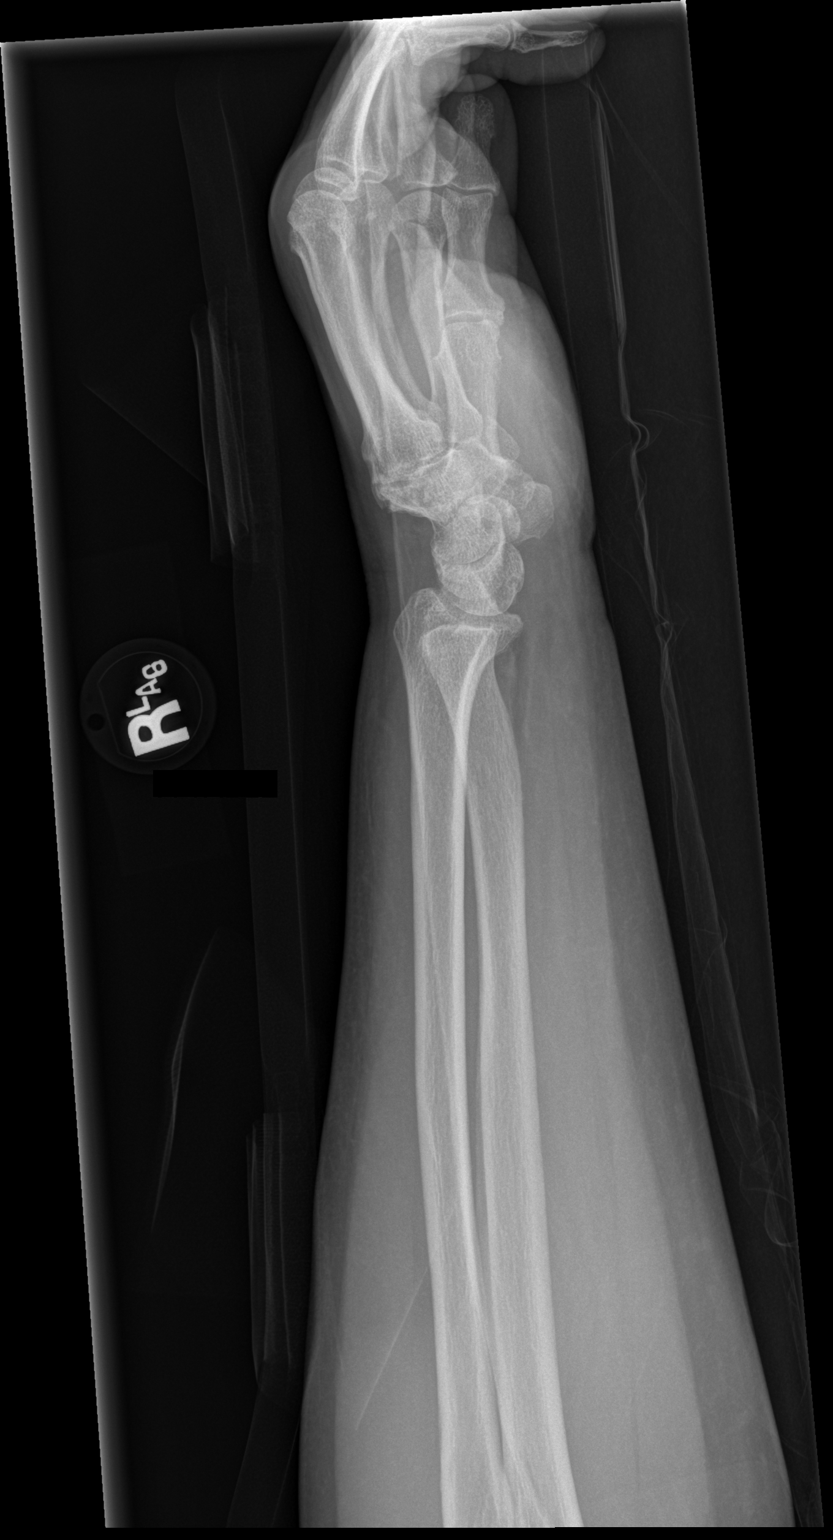

[x forearm lat right (2 of 2)]
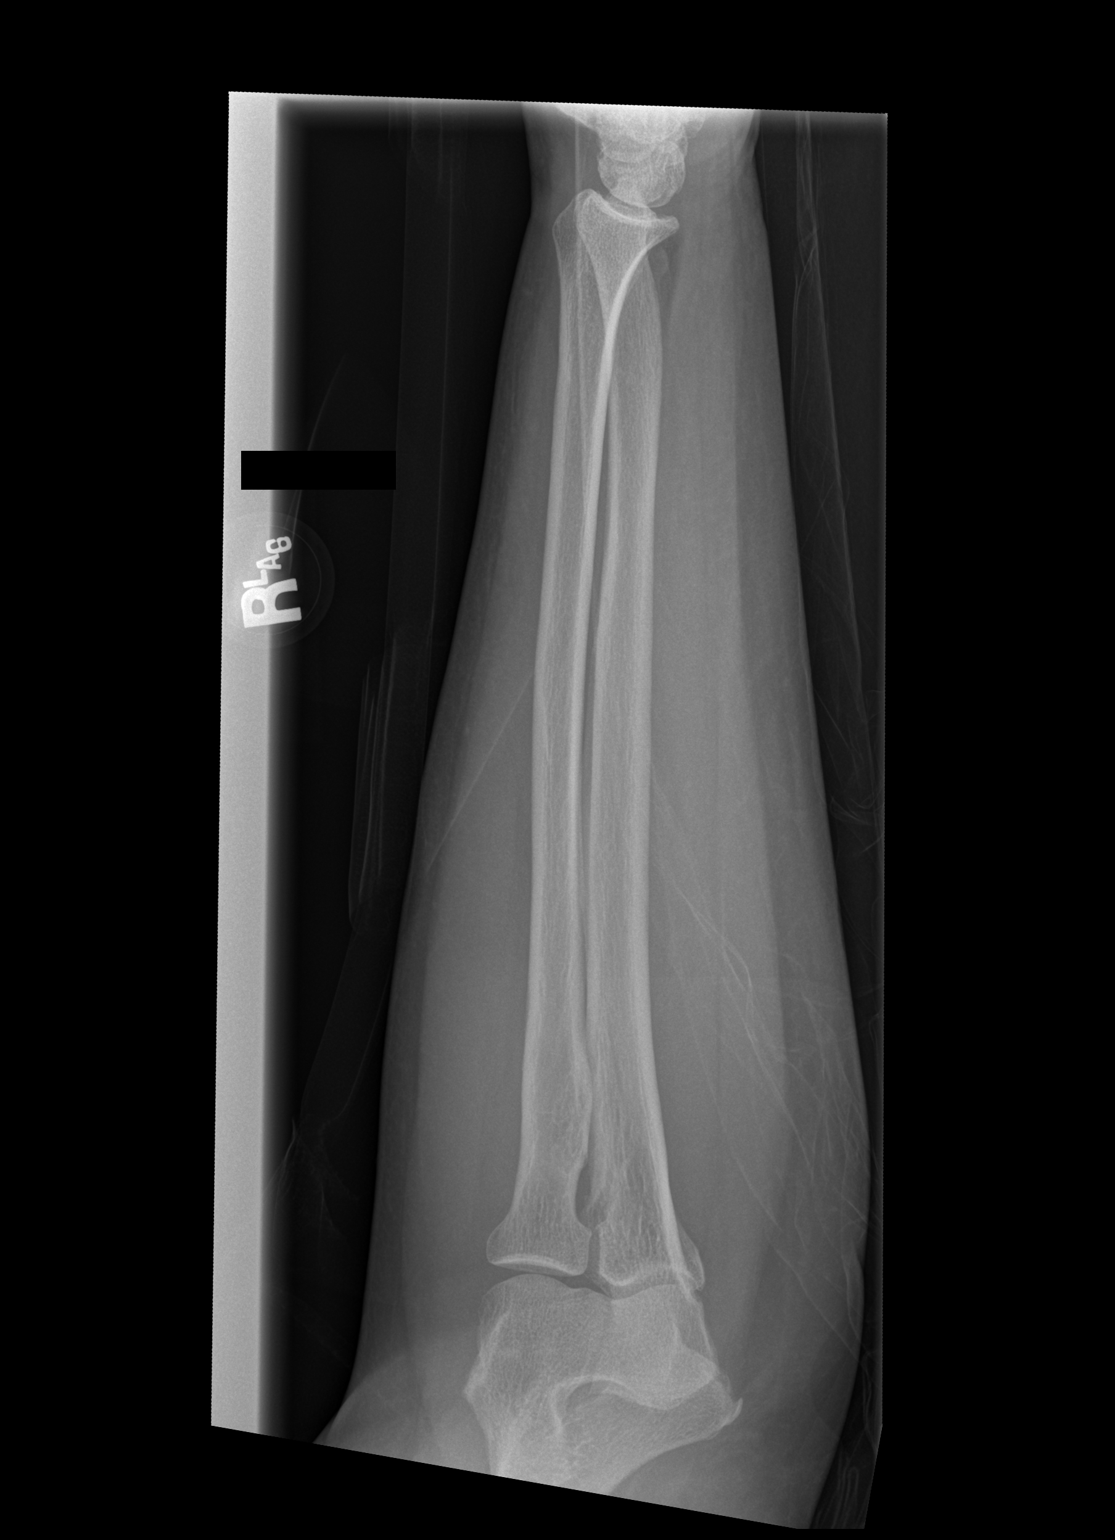

[3 of 3 positions shown; findings below may reference images not displayed]

FINDINGS: There is no evidence of fracture or other focal bone lesions. Soft
tissues are unremarkable.
IMPRESSION: Negative.

## 2021-03-12 IMAGING — CR DG HUMERUS 2V *R*
4 series · 4 of 4 positions shown · non-contrast
Comparison: None.

CLINICAL DATA: Fall with pain at the proximal humerus

EXAM:
RIGHT HUMERUS - 2+ VIEW

[x humerus ap right]
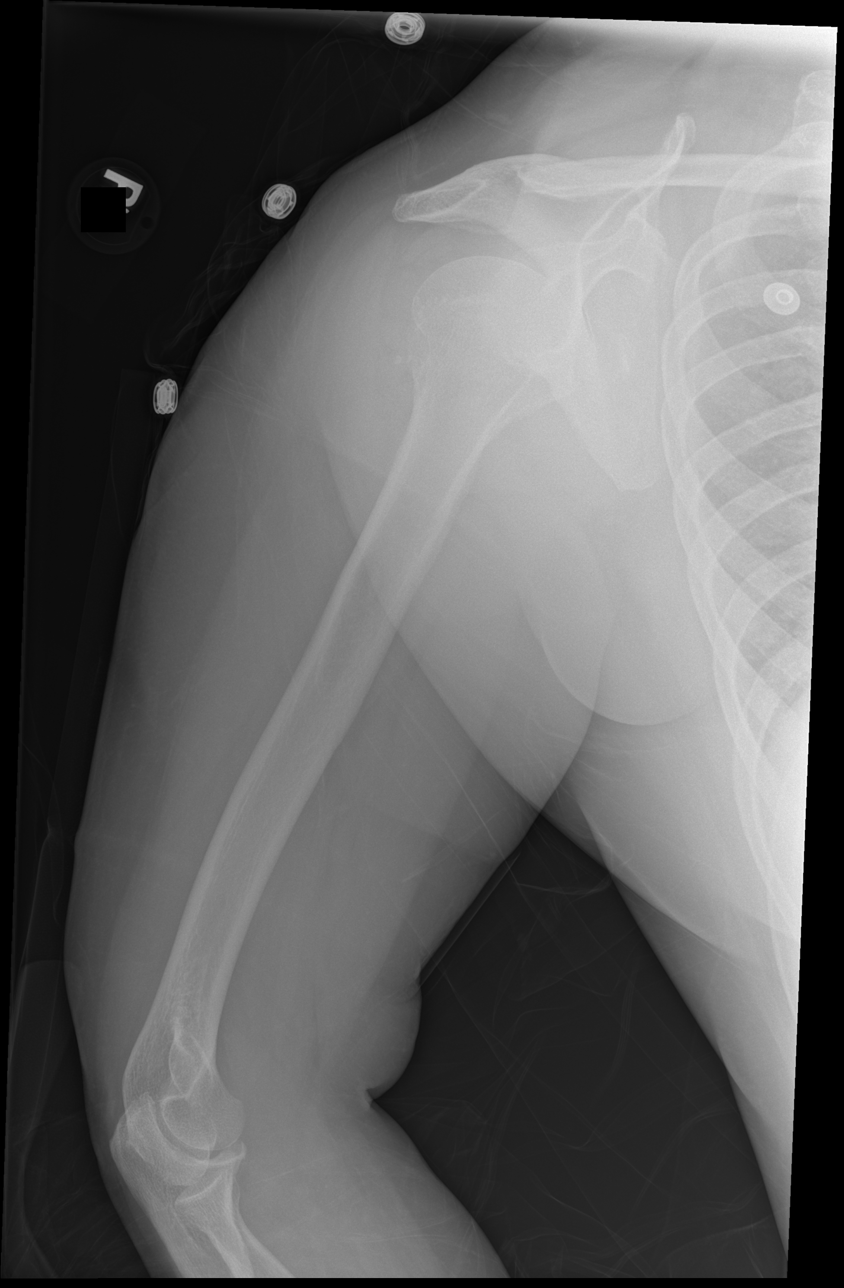

[x humerus lat right (1 of 2)]
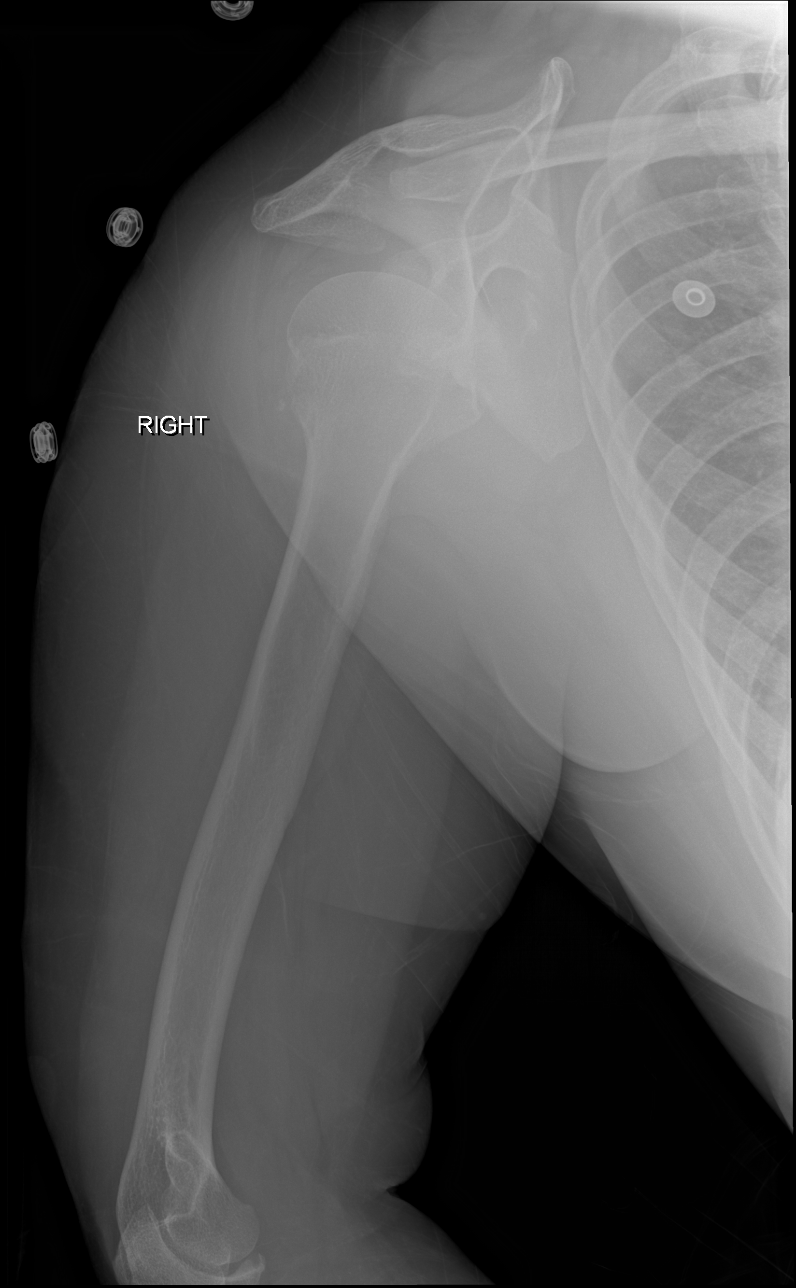

[x humerus lat right (2 of 2)]
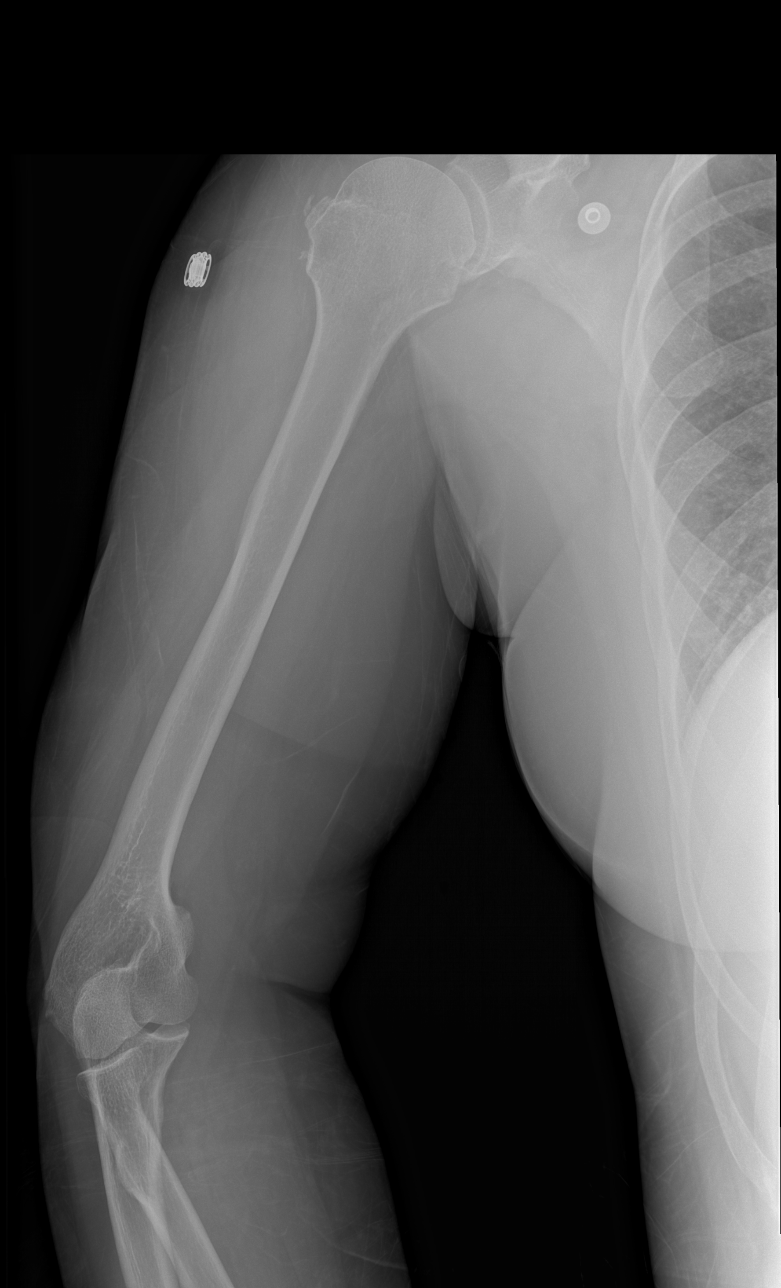

[w trans-thoracic humerus]
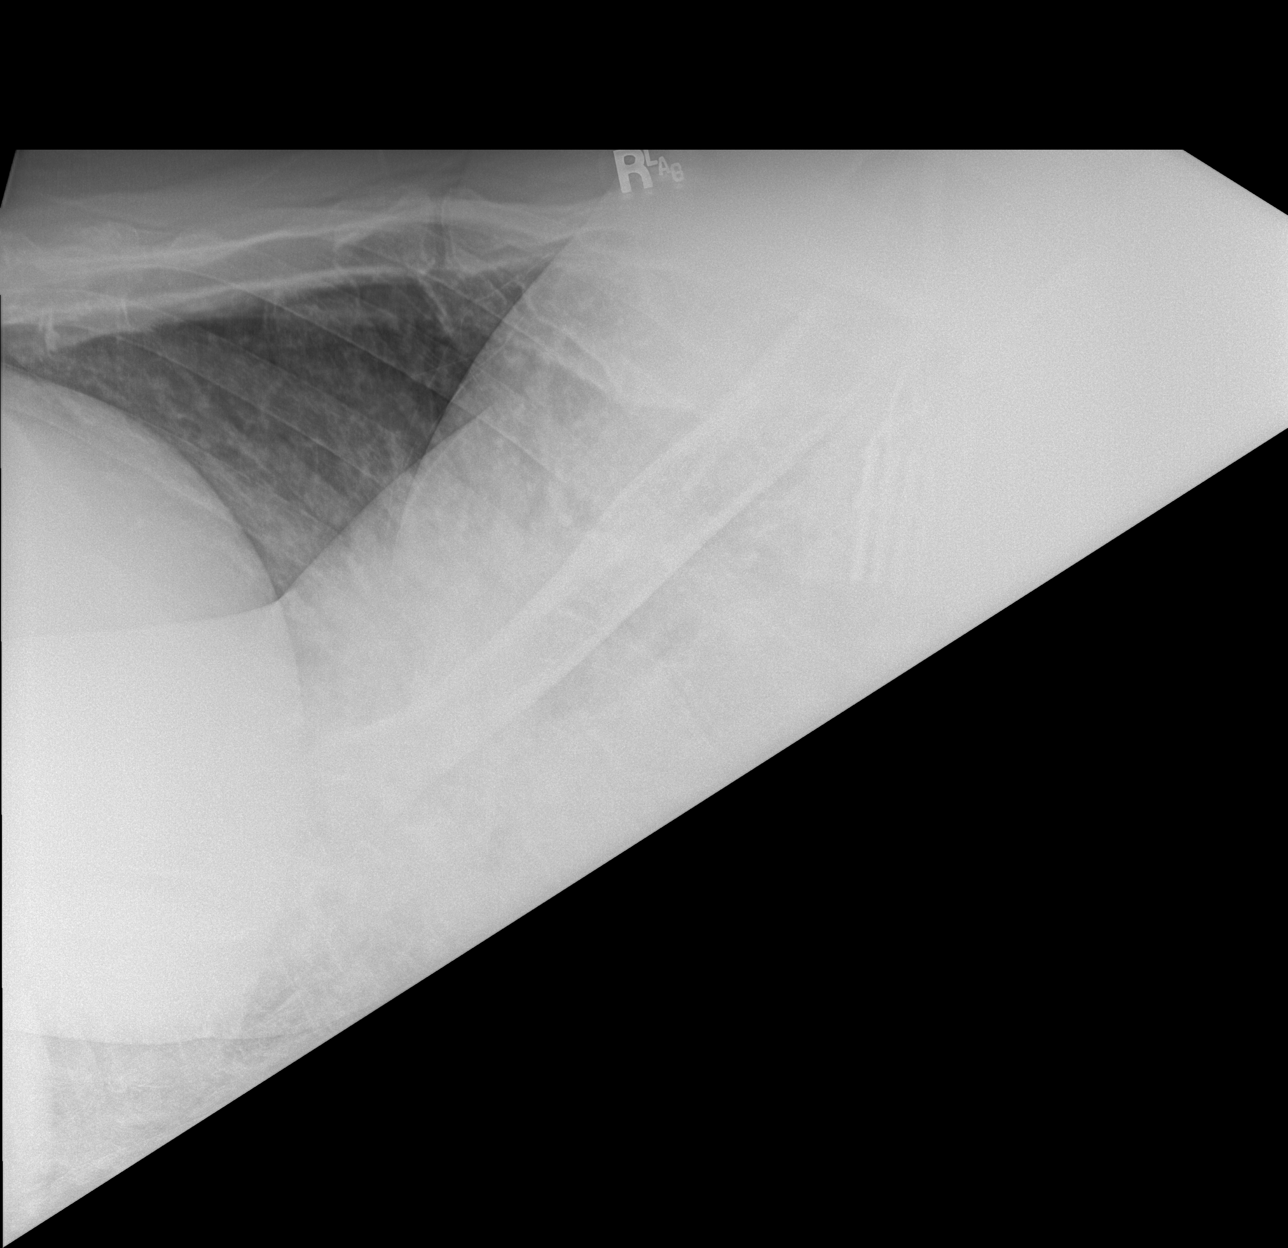

[4 of 4 positions shown; findings below may reference images not displayed]

FINDINGS: No fracture is seen. Mild amount of calcific tendinitis. Slight
inferior positioning of right humeral head probably largely related
to positioning. AC joint appears intact
IMPRESSION: No acute osseous abnormality

## 2021-03-12 NOTE — Discharge Instructions (Addendum)
Take Ibuprofen for pain as needed

## 2021-03-12 NOTE — ED Provider Notes (Signed)
[facility]-EMERGENCY DEPT Provider Note   CSN: [id number] Arrival date & time: 03/12/21  1759     History Chief Complaint  Patient presents with   Fall   Arm Injury    Stephanie Carrillo is a 65 y.o. female.  [name] is a 65 y/o female who presents to ED via EMS following a fall around 3pm today. Patient was carrying a microwave down steps and slipped on the last 2 steps and fell to the ground. She reports making impact with her jaw, right arm, and bilateral knees. She got assistance from a friend to help her into her apartment and sat on her couch. She reports increased pain in her right arm that concerned her enough to call EMS. Patient is alert and oriented, reports mild headache and pain to right arm and right knee. Able to give accurate history of events. Denies LOC, neck pain, back pain, numbness/tingling or other neurological symptoms. No evidence of cervical spine injury. Tetanus vaccination status up to date.  The history is provided by the patient.  Fall This is a new problem. The current episode started 3 to 5 hours ago. The problem has not changed since onset.Associated symptoms include headaches. She has tried nothing for the symptoms.  Arm Injury Location:  Arm Arm location:  R arm Injury: yes   Time since incident:  4 hours Mechanism of injury: fall   Fall:    Fall occurred:  Down stairs   Impact surface:  Primary school teacher of impact:  Face, outstretched arms and knees   Entrapped after fall: no   Pain details:    Quality:  Aching   Radiates to:  Does not radiate   Severity:  Moderate   Duration:  3 hours   Timing:  Constant   Progression:  Unchanged Dislocation: no   Foreign body present:  No foreign bodies Tetanus status:  Up to date Prior injury to area:  No Relieved by:  Immobilization Worsened by:  Movement Ineffective treatments:  None tried     Past Medical History:  Diagnosis Date   Anxiety    Depression     Hypertension    Insomnia    Meningitis    Recurrent UTI    Seizures (HCC)    Substance abuse [facility])     Patient Active Problem List   Diagnosis Date Noted   AKI (acute kidney injury) (HCC)    Fall    UTI (urinary tract infection) 10/04/2020   Nausea vomiting and diarrhea    Acute metabolic encephalopathy 02/23/2020   Benzodiazepine withdrawal (HCC) 02/01/2020   Hypokalemia    Tachycardia    Altered mental state 01/27/2020   Anxiety and depression 09/23/2018   Insomnia 09/23/2018   Chronic pain syndrome 09/23/2018    Past Surgical History:  Procedure Laterality Date   BACK SURGERY     CERVICAL DISC SURGERY     FRACTURE SURGERY N/A    Phreesia 05/07/2020   SPINE SURGERY N/A    Phreesia 05/07/2020     OB History   No obstetric history on file.     Family History  Problem Relation Age of Onset   Multiple sclerosis Mother    Cancer Mother        Possibly breast or colon mets to brain   Cancer Father    Lung cancer Father    Heart disease Father    Cancer Brother    Testicular cancer Brother  Prostate cancer Brother    Skin cancer Brother    Colon polyps Brother    Colon polyps Brother     Social History   Tobacco Use   Smoking status: Former   Smokeless tobacco: Never  Building services engineer Use: Never used  Substance Use Topics   Alcohol use: Not Currently   Drug use: Not Currently    Comment: opiates -clean 6 months    Home Medications Prior to Admission medications   Medication Sig Start Date End Date Taking? Authorizing Provider  acetaminophen (TYLENOL) 500 MG tablet Take 1,000 mg by mouth every 4 (four) hours.    [provider]  amLODipine (NORVASC) 5 MG tablet Take 1 tablet (5 mg total) by mouth daily. 09/25/18   [place], Alvira Philips, DO  buprenorphine (SUBUTEX) 2 MG SUBL SL tablet Place 2 mg under the tongue every 6 (six) hours. 01/05/20   [provider]  clonazePAM (KLONOPIN) 0.5 MG tablet Take 1 tablet (0.5 mg total) by mouth 3  (three) times daily as needed for anxiety. 01/07/21   Hilts, Casimiro Needle, MD  DULoxetine (CYMBALTA) 60 MG capsule Take 1 capsule (60 mg total) by mouth daily. 01/07/21   Hilts, Casimiro Needle, MD  gabapentin (NEURONTIN) 800 MG tablet Take 800 mg by mouth 3 (three) times daily.    [provider]  magnesium gluconate (MAGONATE) 500 MG tablet Take 500 mg by mouth 2 (two) times daily.    [provider]  nitrofurantoin (MACRODANTIN) 50 MG capsule Take 50 mg by mouth 4 (four) times daily.    [provider]  pantoprazole (PROTONIX) 40 MG tablet Take 1 tablet (40 mg total) by mouth daily at 6 (six) AM. 10/08/20   Rodolph Bong, MD  tiZANidine (ZANAFLEX) 4 MG tablet Take 1 tablet (4 mg total) by mouth every 8 (eight) hours as needed for muscle spasms. 01/31/21   Hilts, Casimiro Needle, MD    Allergies    Ciprofloxacin, Other, Penicillins, and Oxycodone  Review of Systems   Review of Systems  Neurological:  Positive for headaches.  All other systems reviewed and are negative.  Physical Exam Updated Vital Signs BP 118/72   Pulse 75   Temp 97.9 F (36.6 C) (Oral)   Resp 19   Ht 5\' 6"  (1.676 m)   SpO2 92%   BMI 32.89 kg/m   Physical Exam Constitutional:      Appearance: Normal appearance.  HENT:     Head: Normocephalic. Abrasion present.     Comments: Abrasion to chin Eyes:     Pupils: Pupils are equal, round, and reactive to light.  Cardiovascular:     Rate and Rhythm: Normal rate and regular rhythm.  Pulmonary:     Effort: Pulmonary effort is normal.     Breath sounds: Normal breath sounds.  Musculoskeletal:        General: Tenderness and signs of injury present. No deformity.     Right shoulder: Tenderness present. No deformity.     Right upper arm: Tenderness present. No deformity.     Right elbow: No deformity. Tenderness present.     Right forearm: Tenderness present. No deformity.     Right hand: No deformity. Decreased strength. Normal sensation. Normal pulse.      Cervical back: Normal. No bony tenderness.     Right knee: Laceration present. Tenderness present.     Right foot: Normal pulse.     Left foot: Normal pulse.     Comments: No  tenderness to palpation of cervical spine.  Neurological:     General: No focal deficit present.     Mental Status: She is alert.     Sensory: No sensory deficit.    ED Results / Procedures / Treatments   Labs (all labs ordered are listed, but only abnormal results are displayed) Labs Reviewed - No data to display  EKG None  Radiology No results found.  Procedures Procedures   Medications Ordered in ED Medications - No data to display  ED Course  I have reviewed the triage vital signs and the nursing notes.  Pertinent labs & imaging results that were available during my care of the patient were reviewed by me and considered in my medical decision making (see chart for details).    MDM Rules/Calculators/A&P                           65 y/o female who presents to ED via EMS after fall down steps. Slipped down approximately 2 stairs and made impact with face, right arm, and bilateral legs. Reported mild headache and worsening right arm pain. Denies LOC or other neurological symptoms. No need for cervical immobilization or imaging due to patient being alert and able to give accurate history of events, no tenderness to palpation of cervical spine, and no distracting injuries. C-collar removed. Patient is not taking blood thinners.   Patient has history of urinary retention and required in-and-out cath. XR of right humerus and forearm ordered. No signs of acute fracture or dislocation noted. Sling ordered for right arm. Patient is on buprenorphine so needs non-opioid for pain. Advised patient to follow up with PCP in one week and to take Ibuprofen for pain as needed.  Final Clinical Impression(s) / ED Diagnoses Final diagnoses:  Arm contusion, right, initial encounter  Arm abrasion, right, initial  encounter    Rx / DC Orders ED Discharge Orders     None        Etienne Millward, Raynelle Dick 03/12/21 2116    Terrilee Files, MD 03/13/21 1156

## 2021-03-12 NOTE — ED Notes (Signed)
Patient given discharge paperwork, all questions answered. Patient in possession of all belongings.

## 2021-03-12 NOTE — ED Notes (Signed)
Urine specimen and culture collected from [facility] residual and sent to lab. RN advised. [employer]

## 2021-03-13 ENCOUNTER — Ambulatory Visit (INDEPENDENT_AMBULATORY_CARE_PROVIDER_SITE_OTHER): Payer: Medicare Other | Admitting: Family Medicine

## 2021-03-13 ENCOUNTER — Encounter: Payer: Self-pay | Admitting: Family Medicine

## 2021-03-13 DIAGNOSIS — M25561 Pain in right knee: Secondary | ICD-10-CM

## 2021-03-13 DIAGNOSIS — M79601 Pain in right arm: Secondary | ICD-10-CM | POA: Diagnosis not present

## 2021-03-13 MED ORDER — BACLOFEN 10 MG PO TABS: 5.0000 mg | ORAL_TABLET | Freq: Three times a day (TID) | ORAL | 3 refills | Status: DC | PRN

## 2021-03-13 NOTE — Progress Notes (Signed)
Office Visit Note   Patient: Stephanie Carrillo           Date of Birth: Jul 15, 1956           MRN: [id number] Visit Date: 03/13/2021 Requested by: Lavada Mesi, MD [address] [place],  [place] [address] PCP: Lavada Mesi, MD  Subjective: No chief complaint on file.   HPI: She is here with multiple areas of pain but primarily her right shoulder.  Yesterday in the rain, she was carrying a new microwave, she slipped and fell down a couple stairs and hit her chin, arm, and both legs against the ground.  She did not lose consciousness but was in severe pain primarily in the shoulder.  She was taken by EMS to the hospital where x-rays were negative for obvious fracture.  She was given a shoulder sling but it seems to make her pain worse.  She is on buprenorphine per pain management for chronic back pain.               ROS:   All other systems were reviewed and are negative.  Objective: Vital Signs: There were no vitals taken for this visit.  Physical Exam:  General:  Alert and oriented, in no acute distress. Pulm:  Breathing unlabored. Psy:  Normal mood, congruent affect. Skin: She has multiple abrasions on her chin, right arm, knees, and toes.  No active bleeding. Right arm: She has limited abduction of her shoulder.  She has pain with empty can test and the weakness.  Good strength with internal and external rotation.   Imaging: DG Forearm Right  Result Date: 03/12/2021 CLINICAL DATA:  Fall with pain EXAM: RIGHT FOREARM - 2 VIEW COMPARISON:  None. FINDINGS: There is no evidence of fracture or other focal bone lesions. Soft tissues are unremarkable. IMPRESSION: Negative. Electronically Signed   By: Jasmine Pang M.D.   On: 03/12/2021 20:38   DG Humerus Right  Result Date: 03/12/2021 CLINICAL DATA:  Fall with pain at the proximal humerus EXAM: RIGHT HUMERUS - 2+ VIEW COMPARISON:  None. FINDINGS: No fracture is seen. Mild amount of calcific tendinitis. Slight inferior positioning of  right humeral head probably largely related to positioning. AC joint appears intact IMPRESSION: No acute osseous abnormality Electronically Signed   By: Jasmine Pang M.D.   On: 03/12/2021 20:37    Assessment & Plan: 1 day status post fall with right shoulder pain, concerning for supraspinatus tear. -Baclofen as needed.  Narcotics per pain clinic.  If after a week or 2, she is not any better, she will contact me and I will order MRI of her shoulder.     Procedures: No procedures performed        PMFS History: Patient Active Problem List   Diagnosis Date Noted   AKI (acute kidney injury) (HCC)    Fall    UTI (urinary tract infection) 10/04/2020   Nausea vomiting and diarrhea    Acute metabolic encephalopathy 02/23/2020   Benzodiazepine withdrawal (HCC) 02/01/2020   Hypokalemia    Tachycardia    Altered mental state 01/27/2020   Anxiety and depression 09/23/2018   Insomnia 09/23/2018   Chronic pain syndrome 09/23/2018   Past Medical History:  Diagnosis Date   Anxiety    Depression    Hypertension    Insomnia    Meningitis    Recurrent UTI    Seizures (HCC)    Substance abuse (HCC)     Family History  Problem Relation Age of  Onset   Multiple sclerosis Mother    Cancer Mother        Possibly breast or colon mets to brain   Cancer Father    Lung cancer Father    Heart disease Father    Cancer Brother    Testicular cancer Brother    Prostate cancer Brother    Skin cancer Brother    Colon polyps Brother    Colon polyps Brother     Past Surgical History:  Procedure Laterality Date   BACK SURGERY     CERVICAL DISC SURGERY     FRACTURE SURGERY N/A    [employer]reesia 05/07/2020   SPINE SURGERY N/A    Phreesia 05/07/2020   Social History   Occupational History   Not on file  Tobacco Use   Smoking status: Former   Smokeless tobacco: Never  Building services engineer Use: Never used  Substance and Sexual Activity   Alcohol use: Not Currently   Drug use: Not Currently     Comment: opiates -clean 6 months   Sexual activity: Not on file

## 2021-03-17 ENCOUNTER — Encounter (HOSPITAL_COMMUNITY): Payer: Self-pay | Admitting: Internal Medicine

## 2021-03-17 ENCOUNTER — Emergency Department (HOSPITAL_COMMUNITY): Payer: Medicare Other

## 2021-03-17 ENCOUNTER — Inpatient Hospital Stay (HOSPITAL_COMMUNITY)
Admission: EM | Admit: 2021-03-17 | Discharge: 2021-03-21 | DRG: 304 | Disposition: A | Discharge status: Home / Self Care | Payer: Medicare Other | Attending: Family Medicine | Admitting: Family Medicine

## 2021-03-17 DIAGNOSIS — R4182 Altered mental status, unspecified: Secondary | ICD-10-CM | POA: Diagnosis not present

## 2021-03-17 DIAGNOSIS — R350 Frequency of micturition: Secondary | ICD-10-CM | POA: Diagnosis present

## 2021-03-17 DIAGNOSIS — Z820 Family history of epilepsy and other diseases of the nervous system: Secondary | ICD-10-CM

## 2021-03-17 DIAGNOSIS — Z781 Physical restraint status: Secondary | ICD-10-CM

## 2021-03-17 DIAGNOSIS — Z66 Do not resuscitate: Secondary | ICD-10-CM | POA: Diagnosis present

## 2021-03-17 DIAGNOSIS — Z91138 Patient's unintentional underdosing of medication regimen for other reason: Secondary | ICD-10-CM

## 2021-03-17 DIAGNOSIS — W19XXXA Unspecified fall, initial encounter: Secondary | ICD-10-CM | POA: Diagnosis present

## 2021-03-17 DIAGNOSIS — Z888 Allergy status to other drugs, medicaments and biological substances status: Secondary | ICD-10-CM | POA: Diagnosis not present

## 2021-03-17 DIAGNOSIS — E669 Obesity, unspecified: Secondary | ICD-10-CM | POA: Diagnosis not present

## 2021-03-17 DIAGNOSIS — F419 Anxiety disorder, unspecified: Secondary | ICD-10-CM | POA: Diagnosis not present

## 2021-03-17 DIAGNOSIS — I739 Peripheral vascular disease, unspecified: Secondary | ICD-10-CM | POA: Diagnosis not present

## 2021-03-17 DIAGNOSIS — G4700 Insomnia, unspecified: Secondary | ICD-10-CM | POA: Diagnosis present

## 2021-03-17 DIAGNOSIS — G928 Other toxic encephalopathy: Secondary | ICD-10-CM | POA: Diagnosis not present

## 2021-03-17 DIAGNOSIS — Z8249 Family history of ischemic heart disease and other diseases of the circulatory system: Secondary | ICD-10-CM

## 2021-03-17 DIAGNOSIS — I160 Hypertensive urgency: Principal | ICD-10-CM | POA: Diagnosis present

## 2021-03-17 DIAGNOSIS — Z20822 Contact with and (suspected) exposure to covid-19: Secondary | ICD-10-CM | POA: Diagnosis present

## 2021-03-17 DIAGNOSIS — Z885 Allergy status to narcotic agent status: Secondary | ICD-10-CM

## 2021-03-17 DIAGNOSIS — G894 Chronic pain syndrome: Secondary | ICD-10-CM | POA: Diagnosis not present

## 2021-03-17 DIAGNOSIS — G9340 Encephalopathy, unspecified: Secondary | ICD-10-CM | POA: Diagnosis not present

## 2021-03-17 DIAGNOSIS — F13239 Sedative, hypnotic or anxiolytic dependence with withdrawal, unspecified: Secondary | ICD-10-CM | POA: Diagnosis not present

## 2021-03-17 DIAGNOSIS — I10 Essential (primary) hypertension: Secondary | ICD-10-CM | POA: Diagnosis not present

## 2021-03-17 DIAGNOSIS — F32A Depression, unspecified: Secondary | ICD-10-CM | POA: Diagnosis not present

## 2021-03-17 DIAGNOSIS — Z87891 Personal history of nicotine dependence: Secondary | ICD-10-CM

## 2021-03-17 DIAGNOSIS — T426X6A Underdosing of other antiepileptic and sedative-hypnotic drugs, initial encounter: Secondary | ICD-10-CM | POA: Diagnosis present

## 2021-03-17 DIAGNOSIS — Z91128 Patient's intentional underdosing of medication regimen for other reason: Secondary | ICD-10-CM

## 2021-03-17 DIAGNOSIS — I639 Cerebral infarction, unspecified: Secondary | ICD-10-CM | POA: Diagnosis not present

## 2021-03-17 DIAGNOSIS — Y92009 Unspecified place in unspecified non-institutional (private) residence as the place of occurrence of the external cause: Secondary | ICD-10-CM | POA: Diagnosis not present

## 2021-03-17 DIAGNOSIS — R404 Transient alteration of awareness: Secondary | ICD-10-CM | POA: Diagnosis not present

## 2021-03-17 DIAGNOSIS — T424X6A Underdosing of benzodiazepines, initial encounter: Secondary | ICD-10-CM | POA: Diagnosis not present

## 2021-03-17 DIAGNOSIS — G40909 Epilepsy, unspecified, not intractable, without status epilepticus: Secondary | ICD-10-CM | POA: Diagnosis not present

## 2021-03-17 DIAGNOSIS — F19239 Other psychoactive substance dependence with withdrawal, unspecified: Secondary | ICD-10-CM | POA: Diagnosis present

## 2021-03-17 DIAGNOSIS — R0902 Hypoxemia: Secondary | ICD-10-CM | POA: Diagnosis not present

## 2021-03-17 DIAGNOSIS — E876 Hypokalemia: Secondary | ICD-10-CM | POA: Diagnosis not present

## 2021-03-17 DIAGNOSIS — Y929 Unspecified place or not applicable: Secondary | ICD-10-CM | POA: Diagnosis not present

## 2021-03-17 DIAGNOSIS — S0083XA Contusion of other part of head, initial encounter: Secondary | ICD-10-CM | POA: Diagnosis present

## 2021-03-17 DIAGNOSIS — T40606A Underdosing of unspecified narcotics, initial encounter: Secondary | ICD-10-CM | POA: Diagnosis present

## 2021-03-17 DIAGNOSIS — Z4659 Encounter for fitting and adjustment of other gastrointestinal appliance and device: Secondary | ICD-10-CM

## 2021-03-17 DIAGNOSIS — Z6830 Body mass index (BMI) 30.0-30.9, adult: Secondary | ICD-10-CM

## 2021-03-17 DIAGNOSIS — Z880 Allergy status to penicillin: Secondary | ICD-10-CM | POA: Diagnosis not present

## 2021-03-17 DIAGNOSIS — N390 Urinary tract infection, site not specified: Secondary | ICD-10-CM | POA: Diagnosis not present

## 2021-03-17 DIAGNOSIS — Z87440 Personal history of urinary (tract) infections: Secondary | ICD-10-CM

## 2021-03-17 DIAGNOSIS — I619 Nontraumatic intracerebral hemorrhage, unspecified: Secondary | ICD-10-CM | POA: Diagnosis not present

## 2021-03-17 DIAGNOSIS — F19939 Other psychoactive substance use, unspecified with withdrawal, unspecified: Secondary | ICD-10-CM | POA: Diagnosis present

## 2021-03-17 DIAGNOSIS — Z4682 Encounter for fitting and adjustment of non-vascular catheter: Secondary | ICD-10-CM | POA: Diagnosis not present

## 2021-03-17 DIAGNOSIS — R410 Disorientation, unspecified: Secondary | ICD-10-CM | POA: Diagnosis not present

## 2021-03-17 DIAGNOSIS — Z79899 Other long term (current) drug therapy: Secondary | ICD-10-CM

## 2021-03-17 LAB — VITAMIN B12: Vitamin B-12: 697 pg/mL (ref 180–914)

## 2021-03-17 LAB — COMPREHENSIVE METABOLIC PANEL
ALT: 13 U/L (ref 0–44)
AST: 29 U/L (ref 15–41)
Albumin: 4.8 g/dL (ref 3.5–5.0)
Alkaline Phosphatase: 116 U/L (ref 38–126)
Anion gap: 11 (ref 5–15)
BUN: 13 mg/dL (ref 8–23)
CO2: 24 mmol/L (ref 22–32)
Calcium: 9.6 mg/dL (ref 8.9–10.3)
Chloride: 105 mmol/L (ref 98–111)
Creatinine, Ser: 0.67 mg/dL (ref 0.44–1.00)
GFR, Estimated: >60 mL/min (ref >60)
Glucose, Bld: 115 mg/dL — ABNORMAL HIGH (ref 70–99)
Potassium: 4.5 mmol/L (ref 3.5–5.1)
Sodium: 140 mmol/L (ref 135–145)
Total Bilirubin: 0.8 mg/dL (ref 0.3–1.2)
Total Protein: 9.4 g/dL — ABNORMAL HIGH (ref 6.5–8.1)

## 2021-03-17 LAB — RESP PANEL BY RT-PCR (FLU A&B, COVID) ARPGX2
Influenza A by PCR: NEGATIVE
Influenza B by PCR: NEGATIVE
SARS Coronavirus 2 by RT PCR: NEGATIVE

## 2021-03-17 LAB — I-STAT CHEM 8, ED
BUN: 15 mg/dL (ref 8–23)
Calcium, Ion: 1.17 mmol/L (ref 1.15–1.40)
Chloride: 108 mmol/L (ref 98–111)
Creatinine, Ser: 0.5 mg/dL (ref 0.44–1.00)
Glucose, Bld: 119 mg/dL — ABNORMAL HIGH (ref 70–99)
HCT: 45 % (ref 36.0–46.0)
Hemoglobin: 15.3 g/dL — ABNORMAL HIGH (ref 12.0–15.0)
Potassium: 6 mmol/L — ABNORMAL HIGH (ref 3.5–5.1)
Sodium: 140 mmol/L (ref 135–145)
TCO2: 30 mmol/L (ref 22–32)

## 2021-03-17 LAB — DIFFERENTIAL
Abs Immature Granulocytes: 0.04 10*3/uL (ref 0.00–0.07)
Basophils Absolute: 0 10*3/uL (ref 0.0–0.1)
Basophils Relative: 0 %
Eosinophils Absolute: 0 10*3/uL (ref 0.0–0.5)
Eosinophils Relative: 0 %
Immature Granulocytes: 0 %
Lymphocytes Relative: 11 %
Lymphs Abs: 1 10*3/uL (ref 0.7–4.0)
Monocytes Absolute: 0.6 10*3/uL (ref 0.1–1.0)
Monocytes Relative: 7 %
Neutro Abs: 7.5 10*3/uL (ref 1.7–7.7)
Neutrophils Relative %: 82 %

## 2021-03-17 LAB — CBG MONITORING, ED: Glucose-Capillary: 120 mg/dL — ABNORMAL HIGH (ref 70–99)

## 2021-03-17 LAB — TSH: TSH: 1.117 u[IU]/mL (ref 0.350–4.500)

## 2021-03-17 LAB — CBC
HCT: 43.8 % (ref 36.0–46.0)
Hemoglobin: 14.3 g/dL (ref 12.0–15.0)
MCH: 31.7 pg (ref 26.0–34.0)
MCHC: 32.6 g/dL (ref 30.0–36.0)
MCV: 97.1 fL (ref 80.0–100.0)
Platelets: 239 10*3/uL (ref 150–400)
RBC: 4.51 MIL/uL (ref 3.87–5.11)
RDW: 14.5 % (ref 11.5–15.5)
WBC: 9.2 10*3/uL (ref 4.0–10.5)
nRBC: 0 % (ref 0.0–0.2)

## 2021-03-17 LAB — PROTIME-INR
INR: 0.9 (ref 0.8–1.2)
Prothrombin Time: 12.4 seconds (ref 11.4–15.2)

## 2021-03-17 LAB — APTT: aPTT: 36 seconds (ref 24–36)

## 2021-03-17 LAB — ETHANOL: Alcohol, Ethyl (B): <10 mg/dL (ref <10)

## 2021-03-17 LAB — AMMONIA: Ammonia: 18 umol/L (ref 9–35)

## 2021-03-17 IMAGING — MR MR HEAD W/O CM
10 series · 42 of 48 positions shown · non-contrast
Comparison: [DATE]

CLINICAL DATA: Delirium

EXAM:
MRI HEAD WITHOUT CONTRAST
TECHNIQUE: Multiplanar, multiecho pulse sequences of the brain and surrounding
structures were obtained without intravenous contrast.

[Series 5: dwi_tracew · axial · 3.0mm · 1.08mm/px · z∈[-41,+103]mm · 8 of 102 slices shown]
[im 1/102]
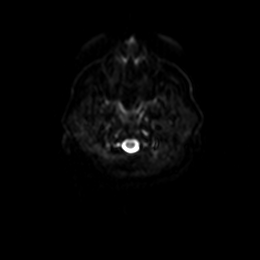
[im 19/102]
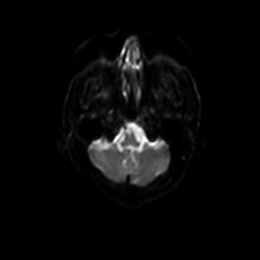
[im 28/102]
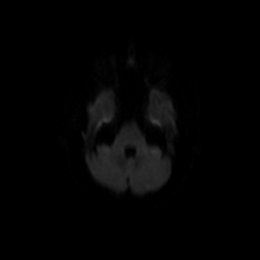
[im 46/102]
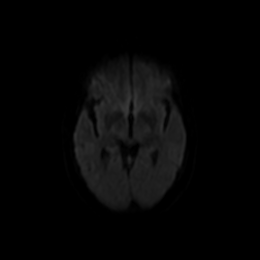
[im 56/102]
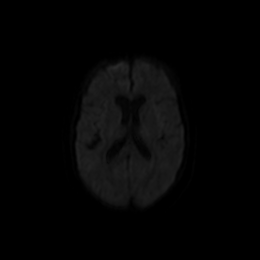
[im 74/102]
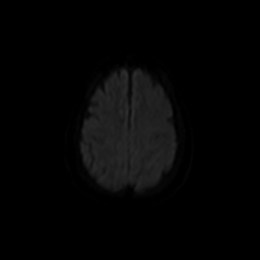
[im 83/102]
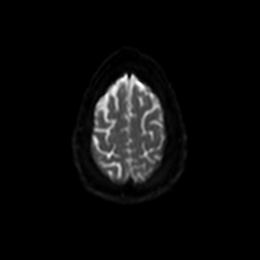
[im 102/102]
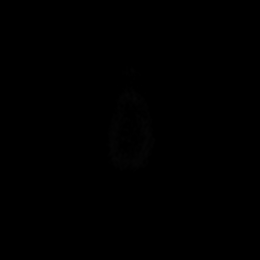

[Series 6: dwi_adc · axial · 3.0mm · 1.08mm/px · z∈[-41,+31]mm · 3 of 51 slices shown]
[im 1/51]
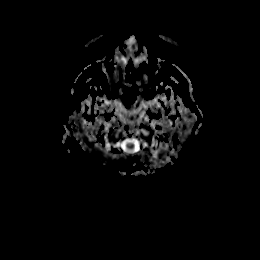
[im 13/51]
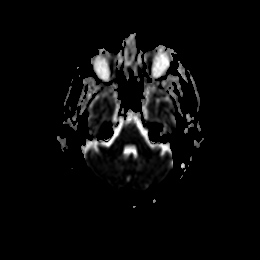
[im 26/51]
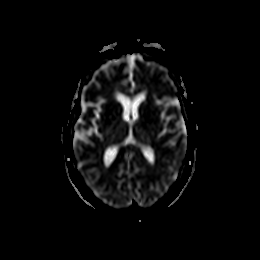

[Series 7: DWI · coronal · 5.0mm · 1.31mm/px · 5 of 52 slices shown (1 of 2)]
[im 1/52]
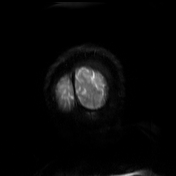
[im 13/52]
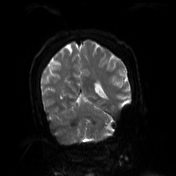
[im 26/52]
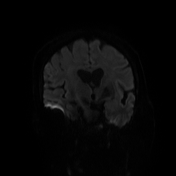
[im 39/52]
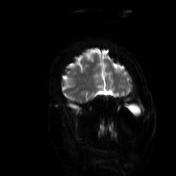
[im 52/52]
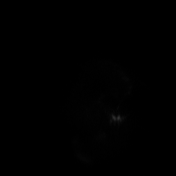

[Series 8: DWI · coronal · 5.0mm · 1.31mm/px · 3 of 26 slices shown (2 of 2)]
[im 1/26]
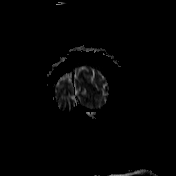
[im 13/26]
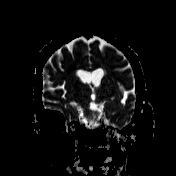
[im 26/26]
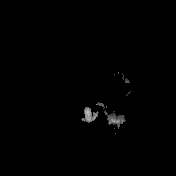

[Series 9: T2 · sagittal · 5.0mm · 0.47mm/px · 2 of 24 slices shown (1 of 3)]
[im 1/24]
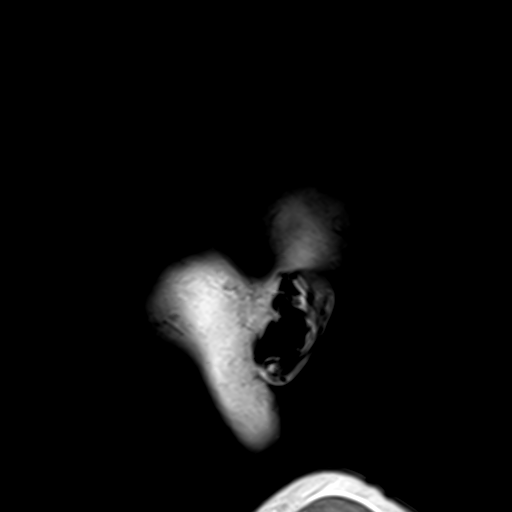
[im 24/24]
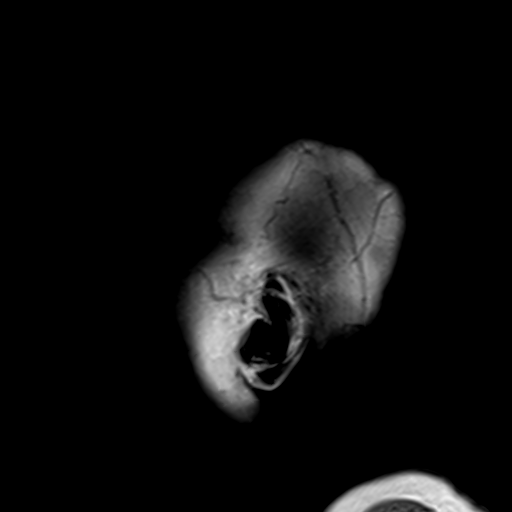

[Series 10: T2 · axial · 5.0mm · 0.45mm/px · z∈[-48,+101]mm · 3 of 25 slices shown (2 of 3)]
[im 1/25]
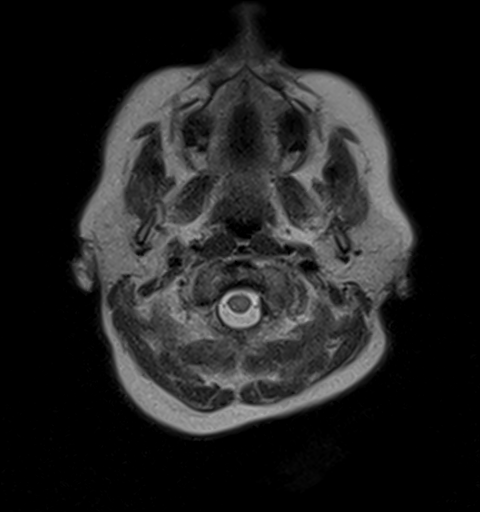
[im 13/25]
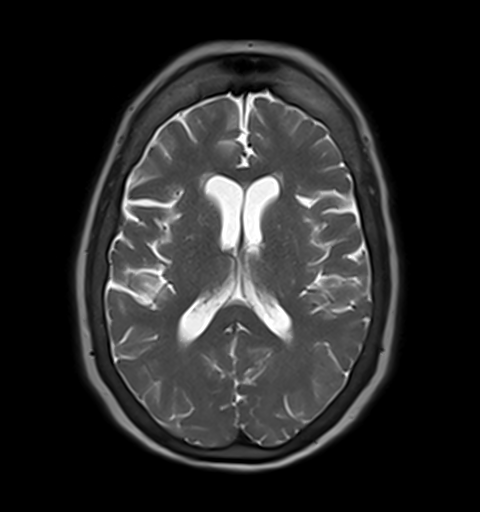
[im 25/25]
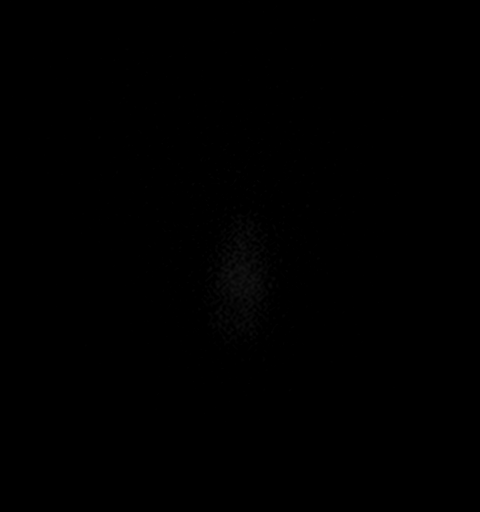

[Series 11: GRE · axial · 3.0mm · 0.45mm/px · z∈[-50,+93]mm · 5 of 51 slices shown]
[im 1/51]
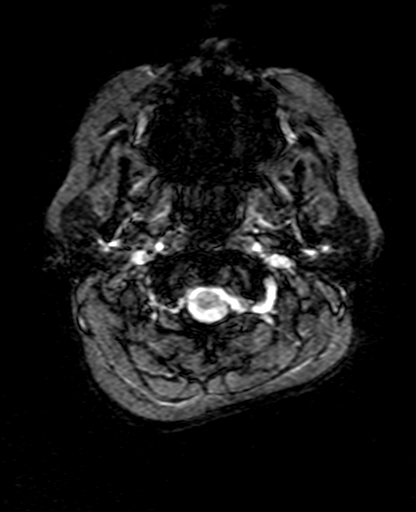
[im 13/51]
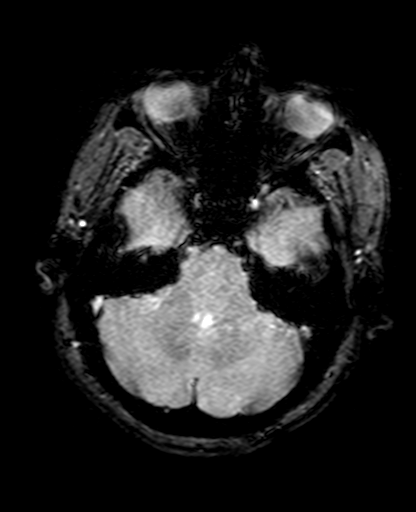
[im 26/51]
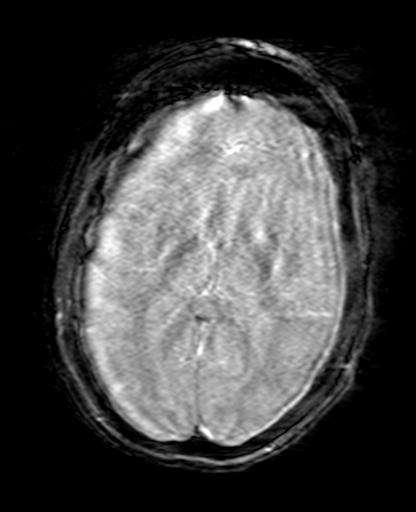
[im 38/51]
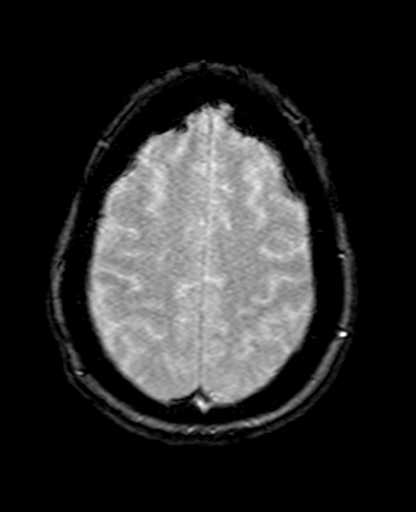
[im 51/51]
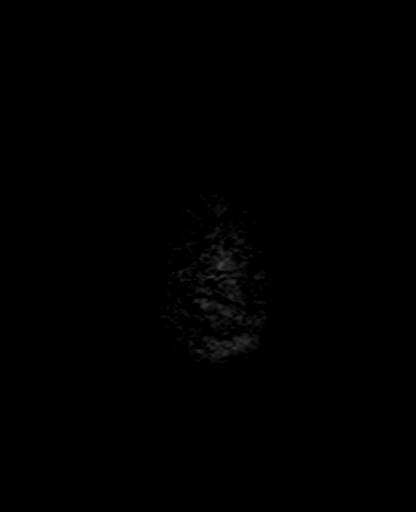

[Series 12: FLAIR · axial · 3.0mm · 0.86mm/px · z∈[-47,+96]mm · 5 of 51 slices shown]
[im 1/51]
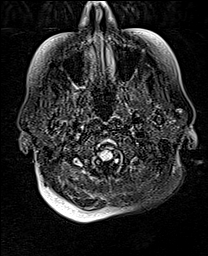
[im 13/51]
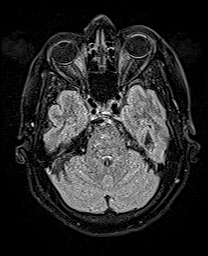
[im 26/51]
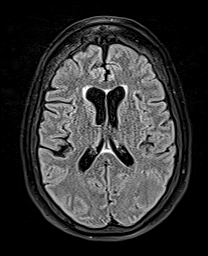
[im 38/51]
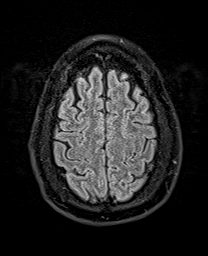
[im 51/51]
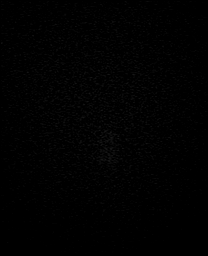

[Series 13: T1 · axial · 3.0mm · 0.45mm/px · z∈[-50,+93]mm · 5 of 51 slices shown]
[im 1/51]
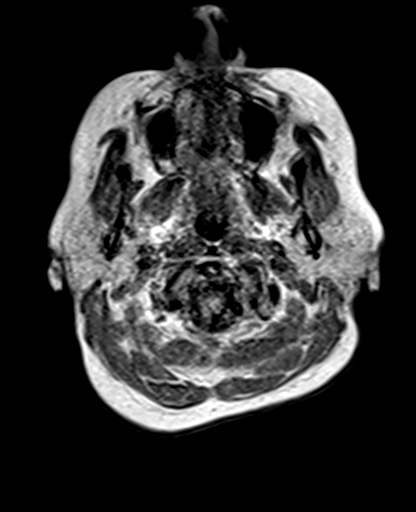
[im 13/51]
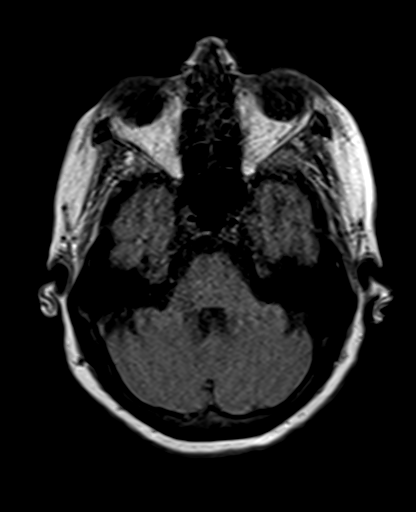
[im 26/51]
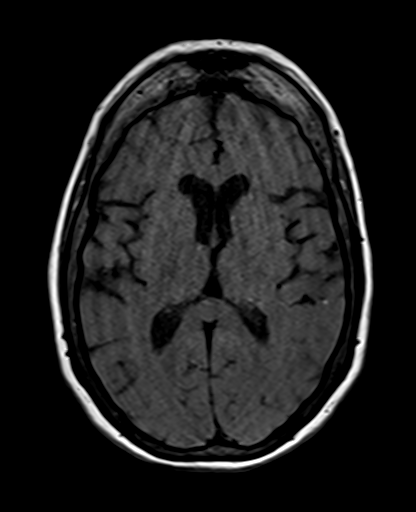
[im 38/51]
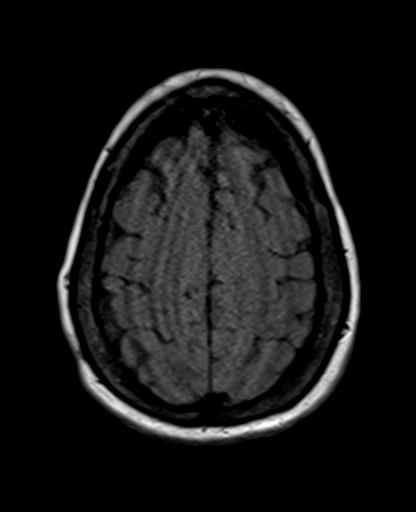
[im 51/51]
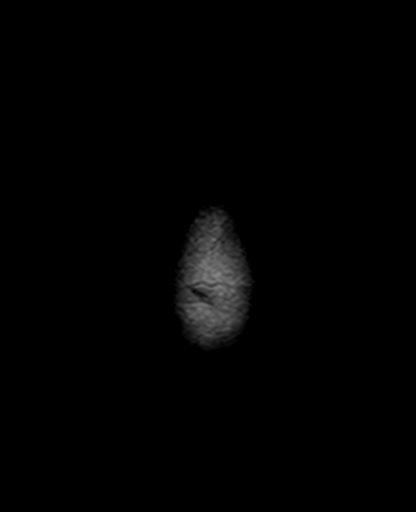

[Series 14: T2 · coronal · 5.0mm · 0.86mm/px · 3 of 30 slices shown (3 of 3)]
[im 1/30]
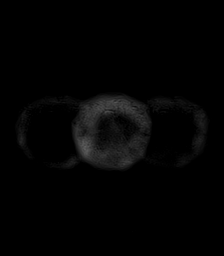
[im 15/30]
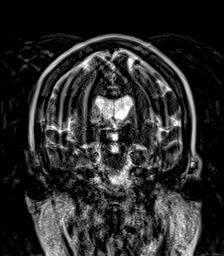
[im 30/30]
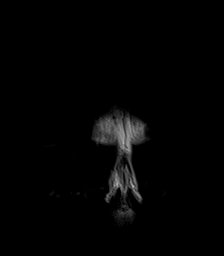

[42 of 48 positions shown; findings below may reference images not displayed]

FINDINGS: Brain: No acute infarct, mass effect or extra-axial collection. No
acute or chronic hemorrhage. There is multifocal hyperintense
T2-weighted signal within the white matter. Generalized volume loss
without a clear lobar predilection. The midline structures are
normal.

Vascular: Major flow voids are preserved.

Skull and upper cervical spine: Normal calvarium and skull base.
Visualized upper cervical spine and soft tissues are normal.

Sinuses/Orbits:No paranasal sinus fluid levels or advanced mucosal
thickening. No mastoid or middle ear effusion. Normal orbits.
IMPRESSION: 1. No acute intracranial abnormality.
2. Findings of mild chronic small vessel disease and volume loss.

## 2021-03-17 IMAGING — CT CT HEAD CODE STROKE
3 series · 15 of 47 positions shown, 18 images · non-contrast
Comparison: Head CT [DATE]

CLINICAL DATA: Code stroke. Neuro deficit, acute, stroke suspected.
Fell 5 days ago.

EXAM:
CT HEAD WITHOUT CONTRAST
TECHNIQUE: Contiguous axial images were obtained from the base of the skull
through the vertex without intravenous contrast.

[Series 3: head wo · axial · 0.47mm/px · z∈[-120,+10]mm · 9 of 32 slices shown, 12 images]
[im 3/32  brain]
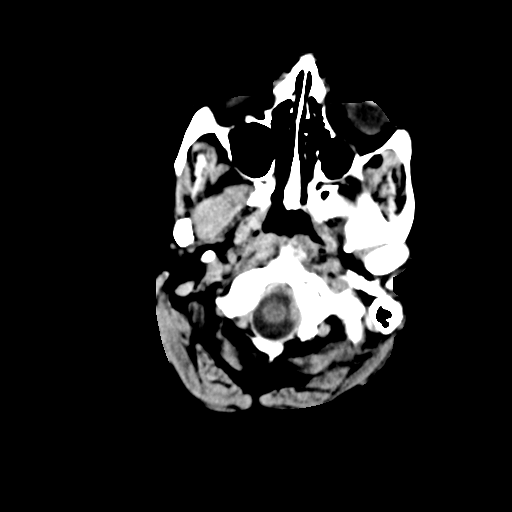
[im 3/32  bone]
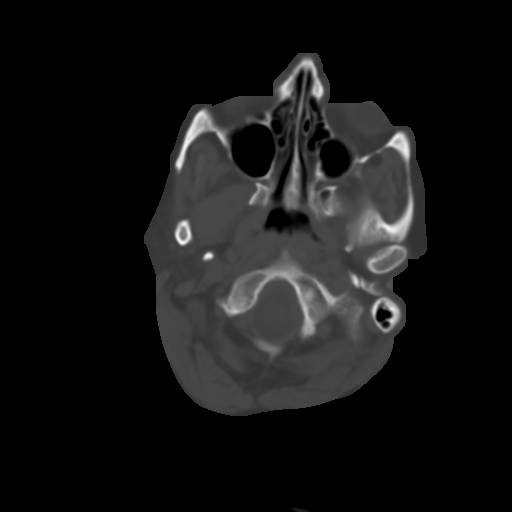
[im 6/32  brain]
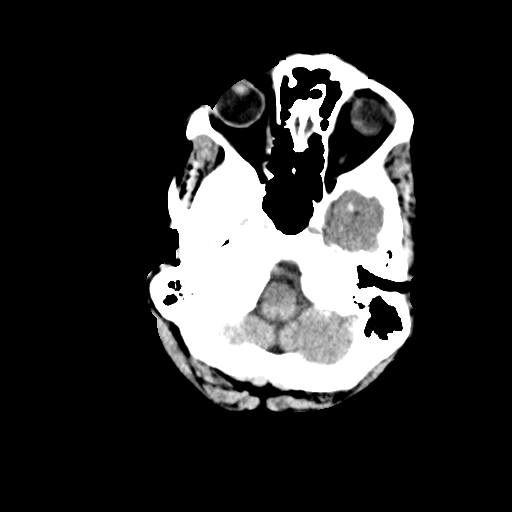
[im 9/32  brain]
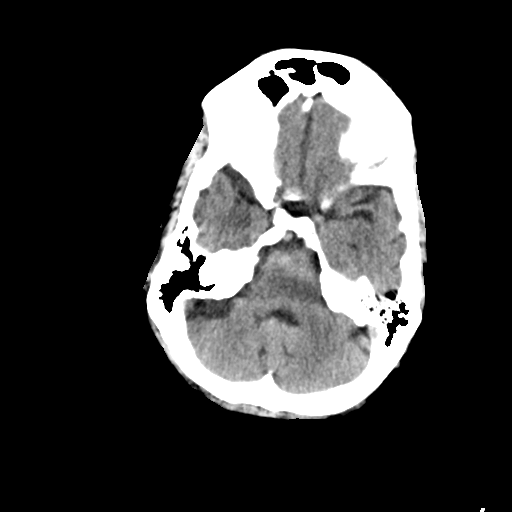
[im 12/32  brain]
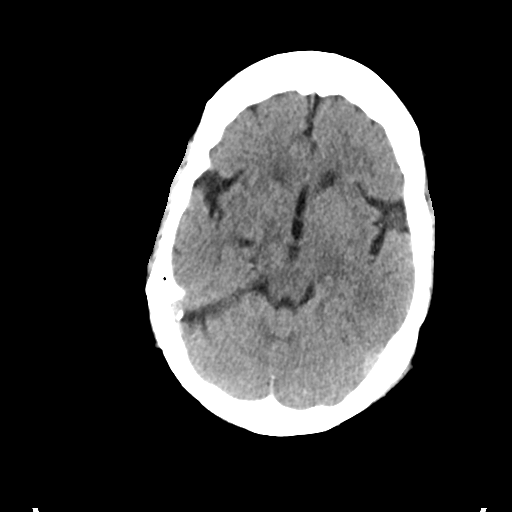
[im 17/32  brain]
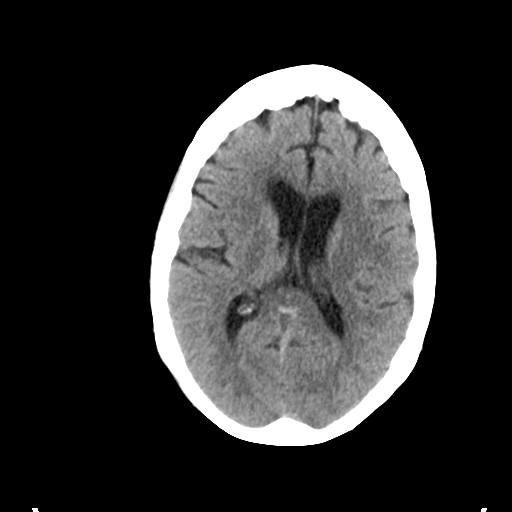
[im 17/32  bone]
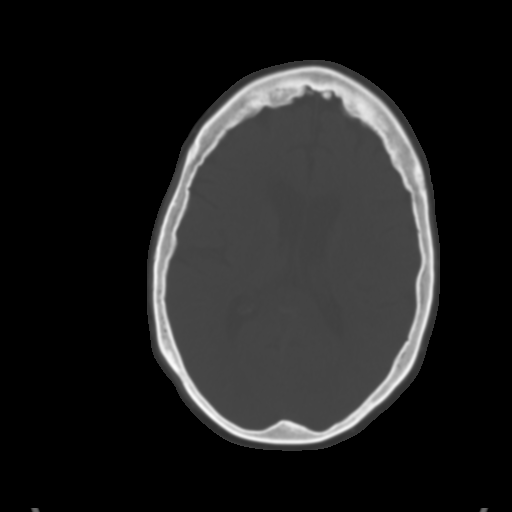
[im 20/32  brain]
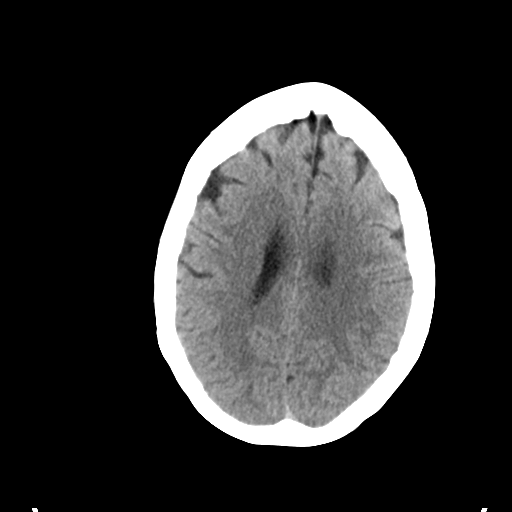
[im 23/32  brain]
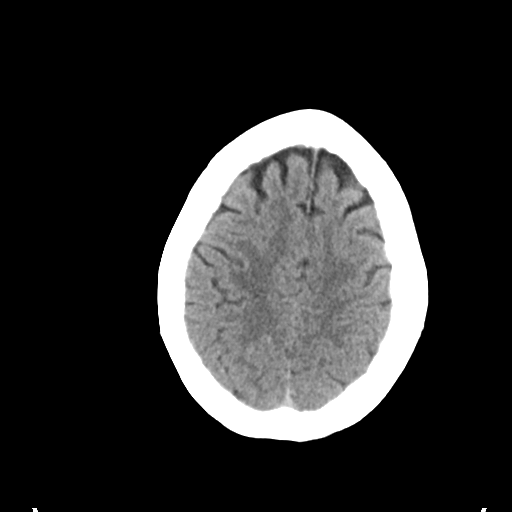
[im 26/32  brain]
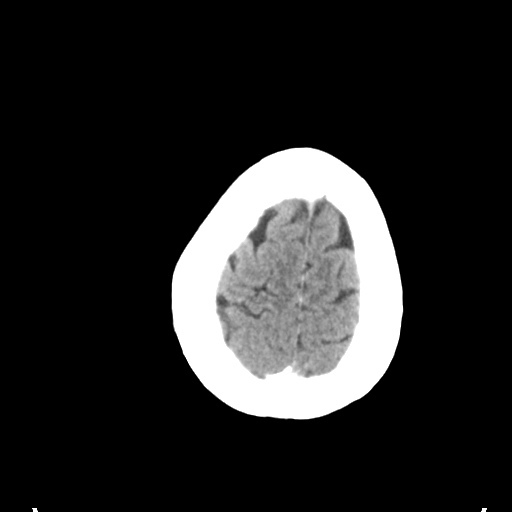
[im 29/32  brain]
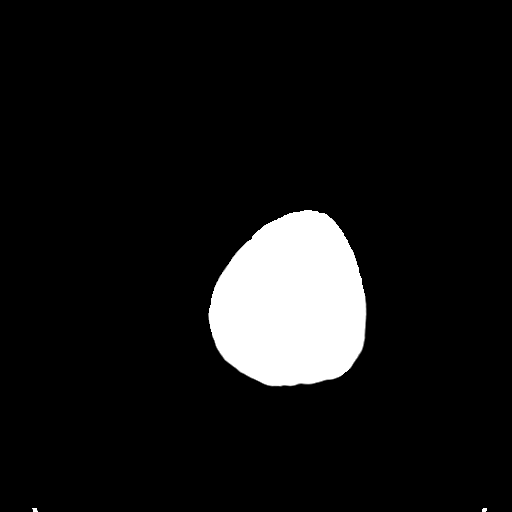
[im 29/32  bone]
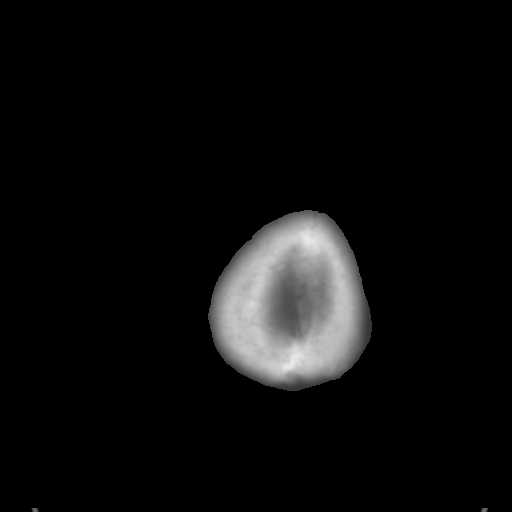

[Series 6: coronal soft tissue · coronal · 0.31mm/px · 3 of 71 slices shown]
[im 24/71  brain]
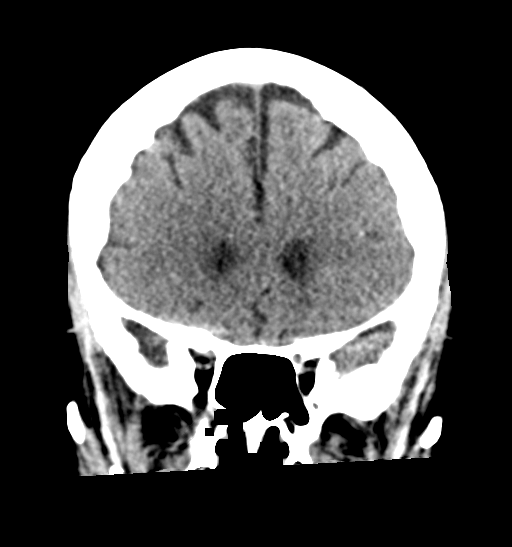
[im 32/71  brain]
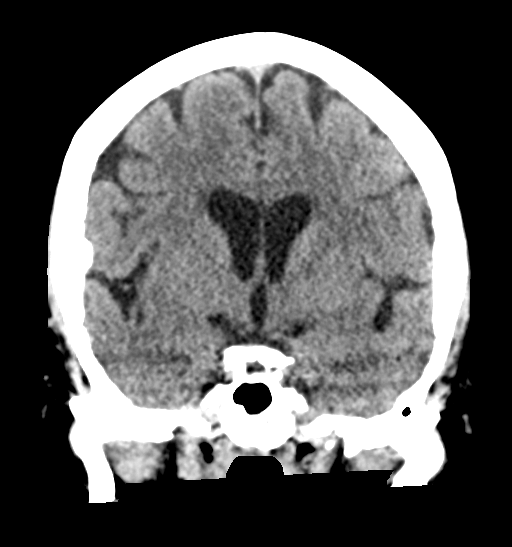
[im 39/71  brain]
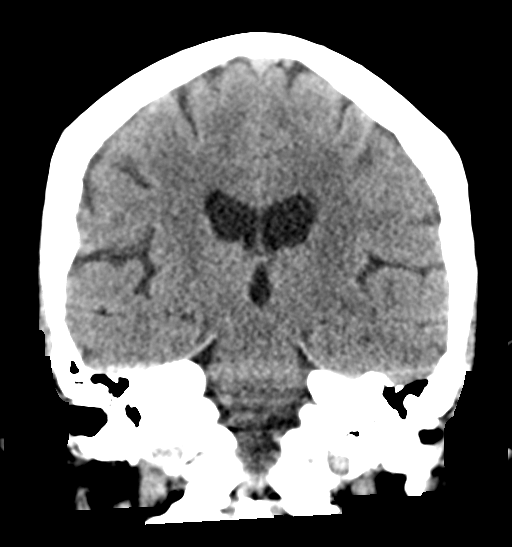

[Series 7: sagittal soft tissue · sagittal · 0.31mm/px · 3 of 53 slices shown]
[im 18/53  brain]
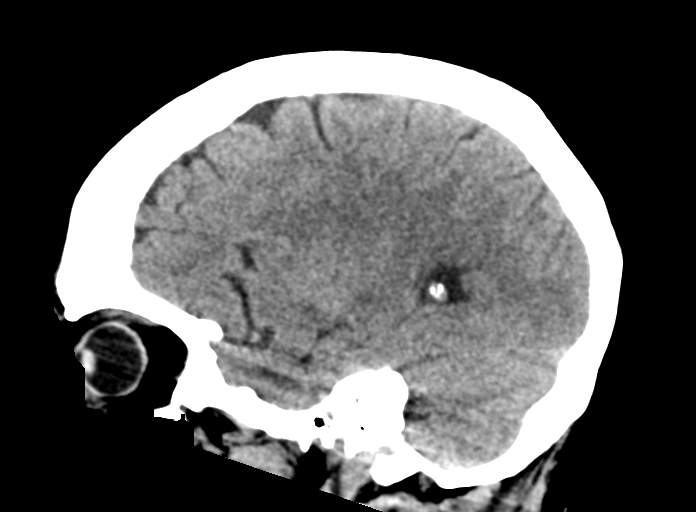
[im 27/53  brain]
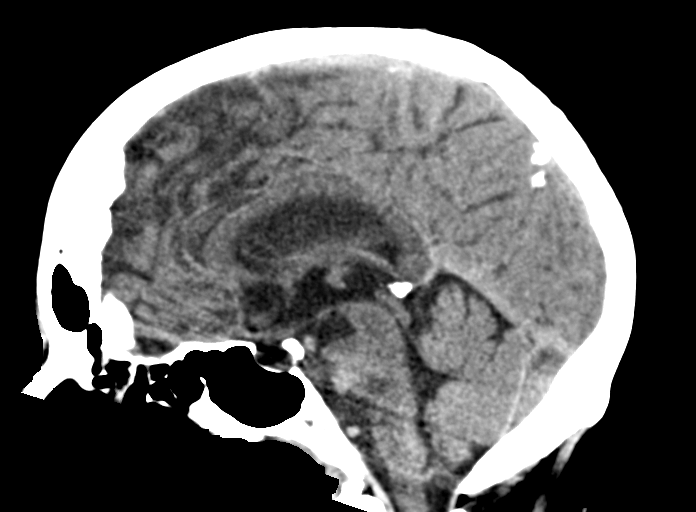
[im 35/53  brain]
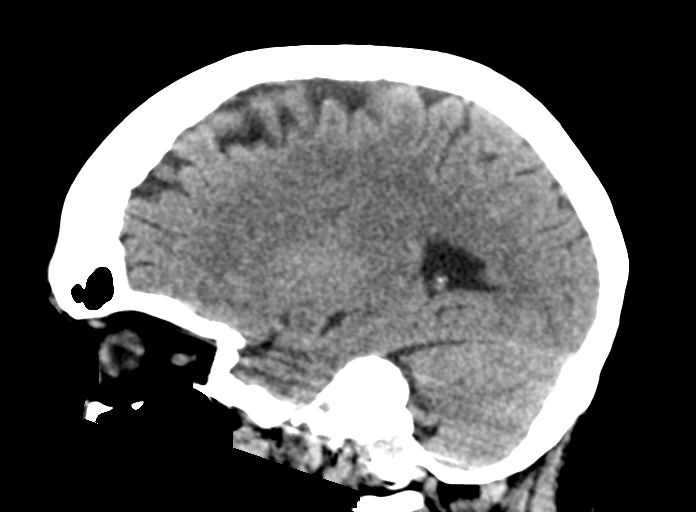

[15 of 47 positions shown; findings below may reference images not displayed]

FINDINGS: Brain: No evidence of old or acute infarction, mass lesion,
hemorrhage, hydrocephalus or extra-axial collection.

Vascular: No abnormal vascular finding.

Skull: Normal

Sinuses/Orbits: Clear/normal

Other: None

ASPECTS (Alberta Stroke Program Early CT Score)

- Ganglionic level infarction (caudate, lentiform nuclei, internal
capsule, insula, M1-M3 cortex): 7

- Supraganglionic infarction (M4-M6 cortex): 3

Total score (0-10 with 10 being normal): 10
IMPRESSION: 1. Normal head CT.
2. ASPECTS is 10.
3. These results were called by telephone at the time of
interpretation on [DATE] at [DATE] to provider DOMINIK , who
verbally acknowledged these results.

## 2021-03-17 MED ORDER — MIDAZOLAM HCL 2 MG/2ML IJ SOLN
4.0000 mg | Freq: Once | INTRAMUSCULAR | Status: AC
  Administered 2021-03-17: 4 mg via INTRAMUSCULAR
  Filled 2021-03-17: qty 4

## 2021-03-17 MED ORDER — ZIPRASIDONE MESYLATE 20 MG IM SOLR
20.0000 mg | Freq: Once | INTRAMUSCULAR | Status: AC
  Administered 2021-03-17: 20 mg via INTRAMUSCULAR
  Filled 2021-03-17: qty 20

## 2021-03-17 MED ORDER — ACETAMINOPHEN 325 MG PO TABS
650.0000 mg | ORAL_TABLET | Freq: Four times a day (QID) | ORAL | Status: DC | PRN
  Administered 2021-03-19 – 2021-03-21 (×6): 650 mg via ORAL
  Filled 2021-03-17 (×6): qty 2

## 2021-03-17 MED ORDER — ACETAMINOPHEN 650 MG RE SUPP: 650.0000 mg | Freq: Four times a day (QID) | RECTAL | Status: DC | PRN

## 2021-03-17 MED ORDER — MIDAZOLAM HCL 2 MG/2ML IJ SOLN
2.0000 mg | Freq: Once | INTRAMUSCULAR | Status: DC | PRN
Start: 2021-03-17 — End: 2021-03-17

## 2021-03-17 MED ORDER — LABETALOL HCL 5 MG/ML IV SOLN
10.0000 mg | INTRAVENOUS | Status: DC | PRN
  Administered 2021-03-18 (×2): 10 mg via INTRAVENOUS
  Filled 2021-03-17 (×2): qty 4

## 2021-03-17 MED ORDER — LORAZEPAM 2 MG/ML IJ SOLN
2.0000 mg | INTRAMUSCULAR | Status: DC | PRN
  Administered 2021-03-17 – 2021-03-18 (×4): 2 mg via INTRAVENOUS
  Filled 2021-03-17 (×5): qty 1

## 2021-03-17 MED ORDER — LABETALOL HCL 5 MG/ML IV SOLN
20.0000 mg | Freq: Once | INTRAVENOUS | Status: AC
  Administered 2021-03-17: 20 mg via INTRAVENOUS
  Filled 2021-03-17: qty 4

## 2021-03-17 NOTE — ED Triage Notes (Signed)
Ems brings pt in from home for altered mental status. Family states pt is not acting right today. Pt has not been taking her medications as prescribed.

## 2021-03-17 NOTE — ED Notes (Signed)
Family at bedside. 

## 2021-03-17 NOTE — ED Provider Notes (Addendum)
[facility]-EMERGENCY DEPT Provider Note   CSN: [id number] Arrival date & time: 03/17/21  1345     History Chief Complaint  Patient presents with   Altered Mental Status    Stephanie Carrillo is a 65 y.o. female.  PatientPatient presents to the ER chief complaint ultimately status.  She was last seen well last night around 6 PM.  Her best friend is her power of attorney and checks on her often.  She checked on her at 3 PM and she was doing well and again at 6 PM she spoke with her on the phone and the patient was sounding her normal self.  However she was not answering all day today.  When the patient went to check on her she found her walking around her home appearing confused and only half dressed.  Words appeared nonsensical and incomprehensible and she was brought to the ER by her friend.  Additional review of systems unable to be obtained given patient appears confused/aphasic      Past Medical History:  Diagnosis Date   Anxiety    Depression    Hypertension    Insomnia    Meningitis    Recurrent UTI    Seizures (HCC)    Substance abuse (HCC)     Patient Active Problem List   Diagnosis Date Noted   Hypertensive urgency 03/17/2021   Acute encephalopathy 03/17/2021   AKI (acute kidney injury) (HCC)    Fall    UTI (urinary tract infection) 10/04/2020   Nausea vomiting and diarrhea    Acute metabolic encephalopathy 02/23/2020   Benzodiazepine withdrawal (HCC) 02/01/2020   Hypokalemia    Tachycardia    Altered mental state 01/27/2020   Anxiety and depression 09/23/2018   Insomnia 09/23/2018   Chronic pain syndrome 09/23/2018    Past Surgical History:  Procedure Laterality Date   BACK SURGERY     CERVICAL DISC SURGERY     FRACTURE SURGERY N/A    [employer] 05/07/2020   SPINE SURGERY N/A    [employer] 05/07/2020     OB History   No obstetric history on file.     Family History  Problem Relation Age of Onset   Multiple sclerosis Mother     Cancer Mother        Possibly breast or colon mets to brain   Cancer Father    Lung cancer Father    Heart disease Father    Cancer Brother    Testicular cancer Brother    Prostate cancer Brother    Skin cancer Brother    Colon polyps Brother    Colon polyps Brother     Social History   Tobacco Use   Smoking status: Former   Smokeless tobacco: Never  Building services engineer Use: Never used  Substance Use Topics   Alcohol use: Not Currently   Drug use: Not Currently    Comment: opiates -clean 6 months    Home Medications Prior to Admission medications   Medication Sig Start Date End Date Taking? Authorizing Provider  acetaminophen (TYLENOL) 500 MG tablet Take 1,000 mg by mouth every 4 (four) hours.    [provider]  amLODipine (NORVASC) 5 MG tablet Take 1 tablet (5 mg total) by mouth daily. 09/25/18   [place], Alvira Philips, DO  baclofen (LIORESAL) 10 MG tablet Take 0.5-1 tablets (5-10 mg total) by mouth 3 (three) times daily as needed for muscle spasms. 03/13/21   Hilts, Casimiro Needle, MD  buprenorphine (  SUBUTEX) 2 MG SUBL SL tablet Place 2 mg under the tongue every 6 (six) hours. 01/05/20   [provider]  clonazePAM (KLONOPIN) 0.5 MG tablet Take 1 tablet (0.5 mg total) by mouth 3 (three) times daily as needed for anxiety. 01/07/21   Hilts, Casimiro Needle, MD  DULoxetine (CYMBALTA) 60 MG capsule Take 1 capsule (60 mg total) by mouth daily. 01/07/21   Hilts, Casimiro Needle, MD  gabapentin (NEURONTIN) 800 MG tablet Take 800 mg by mouth 3 (three) times daily.    [provider]  magnesium gluconate (MAGONATE) 500 MG tablet Take 500 mg by mouth 2 (two) times daily.    [provider]  nitrofurantoin (MACRODANTIN) 50 MG capsule Take 50 mg by mouth 4 (four) times daily.    [provider]  pantoprazole (PROTONIX) 40 MG tablet Take 1 tablet (40 mg total) by mouth daily at 6 (six) AM. 10/08/20   Rodolph Bong, MD  tiZANidine (ZANAFLEX) 4 MG tablet Take 1 tablet (4 mg  total) by mouth every 8 (eight) hours as needed for muscle spasms. 01/31/21   Hilts, Casimiro Needle, MD    Allergies    Ciprofloxacin, Other, Penicillins, and Oxycodone  Review of Systems   Review of Systems  Unable to perform ROS: Mental status change   Physical Exam Updated Vital Signs BP (!) 225/114 (BP Location: Left Arm)   Pulse 99   Temp 98.1 F (36.7 C) (Oral)   Resp 20   SpO2 92%   Physical Exam Constitutional:      General: She is not in acute distress.    Appearance: Normal appearance.  HENT:     Head: Normocephalic.     Nose: Nose normal.  Eyes:     Extraocular Movements: Extraocular movements intact.  Cardiovascular:     Rate and Rhythm: Normal rate.  Pulmonary:     Effort: Pulmonary effort is normal.  Musculoskeletal:        General: Normal range of motion.     Cervical back: Normal range of motion.  Neurological:     Mental Status: She is alert.     Comments: Patient appears aphasic.  I cannot make sense of any of the words she is saying.  Otherwise appears to be moving all extremities.  Does appear to follow commands and appears to have understanding of what I am asking her.  No facial droop noted.  No cranial nerve deficit noted.    ED Results / Procedures / Treatments   Labs (all labs ordered are listed, but only abnormal results are displayed) Labs Reviewed  COMPREHENSIVE METABOLIC PANEL - Abnormal; Notable for the following components:      Result Value   Glucose, Bld 115 (*)    Total Protein 9.4 (*)    All other components within normal limits  CBG MONITORING, ED - Abnormal; Notable for the following components:   Glucose-Capillary 120 (*)    All other components within normal limits  I-STAT CHEM 8, ED - Abnormal; Notable for the following components:   Potassium 6.0 (*)    Glucose, Bld 119 (*)    Hemoglobin 15.3 (*)    All other components within normal limits  RESP PANEL BY RT-PCR (FLU A&B, COVID) ARPGX2  ETHANOL  PROTIME-INR  APTT  CBC   DIFFERENTIAL  RAPID URINE DRUG SCREEN, HOSP PERFORMED  URINALYSIS, ROUTINE W REFLEX MICROSCOPIC  RAPID URINE DRUG SCREEN, HOSP PERFORMED  HIV ANTIBODY (ROUTINE TESTING W REFLEX)  AMMONIA  VITAMIN B1  TSH  VITAMIN B12    EKG EKG Interpretation  Date/Time:  Sunday March 17 2021 15:33:08 EDT Ventricular Rate:  108 PR Interval:  182 QRS Duration: 80 QT Interval:  338 QTC Calculation: 453 R Axis:   74 Text Interpretation: Sinus tachycardia Consider left atrial enlargement RSR' in V1 or V2, probably normal variant Confirmed by Norman Clay ([id number]) on 03/17/2021 3:54:56 PM  Radiology MR BRAIN WO CONTRAST  Result Date: 03/17/2021 CLINICAL DATA:  Delirium EXAM: MRI HEAD WITHOUT CONTRAST TECHNIQUE: Multiplanar, multiecho pulse sequences of the brain and surrounding structures were obtained without intravenous contrast. COMPARISON:  01/29/2020 FINDINGS: Brain: No acute infarct, mass effect or extra-axial collection. No acute or chronic hemorrhage. There is multifocal hyperintense T2-weighted signal within the white matter. Generalized volume loss without a clear lobar predilection. The midline structures are normal. Vascular: Major flow voids are preserved. Skull and upper cervical spine: Normal calvarium and skull base. Visualized upper cervical spine and soft tissues are normal. Sinuses/Orbits:No paranasal sinus fluid levels or advanced mucosal thickening. No mastoid or middle ear effusion. Normal orbits. IMPRESSION: 1. No acute intracranial abnormality. 2. Findings of mild chronic small vessel disease and volume loss. Electronically Signed   By: Deatra Robinson M.D.   On: 03/17/2021 20:37   CT HEAD CODE STROKE WO CONTRAST  Result Date: 03/17/2021 CLINICAL DATA:  Code stroke. Neuro deficit, acute, stroke suspected. Fell 5 days ago. EXAM: CT HEAD WITHOUT CONTRAST TECHNIQUE: Contiguous axial images were obtained from the base of the skull through the vertex without intravenous contrast. COMPARISON:   Head CT 10/04/2020 FINDINGS: Brain: No evidence of old or acute infarction, mass lesion, hemorrhage, hydrocephalus or extra-axial collection. Vascular: No abnormal vascular finding. Skull: Normal Sinuses/Orbits: Clear/normal Other: None ASPECTS (Alberta Stroke Program Early CT Score) - Ganglionic level infarction (caudate, lentiform nuclei, internal capsule, insula, M1-M3 cortex): 7 - Supraganglionic infarction (M4-M6 cortex): 3 Total score (0-10 with 10 being normal): 10 IMPRESSION: 1. Normal head CT. 2. ASPECTS is 10. 3. These results were called by telephone at the time of interpretation on 03/17/2021 at 3:58 pm to provider [facility] , who verbally acknowledged these results. Electronically Signed   By: Paulina Fusi M.D.   On: 03/17/2021 15:59    Procedures .Critical Care E&M  Date/Time: 03/17/2021 9:26 PM Performed by: Cheryll Cockayne, MD  Critical care provider statement:    Critical care time (minutes):  40   Critical care time was exclusive of:  Separately billable procedures and treating other patients   Critical care was necessary to treat or prevent imminent or life-threatening deterioration of the following conditions:  CNS failure or compromise After initial E/M assessment, critical care services were subsequently performed that were exclusive of separately billable procedures or treatment.   Comments:     Acute encephalopathy   Medications Ordered in ED Medications  labetalol (NORMODYNE) injection 20 mg (has no administration in time range)  acetaminophen (TYLENOL) tablet 650 mg (has no administration in time range)    Or  acetaminophen (TYLENOL) suppository 650 mg (has no administration in time range)  labetalol (NORMODYNE) injection 10 mg (has no administration in time range)  LORazepam (ATIVAN) injection 2 mg (has no administration in time range)  ziprasidone (GEODON) injection 20 mg (20 mg Intramuscular Given 03/17/21 1711)  midazolam (VERSED) injection 4 mg (4 mg  Intramuscular Given 03/17/21 1927)    ED Course  I have reviewed the triage vital signs and the nursing notes.  Pertinent labs & imaging results that were  available during my care of the patient were reviewed by me and considered in my medical decision making (see chart for details).    MDM Rules/Calculators/A&P                           Patient not a candidate for tPA given last known well was greater than 10 hours ago.  However she may be candidate for neuro intervention.  Stroke alert was activated.  Per stroke neurologist no tPA, stroke alert was canceled by neurology team.  MRI pursued, patient required sedation for MRI.  No acute findings on MRI.  Etiology of her confusion encephalopathy unclear.  UDS ordered and pending.  Patient noted to be more more hypertensive during her ER stay.  Initially permissive hypertension pursued given working diagnosis of acute stroke.  However MRI was, and blood pressure medication was given IV labetalol 20 mg.  Will be admitted to the hospitalist team   Final Clinical Impression(s) / ED Diagnoses Final diagnoses:  Acute encephalopathy  Hypertension, unspecified type    Rx / DC Orders ED Discharge Orders     None        Cheryll CockayneHong, Archana Eckman S, MD 03/17/21 2126    Cheryll CockayneHong, Josilyn Shippee S, MD 03/17/21 2128

## 2021-03-17 NOTE — Progress Notes (Signed)
A consult was placed to the IV Therapist to place a 2nd iv site;  pt has large bruise by her right shoulder, and another bruise on the inside of her right arm;  Left arm assessed w ultrasound; no suitable veins noted to attempt; RN and MD aware;  pt has a 22ga near her finger on her right hand.

## 2021-03-17 NOTE — H&P (Signed)
History and Physical    Stephanie Carrillo:[id number] DOB: June 15, 1956 DOA: 03/17/2021  PCP: Stephanie Mesi, MD  Patient coming from: Home.  History obtained from patient's healthcare power of attorney [name].  Patient is confused.  Chief Complaint: Confusion.  HPI: Stephanie Carrillo is a 65 y.o. female with history of chronic pain, hypertension was brought to the ER after patient was found to be confused.  Per patient's healthcare power of attorney [name] who provided the history patient was last seen yesterday at around 3 PM when patient was normal.  She did talk to her last evening around 6 PM.  This morning when she tried to call her she did not answer and at around noontime went to check on her at her home when she was found to be confused and naked.  Patient was brought to the ER.  Last week about 5 days ago patient had a fall at home and bruised her face and extremities.  Patient was brought to the ER x-rays did not show any fracture had followed up with her primary care physician and per the healthcare power of attorney primary care physician felt that patient may have had a rotator cuff tear on the right shoulder was started on baclofen yesterday which patient took a dose yesterday.  Otherwise patient has not taken her blood pressure medicine for last few days as she ran out of it.  Otherwise she has been taking her Suboxone, gabapentin, tizanidine and Klonopin.  Patient had a similar picture in March of this year when it was attributed to UTI.  ED Course: In the ER patient was confused and her speech was not making any sense.  Blood pressure was showing systolic of 225 diastolic 115.  CT head followed by MRI of the brain did not show anything acute.  At this time patient is being given labetalol 20 mg IV for the blood pressure.  UA urine drug screens are pending.  Complete metabolic panel and CBC unremarkable.  Alcohol levels are negative.  COVID test is negative.   Patient was given 1 dose of Geodon for MRI since patient was agitated.  Patient admitted for further management of acute encephalopathy and hypertensive urgency.  Review of Systems: As per HPI, rest all negative.   Past Medical History:  Diagnosis Date   Anxiety    Depression    Hypertension    Insomnia    Meningitis    Recurrent UTI    Seizures (HCC)    Substance abuse [facility])     Past Surgical History:  Procedure Laterality Date   BACK SURGERY     CERVICAL DISC SURGERY     FRACTURE SURGERY N/A    [employer]reesia 05/07/2020   SPINE SURGERY N/A    [employer]reesia 05/07/2020     reports that she has quit smoking. She has never used smokeless tobacco. She reports that she does not currently use alcohol. She reports that she does not currently use drugs.  Allergies  Allergen Reactions   Ciprofloxacin Shortness Of Breath and Other (See Comments)    Respiratory arrest   Other Anaphylaxis   Penicillins Shortness Of Breath and Rash    Did it involve swelling of the face/tongue/throat, SOB, or low BP? y Did it involve sudden or severe rash/hives, skin peeling, or any reaction on the inside of your mouth or nose? y Did you need to seek medical attention at a hospital or doctor's office? n When did it last happen?  1985 If all above answers are "NO", may proceed with cephalosporin use.   Oxycodone Other (See Comments)    Patient in recovery process.    Family History  Problem Relation Age of Onset   Multiple sclerosis Mother    Cancer Mother        Possibly breast or colon mets to brain   Cancer Father    Lung cancer Father    Heart disease Father    Cancer Brother    Testicular cancer Brother    Prostate cancer Brother    Skin cancer Brother    Colon polyps Brother    Colon polyps Brother     Prior to Admission medications   Medication Sig Start Date End Date Taking? Authorizing Provider  acetaminophen (TYLENOL) 500 MG tablet Take 1,000 mg by mouth every 4 (four) hours.     [provider]  amLODipine (NORVASC) 5 MG tablet Take 1 tablet (5 mg total) by mouth daily. 09/25/18   [place], Alvira Philips, DO  baclofen (LIORESAL) 10 MG tablet Take 0.5-1 tablets (5-10 mg total) by mouth 3 (three) times daily as needed for muscle spasms. 03/13/21   Hilts, Casimiro Needle, MD  buprenorphine (SUBUTEX) 2 MG SUBL SL tablet Place 2 mg under the tongue every 6 (six) hours. 01/05/20   [provider]  clonazePAM (KLONOPIN) 0.5 MG tablet Take 1 tablet (0.5 mg total) by mouth 3 (three) times daily as needed for anxiety. 01/07/21   Hilts, Casimiro Needle, MD  DULoxetine (CYMBALTA) 60 MG capsule Take 1 capsule (60 mg total) by mouth daily. 01/07/21   Hilts, Casimiro Needle, MD  gabapentin (NEURONTIN) 800 MG tablet Take 800 mg by mouth 3 (three) times daily.    [provider]  magnesium gluconate (MAGONATE) 500 MG tablet Take 500 mg by mouth 2 (two) times daily.    [provider]  nitrofurantoin (MACRODANTIN) 50 MG capsule Take 50 mg by mouth 4 (four) times daily.    [provider]  pantoprazole (PROTONIX) 40 MG tablet Take 1 tablet (40 mg total) by mouth daily at 6 (six) AM. 10/08/20   Rodolph Bong, MD  tiZANidine (ZANAFLEX) 4 MG tablet Take 1 tablet (4 mg total) by mouth every 8 (eight) hours as needed for muscle spasms. 01/31/21   Hilts, Casimiro Needle, MD    Physical Exam: Constitutional: Moderately built and nourished. Vitals:   03/17/21 1800 03/17/21 1930 03/17/21 2049 03/17/21 2110  BP: (!) 190/180 (!) 212/99 (!) 225/114 (!) 225/114  Pulse: (!) 105 96 94 99  Resp: 20 (!) 21  20  Temp:      TempSrc:      SpO2: 97% 95% 100% 92%   Eyes: Anicteric no pallor. ENMT: Bruise on the chin. Neck: No neck rigidity. Respiratory: No rhonchi or crepitations. Cardiovascular: S1-S2 heard. Abdomen: Soft nontender bowel sound present. Musculoskeletal: No edema. Skin: Bruise on the chin and facial areas. Neurologic: Patient is alert awake but confused.  Does not follow commands.   Moving all extremities.  Pupils are equal and reacting to light. Psychiatric: Appears confused.   Labs on Admission: I have personally reviewed following labs and imaging studies  CBC: Recent Labs  Lab 03/17/21 1605 03/17/21 1610  WBC 9.2  --   NEUTROABS 7.5  --   HGB 14.3 15.3*  HCT 43.8 45.0  MCV 97.1  --   PLT 239  --    Basic Metabolic Panel: Recent Labs  Lab 03/17/21 1605 03/17/21 1610  NA 140 140  K 4.5 6.0*  CL 105 108  CO2 24  --   GLUCOSE 115* 119*  BUN 13 15  CREATININE 0.67 0.50  CALCIUM 9.6  --    GFR: CrCl cannot be calculated (Unknown ideal weight.). Liver Function Tests: Recent Labs  Lab 03/17/21 1605  AST 29  ALT 13  ALKPHOS 116  BILITOT 0.8  PROT 9.4*  ALBUMIN 4.8   No results for input(s): LIPASE, AMYLASE in the last 168 hours. No results for input(s): AMMONIA in the last 168 hours. Coagulation Profile: Recent Labs  Lab 03/17/21 1605  INR 0.9   Cardiac Enzymes: No results for input(s): CKTOTAL, CKMB, CKMBINDEX, TROPONINI in the last 168 hours. BNP (last 3 results) No results for input(s): PROBNP in the last 8760 hours. HbA1C: No results for input(s): HGBA1C in the last 72 hours. CBG: Recent Labs  Lab 03/17/21 1402  GLUCAP 120*   Lipid Profile: No results for input(s): CHOL, HDL, LDLCALC, TRIG, CHOLHDL, LDLDIRECT in the last 72 hours. Thyroid Function Tests: No results for input(s): TSH, T4TOTAL, FREET4, T3FREE, THYROIDAB in the last 72 hours. Anemia Panel: No results for input(s): VITAMINB12, FOLATE, FERRITIN, TIBC, IRON, RETICCTPCT in the last 72 hours. Urine analysis:    Component Value Date/Time   COLORURINE YELLOW 10/04/2020 1358   APPEARANCEUR HAZY (A) 10/04/2020 1358   LABSPEC 1.017 10/04/2020 1358   PHURINE 5.0 10/04/2020 1358   GLUCOSEU NEGATIVE 10/04/2020 1358   HGBUR MODERATE (A) 10/04/2020 1358   BILIRUBINUR NEGATIVE 10/04/2020 1358   KETONESUR 20 (A) 10/04/2020 1358   PROTEINUR 100 (A) 10/04/2020 1358    NITRITE NEGATIVE 10/04/2020 1358   LEUKOCYTESUR LARGE (A) 10/04/2020 1358   Sepsis Labs: @LABRCNTIP (procalcitonin:4,lacticidven:4) ) Recent Results (from the past 240 hour(s))  Resp Panel by RT-PCR (Flu A&B, Covid) Nasopharyngeal Swab     Status: None   Collection Time: 03/17/21  4:15 PM   Specimen: Nasopharyngeal Swab; Nasopharyngeal(NP) swabs in vial transport medium  Result Value Ref Range Status   SARS Coronavirus 2 by RT PCR NEGATIVE NEGATIVE Final    Comment: (NOTE) SARS-CoV-2 target nucleic acids are NOT DETECTED.  The SARS-CoV-2 RNA is generally detectable in upper respiratory specimens during the acute phase of infection. The lowest concentration of SARS-CoV-2 viral copies this assay can detect is 138 copies/mL. A negative result does not preclude SARS-Cov-2 infection and should not be used as the sole basis for treatment or other patient management decisions. A negative result may occur with  improper specimen collection/handling, submission of specimen other than nasopharyngeal swab, presence of viral mutation(s) within the areas targeted by this assay, and inadequate number of viral copies(<138 copies/mL). A negative result must be combined with clinical observations, patient history, and epidemiological information. The expected result is Negative.  Fact Sheet for Patients:  BloggerCourse.comhttps://www.fda.gov/media/152166/download  Fact Sheet for Healthcare Providers:  SeriousBroker.ithttps://www.fda.gov/media/152162/download  This test is no t yet approved or cleared by the [place] FDA and  has been authorized for detection and/or diagnosis of SARS-CoV-2 by FDA under an Emergency Use Authorization (EUA). This EUA will remain  in effect (meaning this test can be used) for the duration of the COVID-19 declaration under Section 564(b)(1) of the Act, 21 U.S.C.section 360bbb-3(b)(1), unless the authorization is terminated  or revoked sooner.       Influenza A by PCR NEGATIVE NEGATIVE  Final   Influenza B by PCR NEGATIVE NEGATIVE Final    Comment: (NOTE) The Xpert Xpress SARS-CoV-2/FLU/RSV plus assay is intended as an aid in  the diagnosis of influenza from Nasopharyngeal swab specimens and should not be used as a sole basis for treatment. Nasal washings and aspirates are unacceptable for Xpert Xpress SARS-CoV-2/FLU/RSV testing.  Fact Sheet for Patients: BloggerCourse.com  Fact Sheet for Healthcare Providers: SeriousBroker.it  This test is not yet approved or cleared by the [place] FDA and has been authorized for detection and/or diagnosis of SARS-CoV-2 by FDA under an Emergency Use Authorization (EUA). This EUA will remain in effect (meaning this test can be used) for the duration of the COVID-19 declaration under Section 564(b)(1) of the Act, 21 U.S.C. section 360bbb-3(b)(1), unless the authorization is terminated or revoked.  Performed at [facility], [address] [place], [place] [address]      Radiological Exams on Admission: MR BRAIN WO CONTRAST  Result Date: 03/17/2021 CLINICAL DATA:  Delirium EXAM: MRI HEAD WITHOUT CONTRAST TECHNIQUE: Multiplanar, multiecho pulse sequences of the brain and surrounding structures were obtained without intravenous contrast. COMPARISON:  01/29/2020 FINDINGS: Brain: No acute infarct, mass effect or extra-axial collection. No acute or chronic hemorrhage. There is multifocal hyperintense T2-weighted signal within the white matter. Generalized volume loss without a clear lobar predilection. The midline structures are normal. Vascular: Major flow voids are preserved. Skull and upper cervical spine: Normal calvarium and skull base. Visualized upper cervical spine and soft tissues are normal. Sinuses/Orbits:No paranasal sinus fluid levels or advanced mucosal thickening. No mastoid or middle ear effusion. Normal orbits. IMPRESSION: 1. No acute intracranial  abnormality. 2. Findings of mild chronic small vessel disease and volume loss. Electronically Signed   By: Deatra Robinson M.D.   On: 03/17/2021 20:37   CT HEAD CODE STROKE WO CONTRAST  Result Date: 03/17/2021 CLINICAL DATA:  Code stroke. Neuro deficit, acute, stroke suspected. Fell 5 days ago. EXAM: CT HEAD WITHOUT CONTRAST TECHNIQUE: Contiguous axial images were obtained from the base of the skull through the vertex without intravenous contrast. COMPARISON:  Head CT 10/04/2020 FINDINGS: Brain: No evidence of old or acute infarction, mass lesion, hemorrhage, hydrocephalus or extra-axial collection. Vascular: No abnormal vascular finding. Skull: Normal Sinuses/Orbits: Clear/normal Other: None ASPECTS (Alberta Stroke Program Early CT Score) - Ganglionic level infarction (caudate, lentiform nuclei, internal capsule, insula, M1-M3 cortex): 7 - Supraganglionic infarction (M4-M6 cortex): 3 Total score (0-10 with 10 being normal): 10 IMPRESSION: 1. Normal head CT. 2. ASPECTS is 10. 3. These results were called by telephone at the time of interpretation on 03/17/2021 at 3:58 pm to provider [facility] , who verbally acknowledged these results. Electronically Signed   By: Paulina Fusi M.D.   On: 03/17/2021 15:59    EKG: Independently reviewed.  Sinus tachycardia.  Assessment/Plan Principal Problem:   Acute encephalopathy Active Problems:   Anxiety and depression   Chronic pain syndrome   Hypertensive urgency    Acute encephalopathy -cause not clear.  Patient appears confused does not follow commands.  Patient is afebrile.  Differentials include either medication withdrawal or induced by medicine or could be due to hypertension.  I have ordered ammonia levels, thiamine, TSH, RPR, B12 levels.  Patient does not look to be in any active seizures.  We will closely monitor in stepdown.  Keep patient on as needed IV Ativan. Hypertensive urgency -Per patient's healthcare power of attorney patient had recently ran out  of her medications.  Patient is receiving IV labetalol at this time.  Blood pressure continues to remain very high will start patient on continuous infusion. History of chronic pain recently started on  baclofen had taken 1 dose yesterday.  Patient also takes gabapentin and tizanidine and Suboxone.  Presently we are keeping patient only on Ativan.  Drug screen is pending. History of anxiety takes Klonopin as needed presently as needed Ativan. Recent fall with bruise on the face.   DVT prophylaxis: SCDs.  Avoiding anticoagulation anticipation of possible procedure if needed. Code Status: DNR confirmed with patient's healthcare power of attorney. Family Communication: Patient's healthcare power of attorney. Disposition Plan: To be determined. Consults called: None. Admission status: Observation.   Eduard Clos MD [facility] Pager [phone].  If 7PM-7AM, please contact night-coverage [email or web address] Password [facility]  03/17/2021, 9:19 PM

## 2021-03-17 NOTE — ED Notes (Addendum)
Patient transported to MRI 

## 2021-03-18 ENCOUNTER — Observation Stay (HOSPITAL_COMMUNITY): Payer: Medicare Other

## 2021-03-18 DIAGNOSIS — E669 Obesity, unspecified: Secondary | ICD-10-CM | POA: Diagnosis present

## 2021-03-18 DIAGNOSIS — F419 Anxiety disorder, unspecified: Secondary | ICD-10-CM

## 2021-03-18 DIAGNOSIS — I160 Hypertensive urgency: Secondary | ICD-10-CM | POA: Diagnosis present

## 2021-03-18 DIAGNOSIS — G894 Chronic pain syndrome: Secondary | ICD-10-CM

## 2021-03-18 DIAGNOSIS — F32A Depression, unspecified: Secondary | ICD-10-CM

## 2021-03-18 DIAGNOSIS — Z91128 Patient's intentional underdosing of medication regimen for other reason: Secondary | ICD-10-CM | POA: Diagnosis not present

## 2021-03-18 DIAGNOSIS — G9340 Encephalopathy, unspecified: Secondary | ICD-10-CM | POA: Diagnosis not present

## 2021-03-18 DIAGNOSIS — I10 Essential (primary) hypertension: Secondary | ICD-10-CM

## 2021-03-18 DIAGNOSIS — Z888 Allergy status to other drugs, medicaments and biological substances status: Secondary | ICD-10-CM | POA: Diagnosis not present

## 2021-03-18 DIAGNOSIS — T424X6A Underdosing of benzodiazepines, initial encounter: Secondary | ICD-10-CM | POA: Diagnosis present

## 2021-03-18 DIAGNOSIS — F13239 Sedative, hypnotic or anxiolytic dependence with withdrawal, unspecified: Secondary | ICD-10-CM | POA: Diagnosis not present

## 2021-03-18 DIAGNOSIS — S0083XA Contusion of other part of head, initial encounter: Secondary | ICD-10-CM | POA: Diagnosis present

## 2021-03-18 DIAGNOSIS — Z4682 Encounter for fitting and adjustment of non-vascular catheter: Secondary | ICD-10-CM | POA: Diagnosis not present

## 2021-03-18 DIAGNOSIS — Z87891 Personal history of nicotine dependence: Secondary | ICD-10-CM | POA: Diagnosis not present

## 2021-03-18 DIAGNOSIS — F19239 Other psychoactive substance dependence with withdrawal, unspecified: Secondary | ICD-10-CM | POA: Diagnosis present

## 2021-03-18 DIAGNOSIS — Z6830 Body mass index (BMI) 30.0-30.9, adult: Secondary | ICD-10-CM | POA: Diagnosis not present

## 2021-03-18 DIAGNOSIS — T40606A Underdosing of unspecified narcotics, initial encounter: Secondary | ICD-10-CM | POA: Diagnosis present

## 2021-03-18 DIAGNOSIS — Y929 Unspecified place or not applicable: Secondary | ICD-10-CM | POA: Diagnosis not present

## 2021-03-18 DIAGNOSIS — Z820 Family history of epilepsy and other diseases of the nervous system: Secondary | ICD-10-CM | POA: Diagnosis not present

## 2021-03-18 DIAGNOSIS — G40909 Epilepsy, unspecified, not intractable, without status epilepticus: Secondary | ICD-10-CM | POA: Diagnosis present

## 2021-03-18 DIAGNOSIS — W19XXXA Unspecified fall, initial encounter: Secondary | ICD-10-CM | POA: Diagnosis present

## 2021-03-18 DIAGNOSIS — E876 Hypokalemia: Secondary | ICD-10-CM | POA: Diagnosis present

## 2021-03-18 DIAGNOSIS — F19939 Other psychoactive substance use, unspecified with withdrawal, unspecified: Secondary | ICD-10-CM | POA: Diagnosis present

## 2021-03-18 DIAGNOSIS — Z20822 Contact with and (suspected) exposure to covid-19: Secondary | ICD-10-CM | POA: Diagnosis present

## 2021-03-18 DIAGNOSIS — Z885 Allergy status to narcotic agent status: Secondary | ICD-10-CM | POA: Diagnosis not present

## 2021-03-18 DIAGNOSIS — T426X6A Underdosing of other antiepileptic and sedative-hypnotic drugs, initial encounter: Secondary | ICD-10-CM | POA: Diagnosis present

## 2021-03-18 DIAGNOSIS — G4700 Insomnia, unspecified: Secondary | ICD-10-CM | POA: Diagnosis present

## 2021-03-18 DIAGNOSIS — Z880 Allergy status to penicillin: Secondary | ICD-10-CM | POA: Diagnosis not present

## 2021-03-18 DIAGNOSIS — Z66 Do not resuscitate: Secondary | ICD-10-CM | POA: Diagnosis present

## 2021-03-18 DIAGNOSIS — Y92009 Unspecified place in unspecified non-institutional (private) residence as the place of occurrence of the external cause: Secondary | ICD-10-CM | POA: Diagnosis not present

## 2021-03-18 DIAGNOSIS — R4182 Altered mental status, unspecified: Secondary | ICD-10-CM | POA: Diagnosis not present

## 2021-03-18 DIAGNOSIS — Z91138 Patient's unintentional underdosing of medication regimen for other reason: Secondary | ICD-10-CM | POA: Diagnosis not present

## 2021-03-18 DIAGNOSIS — G928 Other toxic encephalopathy: Secondary | ICD-10-CM | POA: Diagnosis present

## 2021-03-18 LAB — CBC
HCT: 48.6 % — ABNORMAL HIGH (ref 36.0–46.0)
Hemoglobin: 16.5 g/dL — ABNORMAL HIGH (ref 12.0–15.0)
MCH: 32 pg (ref 26.0–34.0)
MCHC: 34 g/dL (ref 30.0–36.0)
MCV: 94.2 fL (ref 80.0–100.0)
Platelets: 399 10*3/uL (ref 150–400)
RBC: 5.16 MIL/uL — ABNORMAL HIGH (ref 3.87–5.11)
RDW: 14.3 % (ref 11.5–15.5)
WBC: 16.5 10*3/uL — ABNORMAL HIGH (ref 4.0–10.5)
nRBC: 0 % (ref 0.0–0.2)

## 2021-03-18 LAB — VITAMIN B12: Vitamin B-12: 720 pg/mL (ref 180–914)

## 2021-03-18 LAB — COMPREHENSIVE METABOLIC PANEL
ALT: 20 U/L (ref 0–44)
AST: 77 U/L — ABNORMAL HIGH (ref 15–41)
Albumin: 4.8 g/dL (ref 3.5–5.0)
Alkaline Phosphatase: 119 U/L (ref 38–126)
Anion gap: 15 (ref 5–15)
BUN: 17 mg/dL (ref 8–23)
CO2: 24 mmol/L (ref 22–32)
Calcium: 10.3 mg/dL (ref 8.9–10.3)
Chloride: 100 mmol/L (ref 98–111)
Creatinine, Ser: 0.75 mg/dL (ref 0.44–1.00)
GFR, Estimated: >60 mL/min (ref >60)
Glucose, Bld: 139 mg/dL — ABNORMAL HIGH (ref 70–99)
Potassium: 3 mmol/L — ABNORMAL LOW (ref 3.5–5.1)
Sodium: 139 mmol/L (ref 135–145)
Total Bilirubin: 1.1 mg/dL (ref 0.3–1.2)
Total Protein: 9.8 g/dL — ABNORMAL HIGH (ref 6.5–8.1)

## 2021-03-18 LAB — TSH: TSH: 1.498 u[IU]/mL (ref 0.350–4.500)

## 2021-03-18 LAB — CBG MONITORING, ED
Glucose-Capillary: 175 mg/dL — ABNORMAL HIGH (ref 70–99)
Glucose-Capillary: 164 mg/dL — ABNORMAL HIGH (ref 70–99)

## 2021-03-18 LAB — GLUCOSE, CAPILLARY
Glucose-Capillary: 129 mg/dL — ABNORMAL HIGH (ref 70–99)
Glucose-Capillary: 158 mg/dL — ABNORMAL HIGH (ref 70–99)
Glucose-Capillary: 118 mg/dL — ABNORMAL HIGH (ref 70–99)

## 2021-03-18 LAB — MRSA NEXT GEN BY PCR, NASAL: MRSA by PCR Next Gen: NOT DETECTED

## 2021-03-18 LAB — HIV ANTIBODY (ROUTINE TESTING W REFLEX): HIV Screen 4th Generation wRfx: NONREACTIVE

## 2021-03-18 IMAGING — DX DG CHEST 1V PORT
1 series · 1 of 1 positions shown · non-contrast
Comparison: [DATE]

CLINICAL DATA: Altered mental status

EXAM:
PORTABLE CHEST 1 VIEW

[chest ap]
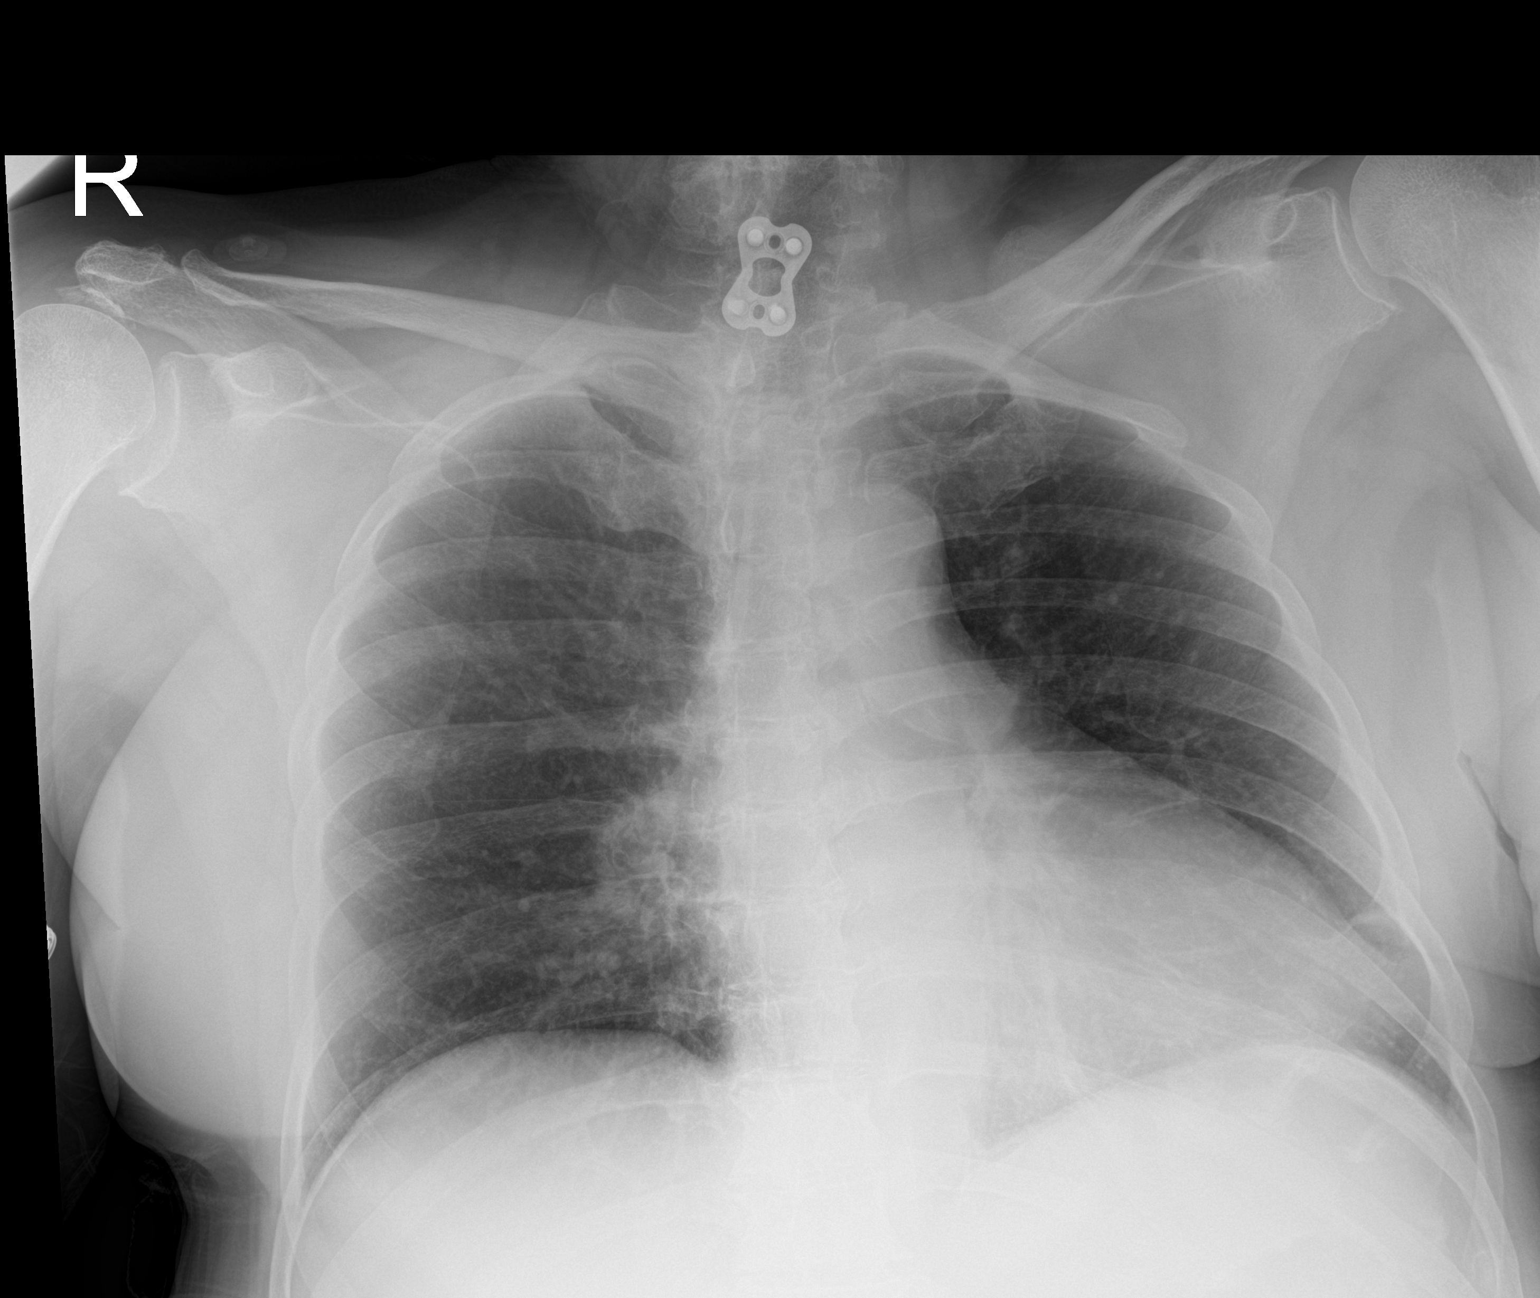

[1 of 1 positions shown; findings below may reference images not displayed]

FINDINGS: Lungs are clear.  No pleural effusion or pneumothorax.

The heart is normal in size.

Cervical spine fixation hardware.

Degenerative changes of the right shoulder.
IMPRESSION: No evidence of acute cardiopulmonary disease.

## 2021-03-18 IMAGING — DX DG ABDOMEN 1V
1 series · 1 of 1 positions shown · non-contrast
Comparison: None.

CLINICAL DATA: Nasogastric tube placement.

EXAM:
ABDOMEN - 1 VIEW

[abdomen kub]
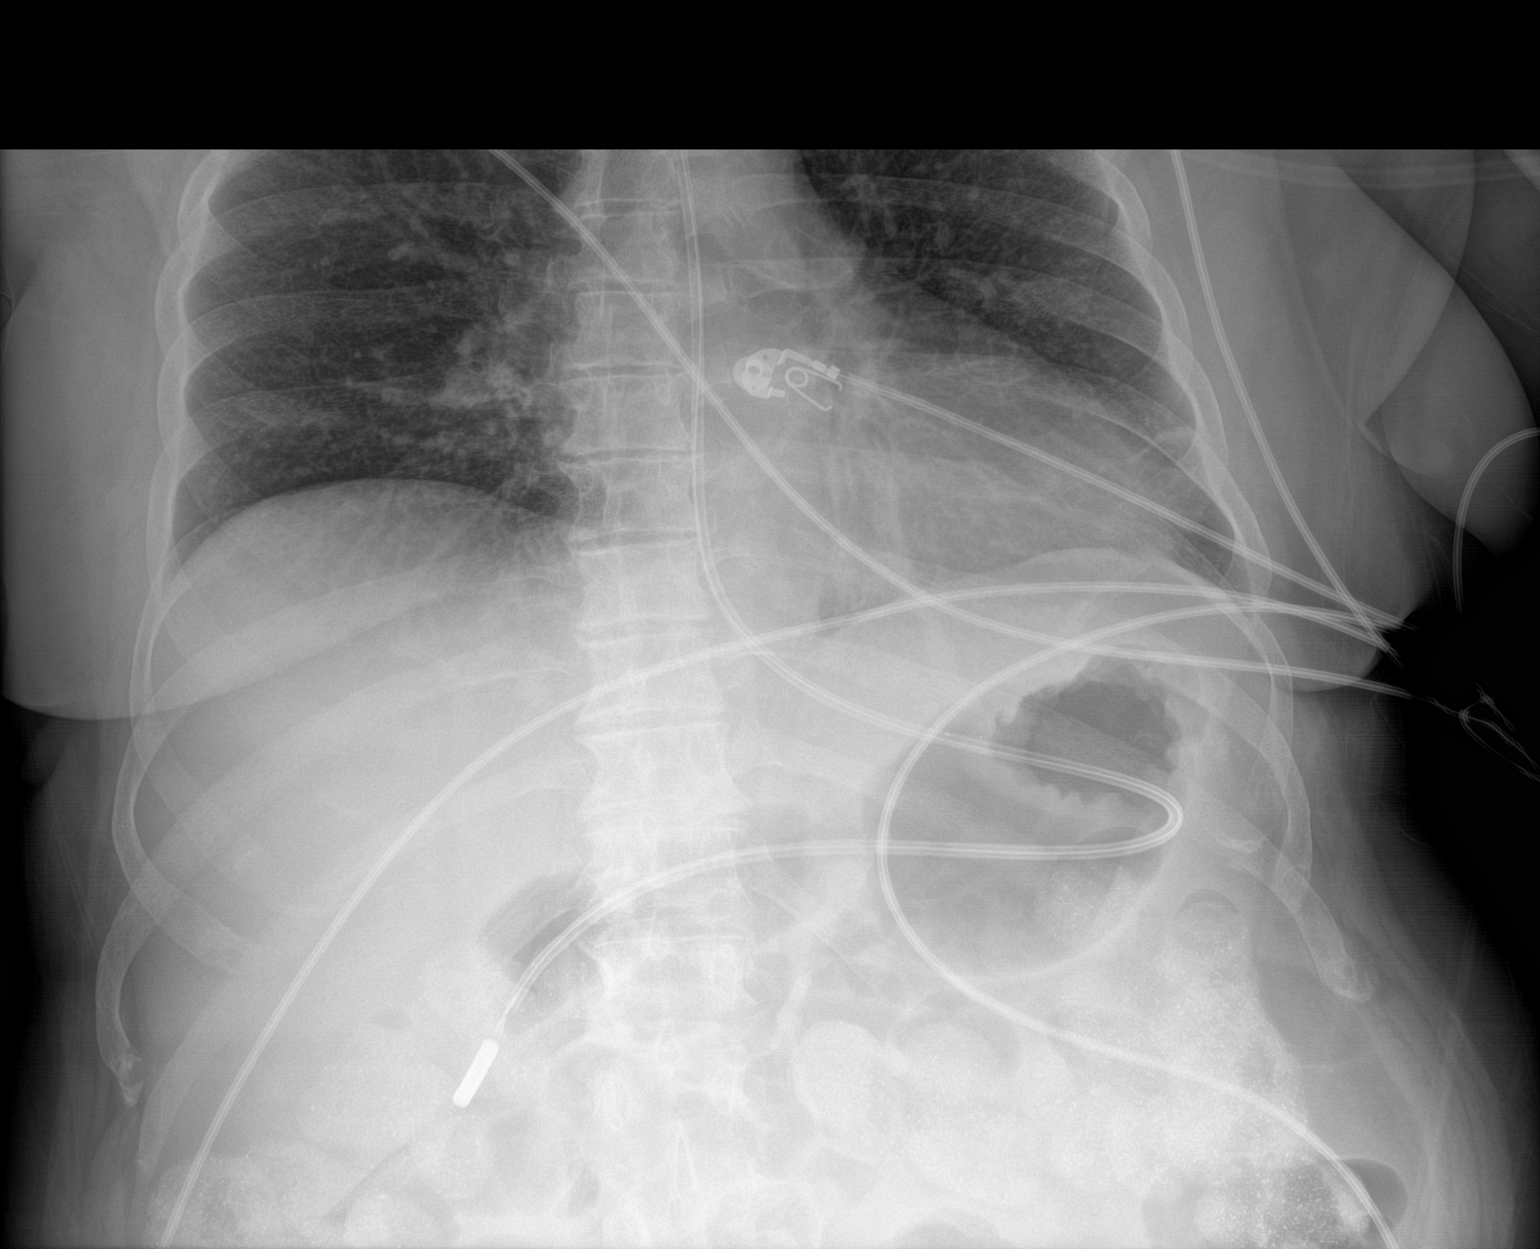

[1 of 1 positions shown; findings below may reference images not displayed]

FINDINGS: Feeding tube terminates in the descending duodenum. Linear
subsegmental volume loss in the left lung base.
IMPRESSION: Feeding tube terminates in the descending duodenum.

## 2021-03-18 MED ORDER — CARVEDILOL 12.5 MG PO TABS
12.5000 mg | ORAL_TABLET | Freq: Two times a day (BID) | ORAL | Status: DC
  Administered 2021-03-18 – 2021-03-21 (×6): 12.5 mg via ORAL
  Filled 2021-03-18 (×6): qty 1

## 2021-03-18 MED ORDER — DIAZEPAM 5 MG/ML IJ SOLN
5.0000 mg | Freq: Once | INTRAMUSCULAR | Status: AC
  Administered 2021-03-18: 5 mg via INTRAVENOUS
  Filled 2021-03-18: qty 2

## 2021-03-18 MED ORDER — BUPRENORPHINE HCL 2 MG SL SUBL
2.0000 mg | SUBLINGUAL_TABLET | Freq: Four times a day (QID) | SUBLINGUAL | Status: DC
  Administered 2021-03-18 – 2021-03-21 (×13): 2 mg via SUBLINGUAL
  Filled 2021-03-18 (×13): qty 1

## 2021-03-18 MED ORDER — GABAPENTIN 400 MG PO CAPS
800.0000 mg | ORAL_CAPSULE | Freq: Three times a day (TID) | ORAL | Status: DC
  Administered 2021-03-18 – 2021-03-21 (×10): 800 mg via ORAL
  Filled 2021-03-18 (×10): qty 2

## 2021-03-18 MED ORDER — CLONAZEPAM 0.5 MG PO TABS
0.5000 mg | ORAL_TABLET | Freq: Two times a day (BID) | ORAL | Status: DC
  Administered 2021-03-18: 0.5 mg via ORAL
  Filled 2021-03-18: qty 1

## 2021-03-18 MED ORDER — CLONAZEPAM 0.5 MG PO TABS
0.5000 mg | ORAL_TABLET | Freq: Three times a day (TID) | ORAL | Status: DC
  Administered 2021-03-18 – 2021-03-21 (×9): 0.5 mg via ORAL
  Filled 2021-03-18 (×9): qty 1

## 2021-03-18 MED ORDER — FREE WATER
100.0000 mL | Freq: Four times a day (QID) | Status: DC
  Administered 2021-03-18 (×3): 100 mL

## 2021-03-18 MED ORDER — OSMOLITE 1.2 CAL PO LIQD
1000.0000 mL | ORAL | Status: DC
  Administered 2021-03-18: 1000 mL

## 2021-03-18 MED ORDER — DULOXETINE HCL 30 MG PO CPEP
60.0000 mg | ORAL_CAPSULE | Freq: Every day | ORAL | Status: DC
  Administered 2021-03-18 – 2021-03-21 (×4): 60 mg via ORAL
  Filled 2021-03-18 (×5): qty 2

## 2021-03-18 MED ORDER — HALOPERIDOL LACTATE 5 MG/ML IJ SOLN: 2.0000 mg | Freq: Once | INTRAMUSCULAR | Status: AC

## 2021-03-18 MED ORDER — HYDRALAZINE HCL 25 MG PO TABS: 25.0000 mg | ORAL_TABLET | Freq: Four times a day (QID) | ORAL | Status: DC | PRN

## 2021-03-18 MED ORDER — PROSOURCE TF PO LIQD
45.0000 mL | Freq: Every day | ORAL | Status: DC
  Administered 2021-03-18: 45 mL
  Filled 2021-03-18: qty 45

## 2021-03-18 MED ORDER — ORAL CARE MOUTH RINSE
15.0000 mL | Freq: Two times a day (BID) | OROMUCOSAL | Status: DC
  Administered 2021-03-19 – 2021-03-20 (×4): 15 mL via OROMUCOSAL

## 2021-03-18 MED ORDER — LACTATED RINGERS IV BOLUS
1000.0000 mL | Freq: Once | INTRAVENOUS | Status: AC
  Administered 2021-03-18: 1000 mL via INTRAVENOUS

## 2021-03-18 MED ORDER — MIDAZOLAM HCL 2 MG/2ML IJ SOLN: 2.0000 mg | INTRAMUSCULAR | Status: DC | PRN

## 2021-03-18 MED ORDER — HALOPERIDOL LACTATE 5 MG/ML IJ SOLN
INTRAMUSCULAR | Status: AC
  Administered 2021-03-18: 2 mg via INTRAVENOUS
  Filled 2021-03-18: qty 1

## 2021-03-18 MED ORDER — CHLORHEXIDINE GLUCONATE 0.12 % MT SOLN
15.0000 mL | Freq: Two times a day (BID) | OROMUCOSAL | Status: DC
  Administered 2021-03-19 – 2021-03-21 (×5): 15 mL via OROMUCOSAL
  Filled 2021-03-18 (×4): qty 15

## 2021-03-18 MED ORDER — CHLORHEXIDINE GLUCONATE CLOTH 2 % EX PADS
6.0000 | MEDICATED_PAD | Freq: Every day | CUTANEOUS | Status: DC
  Administered 2021-03-18 – 2021-03-20 (×3): 6 via TOPICAL

## 2021-03-18 NOTE — Progress Notes (Signed)
Initial Nutrition Assessment  DOCUMENTATION CODES:   Not applicable  INTERVENTION:  - will order Osmolite 1.2 @ 30 ml/hr to advance by 10 ml every 4 hours to reach goal rate of 60 ml/hr with 45 ml Prosource TF once/day and 100 ml free water every 6 hours. - at goal rate, this regimen will provide 1768 kcal, 91 grams protein, and 1581 ml free water.    NUTRITION DIAGNOSIS:   Inadequate oral intake related to inability to eat as evidenced by NPO status.  GOAL:   Patient will meet greater than or equal to 90% of their needs  MONITOR:   TF tolerance, Diet advancement, Labs, Weight trends  REASON FOR ASSESSMENT:   Consult Enteral/tube feeding initiation and management  ASSESSMENT:   65 y.o. female with medical history of chronic pain, seizures, insomnia, anxiety, depression, substance abuse, recurrent UTIs, and HTN. She presented to the ED after being found to be confused and naked walking around her home. She was noted to have a fall 5 days PTA with resultant bruising to her face and extremities.  She has been NPO since admission. Orientation unable to be assessed. Patient unable to provide any information. No family or visitors present at bedside.  Patient discussed in rounds and also discussed with RN at bedside.   Patient has small bore NGT in L nare and noted to be in the descending duodenum. Patient did pull NGT part way out earlier this afternoon and no repeat imaging done since that time. No indication for requirement for post-pyloric placement.   She has not been weighed since 02/05/21 at which time she weighed 203 lb and weight had been stable x1 year prior to that.   Per notes: - acute toxic metabolic encephalopathy - hx of chronix pain   Labs reviewed; CBGs: 175, 164, 129 mg/dl, K: 3 mmol/l, AST elevated.  Medications reviewed.   NUTRITION - FOCUSED PHYSICAL EXAM:  Completed; no muscle or fat depletions, no edema at this time.   Diet Order:   Diet Order              Diet NPO time specified  Diet effective now                   EDUCATION NEEDS:   No education needs have been identified at this time  Skin:  Skin Assessment: Reviewed RN Assessment  Last BM:  PTA/unknown  Height:   Ht Readings from Last 1 Encounters:  03/12/21 5\' 6"  (1.676 m)    Weight:   Wt Readings from Last 1 Encounters:  02/05/21 92.4 kg    Estimated Nutritional Needs:  Kcal:  1650-1850 kcal Protein:  85-95 grams Fluid:  >/= 2 L/day      04/08/21, MS, RD, LDN, CNSC Inpatient Clinical Dietitian RD pager # available in AMION  After hours/weekend pager # available in [facility]

## 2021-03-18 NOTE — ED Notes (Signed)
Patient still acting erratic, attempting to get out of the bed, unable to follow commands. MD made aware.

## 2021-03-18 NOTE — Progress Notes (Signed)
PROGRESS NOTE    Stephanie Carrillo  ZOX:[id number] DOB: 1956/02/03 DOA: 03/17/2021 PCP: Lavada Mesi, MD   Chief Complaint  Patient presents with   Altered Mental Status  Brief Narrative:   65 YOF with chronic pain, hypertension on amlodipine 5 mg, anxiety/depression/insomnia, seizure disorder, history of substance abuse, on Cymbalta 60 Neurontin 800 tid, Subutex, baclofen brought to the ED with altered mental status.  Last seen normal 3 PM 8/13 and had talked on the phone at 6 PM but did not answer: On the morning of day of admission and when checked was found to be confused, naked and brought to the ED  Per chart Last week about 5 days PTA patient had a fall at home and bruised her face and extremities.  Patient was brought to the ER x-rays did not show any fracture had followed up with her primary care physician and per the healthcare power of attorney primary care physician felt that patient may have had a rotator cuff tear on the right shoulder was started on baclofen 8/13 which patient took a dose, otherwise patient has not taken her blood pressure medicine for last few days as she ran out of it,she has been taking her Suboxone, gabapentin, tizanidine and Klonopin.  Patient had a similar picture in March of this year when it was attributed to UTI.  In the ED CT head fb MRI of the brain did not show any acute finding, blood pressure poorly controlled 224/115 given IV medication usually CBC CMP done alcohol level negative COVID-negative status post 1 dose of Geodon for MRI. Patient admitted for acute encephalopathy unclear cause also seen by critical care due to severe agitation UA and labs pending.  Subjective: Seen this morning patient is agitated needing restraint on the bed nursing trying to obtain IV lines. With agitation she tachycardic 130s blood pressure 150s 160s She is able to tell me her name but not following any other instruction she is moving all her extremities. Nursing  reports she was able to take Cymbalta earlier this morning  Assessment & Plan:  Acute encephalopathy unclear source, suspecting toxic metabolic: Possible due to med withdrawal (was not opiates, benzos, gabapentin, Cymbalta) as patient told ED doctor that she stopped her medication, unsure of timeline.CT head MRI brain no acute finding.  Nonfocal on exam but appears delirious combative agitated needing restraints and is still agitated despite multiple prn Ativan, also received Valium 5 mg IV, Haldol 2 mg IV this am and last night Versed 4 mg, geodon 20 mg.  I have notified ICU team and will keep her in the stepdown unit.  Resume her home meds, Cymbalta buprenorphine, 2/3rd of home clonazepam. Holding baclofen and tizanidine.  Accelerated hypertension: Blood pressure borderline controlled, resume home Coreg, continue labetalol prn.  Hypokalemia we will replete iv.  Leukocytosis this morning 9k> 16.5k and hemoglobin up.  Suspect dehydration in the setting [place] poor oral intake.  Add IV fluid hydration/TF w NGT, watch for any fever/infection.  UA pending, chest x-ray no acute finding  Anxiety and depression/Chronic pain syndrome: Slowly resume her home meds   Diet Order             Diet NPO time specified  Diet effective now                  Patient's There is no height or weight on file to calculate BMI.  DVT prophylaxis: SCDs Start: 03/17/21 2116 Code Status:   Code Status: DNR  Family  Communication: plan of care discussed with patient at bedside. Status is: Admitted as observation Patient remains hospitalized will need at least 2 midnight stay due to ongoing agitation encephalopathy.  Changed to inpatient status. Dispo: The patient is from: Home              Anticipated d/c is to: Home              Patient currently is not medically stable to d/c.   Difficult to place patient No       Unresulted Labs (From admission, onward)     Start     Ordered   03/17/21 2117   Vitamin B1  ONCE - STAT,   STAT        03/17/21 2118   03/17/21 1533  Rapid urine drug screen (hospital performed)  ONCE - STAT,   STAT        03/17/21 1532   03/17/21 1532  Urine rapid drug screen (hosp performed)  ONCE - STAT,   STAT        03/17/21 1532   03/17/21 1532  Urinalysis, Routine w reflex microscopic  ONCE - STAT,   STAT        03/17/21 1532           Medications reviewed:  Scheduled Meds:  buprenorphine  2 mg Sublingual Q6H   carvedilol  12.5 mg Oral BID WC   Chlorhexidine Gluconate Cloth  6 each Topical Daily   clonazePAM  0.5 mg Oral BID   DULoxetine  60 mg Oral Daily   feeding supplement (PROSource TF)  45 mL Per Tube Daily   free water  100 mL Per Tube Q6H   gabapentin  800 mg Oral TID   Continuous Infusions:  feeding supplement (OSMOLITE 1.2 CAL)     Consultants:see note  Procedures:see note Antimicrobials: Anti-infectives (From admission, onward)    None      Culture/Microbiology    Component Value Date/Time   SDES  10/04/2020 1519    BLOOD LEFT HAND Performed at [facility], [address] [place], [place] [address]    SPECREQUEST  10/04/2020 1519    BOTTLES DRAWN AEROBIC ONLY Blood Culture results may not be optimal due to an inadequate volume of blood received in culture bottles Performed at [facility], [address] [place], [place] [address]    CULT  10/04/2020 1519    NO GROWTH 5 DAYS Performed at [facility], [address], [place], [place] [address]    REPTSTATUS 10/09/2020 FINAL 10/04/2020 1519    Other culture-see note  Objective: Vitals: Today's Vitals   03/18/21 1021 03/18/21 1100 03/18/21 1200 03/18/21 1300  BP: (!) 156/101 (!) 174/108 (!) 195/127 (!) 153/99  Pulse: (!) 114 (!) 114 (!) 132 (!) 113  Resp: (!) 26 (!) 25 20 (!) 24  Temp:   99 F (37.2 C)   TempSrc:   Axillary   SpO2: (!) 89% 92% 93% 91%    Intake/Output Summary (Last 24 hours) at 03/18/2021 1351 Last  data filed at 03/18/2021 1118 Gross per 24 hour  Intake 42.48 ml  Output --  Net 42.48 ml   There were no vitals filed for this visit. Weight change:   Intake/Output from previous day: No intake/output data recorded. Intake/Output this shift: Total I/O In: 42.5 [IV Piggyback:42.5] Out: -  There were no vitals filed for this visit. Examination: General exam: Confused eyes closed moving extremities not purposefully,  not following instruction on restraint  HEENT:Oral mucosa moist, Ear/Nose WNL grossly,dentition normal. Respiratory system: bilaterally diminished,no use of accessory muscle, non tender. Cardiovascular system: S1 & S2 +,No JVD. Gastrointestinal system: Abdomen soft, NT,ND, BS+. Nervous System:Alert, awake, confused, moving extremities Extremities: no edema, distal peripheral pulses palpable.  Skin: No rashes,no icterus. MSK: Normal muscle bulk,tone, power Data Reviewed: I have personally reviewed following labs and imaging studies CBC: Recent Labs  Lab 03/17/21 1605 03/17/21 1610 03/18/21 0909  WBC 9.2  --  16.5*  NEUTROABS 7.5  --   --   HGB 14.3 15.3* 16.5*  HCT 43.8 45.0 48.6*  MCV 97.1  --  94.2  PLT 239  --  399   Basic Metabolic Panel: Recent Labs  Lab 03/17/21 1605 03/17/21 1610 03/18/21 0909  NA 140 140 139  K 4.5 6.0* 3.0*  CL 105 108 100  CO2 24  --  24  GLUCOSE 115* 119* 139*  BUN 13 15 17   CREATININE 0.67 0.50 0.75  CALCIUM 9.6  --  10.3   GFR: CrCl cannot be calculated (Unknown ideal weight.). Liver Function Tests: Recent Labs  Lab 03/17/21 1605 03/18/21 0909  AST 29 77*  ALT 13 20  ALKPHOS 116 119  BILITOT 0.8 1.1  PROT 9.4* 9.8*  ALBUMIN 4.8 4.8   No results for input(s): LIPASE, AMYLASE in the last 168 hours. Recent Labs  Lab 03/17/21 2117  AMMONIA 18   Coagulation Profile: Recent Labs  Lab 03/17/21 1605  INR 0.9   Cardiac Enzymes: No results for input(s): CKTOTAL, CKMB, CKMBINDEX, TROPONINI in the last 168  hours. BNP (last 3 results) No results for input(s): PROBNP in the last 8760 hours. HbA1C: No results for input(s): HGBA1C in the last 72 hours. CBG: Recent Labs  Lab 03/17/21 1402 03/18/21 0313 03/18/21 0628 03/18/21 1159  GLUCAP 120* 175* 164* 129*   Lipid Profile: No results for input(s): CHOL, HDL, LDLCALC, TRIG, CHOLHDL, LDLDIRECT in the last 72 hours. Thyroid Function Tests: Recent Labs    03/18/21 0909  TSH 1.498   Anemia Panel: Recent Labs    03/17/21 2117 03/18/21 0909  VITAMINB12 697 720   Sepsis Labs: No results for input(s): PROCALCITON, LATICACIDVEN in the last 168 hours.  Recent Results (from the past 240 hour(s))  Resp Panel by RT-PCR (Flu A&B, Covid) Nasopharyngeal Swab     Status: None   Collection Time: 03/17/21  4:15 PM   Specimen: Nasopharyngeal Swab; Nasopharyngeal(NP) swabs in vial transport medium  Result Value Ref Range Status   SARS Coronavirus 2 by RT PCR NEGATIVE NEGATIVE Final    Comment: (NOTE) SARS-CoV-2 target nucleic acids are NOT DETECTED.  The SARS-CoV-2 RNA is generally detectable in upper respiratory specimens during the acute phase of infection. The lowest concentration of SARS-CoV-2 viral copies this assay can detect is 138 copies/mL. A negative result does not preclude SARS-Cov-2 infection and should not be used as the sole basis for treatment or other patient management decisions. A negative result may occur with  improper specimen collection/handling, submission of specimen other than nasopharyngeal swab, presence of viral mutation(s) within the areas targeted by this assay, and inadequate number of viral copies(<138 copies/mL). A negative result must be combined with clinical observations, patient history, and epidemiological information. The expected result is Negative.  Fact Sheet for Patients:  03/19/21  Fact Sheet for Healthcare Providers:   BloggerCourse.com  This test is no t yet approved or cleared by the SeriousBroker.it and  has been authorized for detection and/or diagnosis of SARS-CoV-2 by FDA under an Emergency Use Authorization (EUA). This EUA will remain  in effect (meaning this test can be used) for the duration of the COVID-19 declaration under Section 564(b)(1) of the Act, 21 U.S.C.section 360bbb-3(b)(1), unless the authorization is terminated  or revoked sooner.       Influenza A by PCR NEGATIVE NEGATIVE Final   Influenza B by PCR NEGATIVE NEGATIVE Final    Comment: (NOTE) The Xpert Xpress SARS-CoV-2/FLU/RSV plus assay is intended as an aid in the diagnosis of influenza from Nasopharyngeal swab specimens and should not be used as a sole basis for treatment. Nasal washings and aspirates are unacceptable for Xpert Xpress SARS-CoV-2/FLU/RSV testing.  Fact Sheet for Patients: BloggerCourse.comhttps://www.fda.gov/media/152166/download  Fact Sheet for Healthcare Providers: SeriousBroker.ithttps://www.fda.gov/media/152162/download  This test is not yet approved or cleared by the [place] FDA and has been authorized for detection and/or diagnosis of SARS-CoV-2 by FDA under an Emergency Use Authorization (EUA). This EUA will remain in effect (meaning this test can be used) for the duration of the COVID-19 declaration under Section 564(b)(1) of the Act, 21 U.S.C. section 360bbb-3(b)(1), unless the authorization is terminated or revoked.  Performed at [facility], [address] [place], [place] [address]   MRSA Next Gen by PCR, Nasal     Status: None   Collection Time: 03/18/21 10:02 AM   Specimen: Nasal Mucosa; Nasal Swab  Result Value Ref Range Status   MRSA by PCR Next Gen NOT DETECTED NOT DETECTED Final    Comment: (NOTE) The GeneXpert MRSA Assay (FDA approved for NASAL specimens only), is one component of a comprehensive MRSA colonization surveillance program. It is not intended  to diagnose MRSA infection nor to guide or monitor treatment for MRSA infections. Test performance is not FDA approved in patients less than 65 years old. Performed at [facility], [address] [place], [place] [address]      Radiology Studies: DG Abd 1 View  Result Date: 03/18/2021 CLINICAL DATA:  Nasogastric tube placement. EXAM: ABDOMEN - 1 VIEW COMPARISON:  None. FINDINGS: Feeding tube terminates in the descending duodenum. Linear subsegmental volume loss in the left lung base. IMPRESSION: Feeding tube terminates in the descending duodenum. Electronically Signed   By: Leanna BattlesMelinda  Blietz M.D.   On: 03/18/2021 10:42   MR BRAIN WO CONTRAST  Result Date: 03/17/2021 CLINICAL DATA:  Delirium EXAM: MRI HEAD WITHOUT CONTRAST TECHNIQUE: Multiplanar, multiecho pulse sequences of the brain and surrounding structures were obtained without intravenous contrast. COMPARISON:  01/29/2020 FINDINGS: Brain: No acute infarct, mass effect or extra-axial collection. No acute or chronic hemorrhage. There is multifocal hyperintense T2-weighted signal within the white matter. Generalized volume loss without a clear lobar predilection. The midline structures are normal. Vascular: Major flow voids are preserved. Skull and upper cervical spine: Normal calvarium and skull base. Visualized upper cervical spine and soft tissues are normal. Sinuses/Orbits:No paranasal sinus fluid levels or advanced mucosal thickening. No mastoid or middle ear effusion. Normal orbits. IMPRESSION: 1. No acute intracranial abnormality. 2. Findings of mild chronic small vessel disease and volume loss. Electronically Signed   By: Deatra RobinsonKevin  Herman M.D.   On: 03/17/2021 20:37   DG CHEST PORT 1 VIEW  Result Date: 03/18/2021 CLINICAL DATA:  Altered mental status EXAM: PORTABLE CHEST 1 VIEW COMPARISON:  10/04/2020 FINDINGS: Lungs are clear.  No pleural effusion or pneumothorax. The heart is normal in size. Cervical spine fixation  hardware. Degenerative changes of  the right shoulder. IMPRESSION: No evidence of acute cardiopulmonary disease. Electronically Signed   By: Charline Bills M.D.   On: 03/18/2021 03:43   CT HEAD CODE STROKE WO CONTRAST  Result Date: 03/17/2021 CLINICAL DATA:  Code stroke. Neuro deficit, acute, stroke suspected. Fell 5 days ago. EXAM: CT HEAD WITHOUT CONTRAST TECHNIQUE: Contiguous axial images were obtained from the base of the skull through the vertex without intravenous contrast. COMPARISON:  Head CT 10/04/2020 FINDINGS: Brain: No evidence of old or acute infarction, mass lesion, hemorrhage, hydrocephalus or extra-axial collection. Vascular: No abnormal vascular finding. Skull: Normal Sinuses/Orbits: Clear/normal Other: None ASPECTS (Alberta Stroke Program Early CT Score) - Ganglionic level infarction (caudate, lentiform nuclei, internal capsule, insula, M1-M3 cortex): 7 - Supraganglionic infarction (M4-M6 cortex): 3 Total score (0-10 with 10 being normal): 10 IMPRESSION: 1. Normal head CT. 2. ASPECTS is 10. 3. These results were called by telephone at the time of interpretation on 03/17/2021 at 3:58 pm to provider [facility] , who verbally acknowledged these results. Electronically Signed   By: Paulina Fusi M.D.   On: 03/17/2021 15:59     LOS: 0 days   Lanae Boast, MD [facility]  03/18/2021, 1:51 PM

## 2021-03-18 NOTE — ED Notes (Signed)
Patient sitting at end of the bed attempting to get up. Posey belt in place. Patient unable to follow commands or state her needs. MD made aware.

## 2021-03-18 NOTE — Progress Notes (Signed)
   NAME:  Stephanie Carrillo MRN:  [id number] DOB:  October 26, 1955 LOS: 0 ADMISSION DATE:  03/17/2021  CONSULTATION DATE:  03/18/2021 REFERRING MD:  Dr. Serita Kyle REASON FOR CONSULTATION:  agitation   Brief History   65 y.o. obese Caucasian female presented to ED for complaints of AMS. While in the ER, she received Geodon, Haldol, midazolam and Valium for psychomotor agitation.  At time of admission patient admits that she has stopped taking her home medications, although she does not answer when asked how many doses she has missed.  Her home medications include: Subutex, Klonopin, Cymbalta, Neurontin, Zanaflex and baclofen, in addition to Norvasc.    Given worsened agitation and possible need for Precedex infusion PCCM was consult to assist in management   Past Medical/Surgical/Social/Family History  Anxiety  HTN Meningitis  Seizures Substance abuse  Depression  Significant Hospital Events   8/14 Admitted due to AMS 8/15 transferred from ED to ICU  Interim History / Subjective:  Encephalopathic   Objective   Blood pressure (!) 151/110, pulse (!) 130, temperature 98.1 F (36.7 C), temperature source Oral, resp. rate (!) 24, SpO2 95 %.       No intake or output data in the 24 hours ending 03/18/21 0956 There were no vitals filed for this visit.  Examination: General: Acute on chronically ill appearing elderly female lying in bed, in NAD HEENT: [place]/AT, MM pink/moist, PERRL,  Neuro: Encephalopathic unable to determine orientation CV: s1s2 regular rate and rhythm, no murmur, rubs, or gallops,  PULM:  Clear to ascultation bilaterally, mild tachypnea  GI: soft, bowel sounds active in all 4 quadrants, non-tender, non-distended Extremities: warm/dry, no edema  Skin: no rashes or lesions  Resolved Hospital Problem list     Assessment & Plan:  Acute toxic metabolic encephalopathy  -Patient is on multiple central acting medications including Subutex, Klonopin, Cymbalta, Neurontin,  Zanaflex and baclofen. It appears that patient has been non-compliant with home medications prior to admit  Hx of chronic pain  P: Need to resume home medications when able to safely. If unable to take PO meds consider Cortrack placement  Maintain neuro protective measures; goal for eurothermia, euglycemia, eunatermia, normoxia, and PCO2 goal of 35-40 Nutrition and bowel regiment  Seizure precautions  Aspirations precautions  IV bolus now   Best Practice  Diet/type: NPO DVT prophylaxis: systemic dose LMWH GI prophylaxis: PPI Lines: N/A Foley:  N/A Code Status:  DNR Last date of multidisciplinary goals of care discussion Per primary     Signature:  Emile Ringgenberg D. Tiburcio Pea, NP-C [facility] Personal contact information can be found on Amion  03/18/2021, 10:04 AM

## 2021-03-18 NOTE — Plan of Care (Signed)

## 2021-03-18 NOTE — ED Notes (Signed)
MD at bedside with this RN. Patient unable to follow command to drink for MD and RN. MD placed drops of water into patient's mouth and she was able to swallow without difficulty.

## 2021-03-18 NOTE — ED Notes (Signed)
Pt agitated. Unable to obtain vitals

## 2021-03-18 NOTE — ED Notes (Signed)
Patient not oriented at this time. Patient unable to follow commands, attempted to get out of bed. Patient noted to be heavily bruised from previous multiple falls.

## 2021-03-18 NOTE — ED Notes (Signed)
Patient sitting at end of the bed attempting to get up. Posey belt in place. Patient unable to follow commands or state her needs. Patient noted to have removed IV from hand and has wet the bed.

## 2021-03-18 NOTE — ED Notes (Signed)
POA updated on patient's condition

## 2021-03-18 NOTE — ED Notes (Signed)
Patient still very agitated and restless, unable to follow commands, and attempting to get out of bed. Posey belt in place. MD made aware of patient's behavior despite multiple medications.

## 2021-03-18 NOTE — Consult Note (Addendum)
NAME:  Stephanie Carrillo MRN:  [id number] DOB:  1955-08-31 LOS: 0 ADMISSION DATE:  03/17/2021  CONSULTATION DATE:  03/18/2021 REFERRING MD:  Dr. Serita Kyle  REASON FOR CONSULTATION:  agitation   Initial Pulmonary/Critical Care Consultation  Brief History   N/A  History of present illness   This 65 y.o. obese Caucasian female reformed smoker is seen in consultation at the request of Dr. Midge Minium for recommendations on further evaluation and management of agitation.  The patient presented to [facility] Emergency Department yesterday on 8/14 via EMS with complaints of altered mental status ("not acting right"). The patient was admitted to the hospitalist service.  She has remained in the ER, where she has received Geodon, Haldol, midazolam and Valium for psychomotor agitation.  At the time of clinical interview, she is awake, confused, follows commands intermittent but is largely uncooperative with interview.  She allows herself to be examined.  She admits that she has stopped taking her home medications, although she does not answer when asked how many doses she has missed.  Her home medications include: Subutex, Klonopin, Cymbalta, Neurontin, Zanaflex and baclofen, in addition to Norvasc.  In the ER, she has been hypertensive, afebrile but appears flush in appearance, and she complains of dizziness.  REVIEW OF SYSTEMS This patient is critically ill and cannot provide additional history nor review of systems due to  altered mental status (confusion) .   Past Medical/Surgical/Social/Family History   Past Medical History:  Diagnosis Date   Anxiety    Depression    Hypertension    Insomnia    Meningitis    Recurrent UTI    Seizures (HCC)    Substance abuse (HCC)     Past Surgical History:  Procedure Laterality Date   BACK SURGERY     CERVICAL DISC SURGERY     FRACTURE SURGERY N/A    Phreesia 05/07/2020   SPINE SURGERY N/A    Phreesia 05/07/2020    Social History    Tobacco Use   Smoking status: Former   Smokeless tobacco: Never  Substance Use Topics   Alcohol use: Not Currently    Family History  Problem Relation Age of Onset   Multiple sclerosis Mother    Cancer Mother        Possibly breast or colon mets to brain   Cancer Father    Lung cancer Father    Heart disease Father    Cancer Brother    Testicular cancer Brother    Prostate cancer Brother    Skin cancer Brother    Colon polyps Brother    Colon polyps Brother      Significant Hospital Events     Consults:    Procedures:    Significant Diagnostic Tests:    Micro Data:   Results for orders placed or performed during the hospital encounter of 03/17/21  Resp Panel by RT-PCR (Flu A&B, Covid) Nasopharyngeal Swab     Status: None   Collection Time: 03/17/21  4:15 PM   Specimen: Nasopharyngeal Swab; Nasopharyngeal(NP) swabs in vial transport medium  Result Value Ref Range Status   SARS Coronavirus 2 by RT PCR NEGATIVE NEGATIVE Final    Comment: (NOTE) SARS-CoV-2 target nucleic acids are NOT DETECTED.  The SARS-CoV-2 RNA is generally detectable in upper respiratory specimens during the acute phase of infection. The lowest concentration of SARS-CoV-2 viral copies this assay can detect is 138 copies/mL. A negative result does not preclude SARS-Cov-2 infection and should not be  used as the sole basis for treatment or other patient management decisions. A negative result may occur with  improper specimen collection/handling, submission of specimen other than nasopharyngeal swab, presence of viral mutation(s) within the areas targeted by this assay, and inadequate number of viral copies(<138 copies/mL). A negative result must be combined with clinical observations, patient history, and epidemiological information. The expected result is Negative.  Fact Sheet for Patients:  BloggerCourse.com  Fact Sheet for Healthcare Providers:   SeriousBroker.it  This test is no t yet approved or cleared by the [place] FDA and  has been authorized for detection and/or diagnosis of SARS-CoV-2 by FDA under an Emergency Use Authorization (EUA). This EUA will remain  in effect (meaning this test can be used) for the duration of the COVID-19 declaration under Section 564(b)(1) of the Act, 21 U.S.C.section 360bbb-3(b)(1), unless the authorization is terminated  or revoked sooner.       Influenza A by PCR NEGATIVE NEGATIVE Final   Influenza B by PCR NEGATIVE NEGATIVE Final    Comment: (NOTE) The Xpert Xpress SARS-CoV-2/FLU/RSV plus assay is intended as an aid in the diagnosis of influenza from Nasopharyngeal swab specimens and should not be used as a sole basis for treatment. Nasal washings and aspirates are unacceptable for Xpert Xpress SARS-CoV-2/FLU/RSV testing.  Fact Sheet for Patients: BloggerCourse.com  Fact Sheet for Healthcare Providers: SeriousBroker.it  This test is not yet approved or cleared by the [place] FDA and has been authorized for detection and/or diagnosis of SARS-CoV-2 by FDA under an Emergency Use Authorization (EUA). This EUA will remain in effect (meaning this test can be used) for the duration of the COVID-19 declaration under Section 564(b)(1) of the Act, 21 U.S.C. section 360bbb-3(b)(1), unless the authorization is terminated or revoked.  Performed at [facility], [address] [place], [place] [address]       Antimicrobials:     Interim history/subjective:  N/A   Objective   BP (!) 160/130   Pulse 96   Temp 98.1 F (36.7 C) (Oral)   Resp (!) 22   SpO2 95%     There were no vitals filed for this visit. No intake or output data in the 24 hours ending 03/18/21 0510     Examination: GENERAL:  alert, confused, or well-developed.  Facial flushing. HEAD: normocephalic,  atraumatic EYE: PERRLA, EOM intact, no scleral icterus, no pallor. NOSE: nares are patent. No polyps. No exudate. No sinus tenderness. THROAT/ORAL CAVITY: Normal dentition. No oral thrush. No exudate. Mucous membranes are dry. No tonsillar enlargement.  NECK: supple, no thyromegaly, no JVD, no lymphadenopathy. Trachea midline. CHEST/LUNG: symmetric in development and expansion. Good air entry. No crackles. No wheezes. HEART: Regular S1 and S2 without murmur, rub or gallop. ABDOMEN: soft, nontender, nondistended. Normoactive bowel sounds. No rebound. No guarding. No hepatosplenomegaly. EXTREMITIES: Edema: none. No cyanosis. No clubbing. 2+ DP pulses LYMPHATIC: no cervical/axillary/inguinal lymph nodes appreciated MUSCULOSKELETAL: No point tenderness. No bulk atrophy. Joints: normal inspection.  SKIN:  Multiple bruises and healing lacerations predominantly on the lower extremities. NEUROLOGIC: Doll's eyes intact. Corneal reflex intact. Spontaneous respirations intact. Cranial nerves II-XII are grossly symmetric and physiologic. Babinski absent. No sensory deficit. Motor: 5/5 @ RUE, 5/5 @ LUE, 5/5 @ RLL,  5/5 @ LLL.  DTR: 2+ @ R biceps, 2+ @ L biceps, 2+ @ R patellar,  2+ @ L patellar. No cerebellar signs. Gait was not assessed.   Resolved Hospital Problem list  Assessment & Plan:   ASSESSMENT/PLAN:  ASSESSMENT (included in the Hospital Problem List)  Principal Problem:   Medication withdrawal (HCC) Active Problems:   Acute encephalopathy   Anxiety and depression   Chronic pain syndrome   Accelerated hypertension    PLAN/RECOMMENDATIONS  Clinically, this patient appears to be experiencing symptoms comaptible with withdrawal from discontinuation of Cymbalta, which is consistent with the report (corroborated by the patient) that she has not been taking her medications. Bedside swallow evaluation was performed with the RN.  The patient is able to sawllow sips of water without  chocking.  At this point, it is imperative that the patient get her home medications. I have communicated these recommendations to Dr. Toniann Fail. Transfer to ICU to not necessary at this time. Psychiatry consultation advised. Will see as needed.  Please call if needed.     My assessment, plan of care, findings, medications, side effects, etc. were discussed with: nurse and Dr. Toniann Fail (Hospitalist).   Best practice:  Diet: per primary team Pain/Anxiety/Delirium protocol (if indicated): per primary team VAP protocol (if indicated): N/A DVT prophylaxis: per primary team GI prophylaxis: per primary team Glucose control: N/A Mobility/Activity: bedrest   Code Status: DNR Family Communication:   no family at bedside Disposition: per primary team   Labs   CBC: Recent Labs  Lab 03/17/21 1605 03/17/21 1610  WBC 9.2  --   NEUTROABS 7.5  --   HGB 14.3 15.3*  HCT 43.8 45.0  MCV 97.1  --   PLT 239  --     Basic Metabolic Panel: Recent Labs  Lab 03/17/21 1605 03/17/21 1610  NA 140 140  K 4.5 6.0*  CL 105 108  CO2 24  --   GLUCOSE 115* 119*  BUN 13 15  CREATININE 0.67 0.50  CALCIUM 9.6  --    GFR: CrCl cannot be calculated (Unknown ideal weight.). Recent Labs  Lab 03/17/21 1605  WBC 9.2    Liver Function Tests: Recent Labs  Lab 03/17/21 1605  AST 29  ALT 13  ALKPHOS 116  BILITOT 0.8  PROT 9.4*  ALBUMIN 4.8   No results for input(s): LIPASE, AMYLASE in the last 168 hours. Recent Labs  Lab 03/17/21 2117  AMMONIA 18    ABG    Component Value Date/Time   TCO2 30 03/17/2021 1610     Coagulation Profile: Recent Labs  Lab 03/17/21 1605  INR 0.9    Cardiac Enzymes: No results for input(s): CKTOTAL, CKMB, CKMBINDEX, TROPONINI in the last 168 hours.  HbA1C: Hgb A1c MFr Bld  Date/Time Value Ref Range Status  10/26/2020 02:08 PM 5.1 <5.7 % of total Hgb Final    Comment:    For the purpose of screening for the presence of diabetes: . <5.7%        Consistent with the absence of diabetes 5.7-6.4%    Consistent with increased risk for diabetes             (prediabetes) > or =6.5%  Consistent with diabetes . This assay result is consistent with a decreased risk of diabetes. . Currently, no consensus exists regarding use of hemoglobin A1c for diagnosis of diabetes in children. . According to American Diabetes Association (ADA) guidelines, hemoglobin A1c <7.0% represents optimal control in non-pregnant diabetic patients. Different metrics may apply to specific patient populations.  Standards of Medical Care in Diabetes(ADA). Marland Kitchen   02/24/2020 04:51 AM 4.9 4.8 - 5.6 % Final    Comment:    (  NOTE) Pre diabetes:          5.7%-6.4%  Diabetes:              >6.4%  Glycemic control for   <7.0% adults with diabetes     CBG: Recent Labs  Lab 03/17/21 1402 03/18/21 0313  GLUCAP 120* 175*     Review of Systems:   See above   Past Medical History   Past Medical History:  Diagnosis Date   Anxiety    Depression    Hypertension    Insomnia    Meningitis    Recurrent UTI    Seizures (HCC)    Substance abuse (HCC)       Surgical History    Past Surgical History:  Procedure Laterality Date   BACK SURGERY     CERVICAL DISC SURGERY     FRACTURE SURGERY N/A    Phreesia 05/07/2020   SPINE SURGERY N/A    Phreesia 05/07/2020      Social History   Social History   Socioeconomic History   Marital status: Divorced    Spouse name: Not on file   Number of children: Not on file   Years of education: Not on file   Highest education level: Not on file  Occupational History   Not on file  Tobacco Use   Smoking status: Former   Smokeless tobacco: Never  Vaping Use   Vaping Use: Never used  Substance and Sexual Activity   Alcohol use: Not Currently   Drug use: Not Currently    Comment: opiates -clean 6 months   Sexual activity: Not on file  Other Topics Concern   Not on file  Social History Narrative    Not on file   Social Determinants of Health   Financial Resource Strain: Not on file  Food Insecurity: Not on file  Transportation Needs: Not on file  Physical Activity: Not on file  Stress: Not on file  Social Connections: Not on file      Family History    Family History  Problem Relation Age of Onset   Multiple sclerosis Mother    Cancer Mother        Possibly breast or colon mets to brain   Cancer Father    Lung cancer Father    Heart disease Father    Cancer Brother    Testicular cancer Brother    Prostate cancer Brother    Skin cancer Brother    Colon polyps Brother    Colon polyps Brother    family history includes Cancer in her brother, father, and mother; Colon polyps in her brother and brother; Heart disease in her father; Lung cancer in her father; Multiple sclerosis in her mother; Prostate cancer in her brother; Skin cancer in her brother; Testicular cancer in her brother.    Allergies Allergies  Allergen Reactions   Ciprofloxacin Shortness Of Breath and Other (See Comments)    Respiratory arrest   Other Anaphylaxis   Penicillins Shortness Of Breath and Rash    Did it involve swelling of the face/tongue/throat, SOB, or low BP? y Did it involve sudden or severe rash/hives, skin peeling, or any reaction on the inside of your mouth or nose? y Did you need to seek medical attention at a hospital or doctor's office? n When did it last happen?  1985 If all above answers are "NO", may proceed with cephalosporin use.   Oxycodone Other (See Comments)    Patient in recovery  process.      Current Medications  Current Facility-Administered Medications:    acetaminophen (TYLENOL) tablet 650 mg, 650 mg, Oral, Q6H PRN **OR** acetaminophen (TYLENOL) suppository 650 mg, 650 mg, Rectal, Q6H PRN, Eduard ClosKakrakandy, Arshad N, MD   labetalol (NORMODYNE) injection 10 mg, 10 mg, Intravenous, Q2H PRN, Eduard ClosKakrakandy, Arshad N, MD   LORazepam (ATIVAN) injection 2 mg, 2 mg, Intravenous,  Q4H PRN, Eduard ClosKakrakandy, Arshad N, MD, 2 mg at 03/18/21 0255  Current Outpatient Medications:    acetaminophen (TYLENOL) 500 MG tablet, Take 1,000 mg by mouth every 4 (four) hours., Disp: , Rfl:    amLODipine (NORVASC) 5 MG tablet, Take 1 tablet (5 mg total) by mouth daily., Disp: 30 tablet, Rfl: 0   baclofen (LIORESAL) 10 MG tablet, Take 0.5-1 tablets (5-10 mg total) by mouth 3 (three) times daily as needed for muscle spasms., Disp: 30 each, Rfl: 3   buprenorphine (SUBUTEX) 2 MG SUBL SL tablet, Place 2 mg under the tongue every 6 (six) hours., Disp: , Rfl:    clonazePAM (KLONOPIN) 0.5 MG tablet, Take 1 tablet (0.5 mg total) by mouth 3 (three) times daily as needed for anxiety., Disp: 90 tablet, Rfl: 1   DULoxetine (CYMBALTA) 60 MG capsule, Take 1 capsule (60 mg total) by mouth daily., Disp: 90 capsule, Rfl: 1   gabapentin (NEURONTIN) 800 MG tablet, Take 800 mg by mouth 3 (three) times daily., Disp: , Rfl:    magnesium gluconate (MAGONATE) 500 MG tablet, Take 500 mg by mouth 2 (two) times daily., Disp: , Rfl:    pantoprazole (PROTONIX) 40 MG tablet, Take 1 tablet (40 mg total) by mouth daily at 6 (six) AM., Disp: 30 tablet, Rfl: 1   tiZANidine (ZANAFLEX) 4 MG tablet, Take 1 tablet (4 mg total) by mouth every 8 (eight) hours as needed for muscle spasms., Disp: 90 tablet, Rfl: 3  Home Medications  Prior to Admission medications   Medication Sig Start Date End Date Taking? Authorizing Provider  acetaminophen (TYLENOL) 500 MG tablet Take 1,000 mg by mouth every 4 (four) hours.   Yes [provider]  amLODipine (NORVASC) 5 MG tablet Take 1 tablet (5 mg total) by mouth daily. 09/25/18  Yes [place], Eric J, DO  baclofen (LIORESAL) 10 MG tablet Take 0.5-1 tablets (5-10 mg total) by mouth 3 (three) times daily as needed for muscle spasms. 03/13/21  Yes Hilts, Casimiro NeedleMichael, MD  buprenorphine (SUBUTEX) 2 MG SUBL SL tablet Place 2 mg under the tongue every 6 (six) hours. 01/05/20  Yes [provider]   clonazePAM (KLONOPIN) 0.5 MG tablet Take 1 tablet (0.5 mg total) by mouth 3 (three) times daily as needed for anxiety. 01/07/21  Yes Hilts, Casimiro NeedleMichael, MD  DULoxetine (CYMBALTA) 60 MG capsule Take 1 capsule (60 mg total) by mouth daily. 01/07/21  Yes Hilts, Casimiro NeedleMichael, MD  gabapentin (NEURONTIN) 800 MG tablet Take 800 mg by mouth 3 (three) times daily.   Yes [provider]  magnesium gluconate (MAGONATE) 500 MG tablet Take 500 mg by mouth 2 (two) times daily.   Yes [provider]  pantoprazole (PROTONIX) 40 MG tablet Take 1 tablet (40 mg total) by mouth daily at 6 (six) AM. 10/08/20  Yes Rodolph Bonghompson, Daniel V, MD  tiZANidine (ZANAFLEX) 4 MG tablet Take 1 tablet (4 mg total) by mouth every 8 (eight) hours as needed for muscle spasms. 01/31/21  Yes Hilts, Casimiro NeedleMichael, MD      Critical care time: 45 minutes.  The treatment and management of the  patient's condition was required based on the threat of imminent deterioration. This time reflects time spent by the physician evaluating, providing care and managing the critically ill patient's care. The time was spent at the immediate bedside (or on the same floor/unit and dedicated to this patient's care). Time involved in separately billable procedures is NOT included int he critical care time indicated above. Family meeting and update time may be included above if and only if the patient is unable/incompetent to participate in clinical interview and/or decision making, and the discussion was necessary to determining treatment decisions.    Marcelle Smiling, MD Board Certified by the ABIM, Pulmonary Diseases & Critical Care Medicine

## 2021-03-18 NOTE — ED Notes (Signed)
Attempted to get patient to drink water to check PO status. Patient unable to follow commands to sip water. No attempt made by patient when encouraged multiple times

## 2021-03-18 NOTE — ED Notes (Signed)
Patient still restless, confused and unable to follow commands. Patient moaning and grunting, word salad. Patient unable to answer what her needs are. Attempted to reposition and comfort patient.

## 2021-03-19 DIAGNOSIS — F13239 Sedative, hypnotic or anxiolytic dependence with withdrawal, unspecified: Secondary | ICD-10-CM | POA: Diagnosis not present

## 2021-03-19 LAB — CBC
HCT: 47.6 % — ABNORMAL HIGH (ref 36.0–46.0)
Hemoglobin: 15.8 g/dL — ABNORMAL HIGH (ref 12.0–15.0)
MCH: 32.2 pg (ref 26.0–34.0)
MCHC: 33.2 g/dL (ref 30.0–36.0)
MCV: 96.9 fL (ref 80.0–100.0)
Platelets: 335 10*3/uL (ref 150–400)
RBC: 4.91 MIL/uL (ref 3.87–5.11)
RDW: 15 % (ref 11.5–15.5)
WBC: 14 10*3/uL — ABNORMAL HIGH (ref 4.0–10.5)
nRBC: 0 % (ref 0.0–0.2)

## 2021-03-19 LAB — GLUCOSE, CAPILLARY
Glucose-Capillary: 152 mg/dL — ABNORMAL HIGH (ref 70–99)
Glucose-Capillary: 126 mg/dL — ABNORMAL HIGH (ref 70–99)
Glucose-Capillary: 115 mg/dL — ABNORMAL HIGH (ref 70–99)
Glucose-Capillary: 105 mg/dL — ABNORMAL HIGH (ref 70–99)
Glucose-Capillary: 110 mg/dL — ABNORMAL HIGH (ref 70–99)
Glucose-Capillary: 119 mg/dL — ABNORMAL HIGH (ref 70–99)

## 2021-03-19 LAB — RAPID URINE DRUG SCREEN, HOSP PERFORMED
Amphetamines: NOT DETECTED
Barbiturates: NOT DETECTED
Benzodiazepines: POSITIVE — AB
Cocaine: NOT DETECTED
Opiates: NOT DETECTED
Tetrahydrocannabinol: NOT DETECTED

## 2021-03-19 LAB — COMPREHENSIVE METABOLIC PANEL
ALT: 31 U/L (ref 0–44)
AST: 135 U/L — ABNORMAL HIGH (ref 15–41)
Albumin: 3.8 g/dL (ref 3.5–5.0)
Alkaline Phosphatase: 91 U/L (ref 38–126)
Anion gap: 12 (ref 5–15)
BUN: 21 mg/dL (ref 8–23)
CO2: 27 mmol/L (ref 22–32)
Calcium: 9.2 mg/dL (ref 8.9–10.3)
Chloride: 98 mmol/L (ref 98–111)
Creatinine, Ser: 0.58 mg/dL (ref 0.44–1.00)
GFR, Estimated: >60 mL/min (ref >60)
Glucose, Bld: 118 mg/dL — ABNORMAL HIGH (ref 70–99)
Potassium: 3.1 mmol/L — ABNORMAL LOW (ref 3.5–5.1)
Sodium: 137 mmol/L (ref 135–145)
Total Bilirubin: 0.9 mg/dL (ref 0.3–1.2)
Total Protein: 8.2 g/dL — ABNORMAL HIGH (ref 6.5–8.1)

## 2021-03-19 LAB — URINALYSIS, ROUTINE W REFLEX MICROSCOPIC
Bilirubin Urine: NEGATIVE
Glucose, UA: NEGATIVE mg/dL
Ketones, ur: NEGATIVE mg/dL
Nitrite: NEGATIVE
Protein, ur: >=300 mg/dL — AB
Specific Gravity, Urine: 1.028 (ref 1.005–1.030)
pH: 6 (ref 5.0–8.0)

## 2021-03-19 MED ORDER — IBUPROFEN 200 MG PO TABS
400.0000 mg | ORAL_TABLET | Freq: Four times a day (QID) | ORAL | Status: DC | PRN
  Administered 2021-03-19 – 2021-03-20 (×2): 400 mg via ORAL
  Filled 2021-03-19 (×2): qty 2

## 2021-03-19 NOTE — Progress Notes (Signed)
Entered room at 0600 to find patient slid down in bed, bed wet from tube feed and Dobbhoff tube beside patient. Changed bed, informed on call provider, and documented. Patient is slightly more responsive, possible to not need replacement, but will defer to day-team to reassess.

## 2021-03-19 NOTE — Progress Notes (Signed)
PROGRESS NOTE   Stephanie Carrillo  IEP:[id number] DOB: [birth year] DOA: 03/17/2021 PCP: Lavada Mesi, MD   Chief Complaint  Patient presents with   Altered Mental Status  Brief Narrative:   65 YOF with chronic pain, hypertension on amlodipine 5 mg, anxiety/depression/insomnia, seizure disorder, history of substance abuse, on Cymbalta 60 Neurontin 800 tid, Subutex, baclofen brought to the ED with altered mental status.  Last seen normal 3 PM 8/13 and had talked on the phone at 6 PM but did not answer: On the morning of day of admission and when checked was found to be confused, naked and brought to the ED  Per chart Last week about 5 days PTA patient had a fall at home and bruised her face and extremities.  Patient was brought to the ER x-rays did not show any fracture had followed up with her primary care physician and per the healthcare power of attorney primary care physician felt that patient may have had a rotator cuff tear on the right shoulder was started on baclofen 8/13 which patient took a dose, otherwise patient has not taken her blood pressure medicine for last few days as she ran out of it,she has been taking her Suboxone, gabapentin, tizanidine and Klonopin.  Patient had a similar picture in March of this year when it was attributed to UTI.  In the ED CT head fb MRI of the brain did not show any acute finding, blood pressure poorly controlled 224/115 given IV medication usually CBC CMP done alcohol level negative COVID-negative status post 1 dose of Geodon for MRI. Patient admitted for acute encephalopathy unclear cause also seen by critical care due to severe agitation. Admitted to stepdown- placed NGT.  Subjective: Patient was found slid down in bed and bed wet form TF, DHT beside patient. Patient appears much more alert awake coherent and able to tell me the name and able ot take po pills POA at the bedside.  Assessment & Plan:  Acute encephalopathy unclear source, suspecting  Acute toxic metabolic  possibly due to med withdrawal (was on opiates, benzos, gabapentin, Cymbalta) as patient told ED doctor that she stopped her medication, unsure of timeline. CT head MRI brain has no acute finding, moving all her extremities. But has been delirious combative needing restraints. 8/15- started on DHT and home meds Cymbalta buprenorphine, 2/3rd of home clonazepam but DHT came out-liable to take orally continue oral regimen.  Continue supportive care.  Obtain PT OT evaluation.Holding baclofen and tizanidine. SLP eval for po.  Accelerated hypertension: Uncontrolled likely from withdrawal.  And now slowly improving continue home Coreg regimen, continueIV labetalol prn.  Hypokalemia : Replace    Leukocytosis:Could be hemoconcentration.  Repeat labs improving. watch for any fever/infection.  UA pending still- reordered. chest x-ray no acute finding Recent Labs  Lab 03/17/21 1605 03/18/21 0909 03/19/21 0913  WBC 9.2 16.5* 14.0*    Anxiety and depression/Chronic pain syndrome: Slowly resume her home meds  Deconditioning: PT OT eval  Sinus tachycardia again suspecting from withdrawal/dehydration.  Improving on Coreg.  Encourage oral hydration.  Class 1 Obesity: Patient will benefit with weight loss healthy lifestyle PCP follow-up  Diet Order             Diet NPO time specified  Diet effective now                  Patient's Body mass index is 31.1 kg/m.  DVT prophylaxis: SCDs Start: 03/17/21 2116 Code Status:   Code Status: DNR  Family Communication: plan of care discussed with patient's POA at bedside. Status is: inpatient Patient remains hospitalized will need at least 2 midnight stay due to ongoing agitation encephalopathy.    Dispo: The patient is from: Home              Anticipated d/c is to: TBD              Patient currently is not medically stable to d/c.   Difficult to place patient No   Unresulted Labs (From admission, onward)     Start     Ordered    03/20/21 0500  Basic metabolic panel  Tomorrow morning,   R       Question:  Specimen collection method  Answer:  Lab=Lab collect   03/19/21 0721   03/20/21 0500  CBC  Tomorrow morning,   R       Question:  Specimen collection method  Answer:  Lab=Lab collect   03/19/21 0721   03/19/21 0724  Urine rapid drug screen (hosp performed)  ONCE - STAT,   STAT        03/19/21 0723   03/19/21 0723  Urinalysis, Routine w reflex microscopic Urine, Clean Catch  Once,   R        03/19/21 0723   03/17/21 2117  Vitamin B1  ONCE - STAT,   STAT        03/17/21 2118   03/17/21 1533  Rapid urine drug screen (hospital performed)  ONCE - STAT,   STAT        03/17/21 1532   03/17/21 1532  Urine rapid drug screen (hosp performed)  ONCE - STAT,   STAT        03/17/21 1532   03/17/21 1532  Urinalysis, Routine w reflex microscopic Urine, Clean Catch  ONCE - STAT,   STAT        03/17/21 1532           Medications reviewed:  Scheduled Meds:  buprenorphine  2 mg Sublingual Q6H   carvedilol  12.5 mg Oral BID WC   chlorhexidine  15 mL Mouth Rinse BID   Chlorhexidine Gluconate Cloth  6 each Topical Daily   clonazePAM  0.5 mg Oral TID   DULoxetine  60 mg Oral Daily   feeding supplement (PROSource TF)  45 mL Per Tube Daily   free water  100 mL Per Tube Q6H   gabapentin  800 mg Oral TID   mouth rinse  15 mL Mouth Rinse q12n4p   Continuous Infusions:  feeding supplement (OSMOLITE 1.2 CAL) Stopped (03/19/21 0600)   Consultants:see note  Procedures:see note Antimicrobials: Anti-infectives (From admission, onward)    None      Culture/Microbiology    Component Value Date/Time   SDES  10/04/2020 1519    BLOOD LEFT HAND Performed at [facility], [address], [place], [place] [address]    SPECREQUEST  10/04/2020 1519    BOTTLES DRAWN AEROBIC ONLY Blood Culture results may not be optimal due to an inadequate volume of blood received in culture bottles Performed at [facility], [address] [place], [place] [address]    CULT  10/04/2020 1519    NO GROWTH 5 DAYS Performed at [facility], [address], [place], [place] [address]    REPTSTATUS 10/09/2020 FINAL 10/04/2020 1519    Other culture-see note  Objective: Vitals: Today's Vitals   03/19/21 0514 03/19/21 0700 03/19/21 0900  03/19/21 1000  BP: (!) 173/87  (!) 161/89 (!) 143/83  Pulse: (!) 128  (!) 110 (!) 104  Resp: 16  12 15   Temp: 98.9 F (37.2 C) 99.6 F (37.6 C)    TempSrc: Oral Axillary    SpO2: 90%  91% 92%  Weight:      Height:        Intake/Output Summary (Last 24 hours) at 03/19/2021 1113 Last data filed at 03/19/2021 0600 Gross per 24 hour  Intake 1070.15 ml  Output 775 ml  Net 295.15 ml   Filed Weights   03/18/21 1430 03/19/21 0322  Weight: 87.1 kg 87.4 kg   Weight change:   Intake/Output from previous day: 08/15 0701 - 08/16 0700 In: 1070.2 [NG/GT:1027.7; IV Piggyback:42.5] Out: 775 [Urine:775] Intake/Output this shift: No intake/output data recorded. Filed Weights   03/18/21 1430 03/19/21 0322  Weight: 87.1 kg 87.4 kg   Examination: General exam: AAOx 2, pleasant, interactive appropriately older than stated age, weak appearing. HEENT:Oral mucosa moist, Ear/Nose WNL grossly, dentition normal. Respiratory system: bilaterally diminished,  no use of accessory muscle Cardiovascular system: S1 & S2 +, No JVD,. Gastrointestinal system: Abdomen soft,NT,ND, BS+ Nervous System:Alert, awake, moving extremities and grossly nonfocal Extremities: no edema, distal peripheral pulses palpable.  Skin: No rashes,no icterus. MSK: Normal muscle bulk,tone, power   Data Reviewed: I have personally reviewed following labs and imaging studies CBC: Recent Labs  Lab 03/17/21 1605 03/17/21 1610 03/18/21 0909 03/19/21 0913  WBC 9.2  --  16.5* 14.0*  NEUTROABS 7.5  --   --   --   HGB 14.3 15.3* 16.5* 15.8*  HCT 43.8 45.0 48.6* 47.6*  MCV 97.1  --  94.2  96.9  PLT 239  --  399 335   Basic Metabolic Panel: Recent Labs  Lab 03/17/21 1605 03/17/21 1610 03/18/21 0909 03/19/21 0913  NA 140 140 139 137  K 4.5 6.0* 3.0* 3.1*  CL 105 108 100 98  CO2 24  --  24 27  GLUCOSE 115* 119* 139* 118*  BUN 13 15 17 21   CREATININE 0.67 0.50 0.75 0.58  CALCIUM 9.6  --  10.3 9.2   GFR: Estimated Creatinine Clearance: 78 mL/min (by C-G formula based on SCr of 0.58 mg/dL). Liver Function Tests: Recent Labs  Lab 03/17/21 1605 03/18/21 0909 03/19/21 0913  AST 29 77* 135*  ALT 13 20 31   ALKPHOS 116 119 91  BILITOT 0.8 1.1 0.9  PROT 9.4* 9.8* 8.2*  ALBUMIN 4.8 4.8 3.8   No results for input(s): LIPASE, AMYLASE in the last 168 hours. Recent Labs  Lab 03/17/21 2117  AMMONIA 18   Coagulation Profile: Recent Labs  Lab 03/17/21 1605  INR 0.9   Cardiac Enzymes: No results for input(s): CKTOTAL, CKMB, CKMBINDEX, TROPONINI in the last 168 hours. BNP (last 3 results) No results for input(s): PROBNP in the last 8760 hours. HbA1C: No results for input(s): HGBA1C in the last 72 hours. CBG: Recent Labs  Lab 03/18/21 1159 03/18/21 1547 03/18/21 2003 03/19/21 0401 03/19/21 0804  GLUCAP 129* 158* 118* 152* 126*   Lipid Profile: No results for input(s): CHOL, HDL, LDLCALC, TRIG, CHOLHDL, LDLDIRECT in the last 72 hours. Thyroid Function Tests: Recent Labs    03/18/21 0909  TSH 1.498   Anemia Panel: Recent Labs    03/17/21 2117 03/18/21 0909  VITAMINB12 697 720   Sepsis Labs: No results for input(s): PROCALCITON, LATICACIDVEN in the last 168 hours.  Recent Results (from the past  240 hour(s))  Resp Panel by RT-PCR (Flu A&B, Covid) Nasopharyngeal Swab     Status: None   Collection Time: 03/17/21  4:15 PM   Specimen: Nasopharyngeal Swab; Nasopharyngeal(NP) swabs in vial transport medium  Result Value Ref Range Status   SARS Coronavirus 2 by RT PCR NEGATIVE NEGATIVE Final    Comment: (NOTE) SARS-CoV-2 target nucleic acids are NOT  DETECTED.  The SARS-CoV-2 RNA is generally detectable in upper respiratory specimens during the acute phase of infection. The lowest concentration of SARS-CoV-2 viral copies this assay can detect is 138 copies/mL. A negative result does not preclude SARS-Cov-2 infection and should not be used as the sole basis for treatment or other patient management decisions. A negative result may occur with  improper specimen collection/handling, submission of specimen other than nasopharyngeal swab, presence of viral mutation(s) within the areas targeted by this assay, and inadequate number of viral copies(<138 copies/mL). A negative result must be combined with clinical observations, patient history, and epidemiological information. The expected result is Negative.  Fact Sheet for Patients:  BloggerCourse.com  Fact Sheet for Healthcare Providers:  SeriousBroker.it  This test is no t yet approved or cleared by the [place] FDA and  has been authorized for detection and/or diagnosis of SARS-CoV-2 by FDA under an Emergency Use Authorization (EUA). This EUA will remain  in effect (meaning this test can be used) for the duration of the COVID-19 declaration under Section 564(b)(1) of the Act, 21 U.S.C.section 360bbb-3(b)(1), unless the authorization is terminated  or revoked sooner.       Influenza A by PCR NEGATIVE NEGATIVE Final   Influenza B by PCR NEGATIVE NEGATIVE Final    Comment: (NOTE) The Xpert Xpress SARS-CoV-2/FLU/RSV plus assay is intended as an aid in the diagnosis of influenza from Nasopharyngeal swab specimens and should not be used as a sole basis for treatment. Nasal washings and aspirates are unacceptable for Xpert Xpress SARS-CoV-2/FLU/RSV testing.  Fact Sheet for Patients: BloggerCourse.com  Fact Sheet for Healthcare Providers: SeriousBroker.it  This test is not yet  approved or cleared by the [place] FDA and has been authorized for detection and/or diagnosis of SARS-CoV-2 by FDA under an Emergency Use Authorization (EUA). This EUA will remain in effect (meaning this test can be used) for the duration of the COVID-19 declaration under Section 564(b)(1) of the Act, 21 U.S.C. section 360bbb-3(b)(1), unless the authorization is terminated or revoked.  Performed at [facility], [address] [place], [place] [address]   MRSA Next Gen by PCR, Nasal     Status: None   Collection Time: 03/18/21 10:02 AM   Specimen: Nasal Mucosa; Nasal Swab  Result Value Ref Range Status   MRSA by PCR Next Gen NOT DETECTED NOT DETECTED Final    Comment: (NOTE) The GeneXpert MRSA Assay (FDA approved for NASAL specimens only), is one component of a comprehensive MRSA colonization surveillance program. It is not intended to diagnose MRSA infection nor to guide or monitor treatment for MRSA infections. Test performance is not FDA approved in patients less than 57 years old. Performed at [facility], [address], [place], [place] [address]      Radiology Studies: DG Abd 1 View  Result Date: 03/18/2021 CLINICAL DATA:  Nasogastric tube placement. EXAM: ABDOMEN - 1 VIEW COMPARISON:  None. FINDINGS: Feeding tube terminates in the descending duodenum. Linear subsegmental volume loss in the left lung base. IMPRESSION: Feeding tube terminates in the descending duodenum. Electronically Signed  By: Leanna BattlesMelinda  Blietz M.D.   On: 03/18/2021 10:42   MR BRAIN WO CONTRAST  Result Date: 03/17/2021 CLINICAL DATA:  Delirium EXAM: MRI HEAD WITHOUT CONTRAST TECHNIQUE: Multiplanar, multiecho pulse sequences of the brain and surrounding structures were obtained without intravenous contrast. COMPARISON:  01/29/2020 FINDINGS: Brain: No acute infarct, mass effect or extra-axial collection. No acute or chronic hemorrhage. There is multifocal  hyperintense T2-weighted signal within the white matter. Generalized volume loss without a clear lobar predilection. The midline structures are normal. Vascular: Major flow voids are preserved. Skull and upper cervical spine: Normal calvarium and skull base. Visualized upper cervical spine and soft tissues are normal. Sinuses/Orbits:No paranasal sinus fluid levels or advanced mucosal thickening. No mastoid or middle ear effusion. Normal orbits. IMPRESSION: 1. No acute intracranial abnormality. 2. Findings of mild chronic small vessel disease and volume loss. Electronically Signed   By: Deatra RobinsonKevin  Herman M.D.   On: 03/17/2021 20:37   DG CHEST PORT 1 VIEW  Result Date: 03/18/2021 CLINICAL DATA:  Altered mental status EXAM: PORTABLE CHEST 1 VIEW COMPARISON:  10/04/2020 FINDINGS: Lungs are clear.  No pleural effusion or pneumothorax. The heart is normal in size. Cervical spine fixation hardware. Degenerative changes of the right shoulder. IMPRESSION: No evidence of acute cardiopulmonary disease. Electronically Signed   By: Charline BillsSriyesh  Krishnan M.D.   On: 03/18/2021 03:43   CT HEAD CODE STROKE WO CONTRAST  Result Date: 03/17/2021 CLINICAL DATA:  Code stroke. Neuro deficit, acute, stroke suspected. Fell 5 days ago. EXAM: CT HEAD WITHOUT CONTRAST TECHNIQUE: Contiguous axial images were obtained from the base of the skull through the vertex without intravenous contrast. COMPARISON:  Head CT 10/04/2020 FINDINGS: Brain: No evidence of old or acute infarction, mass lesion, hemorrhage, hydrocephalus or extra-axial collection. Vascular: No abnormal vascular finding. Skull: Normal Sinuses/Orbits: Clear/normal Other: None ASPECTS (Alberta Stroke Program Early CT Score) - Ganglionic level infarction (caudate, lentiform nuclei, internal capsule, insula, M1-M3 cortex): 7 - Supraganglionic infarction (M4-M6 cortex): 3 Total score (0-10 with 10 being normal): 10 IMPRESSION: 1. Normal head CT. 2. ASPECTS is 10. 3. These results were  called by telephone at the time of interpretation on 03/17/2021 at 3:58 pm to provider [facility] , who verbally acknowledged these results. Electronically Signed   By: Paulina FusiMark  Shogry M.D.   On: 03/17/2021 15:59     LOS: 1 day   Lanae Boastamesh Lilou Kneip, MD [facility]  03/19/2021, 11:13 AM

## 2021-03-19 NOTE — Progress Notes (Signed)
   NAME:  Stephanie Carrillo MRN:  [id number] DOB:  04-Jul-1956 LOS: 1 ADMISSION DATE:  03/17/2021  CONSULTATION DATE:  03/18/2021 REFERRING MD:  Dr. Serita Kyle REASON FOR CONSULTATION:  agitation   Brief History   65 y.o. obese Caucasian female presented to ED for complaints of AMS. While in the ER, she received Geodon, Haldol, midazolam and Valium for psychomotor agitation.  At time of admission patient admits that she has stopped taking her home medications, although she does not answer when asked how many doses she has missed.  Her home medications include: Subutex, Klonopin, Cymbalta, Neurontin, Zanaflex and baclofen, in addition to Norvasc.    Given worsened agitation and possible need for Precedex infusion PCCM was consult to assist in management   Past Medical/Surgical/Social/Family History  Anxiety  HTN Meningitis  Seizures Substance abuse  Depression  Significant Hospital Events   8/14 Admitted due to AMS 8/15 transferred from ED to ICU  Interim History / Subjective:  Mental status markedly imrpoved. She is conversant, calm, appropriate. Endorses feeling confused.  Objective   Blood pressure (!) 173/87, pulse (!) 128, temperature 99.6 F (37.6 C), temperature source Axillary, resp. rate 16, height 5\' 6"  (1.676 m), weight 87.4 kg, SpO2 90 %.        Intake/Output Summary (Last 24 hours) at 03/19/2021 0919 Last data filed at 03/19/2021 0600 Gross per 24 hour  Intake 1070.15 ml  Output 775 ml  Net 295.15 ml   Filed Weights   03/18/21 1430 03/19/21 0322  Weight: 87.1 kg 87.4 kg    Examination: General:  Chronically ill appearing elderly female lying in bed, in NAD HEENT: [place]/AT, MM pink/moist, PERRL,  Neuro: Alert, conversant, a bit confused CV: s1s2 regular rate and rhythm, no murmur, rubs, or gallops,  PULM:  Clear to ascultation bilaterally, mild tachypnea  GI: soft, bowel sounds active in all 4 quadrants, non-tender, non-distended Extremities: warm/dry, no edema   Skin: no rashes or lesions  Resolved Hospital Problem list     Assessment & Plan:  Toxic Metabolic Encephalopathy: felt to be related to medication withdrawal (opiates, benzos, gabapentin, cymbalta) as she told doctor in ED she stopped her medications but could not give a timeline of when.  --resume home cymbalta, buprenorphine, 2/3 home dose clonazepam (listed TID PRN scheduled BID), gabapentin, hold baclofen and tizanidine for now   HTN: unclear of true HTN or related to withdrawal --start scheduled coreg - increase as needed, PRN labetalol IV and PRN oral hydralazine   Tachycardia: ST. Suspect related to withdrawal. --Coreg --Encourage PO intake  We will sign off.  Best Practice  Per Primary   Signature:  03/21/21, MD See Amion for contact info 03/19/2021, 9:19 AM

## 2021-03-19 NOTE — TOC Initial Note (Signed)
Transition of Care [facility]) - Initial/Assessment Note    Patient Details  Name: Stephanie Carrillo MRN: [id number] Date of Birth: Jun 24, 1956  [facility]) CM/SW Contact:    Golda Acre, RN Phone Number: 03/19/2021, 8:13 AM  Clinical Narrative:                  65 y.o. female with history of chronic pain, hypertension was brought to the ER after patient was found to be confused.  Per patient's healthcare power of attorney Ms. Gary Fleet who provided the history patient was last seen yesterday at around 3 PM when patient was normal.  She did talk to her last evening around 6 PM.  This morning when she tried to call her she did not answer and at around noontime went to check on her at her home when she was found to be confused and naked.  Patient was brought to the ER.   Last week about 5 days ago patient had a fall at home and bruised her face and extremities.  Patient was brought to the ER x-rays did not show any fracture had followed up with her primary care physician and per the healthcare power of attorney primary care physician felt that patient may have had a rotator cuff tear on the right shoulder was started on baclofen yesterday which patient took a dose yesterday.  Otherwise patient has not taken her blood pressure medicine for last few days as she ran out of it.  Otherwise she has been taking her Suboxone, gabapentin, tizanidine and Klonopin.  Patient had a similar picture in March of this year when it was attributed to UTI. TOC PLAN OF CARE: Following for toc needs unresponsive at this time Following for progression. Expected Discharge Plan: Home/Self Care Barriers to Discharge: Continued Medical Work up   Patient Goals and CMS Choice        Expected Discharge Plan and Services Expected Discharge Plan: Home/Self Care   Discharge Planning Services: CM Consult   Living arrangements for the past 2 months: Apartment                                       Prior Living Arrangements/Services Living arrangements for the past 2 months: Apartment Lives with:: Self Patient language and need for interpreter reviewed:: Yes              Criminal Activity/Legal Involvement Pertinent to Current Situation/Hospitalization: No - Comment as needed  Activities of Daily Living Home Assistive Devices/Equipment: Eyeglasses ADL Screening (condition at time of admission) Patient's cognitive ability adequate to safely complete daily activities?: No Is the patient deaf or have difficulty hearing?: No Does the patient have difficulty seeing, even when wearing glasses/contacts?: No Does the patient have difficulty concentrating, remembering, or making decisions?: Yes Patient able to express need for assistance with ADLs?: Yes Does the patient have difficulty dressing or bathing?: No Independently performs ADLs?: Yes (appropriate for developmental age) Does the patient have difficulty walking or climbing stairs?: No Weakness of Legs: None Weakness of Arms/Hands: None  Permission Sought/Granted                  Emotional Assessment   Attitude/Demeanor/Rapport: Unable to Assess Affect (typically observed): Unable to Assess Orientation: : Fluctuating Orientation (Suspected and/or reported Sundowners) Alcohol / Substance Use: Illicit Drugs    Admission diagnosis:  Hypertensive urgency [I16.0] Acute encephalopathy [  G93.40] Hypertension, unspecified type [I10] Patient Active Problem List   Diagnosis Date Noted   Medication withdrawal (HCC) 03/18/2021   Hypertensive urgency 03/18/2021   Accelerated hypertension 03/17/2021   Acute encephalopathy 03/17/2021   AKI (acute kidney injury) (HCC)    Fall    UTI (urinary tract infection) 10/04/2020   Nausea vomiting and diarrhea    Acute metabolic encephalopathy 02/23/2020   Benzodiazepine withdrawal (HCC) 02/01/2020   Hypokalemia    Tachycardia    Altered mental state 01/27/2020   Anxiety and  depression 09/23/2018   Insomnia 09/23/2018   Chronic pain syndrome 09/23/2018   PCP:  Lavada Mesi, MD Pharmacy:   [id number] - [place], [place] - [address] AT [facility] OF [address] & [address] [place] [id number] Phone: [phone] Fax: [phone]     Social Determinants of Health (SDOH) Interventions    Readmission Risk Interventions No flowsheet data found.

## 2021-03-19 NOTE — Progress Notes (Signed)
    OVERNIGHT PROGRESS REPORT  Notified by RN for request of Diet change. Patient is awake and oriented x 4 and has ingested fluids without difficulty in the presence of the RNs with swallowing evaluated bedside by both shift RNs.  It is also reported that she has ingested fluids throughout the evening prior to 1900 Hrs without difficulty, coughing, respiratory signs/symptoms   Chinita Greenland MSNA MSN ACNPC-AG Acute Care Nurse Practitioner [facility]

## 2021-03-20 LAB — CBC
HCT: 45.3 % (ref 36.0–46.0)
Hemoglobin: 14.8 g/dL (ref 12.0–15.0)
MCH: 32 pg (ref 26.0–34.0)
MCHC: 32.7 g/dL (ref 30.0–36.0)
MCV: 98.1 fL (ref 80.0–100.0)
Platelets: 293 10*3/uL (ref 150–400)
RBC: 4.62 MIL/uL (ref 3.87–5.11)
RDW: 14.7 % (ref 11.5–15.5)
WBC: 10.6 10*3/uL — ABNORMAL HIGH (ref 4.0–10.5)
nRBC: 0 % (ref 0.0–0.2)

## 2021-03-20 LAB — BASIC METABOLIC PANEL
Anion gap: 10 (ref 5–15)
BUN: 20 mg/dL (ref 8–23)
CO2: 29 mmol/L (ref 22–32)
Calcium: 8.9 mg/dL (ref 8.9–10.3)
Chloride: 94 mmol/L — ABNORMAL LOW (ref 98–111)
Creatinine, Ser: 0.56 mg/dL (ref 0.44–1.00)
GFR, Estimated: >60 mL/min (ref >60)
Glucose, Bld: 105 mg/dL — ABNORMAL HIGH (ref 70–99)
Potassium: 3.5 mmol/L (ref 3.5–5.1)
Sodium: 133 mmol/L — ABNORMAL LOW (ref 135–145)

## 2021-03-20 LAB — GLUCOSE, CAPILLARY
Glucose-Capillary: 99 mg/dL (ref 70–99)
Glucose-Capillary: 117 mg/dL — ABNORMAL HIGH (ref 70–99)
Glucose-Capillary: 112 mg/dL — ABNORMAL HIGH (ref 70–99)
Glucose-Capillary: 125 mg/dL — ABNORMAL HIGH (ref 70–99)
Glucose-Capillary: 120 mg/dL — ABNORMAL HIGH (ref 70–99)

## 2021-03-20 NOTE — Progress Notes (Signed)
Orthopedic Tech Progress Note Patient Details:  Stephanie Carrillo January 31, 1956 [id number]  Patient ID: [name], female   DOB: 1955/08/18, 65 y.o.   MRN: [id number]  [name] 03/20/2021, 9:03 AM Arm sling delivered to room per PT Clydie Braun. RN said she would apply.

## 2021-03-20 NOTE — Progress Notes (Signed)
Patient unable to urinate all night even after getting oob to bsc. I&O cath patient - output. Patient urine was very thick at the end of the I&O cath. Will continue to monitor.

## 2021-03-20 NOTE — Evaluation (Signed)
Physical Therapy Evaluation Patient Details Name: Stephanie Carrillo MRN: [id number] DOB: 1956/04/28 Today's Date: 03/20/2021   History of Present Illness  Patient is a 65 year old female brought to ED with AMS, suspected due to medication withdrawal. PMH includes recent fall with multiple bruises and R rotator cuff tear, chronic pain, hypertension on amlodipine 5 mg, anxiety/depression/insomnia, seizure disorder, history of substance abuse, on Cymbalta 60 Neurontin 800 tid, Subutex, baclofen  Clinical Impression  The patient reports needing a sling for RUE. [employer] notified.  Patient resides alone but reports has close friends who can help her at DC. Patient  did demonstrate mild balance deficits while ambulating in hall which should improve with more ambulation.  Pt admitted with above diagnosis.  Pt currently with functional limitations due to the deficits listed below (see PT Problem List). Pt will benefit from skilled PT to increase their independence and safety with mobility to allow discharge to the venue listed below.       Follow Up Recommendations No PT follow up    Equipment Recommendations  None recommended by PT    Recommendations for Other Services       Precautions / Restrictions Precautions Precautions: Fall Precaution Comments: Right UE sling Required Braces or Orthoses: Sling Restrictions Weight Bearing Restrictions: No      Mobility  Bed Mobility Overal bed mobility: Needs Assistance Bed Mobility: Supine to Sit     Supine to sit: Min guard;HOB elevated     General bed mobility comments: min G for safety, extra time due to not using  RUE.    Transfers Overall transfer level: Needs assistance Equipment used: None Transfers: Sit to/from Stand Sit to Stand: Min assist         General transfer comment: light min A for safety as patient is mildly unsteady  Ambulation/Gait Ambulation/Gait assistance: Min guard;Min assist Gait Distance (Feet): 200  Feet Assistive device: 1 person hand held assist;None Gait Pattern/deviations: Step-through pattern;Drifts right/left Gait velocity: decr   General Gait Details: intermitent steady assistance with mild balance losses.  Stairs            Wheelchair Mobility    Modified Rankin (Stroke Patients Only)       Balance Overall balance assessment: Needs assistance   Sitting balance-Leahy Scale: Good     Standing balance support: No upper extremity supported Standing balance-Leahy Scale: Fair Standing balance comment: static is  fair,  dynamic is poor and required stability with HHA                             Pertinent Vitals/Pain Pain Assessment: Faces Faces Pain Scale: Hurts little more Pain Location: R shoulder Pain Descriptors / Indicators: Guarding Pain Intervention(s): Monitored during session    Home Living Family/patient expects to be discharged to:: Private residence Living Arrangements: Alone Available Help at Discharge: Friend(s);Available 24 hours/day Type of Home: Apartment Home Access: Level entry     Home Layout: One level Home Equipment: Grab bars - tub/shower      Prior Function Level of Independence: Independent               Hand Dominance   Dominant Hand: Right    Extremity/Trunk Assessment   Upper Extremity Assessment Upper Extremity Assessment: RUE deficits/detail RUE Deficits / Details: recent R rotator cuff tear, hand, wrist appear intact. patient guarding with R UE tucked in to her abdomen. had sling ordered as patient left hers at home  RUE: Unable to fully assess due to pain;Unable to fully assess due to immobilization    Lower Extremity Assessment Lower Extremity Assessment: Overall WFL for tasks assessed    Cervical / Trunk Assessment Cervical / Trunk Assessment: Normal  Communication   Communication: No difficulties  Cognition Arousal/Alertness: Awake/alert Behavior During Therapy: WFL for tasks  assessed/performed Overall Cognitive Status: Within Functional Limits for tasks assessed                                        General Comments      Exercises     Assessment/Plan    PT Assessment Patient needs continued PT services  PT Problem List Decreased strength;Decreased knowledge of precautions;Decreased mobility;Decreased knowledge of use of DME;Decreased activity tolerance       PT Treatment Interventions Therapeutic activities;Gait training;Therapeutic exercise;Patient/family education;Functional mobility training    PT Goals (Current goals can be found in the Care Plan section)  Acute Rehab PT Goals Patient Stated Goal: "get out of here as soon as I can" PT Goal Formulation: With patient Time For Goal Achievement: 04/03/21 Potential to Achieve Goals: Good    Frequency Min 3X/week   Barriers to discharge        Co-evaluation   Reason for Co-Treatment: To address functional/ADL transfers PT goals addressed during session: Mobility/safety with mobility OT goals addressed during session: ADL's and self-care       AM-PAC PT "6 Clicks" Mobility  Outcome Measure Help needed turning from your back to your side while in a flat bed without using bedrails?: None Help needed moving from lying on your back to sitting on the side of a flat bed without using bedrails?: None Help needed moving to and from a bed to a chair (including a wheelchair)?: A Little Help needed standing up from a chair using your arms (e.g., wheelchair or bedside chair)?: A Little Help needed to walk in hospital room?: A Little Help needed climbing 3-5 steps with a railing? : A Lot 6 Click Score: 19    End of Session Equipment Utilized During Treatment: Gait belt Activity Tolerance: Patient tolerated treatment well Patient left: in chair;with call bell/phone within reach;with nursing/sitter in room Nurse Communication: Mobility status PT Visit Diagnosis: Difficulty in  walking, not elsewhere classified (R26.2);Unsteadiness on feet (R26.81)    Time: 9518-8416 PT Time Calculation (min) (ACUTE ONLY): 19 min   Charges:   PT Evaluation $PT Eval Low Complexity: 1 Low          Blanchard Kelch PT Acute Rehabilitation Services Pager [phone] Office [phone]   Rada Hay 03/20/2021, 1:17 PM

## 2021-03-20 NOTE — Evaluation (Signed)
Occupational Therapy Evaluation Patient Details Name: Stephanie Carrillo MRN: [id number] DOB: 09/08/1955 Today's Date: 03/20/2021    History of Present Illness Patient is a 65 year old female brought to ED with AMS, suspected due to medication withdrawal. PMH includes recent fall with multiple bruises and R rotator cuff tear, chronic pain, hypertension on amlodipine 5 mg, anxiety/depression/insomnia, seizure disorder, history of substance abuse, on Cymbalta 60 Neurontin 800 tid, Subutex, baclofen   Clinical Impression   Patient lives alone in apartment, reports she can have "24/7 assistance as much as needed" from long time friend and her friend's spouse. At baseline patient is independent with self care, does not use AD. Currently patient appears close to baseline, is mildly unsteady needing light min A for safety due to mild gait deviations with ambulation in hall towards L but able to self correct. Patient also demonstrate ability to pull up socks in sitting with L UE. Reports she has been managing ADLs since R rotator cuff tear ~1 week ago and has a follow up appointment scheduled. Did not have sling with her, had one ordered from ortho tech to utilize while in hospital. Acute OT to follow for education on compensatory strategies for self care tasks while R UE immobilized and ensure safety with overall functional transfers/ambulation, do not anticipate follow up OT at D/C at this time.     Follow Up Recommendations  Other (comment);No OT follow up (has follow up appointment with Ortho for R RC tear)    Equipment Recommendations  None recommended by OT           Precautions / Restrictions Precautions Precautions: Fall Required Braces or Orthoses: Sling (R UE) Restrictions Weight Bearing Restrictions: No      Mobility Bed Mobility Overal bed mobility: Needs Assistance Bed Mobility: Supine to Sit     Supine to sit: Min guard;HOB elevated     General bed mobility comments: min G for  safety    Transfers Overall transfer level: Needs assistance Equipment used: None Transfers: Sit to/from Stand Sit to Stand: Min assist         General transfer comment: light min A for safety as patient is mildly unsteady    Balance Overall balance assessment: Mild deficits observed, not formally tested                                         ADL either performed or assessed with clinical judgement   ADL Overall ADL's : Needs assistance/impaired     Grooming: Set up;Sitting   Upper Body Bathing: Minimal assistance;Sitting   Lower Body Bathing: Minimal assistance;Sitting/lateral leans   Upper Body Dressing : Minimal assistance;Sitting   Lower Body Dressing: Minimal assistance;Sit to/from stand;Sitting/lateral leans Lower Body Dressing Details (indicate cue type and reason): in sitting patient able to pull up socks, min A in standing for balance as patient is mildly unsteady Toilet Transfer: Minimal assistance;Ambulation Toilet Transfer Details (indicate cue type and reason): light min A as patient is mildly unsteady with slight gait deviations to left however able to self correct Toileting- Clothing Manipulation and Hygiene: Minimal assistance;Sit to/from stand       Functional mobility during ADLs: Minimal assistance                           Pertinent Vitals/Pain Pain Assessment: Faces Faces Pain Scale: Hurts little  more Pain Location: R shoulder Pain Descriptors / Indicators: Guarding Pain Intervention(s): Monitored during session     Hand Dominance Right   Extremity/Trunk Assessment Upper Extremity Assessment Upper Extremity Assessment: RUE deficits/detail RUE Deficits / Details: recent R rotator cuff tear, hand, wrist appear intact. patient guarding with R UE tucked in to her abdomen. had sling ordered as patient left hers at home RUE: Unable to fully assess due to pain;Unable to fully assess due to immobilization   Lower  Extremity Assessment Lower Extremity Assessment: Defer to PT evaluation   Cervical / Trunk Assessment Cervical / Trunk Assessment: Normal   Communication Communication Communication: No difficulties   Cognition Arousal/Alertness: Awake/alert Behavior During Therapy: WFL for tasks assessed/performed Overall Cognitive Status: Within Functional Limits for tasks assessed                                                Home Living Family/patient expects to be discharged to:: Private residence Living Arrangements: Alone Available Help at Discharge: Friend(s);Available 24 hours/day Type of Home: Apartment Home Access: Level entry     Home Layout: One level     Bathroom Shower/Tub: Producer, television/film/video: Standard     Home Equipment: Grab bars - tub/shower          Prior Functioning/Environment Level of Independence: Independent                 OT Problem List: Pain;Impaired UE functional use;Decreased range of motion;Decreased activity tolerance;Impaired balance (sitting and/or standing)      OT Treatment/Interventions: Self-care/ADL training;Balance training;Patient/family education;Therapeutic activities    OT Goals(Current goals can be found in the care plan section) Acute Rehab OT Goals Patient Stated Goal: "get out of here as soon as I can" OT Goal Formulation: With patient Time For Goal Achievement: 04/03/21 Potential to Achieve Goals: Good  OT Frequency: Min 2X/week               Co-evaluation PT/OT/SLP Co-Evaluation/Treatment: Yes Reason for Co-Treatment: To address functional/ADL transfers PT goals addressed during session: Mobility/safety with mobility OT goals addressed during session: ADL's and self-care      AM-PAC OT "6 Clicks" Daily Activity     Outcome Measure Help from another person eating meals?: None Help from another person taking care of personal grooming?: A Little Help from another person toileting,  which includes using toliet, bedpan, or urinal?: A Little Help from another person bathing (including washing, rinsing, drying)?: A Little Help from another person to put on and taking off regular upper body clothing?: A Little Help from another person to put on and taking off regular lower body clothing?: A Little 6 Click Score: 19   End of Session Equipment Utilized During Treatment: Gait belt Nurse Communication: Mobility status;Other (comment) (RN to contact ortho tech for sling)  Activity Tolerance: Patient tolerated treatment well Patient left: in chair;with call bell/phone within reach;with chair alarm set  OT Visit Diagnosis: Unsteadiness on feet (R26.81);Pain Pain - Right/Left: Right Pain - part of body: Shoulder                Time: 8185-6314 OT Time Calculation (min): 19 min Charges:  OT General Charges $OT Visit: 1 Visit OT Evaluation $OT Eval Low Complexity: 1 Low  Marlyce Huge OT OT pager: [phone]  Carmelia Roller 03/20/2021, 11:47 AM

## 2021-03-20 NOTE — Progress Notes (Signed)
[facility]  PROGRESS NOTE  Carleah Yablonski GEX:[id number] DOB: Sep 21, 1955 DOA: 03/17/2021 PCP: Lavada Mesi, MD   Brief HPI:   65 year old female with chronic pain syndrome, hypertension, seizure disorder, history of polysubstance abuse was brought to hospital for altered mental status.  Patient ran out of her medications including Suboxone, gabapentin, tizanidine, Klonopin.  CT head and MRI brain did not show any acute finding.  Patient was seen by critical care for severe agitation.  Also placed NG tube for feeding.  NG tube was removed yesterday.    Subjective   Patient seen and examined, she is back to baseline mentally.  Eating regular diet.  No difficulty swallowing.  Patient says that she ran out of her medications 3 days ago and did not take her usual pills.   Assessment/Plan:    Acute encephalopathy -Resolved -Likely medication withdrawal; patient was taking opiates, benzos, gabapentin, Cymbalta at home -CT head/MRI showed no acute finding -Started back on Cymbalta, buprenorphine, two thirds dose of clonazepam  Hypertension -Uncontrolled, likely from medication withdrawal  -Continue home regimen including Coreg  Sinus tachycardia -Likely from withdrawal from beta-blockers -Resolved  Hypokalemia -Replete  Anxiety/depression/chronic pain syndrome -Continue Cymbalta    Scheduled medications:    buprenorphine  2 mg Sublingual Q6H   carvedilol  12.5 mg Oral BID WC   chlorhexidine  15 mL Mouth Rinse BID   Chlorhexidine Gluconate Cloth  6 each Topical Daily   clonazePAM  0.5 mg Oral TID   DULoxetine  60 mg Oral Daily   gabapentin  800 mg Oral TID   mouth rinse  15 mL Mouth Rinse q12n4p         Data Reviewed:   CBG:  Recent Labs  Lab 03/19/21 1955 03/19/21 2320 03/20/21 0415 03/20/21 0718 03/20/21 1130  GLUCAP 110* 119* 99 117* 112*    SpO2: 95 %    Vitals:   03/20/21 0900 03/20/21 1000 03/20/21 1131 03/20/21 1455  BP:  (!) 162/91     Pulse: 95 86    Resp: 12 14    Temp:   97.7 F (36.5 C) (!) 97.5 F (36.4 C)  TempSrc:   Axillary Axillary  SpO2: 93% 95%    Weight:      Height:         Intake/Output Summary (Last 24 hours) at 03/20/2021 1559 Last data filed at 03/20/2021 1500 Gross per 24 hour  Intake 720 ml  Output 975 ml  Net -255 ml    08/15 1901 - 08/17 0700 In: 1750.7 [P.O.:960] Out: 1025 [Urine:1025]  Filed Weights   03/18/21 1430 03/19/21 0322 03/20/21 0500  Weight: 87.1 kg 87.4 kg 87.4 kg    CBC:  Recent Labs  Lab 03/17/21 1605 03/17/21 1610 03/18/21 0909 03/19/21 0913 03/20/21 0303  WBC 9.2  --  16.5* 14.0* 10.6*  HGB 14.3 15.3* 16.5* 15.8* 14.8  HCT 43.8 45.0 48.6* 47.6* 45.3  PLT 239  --  399 335 293  MCV 97.1  --  94.2 96.9 98.1  MCH 31.7  --  32.0 32.2 32.0  MCHC 32.6  --  34.0 33.2 32.7  RDW 14.5  --  14.3 15.0 14.7  LYMPHSABS 1.0  --   --   --   --   MONOABS 0.6  --   --   --   --   EOSABS 0.0  --   --   --   --   BASOSABS 0.0  --   --   --   --  Complete metabolic panel:  Recent Labs  Lab 03/17/21 1605 03/17/21 1610 03/17/21 2117 03/18/21 0909 03/19/21 0913 03/20/21 0303  NA 140 140  --  139 137 133*  K 4.5 6.0*  --  3.0* 3.1* 3.5  CL 105 108  --  100 98 94*  CO2 24  --   --  24 27 29   GLUCOSE 115* 119*  --  139* 118* 105*  BUN 13 15  --  17 21 20   CREATININE 0.67 0.50  --  0.75 0.58 0.56  CALCIUM 9.6  --   --  10.3 9.2 8.9  AST 29  --   --  77* 135*  --   ALT 13  --   --  20 31  --   ALKPHOS 116  --   --  119 91  --   BILITOT 0.8  --   --  1.1 0.9  --   ALBUMIN 4.8  --   --  4.8 3.8  --   INR 0.9  --   --   --   --   --   TSH  --   --  1.117 1.498  --   --   AMMONIA  --   --  18  --   --   --     No results for input(s): LIPASE, AMYLASE in the last 168 hours.  Recent Labs  Lab 03/17/21 1615  SARSCOV2NAA NEGATIVE    ------------------------------------------------------------------------------------------------------------------ No results  for input(s): CHOL, HDL, LDLCALC, TRIG, CHOLHDL, LDLDIRECT in the last 72 hours.  Lab Results  Component Value Date   HGBA1C 5.1 10/26/2020   ------------------------------------------------------------------------------------------------------------------ Recent Labs    03/18/21 0909  TSH 1.498   ------------------------------------------------------------------------------------------------------------------ Recent Labs    03/17/21 2117 03/18/21 0909  VITAMINB12 697 720    Coagulation profile Recent Labs  Lab 03/17/21 1605  INR 0.9   No results for input(s): DDIMER in the last 72 hours.  Cardiac Enzymes No results for input(s): CKTOTAL, CKMB, CKMBINDEX, TROPONINI in the last 168 hours.  ------------------------------------------------------------------------------------------------------------------    Component Value Date/Time   BNP 231.5 (H) 02/23/2020 2103     Antibiotics: Anti-infectives (From admission, onward)    None        Radiology Reports  No results found.    DVT prophylaxis: SCDs  Code Status: DNR  Family Communication: Discussed with patient's friend at bedside   Consultants: PCCM  Procedures:     Objective    Physical Examination:   General-appears in no acute distress Heart-S1-S2, regular, no murmur auscultated Lungs-clear to auscultation bilaterally, no wheezing or crackles auscultated Abdomen-soft, nontender, no organomegaly Extremities-no edema in the lower extremities Neuro-alert, oriented x3, no focal deficit noted   Status is: Inpatient  Dispo: The patient is from: Home              Anticipated d/c is to: Home              Anticipated d/c date is: 03/21/2021              Patient currently not stable for discharge  Barrier to discharge-pending PT evaluation  COVID-19 Labs  No results for input(s): DDIMER, FERRITIN, LDH, CRP in the last 72 hours.  Lab Results  Component Value Date   SARSCOV2NAA  NEGATIVE 03/17/2021   SARSCOV2NAA NEGATIVE 02/23/2020   SARSCOV2NAA NEGATIVE 01/27/2020    Microbiology  Recent Results (from the past 240 hour(s))  Resp Panel by RT-PCR (Flu A&B, Covid)  Nasopharyngeal Swab     Status: None   Collection Time: 03/17/21  4:15 PM   Specimen: Nasopharyngeal Swab; Nasopharyngeal(NP) swabs in vial transport medium  Result Value Ref Range Status   SARS Coronavirus 2 by RT PCR NEGATIVE NEGATIVE Final    Comment: (NOTE) SARS-CoV-2 target nucleic acids are NOT DETECTED.  The SARS-CoV-2 RNA is generally detectable in upper respiratory specimens during the acute phase of infection. The lowest concentration of SARS-CoV-2 viral copies this assay can detect is 138 copies/mL. A negative result does not preclude SARS-Cov-2 infection and should not be used as the sole basis for treatment or other patient management decisions. A negative result may occur with  improper specimen collection/handling, submission of specimen other than nasopharyngeal swab, presence of viral mutation(s) within the areas targeted by this assay, and inadequate number of viral copies(<138 copies/mL). A negative result must be combined with clinical observations, patient history, and epidemiological information. The expected result is Negative.  Fact Sheet for Patients:  BloggerCourse.com  Fact Sheet for Healthcare Providers:  SeriousBroker.it  This test is no t yet approved or cleared by the [place] FDA and  has been authorized for detection and/or diagnosis of SARS-CoV-2 by FDA under an Emergency Use Authorization (EUA). This EUA will remain  in effect (meaning this test can be used) for the duration of the COVID-19 declaration under Section 564(b)(1) of the Act, 21 U.S.C.section 360bbb-3(b)(1), unless the authorization is terminated  or revoked sooner.       Influenza A by PCR NEGATIVE NEGATIVE Final   Influenza B by PCR  NEGATIVE NEGATIVE Final    Comment: (NOTE) The Xpert Xpress SARS-CoV-2/FLU/RSV plus assay is intended as an aid in the diagnosis of influenza from Nasopharyngeal swab specimens and should not be used as a sole basis for treatment. Nasal washings and aspirates are unacceptable for Xpert Xpress SARS-CoV-2/FLU/RSV testing.  Fact Sheet for Patients: BloggerCourse.com  Fact Sheet for Healthcare Providers: SeriousBroker.it  This test is not yet approved or cleared by the [place] FDA and has been authorized for detection and/or diagnosis of SARS-CoV-2 by FDA under an Emergency Use Authorization (EUA). This EUA will remain in effect (meaning this test can be used) for the duration of the COVID-19 declaration under Section 564(b)(1) of the Act, 21 U.S.C. section 360bbb-3(b)(1), unless the authorization is terminated or revoked.  Performed at [facility], [address] [place], [place] [address]   MRSA Next Gen by PCR, Nasal     Status: None   Collection Time: 03/18/21 10:02 AM   Specimen: Nasal Mucosa; Nasal Swab  Result Value Ref Range Status   MRSA by PCR Next Gen NOT DETECTED NOT DETECTED Final    Comment: (NOTE) The GeneXpert MRSA Assay (FDA approved for NASAL specimens only), is one component of a comprehensive MRSA colonization surveillance program. It is not intended to diagnose MRSA infection nor to guide or monitor treatment for MRSA infections. Test performance is not FDA approved in patients less than 35 years old. Performed at [facility], [address], [place], [place] [address]              Meredeth Ide   [facility] If 7PM-7AM, please contact night-coverage at [email or web address], Office  [phone]   03/20/2021, 3:59 PM  LOS: 2 days

## 2021-03-21 LAB — GLUCOSE, CAPILLARY
Glucose-Capillary: 119 mg/dL — ABNORMAL HIGH (ref 70–99)
Glucose-Capillary: 107 mg/dL — ABNORMAL HIGH (ref 70–99)

## 2021-03-21 MED ORDER — AMLODIPINE BESYLATE 5 MG PO TABS: 5.0000 mg | ORAL_TABLET | Freq: Every day | ORAL | 2 refills | Status: DC

## 2021-03-21 MED ORDER — SULFAMETHOXAZOLE-TRIMETHOPRIM 800-160 MG PO TABS: 1.0000 | ORAL_TABLET | Freq: Two times a day (BID) | ORAL | 0 refills | Status: AC

## 2021-03-21 MED ORDER — ADULT MULTIVITAMIN W/MINERALS CH: 1.0000 | ORAL_TABLET | Freq: Every day | ORAL | Status: DC

## 2021-03-21 MED ORDER — BOOST / RESOURCE BREEZE PO LIQD CUSTOM: 1.0000 | Freq: Two times a day (BID) | ORAL | Status: DC

## 2021-03-21 MED ORDER — CARVEDILOL 12.5 MG PO TABS: 12.5000 mg | ORAL_TABLET | Freq: Two times a day (BID) | ORAL | 2 refills | Status: DC

## 2021-03-21 NOTE — TOC Progression Note (Signed)
Transition of Care [facility]) - Progression Note    Patient Details  Name: Wenona [place] MRN: [id number] Date of Birth: [birth year]  [facility]) CM/SW Contact  Golda Acre, RN Phone Number: 03/21/2021, 7:46 AM  Clinical Narrative:    Assessment/Plan:      Acute encephalopathy -Resolved -Likely medication withdrawal; patient was taking opiates, benzos, gabapentin, Cymbalta at home -CT head/MRI showed no acute finding -Started back on Cymbalta, buprenorphine, two thirds dose of clonazepam   Hypertension -Uncontrolled, likely from medication withdrawal  -Continue home regimen including Coreg   Sinus tachycardia -Likely from withdrawal from beta-blockers -Resolved   Hypokalemia -Replete   Anxiety/depression/chronic pain syndrome -Continue Cymbalta TOC PLAN OF CARE: following for toc needs and progression.  Expected Discharge Plan: Home/Self Care Barriers to Discharge: Continued Medical Work up  Expected Discharge Plan and Services Expected Discharge Plan: Home/Self Care   Discharge Planning Services: CM Consult   Living arrangements for the past 2 months: Apartment                                       Social Determinants of Health (SDOH) Interventions    Readmission Risk Interventions No flowsheet data found.

## 2021-03-21 NOTE — Discharge Summary (Signed)
Physician Discharge Summary  Stephanie Carrillo ZOX:[id number] DOB: 1956-07-02 DOA: 03/17/2021  PCP: Lavada Mesi, MD  Admit date: 03/17/2021 Discharge date: 03/21/2021  Time spent: 60 minutes  Recommendations for Outpatient Follow-up:  Follow-up PCP in 1 week Call your PCP to find urine culture results   Discharge Diagnoses:  Principal Problem:   Medication withdrawal (HCC) Active Problems:   Anxiety and depression   Chronic pain syndrome   Accelerated hypertension   Acute encephalopathy   Hypertensive urgency   Discharge Condition: Stable  Diet recommendation: Heart healthy diet  Filed Weights   03/19/21 0322 03/20/21 0500 03/21/21 0500  Weight: 87.4 kg 87.4 kg 86.8 kg    History of present illness:  65 year old female with chronic pain syndrome, hypertension, seizure disorder, history of polysubstance abuse was brought to hospital for altered mental status.  Patient ran out of her medications including Suboxone, gabapentin, tizanidine, Klonopin.   CT head and MRI brain did not show any acute finding.  Patient was seen by critical care for severe agitation.  Also placed NG tube for feeding.  NG tube was removed yesterday.    Hospital Course:   Acute encephalopathy -Resolved; presently back to baseline -Likely medication withdrawal; patient was taking opiates, benzos, gabapentin, Cymbalta at home -CT head/MRI showed no acute finding -Started back on Cymbalta, buprenorphine, clonazepam   Hypertension -Uncontrolled, likely from medication withdrawal  -Continue home regimen of amlodipine 5 mg daily, also will discharge on Coreg 12.5 mg p.o. twice daily    Sinus tachycardia -Likely from withdrawal from beta-blockers -Resolved   Hypokalemia -Replete   Anxiety/depression/chronic pain syndrome -Continue Cymbalta  Abnormal UA -Patient has abnormal UA -Also complains of increased frequency of urination, she has history of urinary retention  -She has followed up with  urology as outpatient, and was started on suppressive antibiotics 3 months ago.  Patient stopped taking Macrobid of her own. -We will discharge her on Bactrim 1 tablet p.o. twice daily, as patient does not want to stay in the hospital to follow urine culture results. -Urine culture has been obtained, she will follow-up urine culture results as outpatient.  She will follow-up with the PCP and urology.  Procedures:   Consultations:   Discharge Exam: Vitals:   03/21/21 0800 03/21/21 1129  BP: (!) 159/74   Pulse: 89   Resp: (!) 9   Temp: 98.1 F (36.7 C) 98.4 F (36.9 C)  SpO2: (!) 89%     General: Appears in no acute distress Cardiovascular: S1-S2, regular Respiratory: Clear to auscultation bilaterally  Discharge Instructions   Discharge Instructions     Diet - low sodium heart healthy   Complete by: As directed    Discharge instructions   Complete by: As directed    Ask your PCP to follow urine culture result   Increase activity slowly   Complete by: As directed       Allergies as of 03/21/2021       Reactions   Ciprofloxacin Shortness Of Breath, Other (See Comments)   Respiratory arrest   Other Anaphylaxis   Penicillins Shortness Of Breath, Rash   Did it involve swelling of the face/tongue/throat, SOB, or low BP? y Did it involve sudden or severe rash/hives, skin peeling, or any reaction on the inside of your mouth or nose? y Did you need to seek medical attention at a hospital or doctor's office? n When did it last happen?  1985 If all above answers are "NO", may proceed with cephalosporin use.  Oxycodone Other (See Comments)   Patient in recovery process.        Medication List     STOP taking these medications    baclofen 10 MG tablet Commonly known as: LIORESAL       TAKE these medications    acetaminophen 500 MG tablet Commonly known as: TYLENOL Take 1,000 mg by mouth every 4 (four) hours.   amLODipine 5 MG tablet Commonly known as:  NORVASC Take 1 tablet (5 mg total) by mouth daily.   buprenorphine 2 MG Subl SL tablet Commonly known as: SUBUTEX Place 2 mg under the tongue every 6 (six) hours.   carvedilol 12.5 MG tablet Commonly known as: COREG Take 1 tablet (12.5 mg total) by mouth 2 (two) times daily with a meal.   clonazePAM 0.5 MG tablet Commonly known as: KLONOPIN Take 1 tablet (0.5 mg total) by mouth 3 (three) times daily as needed for anxiety.   DULoxetine 60 MG capsule Commonly known as: Cymbalta Take 1 capsule (60 mg total) by mouth daily.   gabapentin 800 MG tablet Commonly known as: NEURONTIN Take 800 mg by mouth 3 (three) times daily.   magnesium gluconate 500 MG tablet Commonly known as: MAGONATE Take 500 mg by mouth 2 (two) times daily.   pantoprazole 40 MG tablet Commonly known as: PROTONIX Take 1 tablet (40 mg total) by mouth daily at 6 (six) AM.   sulfamethoxazole-trimethoprim 800-160 MG tablet Commonly known as: BACTRIM DS Take 1 tablet by mouth 2 (two) times daily for 5 days.   tiZANidine 4 MG tablet Commonly known as: Zanaflex Take 1 tablet (4 mg total) by mouth every 8 (eight) hours as needed for muscle spasms.       Allergies  Allergen Reactions   Ciprofloxacin Shortness Of Breath and Other (See Comments)    Respiratory arrest   Other Anaphylaxis   Penicillins Shortness Of Breath and Rash    Did it involve swelling of the face/tongue/throat, SOB, or low BP? y Did it involve sudden or severe rash/hives, skin peeling, or any reaction on the inside of your mouth or nose? y Did you need to seek medical attention at a hospital or doctor's office? n When did it last happen?  1985 If all above answers are "NO", may proceed with cephalosporin use.   Oxycodone Other (See Comments)    Patient in recovery process.    Follow-up Information     Hilts, Michael, MD Follow up in 1 week(s).   Specialty: Family Medicine Why: Follow-up urine culture results Contact  information: [address] [place] [address] [phone]                  The results of significant diagnostics from this hospitalization (including imaging, microbiology, ancillary and laboratory) are listed below for reference.    Significant Diagnostic Studies: DG Forearm Right  Result Date: 03/12/2021 CLINICAL DATA:  Fall with pain EXAM: RIGHT FOREARM - 2 VIEW COMPARISON:  None. FINDINGS: There is no evidence of fracture or other focal bone lesions. Soft tissues are unremarkable. IMPRESSION: Negative. Electronically Signed   By: Jasmine Pang M.D.   On: 03/12/2021 20:38   DG Abd 1 View  Result Date: 03/18/2021 CLINICAL DATA:  Nasogastric tube placement. EXAM: ABDOMEN - 1 VIEW COMPARISON:  None. FINDINGS: Feeding tube terminates in the descending duodenum. Linear subsegmental volume loss in the left lung base. IMPRESSION: Feeding tube terminates in the descending duodenum. Electronically Signed   By: Leanna Battles M.D.  On: 03/18/2021 10:42   MR BRAIN WO CONTRAST  Result Date: 03/17/2021 CLINICAL DATA:  Delirium EXAM: MRI HEAD WITHOUT CONTRAST TECHNIQUE: Multiplanar, multiecho pulse sequences of the brain and surrounding structures were obtained without intravenous contrast. COMPARISON:  01/29/2020 FINDINGS: Brain: No acute infarct, mass effect or extra-axial collection. No acute or chronic hemorrhage. There is multifocal hyperintense T2-weighted signal within the white matter. Generalized volume loss without a clear lobar predilection. The midline structures are normal. Vascular: Major flow voids are preserved. Skull and upper cervical spine: Normal calvarium and skull base. Visualized upper cervical spine and soft tissues are normal. Sinuses/Orbits:No paranasal sinus fluid levels or advanced mucosal thickening. No mastoid or middle ear effusion. Normal orbits. IMPRESSION: 1. No acute intracranial abnormality. 2. Findings of mild chronic small vessel disease and volume loss.  Electronically Signed   By: Deatra RobinsonKevin  Herman M.D.   On: 03/17/2021 20:37   DG CHEST PORT 1 VIEW  Result Date: 03/18/2021 CLINICAL DATA:  Altered mental status EXAM: PORTABLE CHEST 1 VIEW COMPARISON:  10/04/2020 FINDINGS: Lungs are clear.  No pleural effusion or pneumothorax. The heart is normal in size. Cervical spine fixation hardware. Degenerative changes of the right shoulder. IMPRESSION: No evidence of acute cardiopulmonary disease. Electronically Signed   By: Charline BillsSriyesh  Krishnan M.D.   On: 03/18/2021 03:43   DG Humerus Right  Result Date: 03/12/2021 CLINICAL DATA:  Fall with pain at the proximal humerus EXAM: RIGHT HUMERUS - 2+ VIEW COMPARISON:  None. FINDINGS: No fracture is seen. Mild amount of calcific tendinitis. Slight inferior positioning of right humeral head probably largely related to positioning. AC joint appears intact IMPRESSION: No acute osseous abnormality Electronically Signed   By: Jasmine PangKim  Fujinaga M.D.   On: 03/12/2021 20:37   CT HEAD CODE STROKE WO CONTRAST  Result Date: 03/17/2021 CLINICAL DATA:  Code stroke. Neuro deficit, acute, stroke suspected. Fell 5 days ago. EXAM: CT HEAD WITHOUT CONTRAST TECHNIQUE: Contiguous axial images were obtained from the base of the skull through the vertex without intravenous contrast. COMPARISON:  Head CT 10/04/2020 FINDINGS: Brain: No evidence of old or acute infarction, mass lesion, hemorrhage, hydrocephalus or extra-axial collection. Vascular: No abnormal vascular finding. Skull: Normal Sinuses/Orbits: Clear/normal Other: None ASPECTS (Alberta Stroke Program Early CT Score) - Ganglionic level infarction (caudate, lentiform nuclei, internal capsule, insula, M1-M3 cortex): 7 - Supraganglionic infarction (M4-M6 cortex): 3 Total score (0-10 with 10 being normal): 10 IMPRESSION: 1. Normal head CT. 2. ASPECTS is 10. 3. These results were called by telephone at the time of interpretation on 03/17/2021 at 3:58 pm to provider [facility] District[facility]OSHUA HONG , who verbally  acknowledged these results. Electronically Signed   By: Paulina FusiMark  Shogry M.D.   On: 03/17/2021 15:59    Microbiology: Recent Results (from the past 240 hour(s))  Resp Panel by RT-PCR (Flu A&B, Covid) Nasopharyngeal Swab     Status: None   Collection Time: 03/17/21  4:15 PM   Specimen: Nasopharyngeal Swab; Nasopharyngeal(NP) swabs in vial transport medium  Result Value Ref Range Status   SARS Coronavirus 2 by RT PCR NEGATIVE NEGATIVE Final    Comment: (NOTE) SARS-CoV-2 target nucleic acids are NOT DETECTED.  The SARS-CoV-2 RNA is generally detectable in upper respiratory specimens during the acute phase of infection. The lowest concentration of SARS-CoV-2 viral copies this assay can detect is 138 copies/mL. A negative result does not preclude SARS-Cov-2 infection and should not be used as the sole basis for treatment or other patient management decisions. A negative result may occur with  improper specimen collection/handling, submission of specimen other than nasopharyngeal swab, presence of viral mutation(s) within the areas targeted by this assay, and inadequate number of viral copies(<138 copies/mL). A negative result must be combined with clinical observations, patient history, and epidemiological information. The expected result is Negative.  Fact Sheet for Patients:  BloggerCourse.com  Fact Sheet for Healthcare Providers:  SeriousBroker.it  This test is no t yet approved or cleared by the [place] FDA and  has been authorized for detection and/or diagnosis of SARS-CoV-2 by FDA under an Emergency Use Authorization (EUA). This EUA will remain  in effect (meaning this test can be used) for the duration of the COVID-19 declaration under Section 564(b)(1) of the Act, 21 U.S.C.section 360bbb-3(b)(1), unless the authorization is terminated  or revoked sooner.       Influenza A by PCR NEGATIVE NEGATIVE Final   Influenza B by  PCR NEGATIVE NEGATIVE Final    Comment: (NOTE) The Xpert Xpress SARS-CoV-2/FLU/RSV plus assay is intended as an aid in the diagnosis of influenza from Nasopharyngeal swab specimens and should not be used as a sole basis for treatment. Nasal washings and aspirates are unacceptable for Xpert Xpress SARS-CoV-2/FLU/RSV testing.  Fact Sheet for Patients: BloggerCourse.com  Fact Sheet for Healthcare Providers: SeriousBroker.it  This test is not yet approved or cleared by the [place] FDA and has been authorized for detection and/or diagnosis of SARS-CoV-2 by FDA under an Emergency Use Authorization (EUA). This EUA will remain in effect (meaning this test can be used) for the duration of the COVID-19 declaration under Section 564(b)(1) of the Act, 21 U.S.C. section 360bbb-3(b)(1), unless the authorization is terminated or revoked.  Performed at [facility], [address], [place], [place] [address]   MRSA Next Gen by PCR, Nasal     Status: None   Collection Time: 03/18/21 10:02 AM   Specimen: Nasal Mucosa; Nasal Swab  Result Value Ref Range Status   MRSA by PCR Next Gen NOT DETECTED NOT DETECTED Final    Comment: (NOTE) The GeneXpert MRSA Assay (FDA approved for NASAL specimens only), is one component of a comprehensive MRSA colonization surveillance program. It is not intended to diagnose MRSA infection nor to guide or monitor treatment for MRSA infections. Test performance is not FDA approved in patients less than 31 years old. Performed at [facility], [address], [place], [place] [address]      Labs: Basic Metabolic Panel: Recent Labs  Lab 03/17/21 1605 03/17/21 1610 03/18/21 0909 03/19/21 0913 03/20/21 0303  NA 140 140 139 137 133*  K 4.5 6.0* 3.0* 3.1* 3.5  CL 105 108 100 98 94*  CO2 24  --  24 27 29   GLUCOSE 115* 119* 139* 118* 105*  BUN 13 15 17 21 20   CREATININE  0.67 0.50 0.75 0.58 0.56  CALCIUM 9.6  --  10.3 9.2 8.9   Liver Function Tests: Recent Labs  Lab 03/17/21 1605 03/18/21 0909 03/19/21 0913  AST 29 77* 135*  ALT 13 20 31   ALKPHOS 116 119 91  BILITOT 0.8 1.1 0.9  PROT 9.4* 9.8* 8.2*  ALBUMIN 4.8 4.8 3.8   No results for input(s): LIPASE, AMYLASE in the last 168 hours. Recent Labs  Lab 03/17/21 2117  AMMONIA 18   CBC: Recent Labs  Lab 03/17/21 1605 03/17/21 1610 03/18/21 0909 03/19/21 0913 03/20/21 0303  WBC 9.2  --  16.5* 14.0* 10.6*  NEUTROABS 7.5  --   --   --   --  HGB 14.3 15.3* 16.5* 15.8* 14.8  HCT 43.8 45.0 48.6* 47.6* 45.3  MCV 97.1  --  94.2 96.9 98.1  PLT 239  --  399 335 293   Cardiac Enzymes: No results for input(s): CKTOTAL, CKMB, CKMBINDEX, TROPONINI in the last 168 hours. BNP: BNP (last 3 results) No results for input(s): BNP in the last 8760 hours.  ProBNP (last 3 results) No results for input(s): PROBNP in the last 8760 hours.  CBG: Recent Labs  Lab 03/20/21 1130 03/20/21 1649 03/20/21 2111 03/21/21 0654 03/21/21 1128  GLUCAP 112* 125* 120* 119* 107*       Signed:  Meredeth Ide MD.  [facility] 03/21/2021, 11:51 AM

## 2021-03-21 NOTE — Progress Notes (Signed)
Nutrition Follow-up  DOCUMENTATION CODES:   Not applicable  INTERVENTION:  - will order Boost Breeze BID, each supplement provides 250 kcal and 9 grams of protein. - will order 1 tablet multivitamin with minerals/day.   NUTRITION DIAGNOSIS:   Increased nutrient needs related to acute illness as evidenced by estimated needs. -revised, ongoing  GOAL:   Patient will meet greater than or equal to 90% of their needs -improving  MONITOR:   PO intake, Supplement acceptance, Labs, Weight trends  ASSESSMENT:   65 y.o. female with medical history of chronic pain, seizures, insomnia, anxiety, depression, substance abuse, recurrent UTIs, and HTN. She presented to the ED after being found to be confused and naked walking around her home. She was noted to have a fall 5 days PTA with resultant bruising to her face and extremities.  Patient seen by this RD on 8/15 at which time she had small bore NGT in place and TF was initiated. Patient pulled NGT on 8/16 at ~0600.   Diet advanced from NPO to FLD on 8/16 at 2030 and to Regular yesterday at 0823. She has been eating 75-100% at meals without issue since being advanced to Regular diet.  Patient was previously disoriented, but is now a/o x4.   Weight has been stable throughout hospitalization. Mild pitting edema to RUE documented in the edema section of flow sheet.    Labs reviewed; CBG: 119 mg/dl, Na: 035 mmol/l, Cl: 94 mmol/l. Medications reviewed.     Diet Order:   Diet Order             Diet regular Room service appropriate? Yes; Fluid consistency: Thin  Diet effective now                   EDUCATION NEEDS:   No education needs have been identified at this time  Skin:  Skin Assessment: Reviewed RN Assessment  Last BM:  PTA/unknown  Height:   Ht Readings from Last 1 Encounters:  03/19/21 5\' 6"  (1.676 m)    Weight:   Wt Readings from Last 1 Encounters:  03/21/21 86.8 kg      Estimated Nutritional Needs:   Kcal:  1650-1850 kcal Protein:  85-95 grams Fluid:  >/= 2 L/day     03/23/21, MS, RD, LDN, CNSC Inpatient Clinical Dietitian RD pager # available in [facility]  After hours/weekend pager # available in [facility]

## 2021-03-22 DIAGNOSIS — G894 Chronic pain syndrome: Secondary | ICD-10-CM | POA: Diagnosis not present

## 2021-03-22 DIAGNOSIS — M5136 Other intervertebral disc degeneration, lumbar region: Secondary | ICD-10-CM | POA: Diagnosis not present

## 2021-03-22 DIAGNOSIS — Z79899 Other long term (current) drug therapy: Secondary | ICD-10-CM | POA: Diagnosis not present

## 2021-03-22 DIAGNOSIS — M2550 Pain in unspecified joint: Secondary | ICD-10-CM | POA: Diagnosis not present

## 2021-03-23 LAB — VITAMIN B1: Vitamin B1 (Thiamine): 178.6 nmol/L (ref 66.5–200.0)

## 2021-03-24 LAB — URINE CULTURE: Culture: >=100000 — AB

## 2021-03-25 ENCOUNTER — Telehealth: Payer: Self-pay | Admitting: Family Medicine

## 2021-03-25 NOTE — Telephone Encounter (Signed)
E. Coli resistant to bactrim.

## 2021-03-26 ENCOUNTER — Other Ambulatory Visit: Payer: Self-pay

## 2021-03-26 ENCOUNTER — Ambulatory Visit (INDEPENDENT_AMBULATORY_CARE_PROVIDER_SITE_OTHER): Payer: Medicare Other | Admitting: Family Medicine

## 2021-03-26 ENCOUNTER — Encounter: Payer: Self-pay | Admitting: Family Medicine

## 2021-03-26 VITALS — BP 100/55 | HR 69

## 2021-03-26 DIAGNOSIS — M25511 Pain in right shoulder: Secondary | ICD-10-CM

## 2021-03-26 DIAGNOSIS — M25531 Pain in right wrist: Secondary | ICD-10-CM | POA: Diagnosis not present

## 2021-03-26 DIAGNOSIS — R300 Dysuria: Secondary | ICD-10-CM | POA: Diagnosis not present

## 2021-03-26 DIAGNOSIS — G9340 Encephalopathy, unspecified: Secondary | ICD-10-CM

## 2021-03-26 MED ORDER — SULFAMETHOXAZOLE-TRIMETHOPRIM 800-160 MG PO TABS: 1.0000 | ORAL_TABLET | Freq: Two times a day (BID) | ORAL | 0 refills | Status: DC

## 2021-03-26 MED ORDER — CLONAZEPAM 0.5 MG PO TABS: 0.5000 mg | ORAL_TABLET | Freq: Three times a day (TID) | ORAL | 1 refills | Status: DC | PRN

## 2021-03-26 MED ORDER — DULOXETINE HCL 60 MG PO CPEP: 60.0000 mg | ORAL_CAPSULE | Freq: Every day | ORAL | 1 refills | Status: DC

## 2021-03-26 NOTE — Progress Notes (Signed)
Office Visit Note   Patient: Stephanie Carrillo           Date of Birth: 01-14-1956           MRN: [id number] Visit Date: 03/26/2021 Requested by: Lavada Mesi, MD [address] [place],  [place] [address] PCP: Lavada Mesi, MD  Subjective: Chief Complaint  Patient presents with   Other    Post hospital check. Requests a refill on the Bactrim -- this helped her.     HPI: She is here for hospital follow-up.  She was admitted to the hospital August 14 with mental status change.  She was discharged home on August 18.  Reason for her mental status change was a bit unclear, it was that first thought to be due to not taking her usual medications but patient was not out of her medications and states that she was taking them as prescribed.  Patient states that for the past few months she has been on Macrobid for urinary tract infection suppression.  She states that it was causing severe diarrhea.  Her electrolytes were out of balance in the hospital, and patient wonders whether this could be contributing to her mental status change.  Although she is tired, she is feeling better.  She states that she is now on Bactrim and is tolerating it very well, and her urine is finally clear and she is asymptomatic for the first time in a long time.  Her urine culture came back showing E. coli resistant to Bactrim and Cipro.  Patient denies fevers or chills.  She continues to have right shoulder pain status post fall a couple weeks ago.  She also has ongoing right wrist pain.  She is barely able to move her shoulder.               ROS:   All other systems were reviewed and are negative.  Objective: Vital Signs: BP (!) 100/55 (BP Location: Left Arm, Patient Position: Sitting, Cuff Size: Normal)   Pulse 69   Physical Exam:  General:  Alert and oriented, in no acute distress. Pulm:  Breathing unlabored. Psy:  Normal mood, congruent affect.  Right shoulder: She has good strength still with isometric internal  and external rotation, but is unable to AB duct her shoulder. Right wrist: Very tender at the dorsal aspect of the distal radius.  Also tender in the wrist itself.    Imaging: No results found.  Assessment & Plan: Improving status post hospitalization for mental status change -We will continue Bactrim for UTI, even though culture sensitivities showed resistance.  She is asymptomatic on this medication.  2.  Right shoulder pain concerning for supraspinatus tear -MRI to evaluate.  Follow-up with Dr. August Saucer if tear confirmed.  3.  Ongoing right wrist pain status post fall - MRI ordered.     Procedures: No procedures performed        PMFS History: Patient Active Problem List   Diagnosis Date Noted   Medication withdrawal (HCC) 03/18/2021   Hypertensive urgency 03/18/2021   Accelerated hypertension 03/17/2021   Acute encephalopathy 03/17/2021   AKI (acute kidney injury) (HCC)    Fall    UTI (urinary tract infection) 10/04/2020   Nausea vomiting and diarrhea    Acute metabolic encephalopathy 02/23/2020   Benzodiazepine withdrawal (HCC) 02/01/2020   Hypokalemia    Tachycardia    Altered mental state 01/27/2020   Anxiety and depression 09/23/2018   Insomnia 09/23/2018   Chronic pain syndrome 09/23/2018  Past Medical History:  Diagnosis Date   Anxiety    Depression    Hypertension    Insomnia    Meningitis    Recurrent UTI    Seizures (HCC)    Substance abuse (HCC)     Family History  Problem Relation Age of Onset   Multiple sclerosis Mother    Cancer Mother        Possibly breast or colon mets to brain   Cancer Father    Lung cancer Father    Heart disease Father    Cancer Brother    Testicular cancer Brother    Prostate cancer Brother    Skin cancer Brother    Colon polyps Brother    Colon polyps Brother     Past Surgical History:  Procedure Laterality Date   BACK SURGERY     CERVICAL DISC SURGERY     FRACTURE SURGERY N/A    [employer] 05/07/2020    SPINE SURGERY N/A    [employer] 05/07/2020   Social History   Occupational History   Not on file  Tobacco Use   Smoking status: Former   Smokeless tobacco: Never  Building services engineer Use: Never used  Substance and Sexual Activity   Alcohol use: Not Currently   Drug use: Not Currently    Comment: opiates -clean 6 months   Sexual activity: Not on file

## 2021-03-28 ENCOUNTER — Inpatient Hospital Stay (HOSPITAL_COMMUNITY)
Admission: EM | Admit: 2021-03-28 | Discharge: 2021-04-05 | DRG: 871 | Disposition: A | Discharge status: Skilled Nursing Facility | Payer: Medicare Other | Attending: Family Medicine | Admitting: Family Medicine

## 2021-03-28 ENCOUNTER — Other Ambulatory Visit: Payer: Self-pay

## 2021-03-28 ENCOUNTER — Encounter (HOSPITAL_COMMUNITY): Payer: Self-pay | Admitting: Internal Medicine

## 2021-03-28 ENCOUNTER — Emergency Department (HOSPITAL_COMMUNITY): Payer: Medicare Other

## 2021-03-28 DIAGNOSIS — Z885 Allergy status to narcotic agent status: Secondary | ICD-10-CM

## 2021-03-28 DIAGNOSIS — Z66 Do not resuscitate: Secondary | ICD-10-CM | POA: Diagnosis not present

## 2021-03-28 DIAGNOSIS — G9341 Metabolic encephalopathy: Secondary | ICD-10-CM | POA: Diagnosis present

## 2021-03-28 DIAGNOSIS — Z880 Allergy status to penicillin: Secondary | ICD-10-CM | POA: Diagnosis not present

## 2021-03-28 DIAGNOSIS — Z881 Allergy status to other antibiotic agents status: Secondary | ICD-10-CM

## 2021-03-28 DIAGNOSIS — A4151 Sepsis due to Escherichia coli [E. coli]: Principal | ICD-10-CM | POA: Diagnosis present

## 2021-03-28 DIAGNOSIS — B9620 Unspecified Escherichia coli [E. coli] as the cause of diseases classified elsewhere: Secondary | ICD-10-CM

## 2021-03-28 DIAGNOSIS — Z20822 Contact with and (suspected) exposure to covid-19: Secondary | ICD-10-CM | POA: Diagnosis not present

## 2021-03-28 DIAGNOSIS — I160 Hypertensive urgency: Secondary | ICD-10-CM | POA: Diagnosis not present

## 2021-03-28 DIAGNOSIS — E872 Acidosis, unspecified: Secondary | ICD-10-CM | POA: Diagnosis present

## 2021-03-28 DIAGNOSIS — Z87440 Personal history of urinary (tract) infections: Secondary | ICD-10-CM | POA: Diagnosis not present

## 2021-03-28 DIAGNOSIS — R7881 Bacteremia: Secondary | ICD-10-CM | POA: Diagnosis not present

## 2021-03-28 DIAGNOSIS — R2681 Unsteadiness on feet: Secondary | ICD-10-CM | POA: Diagnosis not present

## 2021-03-28 DIAGNOSIS — G894 Chronic pain syndrome: Secondary | ICD-10-CM | POA: Diagnosis not present

## 2021-03-28 DIAGNOSIS — R410 Disorientation, unspecified: Secondary | ICD-10-CM | POA: Diagnosis not present

## 2021-03-28 DIAGNOSIS — Z79899 Other long term (current) drug therapy: Secondary | ICD-10-CM

## 2021-03-28 DIAGNOSIS — R6520 Severe sepsis without septic shock: Secondary | ICD-10-CM | POA: Diagnosis not present

## 2021-03-28 DIAGNOSIS — A419 Sepsis, unspecified organism: Secondary | ICD-10-CM

## 2021-03-28 DIAGNOSIS — R0902 Hypoxemia: Secondary | ICD-10-CM | POA: Diagnosis not present

## 2021-03-28 DIAGNOSIS — R278 Other lack of coordination: Secondary | ICD-10-CM | POA: Diagnosis not present

## 2021-03-28 DIAGNOSIS — Z1623 Resistance to quinolones and fluoroquinolones: Secondary | ICD-10-CM | POA: Diagnosis present

## 2021-03-28 DIAGNOSIS — Z8249 Family history of ischemic heart disease and other diseases of the circulatory system: Secondary | ICD-10-CM

## 2021-03-28 DIAGNOSIS — I10 Essential (primary) hypertension: Secondary | ICD-10-CM | POA: Diagnosis present

## 2021-03-28 DIAGNOSIS — G9340 Encephalopathy, unspecified: Secondary | ICD-10-CM | POA: Diagnosis not present

## 2021-03-28 DIAGNOSIS — Z87892 Personal history of anaphylaxis: Secondary | ICD-10-CM

## 2021-03-28 DIAGNOSIS — Z1629 Resistance to other single specified antibiotic: Secondary | ICD-10-CM | POA: Diagnosis present

## 2021-03-28 DIAGNOSIS — Z743 Need for continuous supervision: Secondary | ICD-10-CM | POA: Diagnosis not present

## 2021-03-28 DIAGNOSIS — M75101 Unspecified rotator cuff tear or rupture of right shoulder, not specified as traumatic: Secondary | ICD-10-CM | POA: Diagnosis present

## 2021-03-28 DIAGNOSIS — M6281 Muscle weakness (generalized): Secondary | ICD-10-CM | POA: Diagnosis not present

## 2021-03-28 DIAGNOSIS — F32A Depression, unspecified: Secondary | ICD-10-CM | POA: Diagnosis present

## 2021-03-28 DIAGNOSIS — F1120 Opioid dependence, uncomplicated: Secondary | ICD-10-CM | POA: Diagnosis present

## 2021-03-28 DIAGNOSIS — F419 Anxiety disorder, unspecified: Secondary | ICD-10-CM | POA: Diagnosis not present

## 2021-03-28 DIAGNOSIS — F1129 Opioid dependence with unspecified opioid-induced disorder: Secondary | ICD-10-CM | POA: Diagnosis not present

## 2021-03-28 DIAGNOSIS — R2689 Other abnormalities of gait and mobility: Secondary | ICD-10-CM | POA: Diagnosis not present

## 2021-03-28 DIAGNOSIS — Z87891 Personal history of nicotine dependence: Secondary | ICD-10-CM | POA: Diagnosis not present

## 2021-03-28 DIAGNOSIS — R531 Weakness: Secondary | ICD-10-CM | POA: Diagnosis not present

## 2021-03-28 DIAGNOSIS — I517 Cardiomegaly: Secondary | ICD-10-CM | POA: Diagnosis not present

## 2021-03-28 DIAGNOSIS — N390 Urinary tract infection, site not specified: Secondary | ICD-10-CM | POA: Diagnosis not present

## 2021-03-28 DIAGNOSIS — R000 Tachycardia, unspecified: Secondary | ICD-10-CM | POA: Diagnosis not present

## 2021-03-28 DIAGNOSIS — R0689 Other abnormalities of breathing: Secondary | ICD-10-CM | POA: Diagnosis not present

## 2021-03-28 LAB — COMPREHENSIVE METABOLIC PANEL
ALT: 23 U/L (ref 0–44)
AST: 18 U/L (ref 15–41)
Albumin: 4 g/dL (ref 3.5–5.0)
Alkaline Phosphatase: 102 U/L (ref 38–126)
Anion gap: 11 (ref 5–15)
BUN: 17 mg/dL (ref 8–23)
CO2: 22 mmol/L (ref 22–32)
Calcium: 9.4 mg/dL (ref 8.9–10.3)
Chloride: 107 mmol/L (ref 98–111)
Creatinine, Ser: 0.78 mg/dL (ref 0.44–1.00)
GFR, Estimated: >60 mL/min (ref >60)
Glucose, Bld: 114 mg/dL — ABNORMAL HIGH (ref 70–99)
Potassium: 3.7 mmol/L (ref 3.5–5.1)
Sodium: 140 mmol/L (ref 135–145)
Total Bilirubin: 0.5 mg/dL (ref 0.3–1.2)
Total Protein: 8.3 g/dL — ABNORMAL HIGH (ref 6.5–8.1)

## 2021-03-28 LAB — CBC WITH DIFFERENTIAL/PLATELET
Abs Immature Granulocytes: 0.12 10*3/uL — ABNORMAL HIGH (ref 0.00–0.07)
Basophils Absolute: 0.1 10*3/uL (ref 0.0–0.1)
Basophils Relative: 1 %
Eosinophils Absolute: 0 10*3/uL (ref 0.0–0.5)
Eosinophils Relative: 0 %
HCT: 40.6 % (ref 36.0–46.0)
Hemoglobin: 13.3 g/dL (ref 12.0–15.0)
Immature Granulocytes: 1 %
Lymphocytes Relative: 2 %
Lymphs Abs: 0.3 10*3/uL — ABNORMAL LOW (ref 0.7–4.0)
MCH: 31.7 pg (ref 26.0–34.0)
MCHC: 32.8 g/dL (ref 30.0–36.0)
MCV: 96.9 fL (ref 80.0–100.0)
Monocytes Absolute: 1.3 10*3/uL — ABNORMAL HIGH (ref 0.1–1.0)
Monocytes Relative: 9 %
Neutro Abs: 13.9 10*3/uL — ABNORMAL HIGH (ref 1.7–7.7)
Neutrophils Relative %: 87 %
Platelets: 344 10*3/uL (ref 150–400)
RBC: 4.19 MIL/uL (ref 3.87–5.11)
RDW: 14.7 % (ref 11.5–15.5)
WBC: 15.8 10*3/uL — ABNORMAL HIGH (ref 4.0–10.5)
nRBC: 0 % (ref 0.0–0.2)

## 2021-03-28 LAB — URINALYSIS, ROUTINE W REFLEX MICROSCOPIC
Bilirubin Urine: NEGATIVE
Glucose, UA: NEGATIVE mg/dL
Ketones, ur: 5 mg/dL — AB
Nitrite: POSITIVE — AB
Protein, ur: 30 mg/dL — AB
Specific Gravity, Urine: 1.015 (ref 1.005–1.030)
WBC, UA: >50 WBC/hpf — ABNORMAL HIGH (ref 0–5)
pH: 5 (ref 5.0–8.0)

## 2021-03-28 LAB — PROTIME-INR
INR: 1 (ref 0.8–1.2)
Prothrombin Time: 13.3 seconds (ref 11.4–15.2)

## 2021-03-28 LAB — SARS CORONAVIRUS 2 (TAT 6-24 HRS): SARS Coronavirus 2: NEGATIVE

## 2021-03-28 LAB — MRSA NEXT GEN BY PCR, NASAL: MRSA by PCR Next Gen: NOT DETECTED

## 2021-03-28 LAB — LACTIC ACID, PLASMA
Lactic Acid, Venous: 1 mmol/L (ref 0.5–1.9)
Lactic Acid, Venous: 1.2 mmol/L (ref 0.5–1.9)

## 2021-03-28 IMAGING — CR DG CHEST 2V
2 series · 2 of 2 positions shown · non-contrast
Comparison: [DATE]

CLINICAL DATA: Suspected sepsis, fever

EXAM:
CHEST - 2 VIEW

[w chest lat]
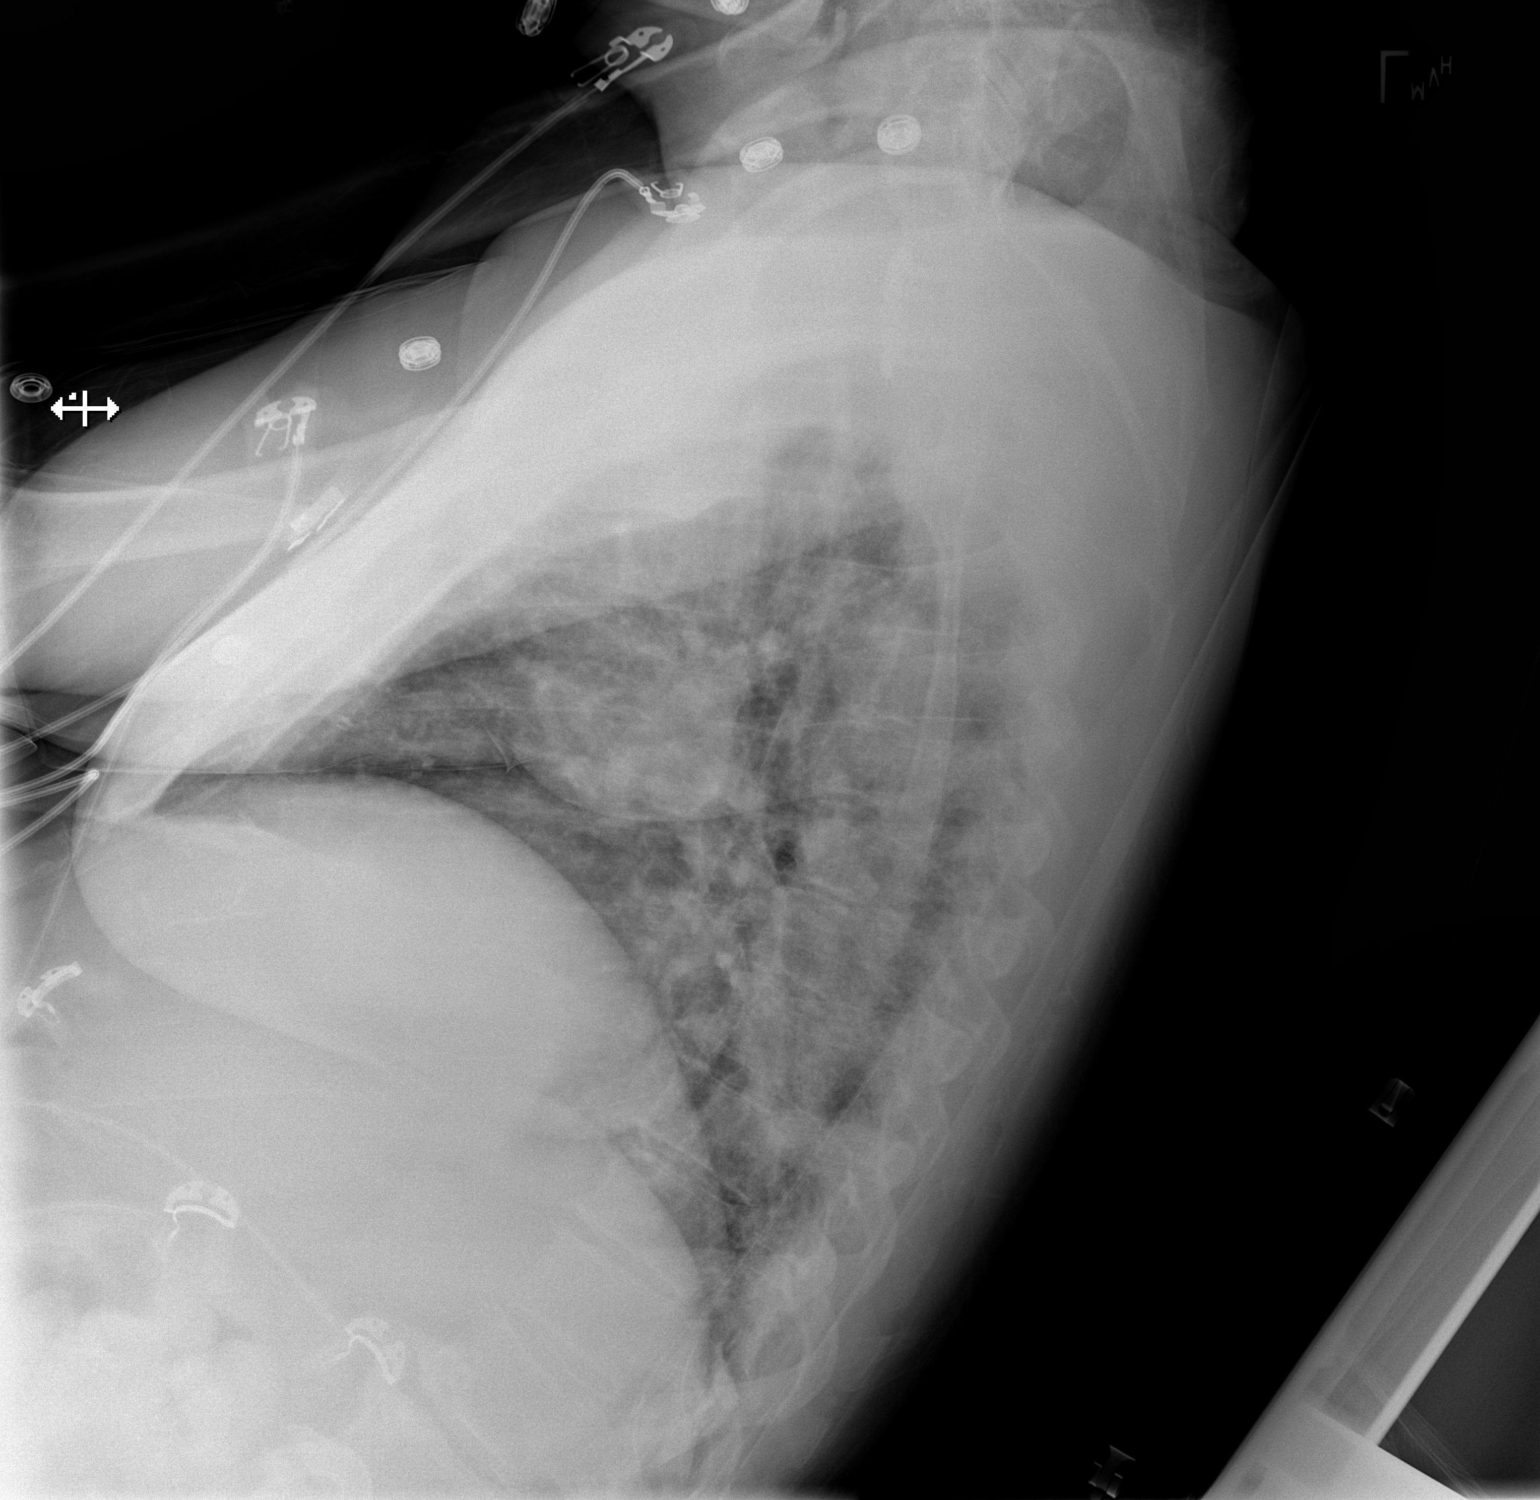

[x chest ap]
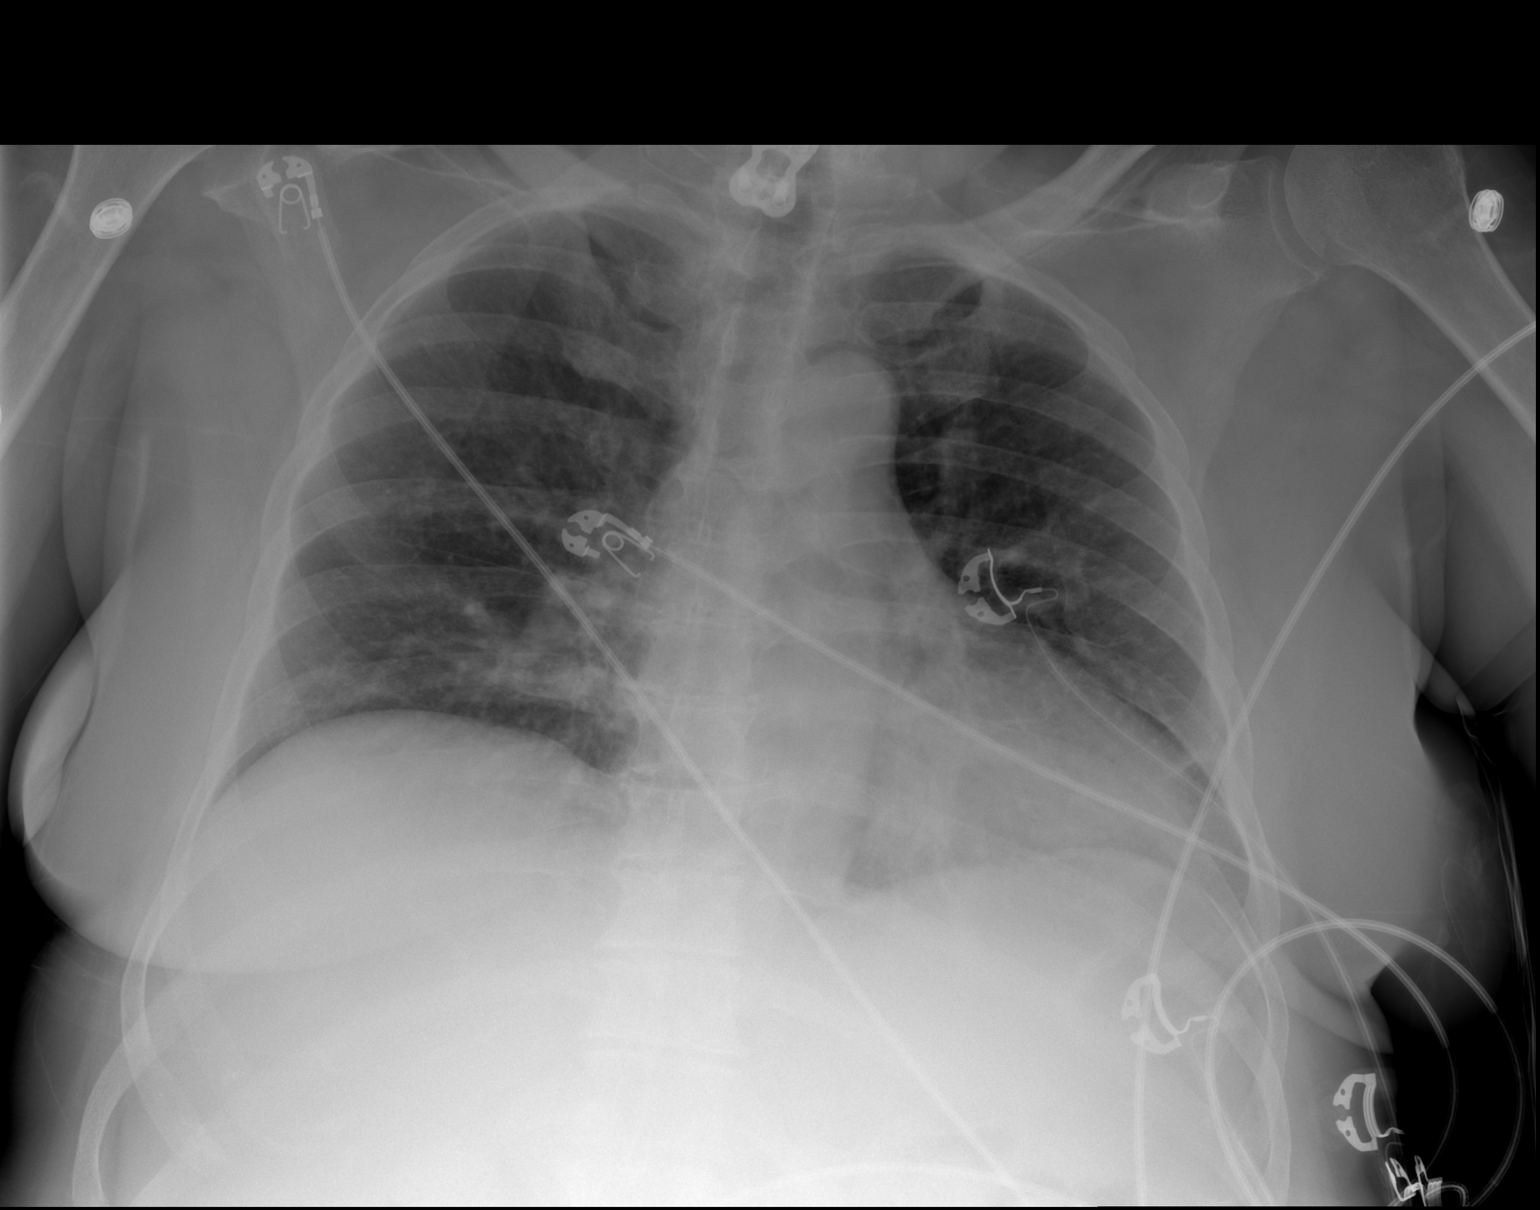

[2 of 2 positions shown; findings below may reference images not displayed]

FINDINGS: Cardiomegaly. Both lungs are clear. Disc degenerative disease of the
thoracic spine.
IMPRESSION: Cardiomegaly without acute abnormality of the lungs.

## 2021-03-28 MED ORDER — ONDANSETRON HCL 4 MG PO TABS: 4.0000 mg | ORAL_TABLET | Freq: Four times a day (QID) | ORAL | Status: DC | PRN

## 2021-03-28 MED ORDER — LORAZEPAM 2 MG/ML IJ SOLN
0.5000 mg | Freq: Once | INTRAMUSCULAR | Status: AC
  Administered 2021-03-28: 0.5 mg via INTRAVENOUS
  Filled 2021-03-28: qty 1

## 2021-03-28 MED ORDER — AMLODIPINE BESYLATE 5 MG PO TABS
5.0000 mg | ORAL_TABLET | Freq: Every day | ORAL | Status: DC
Start: 2021-03-28 — End: 2021-03-31
  Administered 2021-03-31: 5 mg via ORAL
  Filled 2021-03-28: qty 1

## 2021-03-28 MED ORDER — ACETAMINOPHEN 650 MG RE SUPP
650.0000 mg | Freq: Four times a day (QID) | RECTAL | Status: DC | PRN
  Administered 2021-03-28 – 2021-03-29 (×3): 650 mg via RECTAL
  Filled 2021-03-28 (×3): qty 1

## 2021-03-28 MED ORDER — LABETALOL HCL 5 MG/ML IV SOLN
10.0000 mg | Freq: Four times a day (QID) | INTRAVENOUS | Status: DC
  Administered 2021-03-28 – 2021-03-29 (×2): 10 mg via INTRAVENOUS
  Filled 2021-03-28 (×2): qty 4

## 2021-03-28 MED ORDER — SODIUM CHLORIDE 0.9% FLUSH
3.0000 mL | Freq: Two times a day (BID) | INTRAVENOUS | Status: DC
  Administered 2021-03-28 – 2021-04-04 (×12): 3 mL via INTRAVENOUS

## 2021-03-28 MED ORDER — VANCOMYCIN HCL IN DEXTROSE 1-5 GM/200ML-% IV SOLN
1000.0000 mg | Freq: Once | INTRAVENOUS | Status: AC
  Administered 2021-03-28: 1000 mg via INTRAVENOUS
  Filled 2021-03-28: qty 200

## 2021-03-28 MED ORDER — SODIUM CHLORIDE 0.9 % IV SOLN: 2.0000 g | Freq: Once | INTRAVENOUS | Status: DC

## 2021-03-28 MED ORDER — METRONIDAZOLE 500 MG/100ML IV SOLN
500.0000 mg | Freq: Once | INTRAVENOUS | Status: AC
  Administered 2021-03-28: 500 mg via INTRAVENOUS
  Filled 2021-03-28: qty 100

## 2021-03-28 MED ORDER — LACTATED RINGERS IV BOLUS (SEPSIS)
1000.0000 mL | Freq: Once | INTRAVENOUS | Status: AC
  Administered 2021-03-28: 1000 mL via INTRAVENOUS

## 2021-03-28 MED ORDER — CHLORHEXIDINE GLUCONATE CLOTH 2 % EX PADS
6.0000 | MEDICATED_PAD | Freq: Every day | CUTANEOUS | Status: DC
  Administered 2021-03-28 – 2021-04-03 (×7): 6 via TOPICAL

## 2021-03-28 MED ORDER — POLYETHYLENE GLYCOL 3350 17 G PO PACK
17.0000 g | PACK | Freq: Every day | ORAL | Status: DC | PRN
  Administered 2021-03-31: 17 g via ORAL
  Filled 2021-03-28: qty 1

## 2021-03-28 MED ORDER — LABETALOL HCL 5 MG/ML IV SOLN
5.0000 mg | Freq: Four times a day (QID) | INTRAVENOUS | Status: DC
  Administered 2021-03-28: 5 mg via INTRAVENOUS
  Filled 2021-03-28: qty 4

## 2021-03-28 MED ORDER — ONDANSETRON HCL 4 MG/2ML IJ SOLN
4.0000 mg | Freq: Four times a day (QID) | INTRAMUSCULAR | Status: DC | PRN
  Administered 2021-03-29 – 2021-03-30 (×3): 4 mg via INTRAVENOUS
  Filled 2021-03-28 (×3): qty 2

## 2021-03-28 MED ORDER — ENOXAPARIN SODIUM 40 MG/0.4ML IJ SOSY
40.0000 mg | PREFILLED_SYRINGE | INTRAMUSCULAR | Status: DC
  Administered 2021-03-28 – 2021-04-04 (×8): 40 mg via SUBCUTANEOUS
  Filled 2021-03-28 (×8): qty 0.4

## 2021-03-28 MED ORDER — SODIUM CHLORIDE 0.9 % IV SOLN
2.0000 g | Freq: Once | INTRAVENOUS | Status: AC
  Administered 2021-03-28: 2 g via INTRAVENOUS
  Filled 2021-03-28: qty 2

## 2021-03-28 MED ORDER — ACETAMINOPHEN 650 MG RE SUPP
650.0000 mg | Freq: Once | RECTAL | Status: AC
  Administered 2021-03-28: 650 mg via RECTAL
  Filled 2021-03-28: qty 1

## 2021-03-28 MED ORDER — LACTATED RINGERS IV BOLUS (SEPSIS)
1500.0000 mL | Freq: Once | INTRAVENOUS | Status: AC
  Administered 2021-03-28: 1500 mL via INTRAVENOUS

## 2021-03-28 MED ORDER — SODIUM CHLORIDE 0.9 % IV SOLN
INTRAVENOUS | Status: DC | PRN
  Administered 2021-03-28 – 2021-03-30 (×5): 250 mL via INTRAVENOUS

## 2021-03-28 MED ORDER — SODIUM CHLORIDE 0.9 % IV SOLN
2.0000 g | Freq: Three times a day (TID) | INTRAVENOUS | Status: DC
  Administered 2021-03-28 – 2021-04-01 (×11): 2 g via INTRAVENOUS
  Filled 2021-03-28 (×11): qty 2

## 2021-03-28 MED ORDER — MORPHINE SULFATE (PF) 2 MG/ML IV SOLN
2.0000 mg | Freq: Once | INTRAVENOUS | Status: AC
  Administered 2021-03-29: 2 mg via INTRAVENOUS
  Filled 2021-03-28: qty 1

## 2021-03-28 NOTE — Progress Notes (Signed)
A consult was received from an ED physician for vancomycin and aztreonam per pharmacy dosing (for an indication other than meningitis). The patient's profile has been reviewed for ht/wt/allergies/indication/available labs, and aztreonam was changed to cefepime based on prior tolerance of lower generation cephalosporins. A one time order has been placed for vancomycin and cefepime.  Further antibiotics/pharmacy consults should be ordered by admitting physician if indicated.                       Bernadene Person, PharmD, BCPS [phone] 03/28/2021, 2:26 PM

## 2021-03-28 NOTE — H&P (Addendum)
History and Physical    Stephanie Carrillo NID:[id number] DOB: Jun 29, 1956 DOA: 03/28/2021  I have briefly reviewed the patient's prior medical records in [facility] Link  PCP: Lavada Mesi, MD  Patient coming from: home  Chief Complaint: AMS  HPI: Stephanie Carrillo is a 65 y.o. female with medical history significant of chronic pain, polypharmacy, and HTN comes in with AMS.  This presentation is similar per nursing to her presentation 2 weeks ago when she was hospitalized with a UTI.   Per chart review patient lives home alone but is frequently checked on by her friend.  Today, her friend found her confused and febrile.   In the ER, she was given, IVF, broad spectrum IV abx.    Per discussion with nursing, she was actually more altered last admission and required Geodon.   Her urine culture grew e coli last admission, resistant to bactrim and cipro.  PDMP shows new short term tramadol prescription on 8/19.      Review of Systems: unable to do due to AMS   Past Medical History:  Diagnosis Date   Anxiety    Depression    Hypertension    Insomnia    Meningitis    Recurrent UTI    Seizures (HCC)    Substance abuse [facility])     Past Surgical History:  Procedure Laterality Date   BACK SURGERY     CERVICAL DISC SURGERY     FRACTURE SURGERY N/A    Phreesia 05/07/2020   SPINE SURGERY N/A    Phreesia 05/07/2020     reports that she has quit smoking. She has never used smokeless tobacco. She reports that she does not currently use alcohol. She reports that she does not currently use drugs.  Allergies  Allergen Reactions   Ciprofloxacin Shortness Of Breath and Other (See Comments)    Respiratory arrest   Other Anaphylaxis   Penicillins Shortness Of Breath and Rash    Did it involve swelling of the face/tongue/throat, SOB, or low BP? y Did it involve sudden or severe rash/hives, skin peeling, or any reaction on the inside of your mouth or nose? y Did you need to seek medical attention  at a hospital or doctor's office? n When did it last happen?  1985 If all above answers are "NO", may proceed with cephalosporin use.  Tolerated Ceftriaxone 10/05/20 - 10/07/20   Oxycodone Other (See Comments)    Patient in recovery process.    Family History  Problem Relation Age of Onset   Multiple sclerosis Mother    Cancer Mother        Possibly breast or colon mets to brain   Cancer Father    Lung cancer Father    Heart disease Father    Cancer Brother    Testicular cancer Brother    Prostate cancer Brother    Skin cancer Brother    Colon polyps Brother    Colon polyps Brother     Prior to Admission medications   Medication Sig Start Date End Date Taking? Authorizing Provider  acetaminophen (TYLENOL) 500 MG tablet Take 1,000 mg by mouth every 4 (four) hours.   Yes [provider]  amLODipine (NORVASC) 5 MG tablet Take 1 tablet (5 mg total) by mouth daily. 03/21/21  Yes Meredeth Ide, MD  baclofen (LIORESAL) 10 MG tablet Take 10 mg by mouth 3 (three) times daily as needed for muscle spasms.   Yes [provider]  buprenorphine (SUBUTEX) 2 MG SUBL  SL tablet Place 2 mg under the tongue every 6 (six) hours. 01/05/20  Yes [provider]  clonazePAM (KLONOPIN) 0.5 MG tablet Take 1 tablet (0.5 mg total) by mouth 3 (three) times daily as needed for anxiety. 03/26/21  Yes Hilts, Casimiro Needle, MD  DULoxetine (CYMBALTA) 60 MG capsule Take 1 capsule (60 mg total) by mouth daily. 03/26/21  Yes Hilts, Casimiro Needle, MD  gabapentin (NEURONTIN) 800 MG tablet Take 800 mg by mouth 3 (three) times daily.   Yes [provider]  pantoprazole (PROTONIX) 40 MG tablet Take 1 tablet (40 mg total) by mouth daily at 6 (six) AM. 10/08/20  Yes Rodolph Bong, MD  tiZANidine (ZANAFLEX) 4 MG tablet Take 1 tablet (4 mg total) by mouth every 8 (eight) hours as needed for muscle spasms. 01/31/21  Yes Hilts, Casimiro Needle, MD  sulfamethoxazole-trimethoprim (BACTRIM DS) 800-160 MG tablet Take 1  tablet by mouth 2 (two) times daily. 03/26/21   Hilts, Casimiro Needle, MD    Physical Exam: Vitals:   03/28/21 1303 03/28/21 1334 03/28/21 1406 03/28/21 1434  BP:  (!) 197/93 (!) 197/113 (!) 189/91  Pulse:  (!) 129 (!) 132 (!) 132  Resp:  (!) 23 18 20   Temp:      TempSrc:      SpO2:  94% 96% 97%  Weight: 86.8 kg     Height: 5\' 7"  (1.702 m)         Constitutional: NAD, moving legs spontaneously Neck: normal, supple, no masses, no thyromegaly, no nucal rigidity Respiratory: clear to auscultation bilaterally, no wheezing, no crackles. Normal respiratory effort. No accessory muscle use.  Cardiovascular: Regular rate and rhythm, no murmurs / rubs / gallops. No extremity edema. 2+ pedal pulses.  Abdomen: no tenderness, no masses palpated. Bowel sounds positive.  Musculoskeletal: no clubbing / cyanosis. Normal muscle tone.  +clonus  Skin: few scabs on legs, largest on right leg Neurologic: following commands intermittently.    Labs on Admission: I have personally reviewed following labs and imaging studies  CBC: Recent Labs  Lab 03/28/21 1255  WBC 15.8*  NEUTROABS PENDING  HGB 13.3  HCT 40.6  MCV 96.9  PLT 344   Basic Metabolic Panel: Recent Labs  Lab 03/28/21 1255  NA 140  K 3.7  CL 107  CO2 22  GLUCOSE 114*  BUN 17  CREATININE 0.78  CALCIUM 9.4   GFR: Estimated Creatinine Clearance: 79.4 mL/min (by C-G formula based on SCr of 0.78 mg/dL). Liver Function Tests: Recent Labs  Lab 03/28/21 1255  AST 18  ALT 23  ALKPHOS 102  BILITOT 0.5  PROT 8.3*  ALBUMIN 4.0   No results for input(s): LIPASE, AMYLASE in the last 168 hours. No results for input(s): AMMONIA in the last 168 hours. Coagulation Profile: Recent Labs  Lab 03/28/21 1255  INR 1.0   Cardiac Enzymes: No results for input(s): CKTOTAL, CKMB, CKMBINDEX, TROPONINI in the last 168 hours. BNP (last 3 results) No results for input(s): PROBNP in the last 8760 hours. HbA1C: No results for input(s): HGBA1C  in the last 72 hours. CBG: No results for input(s): GLUCAP in the last 168 hours. Lipid Profile: No results for input(s): CHOL, HDL, LDLCALC, TRIG, CHOLHDL, LDLDIRECT in the last 72 hours. Thyroid Function Tests: No results for input(s): TSH, T4TOTAL, FREET4, T3FREE, THYROIDAB in the last 72 hours. Anemia Panel: No results for input(s): VITAMINB12, FOLATE, FERRITIN, TIBC, IRON, RETICCTPCT in the last 72 hours. Urine analysis:    Component Value Date/Time   COLORURINE  AMBER (A) 03/19/2021 1200   APPEARANCEUR CLOUDY (A) 03/19/2021 1200   LABSPEC 1.028 03/19/2021 1200   PHURINE 6.0 03/19/2021 1200   GLUCOSEU NEGATIVE 03/19/2021 1200   HGBUR LARGE (A) 03/19/2021 1200   BILIRUBINUR NEGATIVE 03/19/2021 1200   KETONESUR NEGATIVE 03/19/2021 1200   PROTEINUR >=300 (A) 03/19/2021 1200   NITRITE NEGATIVE 03/19/2021 1200   LEUKOCYTESUR SMALL (A) 03/19/2021 1200     Radiological Exams on Admission: DG Chest 2 View  Result Date: 03/28/2021 CLINICAL DATA:  Suspected sepsis, fever EXAM: CHEST - 2 VIEW COMPARISON:  03/18/2021 FINDINGS: Cardiomegaly. Both lungs are clear. Disc degenerative disease of the thoracic spine. IMPRESSION: Cardiomegaly without acute abnormality of the lungs. Electronically Signed   By: Lauralyn Primes M.D.   On: 03/28/2021 14:58      Assessment/Plan Active Problems:   Anxiety and depression   Chronic pain syndrome   Acute encephalopathy   Hypertensive urgency   Sepsis (HCC)    Sepsis due to suspected UTI -urine pending -IV abx (broad given in ER but narrowed to cephalosporin going forward) -doubt serotonin syndrome -flu/COVID pending -xray chest negative -once able will need PVR and referral to urology due to frequent UTIs   HTN urgency -IV bb until able to take PO  Acute metabolic encephalopathy -monitor closely -similar presentation in past that improved with IV abx -? Polypharmacy and medicine non-compliance mentioned in past d/c summaries -consider  EEG if continues to be altered  Chronic pain -once able to take PO and closer to baseline, will need to resume home meds  Anxiety/depression -resume meds as able once infection treated -IV ativan x 1 and then PRN   DVT prophylaxis: lovenox Code Status: DNR (unable to verify with patient but consistent with per prior hospitalizations) Spoke with her MPOA (friend who lives locally and she will update patient's brother) Consults called: none   Admission status: inpt  At the time of admission, it appears that the appropriate admission status for this patient is INPATIENT. This is judged to be reasonable and necessary in order to provide the required high service intensity to ensure the patient's safety given the presenting symptoms, physical exam findings, and initial radiographic and laboratory data in the context of their chronic comorbidities. Current circumstances are sepsis, and it is felt to place patient at high risk for further clinical deterioration threatening life, limb, or organ. Moreover, it is my clinical judgment that the patient will require inpatient hospital care spanning beyond 2 midnights from the point of admission and that early discharge would result in unnecessary risk of decompensation and readmission or threat to life, limb or bodily function.   Joseph Art [facility]   How to contact the [facility] Attending or Consulting provider 7A - 7P or covering provider during after hours 7P -7A, for this patient?  Check the care team in [facility] and look for a) attending/consulting [facility] provider listed and b) the [facility] team listed Log into [email or web address] and use [place]'s universal password to access. If you do not have the password, please contact the hospital operator. Locate the [facility] provider you are looking for under [facility] and page to a number that you can be directly reached. If you still have difficulty reaching the provider, please page the  Hospital (Director on  Call) for the Hospitalists listed on amion for assistance.  03/28/2021, 4:18 PM

## 2021-03-28 NOTE — ED Provider Notes (Addendum)
[facility]-EMERGENCY DEPT Provider Note   CSN: [id number] Arrival date & time: 03/28/21  1230     History Chief Complaint  Patient presents with   Code Sepsis    Stephanie Carrillo is a 65 y.o. female.  HPI  65 year old female past medical history of HTN, recurrent UTI, seizure, polysubstance abuse presents to the emergency department with altered mental status, fever.  Patient lives home alone.  She gets checked on frequently by her friend who found her altered, febrile.  Patient is altered and does not contribute to history at this time.  Past Medical History:  Diagnosis Date   Anxiety    Depression    Hypertension    Insomnia    Meningitis    Recurrent UTI    Seizures (HCC)    Substance abuse [facility])     Patient Active Problem List   Diagnosis Date Noted   Medication withdrawal (HCC) 03/18/2021   Hypertensive urgency 03/18/2021   Accelerated hypertension 03/17/2021   Acute encephalopathy 03/17/2021   AKI (acute kidney injury) (HCC)    Fall    UTI (urinary tract infection) 10/04/2020   Nausea vomiting and diarrhea    Acute metabolic encephalopathy 02/23/2020   Benzodiazepine withdrawal (HCC) 02/01/2020   Hypokalemia    Tachycardia    Altered mental state 01/27/2020   Anxiety and depression 09/23/2018   Insomnia 09/23/2018   Chronic pain syndrome 09/23/2018    Past Surgical History:  Procedure Laterality Date   BACK SURGERY     CERVICAL DISC SURGERY     FRACTURE SURGERY N/A    Phreesia 05/07/2020   SPINE SURGERY N/A    Phreesia 05/07/2020     OB History   No obstetric history on file.     Family History  Problem Relation Age of Onset   Multiple sclerosis Mother    Cancer Mother        Possibly breast or colon mets to brain   Cancer Father    Lung cancer Father    Heart disease Father    Cancer Brother    Testicular cancer Brother    Prostate cancer Brother    Skin cancer Brother    Colon polyps Brother    Colon polyps  Brother     Social History   Tobacco Use   Smoking status: Former   Smokeless tobacco: Never  Building services engineer Use: Never used  Substance Use Topics   Alcohol use: Not Currently   Drug use: Not Currently    Comment: opiates -clean 6 months    Home Medications Prior to Admission medications   Medication Sig Start Date End Date Taking? Authorizing Provider  acetaminophen (TYLENOL) 500 MG tablet Take 1,000 mg by mouth every 4 (four) hours.   Yes [provider]  amLODipine (NORVASC) 5 MG tablet Take 1 tablet (5 mg total) by mouth daily. 03/21/21  Yes Meredeth Ide, MD  baclofen (LIORESAL) 10 MG tablet Take 10 mg by mouth 3 (three) times daily as needed for muscle spasms.   Yes [provider]  buprenorphine (SUBUTEX) 2 MG SUBL SL tablet Place 2 mg under the tongue every 6 (six) hours. 01/05/20  Yes [provider]  clonazePAM (KLONOPIN) 0.5 MG tablet Take 1 tablet (0.5 mg total) by mouth 3 (three) times daily as needed for anxiety. 03/26/21  Yes Hilts, Casimiro Needle, MD  DULoxetine (CYMBALTA) 60 MG capsule Take 1 capsule (60 mg total) by mouth daily. 03/26/21  Yes  Hilts, Michael, MD  gabapentin (NEURONTIN) 800 MG tablet Take 800 mg by mouth 3 (three) times daily.   Yes [provider]  pantoprazole (PROTONIX) 40 MG tablet Take 1 tablet (40 mg total) by mouth daily at 6 (six) AM. 10/08/20  Yes Rodolph Bong, MD  tiZANidine (ZANAFLEX) 4 MG tablet Take 1 tablet (4 mg total) by mouth every 8 (eight) hours as needed for muscle spasms. 01/31/21  Yes Hilts, Casimiro Needle, MD  sulfamethoxazole-trimethoprim (BACTRIM DS) 800-160 MG tablet Take 1 tablet by mouth 2 (two) times daily. 03/26/21   Hilts, Casimiro Needle, MD    Allergies    Ciprofloxacin, Other, Penicillins, and Oxycodone  Review of Systems   Review of Systems  Unable to perform ROS: Mental status change   Physical Exam Updated Vital Signs BP (!) 189/91   Pulse (!) 132   Temp (!) 102.9 F (39.4 C) (Rectal)    Resp 20   Ht 5\' 7"  (1.702 m)   Wt 86.8 kg   SpO2 97%   BMI 29.97 kg/m   Physical Exam Vitals and nursing note reviewed.  Constitutional:      Comments: Opens eyes to name, moaning, altered  HENT:     Head: Normocephalic.     Mouth/Throat:     Mouth: Mucous membranes are moist.  Eyes:     Pupils: Pupils are equal, round, and reactive to light.  Cardiovascular:     Rate and Rhythm: Tachycardia present.  Pulmonary:     Effort: Pulmonary effort is normal. No respiratory distress.     Comments: Protecting airway Abdominal:     General: There is no distension.     Palpations: Abdomen is soft.  Musculoskeletal:        General: No deformity.     Cervical back: No rigidity.  Skin:    General: Skin is warm.  Neurological:     Comments: Altered and not following commands  Psychiatric:        Mood and Affect: Mood normal.    ED Results / Procedures / Treatments   Labs (all labs ordered are listed, but only abnormal results are displayed) Labs Reviewed  COMPREHENSIVE METABOLIC PANEL - Abnormal; Notable for the following components:      Result Value   Glucose, Bld 114 (*)    Total Protein 8.3 (*)    All other components within normal limits  CBC WITH DIFFERENTIAL/PLATELET - Abnormal; Notable for the following components:   WBC 15.8 (*)    All other components within normal limits  CULTURE, BLOOD (ROUTINE X 2)  CULTURE, BLOOD (ROUTINE X 2)  SARS CORONAVIRUS 2 (TAT 6-24 HRS)  LACTIC ACID, PLASMA  PROTIME-INR  LACTIC ACID, PLASMA  URINALYSIS, ROUTINE W REFLEX MICROSCOPIC    EKG EKG Interpretation  Date/Time:  Thursday March 28 2021 13:21:59 EDT Ventricular Rate:  132 PR Interval:  128 QRS Duration: 68 QT Interval:  372 QTC Calculation: 551 R Axis:   50 Text Interpretation: Critical Test Result: Long QTc Sinus tachycardia Low voltage QRS Nonspecific ST abnormality Abnormal ECG Sinus tachycardia, disagree with QTc measure Confirmed by 07-05-1991 [phone]) on  03/28/2021 1:27:33 PM  Radiology DG Chest 2 View  Result Date: 03/28/2021 CLINICAL DATA:  Suspected sepsis, fever EXAM: CHEST - 2 VIEW COMPARISON:  03/18/2021 FINDINGS: Cardiomegaly. Both lungs are clear. Disc degenerative disease of the thoracic spine. IMPRESSION: Cardiomegaly without acute abnormality of the lungs. Electronically Signed   By: 03/20/2021 M.D.   On: 03/28/2021 14:58  Procedures .Critical Care  Date/Time: 03/28/2021 3:08 PM Performed by: Rozelle Logan, DO Authorized by: Rozelle Logan, DO   Critical care provider statement:    Critical care time (minutes):  60   Critical care time was exclusive of:  Separately billable procedures and treating other patients   Critical care was necessary to treat or prevent imminent or life-threatening deterioration of the following conditions:  Sepsis and shock   Critical care was time spent personally by me on the following activities:  Discussions with consultants, evaluation of patient's response to treatment, examination of patient, ordering and performing treatments and interventions, ordering and review of laboratory studies, ordering and review of radiographic studies, pulse oximetry, re-evaluation of patient's condition, obtaining history from patient or surrogate and review of old charts   I assumed direction of critical care for this patient from another provider in my specialty: no     Care discussed with: admitting provider     Medications Ordered in ED Medications  lactated ringers bolus 1,500 mL (1,500 mLs Intravenous New Bag/Given 03/28/21 1441)  metroNIDAZOLE (FLAGYL) IVPB 500 mg (has no administration in time range)  vancomycin (VANCOCIN) IVPB 1000 mg/200 mL premix (has no administration in time range)  ceFEPIme (MAXIPIME) 2 g in sodium chloride 0.9 % 100 mL IVPB (2 g Intravenous New Bag/Given 03/28/21 1442)  lactated ringers bolus 1,000 mL (1,000 mLs Intravenous New Bag/Given 03/28/21 1412)  acetaminophen (TYLENOL)  suppository 650 mg (650 mg Rectal Given 03/28/21 1441)    ED Course  I have reviewed the triage vital signs and the nursing notes.  Pertinent labs & imaging results that were available during my care of the patient were reviewed by me and considered in my medical decision making (see chart for details).    MDM Rules/Calculators/A&P                           65 year old female presents emergency department concern for altered mental status.  She is febrile and tachycardic on arrival.  Evaluated per sepsis protocol, IV fluids started.  Of note patient had similar presentation a couple weeks ago where she was admitted with encephalopathy, UTI, rebound hypertension and tachycardia due to medication/beta-blocker withdrawal.  No neck rigidity on exam, altered similar to how she presented a couple weeks ago.  Today patient has a leukocytosis of 15, we are pending urinalysis.  She continues to be tachycardic however improved with IV hydration.  She was given rectal Tylenol.  Chest x-ray shows no acute finding, she is pending flu and COVID.  Will require admission.  Is currently being treated with antibiotics broad-spectrum for sepsis protocol.  Lactic is normal.  Patients evaluation and results requires admission for further treatment and care.   Final Clinical Impression(s) / ED Diagnoses Final diagnoses:  None    Rx / DC Orders ED Discharge Orders     None        Rozelle Logan, DO 03/28/21 1509    Rozelle Logan, DO 03/28/21 1543

## 2021-03-28 NOTE — Plan of Care (Signed)

## 2021-03-28 NOTE — ED Triage Notes (Signed)
Pt presents to ED via ems cc code sepsis. Pt altered, has fever. Per ems, pt has hx of recurrent UTI's.

## 2021-03-29 ENCOUNTER — Inpatient Hospital Stay (HOSPITAL_COMMUNITY): Payer: Medicare Other

## 2021-03-29 ENCOUNTER — Inpatient Hospital Stay: Payer: Self-pay

## 2021-03-29 LAB — BLOOD CULTURE ID PANEL (REFLEXED) - BCID2

## 2021-03-29 LAB — COMPREHENSIVE METABOLIC PANEL
ALT: 23 U/L (ref 0–44)
AST: 23 U/L (ref 15–41)
Albumin: 3.7 g/dL (ref 3.5–5.0)
Alkaline Phosphatase: 112 U/L (ref 38–126)
Anion gap: 13 (ref 5–15)
BUN: 11 mg/dL (ref 8–23)
CO2: 26 mmol/L (ref 22–32)
Calcium: 9.2 mg/dL (ref 8.9–10.3)
Chloride: 98 mmol/L (ref 98–111)
Creatinine, Ser: 0.62 mg/dL (ref 0.44–1.00)
GFR, Estimated: >60 mL/min (ref >60)
Glucose, Bld: 124 mg/dL — ABNORMAL HIGH (ref 70–99)
Potassium: 2.7 mmol/L — CL (ref 3.5–5.1)
Sodium: 137 mmol/L (ref 135–145)
Total Bilirubin: 0.8 mg/dL (ref 0.3–1.2)
Total Protein: 8.3 g/dL — ABNORMAL HIGH (ref 6.5–8.1)

## 2021-03-29 LAB — CBC
HCT: 41.9 % (ref 36.0–46.0)
Hemoglobin: 13.8 g/dL (ref 12.0–15.0)
MCH: 31.9 pg (ref 26.0–34.0)
MCHC: 32.9 g/dL (ref 30.0–36.0)
MCV: 97 fL (ref 80.0–100.0)
Platelets: 342 10*3/uL (ref 150–400)
RBC: 4.32 MIL/uL (ref 3.87–5.11)
RDW: 14.5 % (ref 11.5–15.5)
WBC: 18.4 10*3/uL — ABNORMAL HIGH (ref 4.0–10.5)
nRBC: 0 % (ref 0.0–0.2)

## 2021-03-29 LAB — HEMOGLOBIN AND HEMATOCRIT, BLOOD
HCT: 38.6 % (ref 36.0–46.0)
Hemoglobin: 12.8 g/dL (ref 12.0–15.0)

## 2021-03-29 LAB — MAGNESIUM: Magnesium: 1.6 mg/dL — ABNORMAL LOW (ref 1.7–2.4)

## 2021-03-29 LAB — OCCULT BLOOD X 1 CARD TO LAB, STOOL: Fecal Occult Bld: NEGATIVE

## 2021-03-29 IMAGING — CT CT HEAD W/O CM
4 series · 16 of 47 positions shown, 18 images · non-contrast
Comparison: Head CT and MRI [DATE]

CLINICAL DATA: Mental status change, unknown cause. Fever, sepsis,
and confusion.

EXAM:
CT HEAD WITHOUT CONTRAST
TECHNIQUE: Contiguous axial images were obtained from the base of the skull
through the vertex without intravenous contrast.

[Series 2: head wo · axial · 0.41mm/px · z∈[+1305,+1420]mm · 7 of 31 slices shown, 9 images]
[im 4/31  brain]
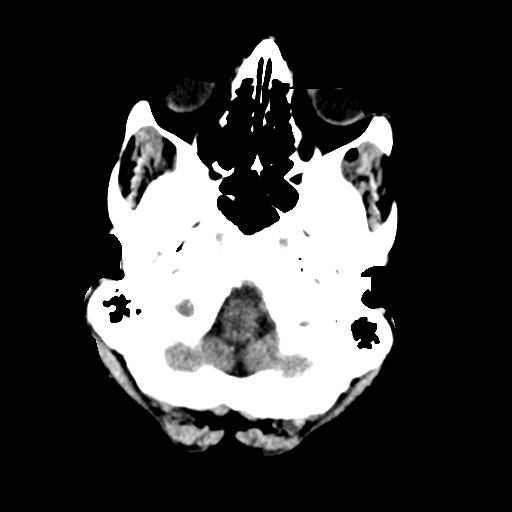
[im 4/31  bone]
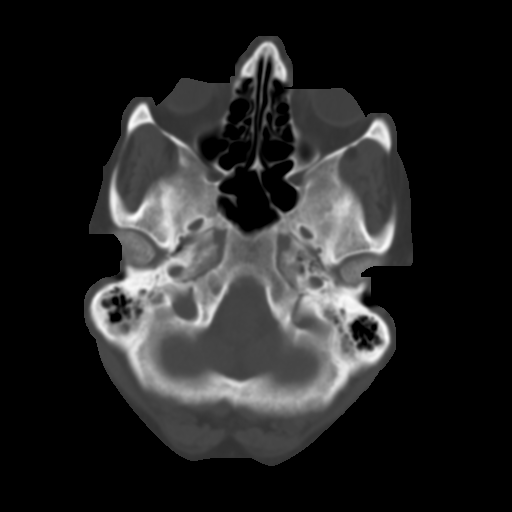
[im 8/31  brain]
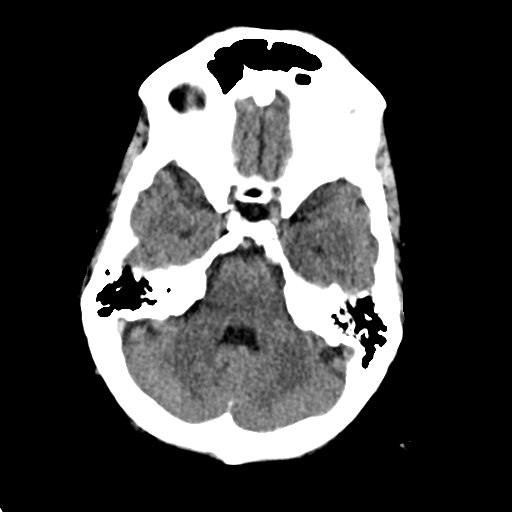
[im 12/31  brain]
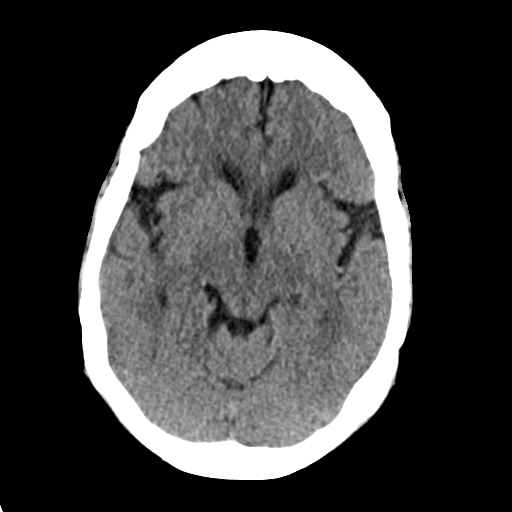
[im 16/31  brain]
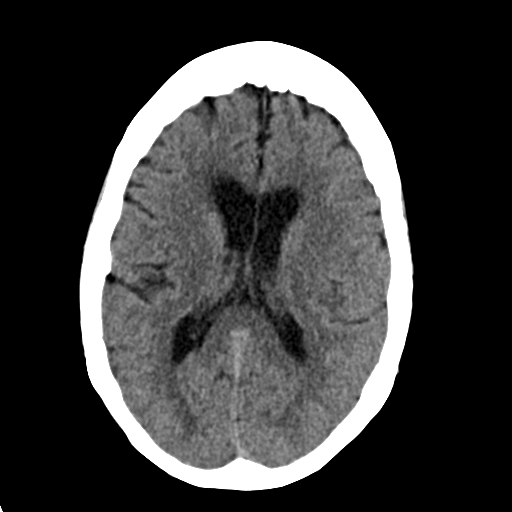
[im 19/31  brain]
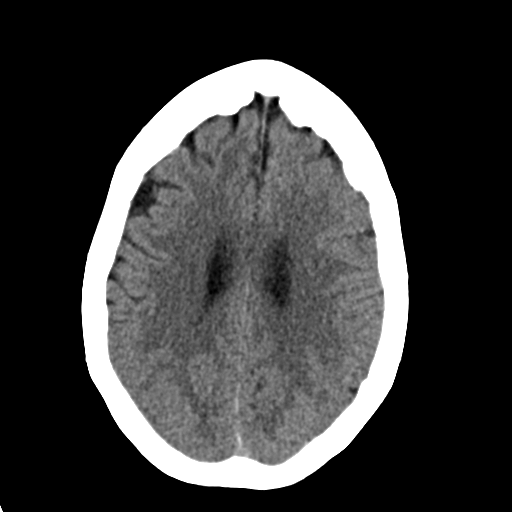
[im 19/31  bone]
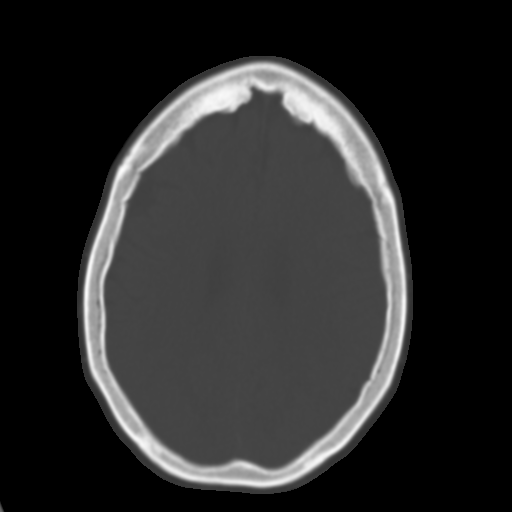
[im 23/31  brain]
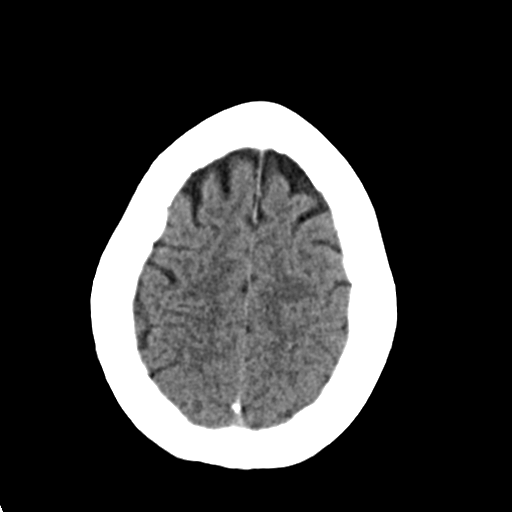
[im 27/31  brain]
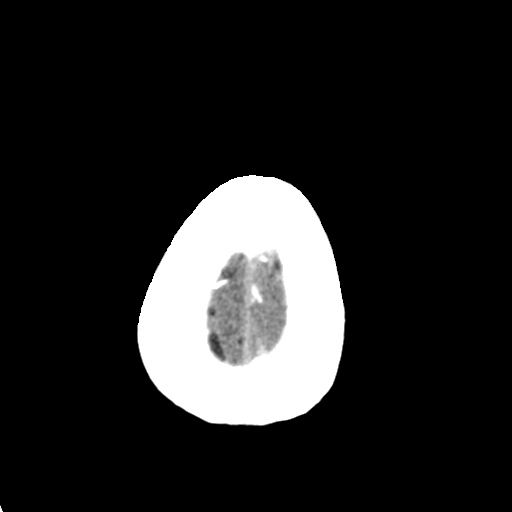

[Series 3: head bone · axial · 0.41mm/px · z∈[+1304,+1334]mm · 3 of 76 slices shown]
[im 8/76  bone]
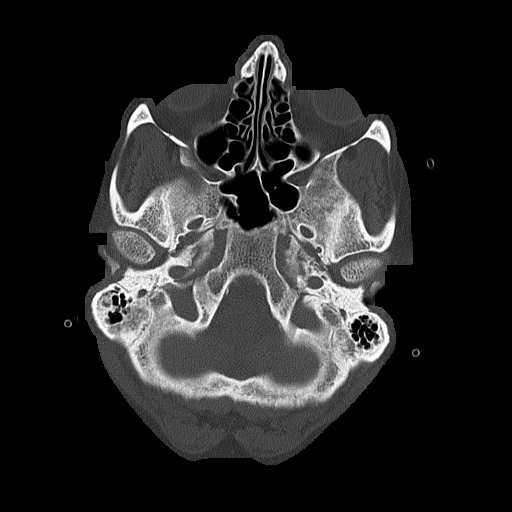
[im 16/76  bone]
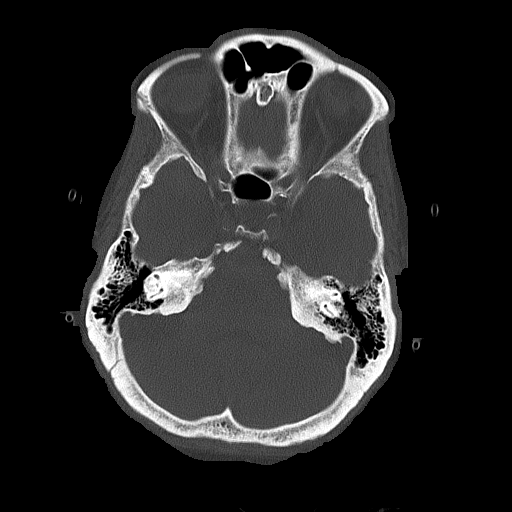
[im 23/76  bone]
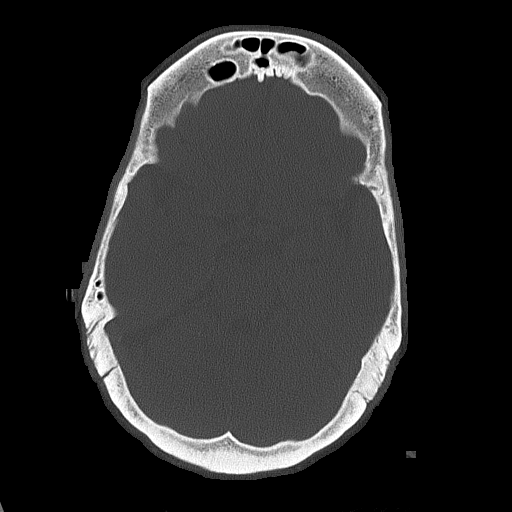

[Series 4: coronal soft tissue · coronal · 0.29mm/px · 3 of 67 slices shown]
[im 23/67  brain]
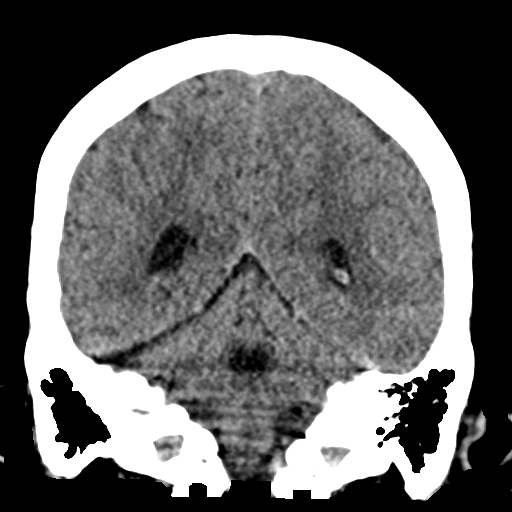
[im 30/67  brain]
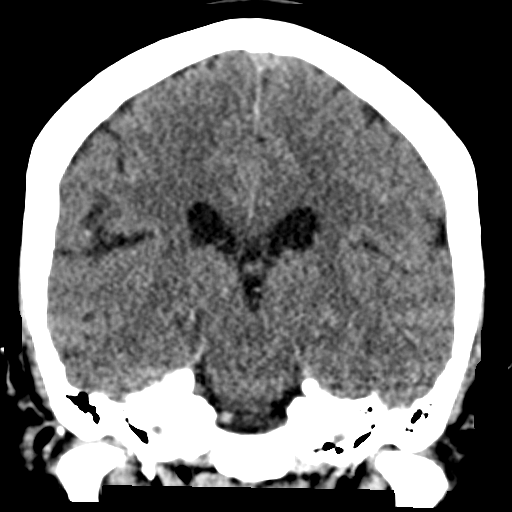
[im 37/67  brain]
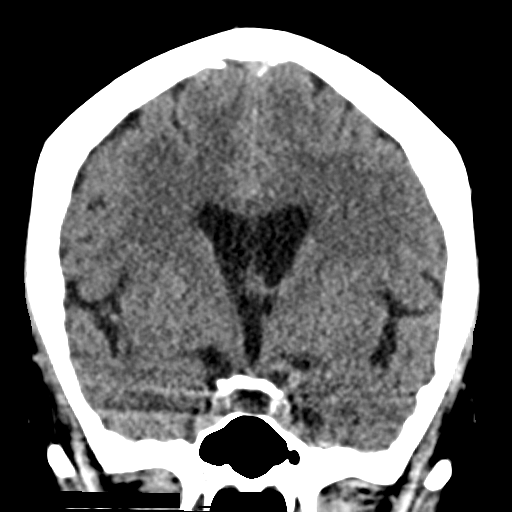

[Series 5: sagittal soft tissue · sagittal · 0.29mm/px · 3 of 51 slices shown]
[im 17/51  brain]
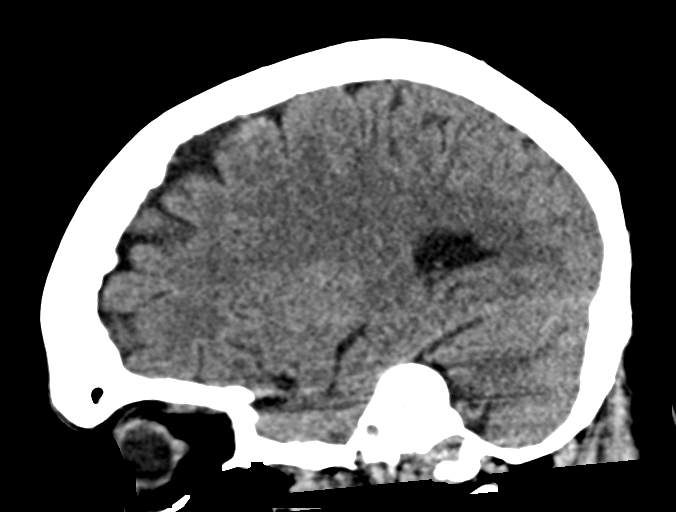
[im 26/51  brain]
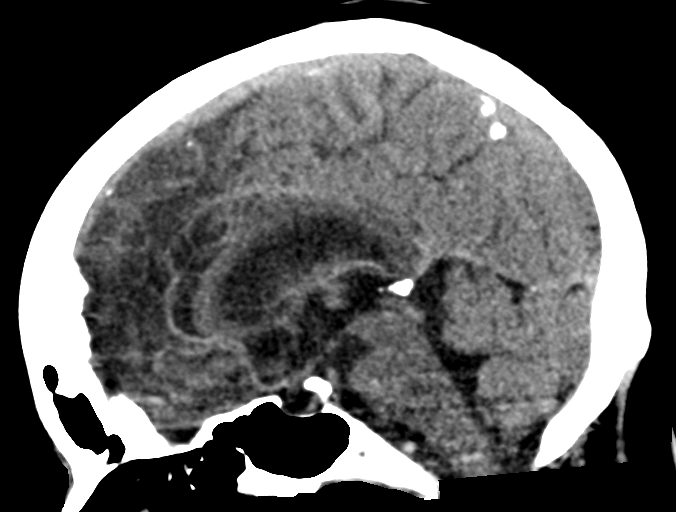
[im 34/51  brain]
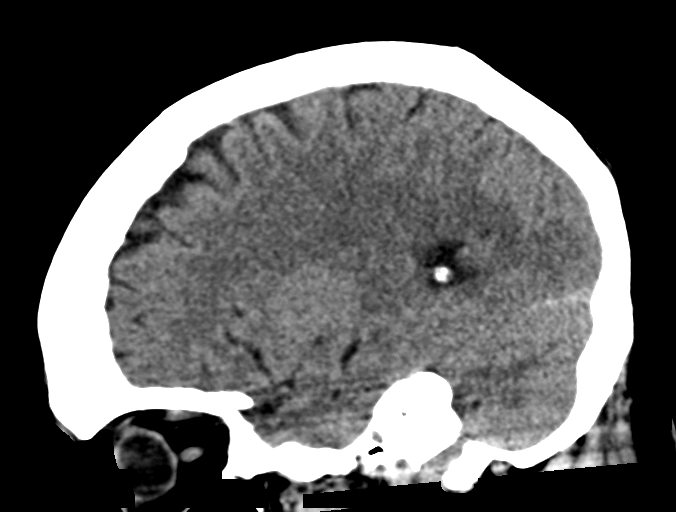

[16 of 47 positions shown; findings below may reference images not displayed]

FINDINGS: Brain: There is no evidence of an acute infarct, intracranial
hemorrhage, mass, midline shift, or extra-axial fluid collection.
Slight cerebral atrophy and minimal chronic small vessel ischemia in
the periventricular white matter are again noted.

Vascular: Calcified atherosclerosis at the skull base. No hyperdense
vessel.

Skull: No fracture or suspicious osseous lesion.

Sinuses/Orbits: Visualized paranasal sinuses and mastoid air cells
are clear. Unremarkable orbits.

Other: None.
IMPRESSION: No evidence of acute intracranial abnormality.

## 2021-03-29 MED ORDER — HYDRALAZINE HCL 20 MG/ML IJ SOLN
10.0000 mg | INTRAMUSCULAR | Status: DC | PRN
  Administered 2021-03-29 – 2021-03-30 (×2): 10 mg via INTRAVENOUS
  Filled 2021-03-29 (×4): qty 1

## 2021-03-29 MED ORDER — POTASSIUM CHLORIDE 10 MEQ/100ML IV SOLN
10.0000 meq | INTRAVENOUS | Status: AC
Start: 2021-03-29 — End: 2021-03-29
  Administered 2021-03-29 (×3): 10 meq via INTRAVENOUS
  Filled 2021-03-29 (×3): qty 100

## 2021-03-29 MED ORDER — POTASSIUM CHLORIDE 10 MEQ/100ML IV SOLN
10.0000 meq | INTRAVENOUS | Status: AC
  Administered 2021-03-29 (×6): 10 meq via INTRAVENOUS
  Filled 2021-03-29 (×6): qty 100

## 2021-03-29 MED ORDER — ORAL CARE MOUTH RINSE
15.0000 mL | Freq: Two times a day (BID) | OROMUCOSAL | Status: DC
  Administered 2021-03-29 – 2021-04-03 (×12): 15 mL via OROMUCOSAL

## 2021-03-29 MED ORDER — HYDRALAZINE HCL 20 MG/ML IJ SOLN
10.0000 mg | Freq: Once | INTRAMUSCULAR | Status: AC
  Administered 2021-03-29: 10 mg via INTRAVENOUS
  Filled 2021-03-29: qty 1

## 2021-03-29 MED ORDER — MAGNESIUM SULFATE 4 GM/100ML IV SOLN
4.0000 g | Freq: Once | INTRAVENOUS | Status: AC
  Administered 2021-03-29: 4 g via INTRAVENOUS
  Filled 2021-03-29: qty 100

## 2021-03-29 MED ORDER — METOPROLOL TARTRATE 5 MG/5ML IV SOLN
5.0000 mg | Freq: Four times a day (QID) | INTRAVENOUS | Status: DC
  Administered 2021-03-29 – 2021-03-30 (×3): 5 mg via INTRAVENOUS
  Filled 2021-03-29 (×3): qty 5

## 2021-03-29 MED ORDER — KETOROLAC TROMETHAMINE 15 MG/ML IJ SOLN
15.0000 mg | Freq: Once | INTRAMUSCULAR | Status: AC
  Administered 2021-03-29: 15 mg via INTRAVENOUS
  Filled 2021-03-29: qty 1

## 2021-03-29 MED ORDER — PANTOPRAZOLE SODIUM 40 MG IV SOLR
40.0000 mg | INTRAVENOUS | Status: DC
  Administered 2021-03-29 – 2021-04-02 (×5): 40 mg via INTRAVENOUS
  Filled 2021-03-29 (×4): qty 40

## 2021-03-29 MED ORDER — SODIUM CHLORIDE 0.9% FLUSH
10.0000 mL | INTRAVENOUS | Status: DC | PRN
  Administered 2021-04-03: 10 mL
  Administered 2021-04-04: 20 mL

## 2021-03-29 MED ORDER — LABETALOL HCL 5 MG/ML IV SOLN: 10.0000 mg | INTRAVENOUS | Status: DC | PRN

## 2021-03-29 MED ORDER — METOPROLOL TARTRATE 5 MG/5ML IV SOLN
10.0000 mg | Freq: Four times a day (QID) | INTRAVENOUS | Status: DC
  Administered 2021-03-29: 10 mg via INTRAVENOUS
  Filled 2021-03-29 (×2): qty 10

## 2021-03-29 MED ORDER — SODIUM CHLORIDE 0.9% FLUSH
10.0000 mL | Freq: Two times a day (BID) | INTRAVENOUS | Status: DC
  Administered 2021-03-30 – 2021-04-04 (×11): 10 mL

## 2021-03-29 MED ORDER — ACETAMINOPHEN 650 MG RE SUPP
650.0000 mg | Freq: Four times a day (QID) | RECTAL | Status: DC | PRN
  Administered 2021-03-29 – 2021-03-30 (×3): 650 mg via RECTAL
  Filled 2021-03-29 (×3): qty 1

## 2021-03-29 MED ORDER — LABETALOL HCL 5 MG/ML IV SOLN
5.0000 mg | INTRAVENOUS | Status: DC | PRN
  Administered 2021-03-29 – 2021-04-02 (×7): 5 mg via INTRAVENOUS
  Filled 2021-03-29 (×4): qty 4

## 2021-03-29 MED ORDER — ENALAPRILAT 1.25 MG/ML IV SOLN: 0.6250 mg | Freq: Once | INTRAVENOUS | Status: DC

## 2021-03-29 NOTE — Progress Notes (Signed)
PHARMACY - PHYSICIAN COMMUNICATION CRITICAL VALUE ALERT - BLOOD CULTURE IDENTIFICATION (BCID)  Stephanie Carrillo is an 65 y.o. female who presented to [facility] on 03/28/2021 with a chief complaint of AMS  Assessment:  Recent admisson 8/14-8/18 d/c on Bactrim for UTI which was continued despite resistance by PCP as asymptomatic. On cefepime with GNR in 1/4 BCID detecting E coli without resistance.   Name of physician (or Provider) Contacted: Dr. Rhona Leavens  Current antibiotics: cefepime 2 g iv q 8 hours  Changes to prescribed antibiotics recommended:  Consider transition to ceftriaxone 2 g iv daily  Results for orders placed or performed during the hospital encounter of 03/28/21  Blood Culture ID Panel (Reflexed) (Collected: 03/28/2021  1:00 PM)  Result Value Ref Range   Enterococcus faecalis NOT DETECTED NOT DETECTED   Enterococcus Faecium NOT DETECTED NOT DETECTED   Listeria monocytogenes NOT DETECTED NOT DETECTED   Staphylococcus species NOT DETECTED NOT DETECTED   Staphylococcus aureus (BCID) NOT DETECTED NOT DETECTED   Staphylococcus epidermidis NOT DETECTED NOT DETECTED   Staphylococcus lugdunensis NOT DETECTED NOT DETECTED   Streptococcus species NOT DETECTED NOT DETECTED   Streptococcus agalactiae NOT DETECTED NOT DETECTED   Streptococcus pneumoniae NOT DETECTED NOT DETECTED   Streptococcus pyogenes NOT DETECTED NOT DETECTED   A.calcoaceticus-baumannii NOT DETECTED NOT DETECTED   Bacteroides fragilis NOT DETECTED NOT DETECTED   Enterobacterales DETECTED (A) NOT DETECTED   Enterobacter cloacae complex NOT DETECTED NOT DETECTED   Escherichia coli DETECTED (A) NOT DETECTED   Klebsiella aerogenes NOT DETECTED NOT DETECTED   Klebsiella oxytoca NOT DETECTED NOT DETECTED   Klebsiella pneumoniae NOT DETECTED NOT DETECTED   Proteus species NOT DETECTED NOT DETECTED   Salmonella species NOT DETECTED NOT DETECTED   Serratia marcescens NOT DETECTED NOT DETECTED   Haemophilus influenzae NOT  DETECTED NOT DETECTED   Neisseria meningitidis NOT DETECTED NOT DETECTED   Pseudomonas aeruginosa NOT DETECTED NOT DETECTED   Stenotrophomonas maltophilia NOT DETECTED NOT DETECTED   Candida albicans NOT DETECTED NOT DETECTED   Candida auris NOT DETECTED NOT DETECTED   Candida glabrata NOT DETECTED NOT DETECTED   Candida krusei NOT DETECTED NOT DETECTED   Candida parapsilosis NOT DETECTED NOT DETECTED   Candida tropicalis NOT DETECTED NOT DETECTED   Cryptococcus neoformans/gattii NOT DETECTED NOT DETECTED   CTX-M ESBL NOT DETECTED NOT DETECTED   Carbapenem resistance IMP NOT DETECTED NOT DETECTED   Carbapenem resistance KPC NOT DETECTED NOT DETECTED   Carbapenem resistance NDM NOT DETECTED NOT DETECTED   Carbapenem resist OXA 48 LIKE NOT DETECTED NOT DETECTED   Carbapenem resistance VIM NOT DETECTED NOT DETECTED    Luisa Hart D 03/29/2021  10:14 AM

## 2021-03-29 NOTE — TOC Initial Note (Signed)
Transition of Care [facility]) - Initial/Assessment Note    Patient Details  Name: Stephanie Carrillo MRN: [id number] Date of Birth: 1955/11/12  Transition of Care [facility]) CM/SW Contact:    Golda Acre, RN Phone Number: 03/29/2021, 8:33 AM  Clinical Narrative:                 65 y.o. female with medical history significant of chronic pain, polypharmacy, and HTN comes in with AMS.  This presentation is similar per nursing to her presentation 2 weeks ago when she was hospitalized with a UTI.   Per chart review patient lives home alone but is frequently checked on by her friend.  Today, her friend found her confused and febrile.   In the ER, she was given, IVF, broad spectrum IV abx.     Per discussion with nursing, she was actually more altered last admission and required Geodon.   Her urine culture grew e coli last admission, resistant to bactrim and cipro.   PDMP shows new short term tramadol prescription on 8/19.      toc plan of care: Following for progression and toc needs.   Review of Systems: unable to do due to AMS   Expected Discharge Plan: Home/Self Care Barriers to Discharge: Continued Medical Work up   Patient Goals and CMS Choice        Expected Discharge Plan and Services Expected Discharge Plan: Home/Self Care   Discharge Planning Services: CM Consult   Living arrangements for the past 2 months: Apartment Expected Discharge Date:  (unknown)                                    Prior Living Arrangements/Services Living arrangements for the past 2 months: Apartment Lives with:: Self                   Activities of Daily Living Home Assistive Devices/Equipment: Eyeglasses, Grab bars in shower, Other (Comment) (walk-in shower) ADL Screening (condition at time of admission) Patient's cognitive ability adequate to safely complete daily activities?: No Is the patient deaf or have difficulty hearing?: No Does the patient have difficulty seeing, even when  wearing glasses/contacts?: No Does the patient have difficulty concentrating, remembering, or making decisions?: Yes Patient able to express need for assistance with ADLs?: Yes Does the patient have difficulty dressing or bathing?: No Independently performs ADLs?: Yes (appropriate for developmental age) Does the patient have difficulty walking or climbing stairs?: Yes (secondary to weakness) Weakness of Legs: Both Weakness of Arms/Hands: None  Permission Sought/Granted                  Emotional Assessment       Orientation: : Fluctuating Orientation (Suspected and/or reported Sundowners) Alcohol / Substance Use: Illicit Drugs Psych Involvement: No (comment)  Admission diagnosis:  Sepsis (HCC) [A41.9] Sepsis, due to unspecified organism, unspecified whether acute organ dysfunction present [facility]) [A41.9] Patient Active Problem List   Diagnosis Date Noted   Sepsis (HCC) 03/28/2021   Medication withdrawal (HCC) 03/18/2021   Hypertensive urgency 03/18/2021   Accelerated hypertension 03/17/2021   Acute encephalopathy 03/17/2021   AKI (acute kidney injury) (HCC)    Fall    UTI (urinary tract infection) 10/04/2020   Nausea vomiting and diarrhea    Acute metabolic encephalopathy 02/23/2020   Benzodiazepine withdrawal (HCC) 02/01/2020   Hypokalemia    Tachycardia    Altered mental state 01/27/2020  Anxiety and depression 09/23/2018   Insomnia 09/23/2018   Chronic pain syndrome 09/23/2018   PCP:  Lavada Mesi, MD Pharmacy:   [id number] - [place], [place] - [address] AT [facility] OF [address] & [address] [facility] [id number] Phone: [phone] Fax: [phone]     Social Determinants of Health (SDOH) Interventions    Readmission Risk Interventions No flowsheet data found.

## 2021-03-29 NOTE — Progress Notes (Signed)
Peripherally Inserted Central Catheter Placement  The IV Nurse has discussed with the patient and/or persons authorized to consent for the patient, the purpose of this procedure and the potential benefits and risks involved with this procedure.  The benefits include less needle sticks, lab draws from the catheter, and the patient may be discharged home with the catheter. Risks include, but not limited to, infection, bleeding, blood clot (thrombus formation), and puncture of an artery; nerve damage and irregular heartbeat and possibility to perform a PICC exchange if needed/ordered by physician.  Alternatives to this procedure were also discussed.  Bard Power PICC patient education guide, fact sheet on infection prevention and patient information card has been provided to patient /or left at bedside.    PICC Placement Documentation  PICC Double Lumen 03/29/21 PICC Right Brachial 38 cm 0 cm (Active)  Indication for Insertion or Continuance of Line Poor Vasculature-patient has had multiple peripheral attempts or PIVs lasting less than 24 hours 03/29/21 1122  Exposed Catheter (cm) 0 cm 03/29/21 1122  Site Assessment Clean;Dry;Intact 03/29/21 1122  Lumen #1 Status Flushed;Saline locked;Blood return noted 03/29/21 1122  Lumen #2 Status Flushed;Blood return noted;Saline locked 03/29/21 1122  Dressing Type Transparent;Securing device 03/29/21 1122  Antimicrobial disc in place? Yes 03/29/21 1122  Safety Lock Not Applicable 03/29/21 1122  Dressing Change Due 04/05/21 03/29/21 1122       Romie Jumper 03/29/2021, 11:25 AM

## 2021-03-29 NOTE — Progress Notes (Signed)
PROGRESS NOTE    Stephanie Carrillo  LNL:[id number] DOB: January 11, 1956 DOA: 03/28/2021 PCP: Lavada Mesi, MD    Brief Narrative:  65 y.o. female with medical history significant of chronic pain, polypharmacy, and HTN comes in with AMS. Pt was recently discharged home for AMS with UTI resistant to bactrim  Assessment & Plan:   Active Problems:   Anxiety and depression   Chronic pain syndrome   Acute encephalopathy   Hypertensive urgency   Sepsis (HCC)    Sepsis due to suspected UTI -Recent urine cx pos for ecoli resistant to bactrim, failed course of bactrim prior to this admit -currently on cefepime -Pt now found to have gm neg bacteremia -Cont cefepime for now, pending speciation and sensitivities    HTN urgency -very poorly controlled prior to visit -Now much better controlled once resumed norvasc with added beta blocker   Acute metabolic encephalopathy -similar presentation in past that improved with IV abx -Also consideration for polypharmacy noted   Chronic pain -once able to take PO and closer to baseline, will need to resume home meds   Anxiety/depression -resume meds as able once infection treated  DVT prophylaxis: Lovenox subq Code Status: DNR Family Communication: Pt in room, family is at bedside  Status is: Inpatient  Remains inpatient appropriate because:Inpatient level of care appropriate due to severity of illness  Dispo: The patient is from: Home              Anticipated d/c is to: Home              Patient currently is not medically stable to d/c.   Difficult to place patient No       Consultants:    Procedures:    Antimicrobials: Anti-infectives (From admission, onward)    Start     Dose/Rate Route Frequency Ordered Stop   03/28/21 2200  ceFEPIme (MAXIPIME) 2 g in sodium chloride 0.9 % 100 mL IVPB        2 g 200 mL/hr over 30 Minutes Intravenous Every 8 hours 03/28/21 1559     03/28/21 1430  aztreonam (AZACTAM) 2 g in sodium chloride  0.9 % 100 mL IVPB  Status:  Discontinued        2 g 200 mL/hr over 30 Minutes Intravenous  Once 03/28/21 1418 03/28/21 1427   03/28/21 1430  metroNIDAZOLE (FLAGYL) IVPB 500 mg        500 mg 100 mL/hr over 60 Minutes Intravenous  Once 03/28/21 1418 03/28/21 1637   03/28/21 1430  vancomycin (VANCOCIN) IVPB 1000 mg/200 mL premix        1,000 mg 200 mL/hr over 60 Minutes Intravenous  Once 03/28/21 1418 03/28/21 1706   03/28/21 1430  ceFEPIme (MAXIPIME) 2 g in sodium chloride 0.9 % 100 mL IVPB        2 g 200 mL/hr over 30 Minutes Intravenous  Once 03/28/21 1427 03/28/21 1530       Subjective: Confused, unable to assess  Objective: Vitals:   03/29/21 1012 03/29/21 1100 03/29/21 1200 03/29/21 1227  BP: (!) 141/87 (!) 166/80 132/82   Pulse: (!) 118  (!) 120 (!) 118  Resp: 16 16 (!) 27 (!) 24  Temp:   99.1 F (37.3 C)   TempSrc:   Axillary   SpO2: 95%  93% 91%  Weight:      Height:        Intake/Output Summary (Last 24 hours) at 03/29/2021 1422 Last data filed at 03/29/2021 1229 Gross  per 24 hour  Intake 333.82 ml  Output 2050 ml  Net -1716.18 ml   Filed Weights   03/28/21 1303 03/28/21 1700  Weight: 86.8 kg 66.3 kg    Examination: General exam: Awake, laying in bed, in nad Respiratory system: Normal respiratory effort, no wheezing Cardiovascular system: regular rate, s1, s2 Gastrointestinal system: Soft, nondistended, positive BS Central nervous system: CN2-12 grossly intact, strength intact Extremities: Perfused, no clubbing Skin: Normal skin turgor, no notable skin lesions seen Psychiatry: Difficult to assess given mentation  Data Reviewed: I have personally reviewed following labs and imaging studies  CBC: Recent Labs  Lab 03/28/21 1255 03/29/21 0256  WBC 15.8* 18.4*  NEUTROABS 13.9*  --   HGB 13.3 13.8  HCT 40.6 41.9  MCV 96.9 97.0  PLT 344 342   Basic Metabolic Panel: Recent Labs  Lab 03/28/21 1255 03/29/21 0256  NA 140 137  K 3.7 2.7*  CL 107 98   CO2 22 26  GLUCOSE 114* 124*  BUN 17 11  CREATININE 0.78 0.62  CALCIUM 9.4 9.2  MG  --  1.6*   GFR: Estimated Creatinine Clearance: 68.2 mL/min (by C-G formula based on SCr of 0.62 mg/dL). Liver Function Tests: Recent Labs  Lab 03/28/21 1255 03/29/21 0256  AST 18 23  ALT 23 23  ALKPHOS 102 112  BILITOT 0.5 0.8  PROT 8.3* 8.3*  ALBUMIN 4.0 3.7   No results for input(s): LIPASE, AMYLASE in the last 168 hours. No results for input(s): AMMONIA in the last 168 hours. Coagulation Profile: Recent Labs  Lab 03/28/21 1255  INR 1.0   Cardiac Enzymes: No results for input(s): CKTOTAL, CKMB, CKMBINDEX, TROPONINI in the last 168 hours. BNP (last 3 results) No results for input(s): PROBNP in the last 8760 hours. HbA1C: No results for input(s): HGBA1C in the last 72 hours. CBG: No results for input(s): GLUCAP in the last 168 hours. Lipid Profile: No results for input(s): CHOL, HDL, LDLCALC, TRIG, CHOLHDL, LDLDIRECT in the last 72 hours. Thyroid Function Tests: No results for input(s): TSH, T4TOTAL, FREET4, T3FREE, THYROIDAB in the last 72 hours. Anemia Panel: No results for input(s): VITAMINB12, FOLATE, FERRITIN, TIBC, IRON, RETICCTPCT in the last 72 hours. Sepsis Labs: Recent Labs  Lab 03/28/21 1255 03/28/21 1712  LATICACIDVEN 1.0 1.2    Recent Results (from the past 240 hour(s))  Urine Culture     Status: Abnormal   Collection Time: 03/21/21  9:01 AM   Specimen: Urine, Catheterized  Result Value Ref Range Status   Specimen Description   Final    URINE, CATHETERIZED Performed at [facility], [address] [place], [place] [address]    Special Requests   Final    NONE Performed at [facility], [address] [place], [place] [address]    Culture >=100,000 COLONIES/mL ESCHERICHIA COLI (A)  Final   Report Status 03/24/2021 FINAL  Final   Organism ID, Bacteria ESCHERICHIA COLI (A)  Final      Susceptibility   Escherichia  coli - MIC*    AMPICILLIN <=2 SENSITIVE Sensitive     CEFAZOLIN <=4 SENSITIVE Sensitive     CEFEPIME <=0.12 SENSITIVE Sensitive     CEFTRIAXONE <=0.25 SENSITIVE Sensitive     CIPROFLOXACIN >=4 RESISTANT Resistant     GENTAMICIN <=1 SENSITIVE Sensitive     IMIPENEM <=0.25 SENSITIVE Sensitive     NITROFURANTOIN <=16 SENSITIVE Sensitive     TRIMETH/SULFA >=320 RESISTANT Resistant     AMPICILLIN/SULBACTAM <=2  SENSITIVE Sensitive     PIP/TAZO <=4 SENSITIVE Sensitive     * >=100,000 COLONIES/mL ESCHERICHIA COLI  Culture, blood (Routine x 2)     Status: None (Preliminary result)   Collection Time: 03/28/21 12:55 PM   Specimen: BLOOD RIGHT WRIST  Result Value Ref Range Status   Specimen Description   Final    BLOOD RIGHT WRIST Performed at [facility], [address] [place], [place] [address]    Special Requests   Final    BOTTLES DRAWN AEROBIC AND ANAEROBIC Blood Culture adequate volume Performed at [facility], [address] [place], [place] [address]    Culture   Final    NO GROWTH < 24 HOURS Performed at [facility], [address] [place], [place] [address]    Report Status PENDING  Incomplete  Culture, blood (Routine x 2)     Status: None (Preliminary result)   Collection Time: 03/28/21  1:00 PM   Specimen: BLOOD  Result Value Ref Range Status   Specimen Description   Final    BLOOD LEFT ARM Performed at [facility], [address] [place], [place] [address]    Special Requests   Final    BOTTLES DRAWN AEROBIC AND ANAEROBIC Blood Culture adequate volume Performed at [facility], [address] [place], [place] [address]    Culture  Setup Time   Final    GRAM NEGATIVE RODS AEROBIC BOTTLE ONLY CRITICAL RESULT CALLED TO, READ BACK BY AND VERIFIED WITH: PHARMD JUSTIN LEGGE  03/29/21 @0949  BY JW Performed at [facility], [address] [place], [place] [address]    Culture GRAM NEGATIVE  RODS  Final   Report Status PENDING  Incomplete  Blood Culture ID Panel (Reflexed)     Status: Abnormal   Collection Time: 03/28/21  1:00 PM  Result Value Ref Range Status   Enterococcus faecalis NOT DETECTED NOT DETECTED Final   Enterococcus Faecium NOT DETECTED NOT DETECTED Final   Listeria monocytogenes NOT DETECTED NOT DETECTED Final   Staphylococcus species NOT DETECTED NOT DETECTED Final   Staphylococcus aureus (BCID) NOT DETECTED NOT DETECTED Final   Staphylococcus epidermidis NOT DETECTED NOT DETECTED Final   Staphylococcus lugdunensis NOT DETECTED NOT DETECTED Final   Streptococcus species NOT DETECTED NOT DETECTED Final   Streptococcus agalactiae NOT DETECTED NOT DETECTED Final   Streptococcus pneumoniae NOT DETECTED NOT DETECTED Final   Streptococcus pyogenes NOT DETECTED NOT DETECTED Final   A.calcoaceticus-baumannii NOT DETECTED NOT DETECTED Final   Bacteroides fragilis NOT DETECTED NOT DETECTED Final   Enterobacterales DETECTED (A) NOT DETECTED Final    Comment: Enterobacterales represent a large order of gram negative bacteria, not a single organism. CRITICAL RESULT CALLED TO, READ BACK BY AND VERIFIED WITH: PHARMD JUSTIN LEGG 03/29/21 @0951  BY JW    Enterobacter cloacae complex NOT DETECTED NOT DETECTED Final   Escherichia coli DETECTED (A) NOT DETECTED Final    Comment: CRITICAL RESULT CALLED TO, READ BACK BY AND VERIFIED WITH: PHARMD JUSTIN LEGG 03/29/21 @0951  BY JW    Klebsiella aerogenes NOT DETECTED NOT DETECTED Final   Klebsiella oxytoca NOT DETECTED NOT DETECTED Final   Klebsiella pneumoniae NOT DETECTED NOT DETECTED Final   Proteus species NOT DETECTED NOT DETECTED Final   Salmonella species NOT DETECTED NOT DETECTED Final   Serratia marcescens NOT DETECTED NOT DETECTED Final   Haemophilus influenzae NOT DETECTED NOT DETECTED Final   Neisseria meningitidis NOT  DETECTED NOT DETECTED Final   Pseudomonas aeruginosa NOT DETECTED NOT DETECTED Final    Stenotrophomonas maltophilia NOT DETECTED NOT DETECTED Final   Candida albicans NOT DETECTED NOT DETECTED Final   Candida auris NOT DETECTED NOT DETECTED Final   Candida glabrata NOT DETECTED NOT DETECTED Final   Candida krusei NOT DETECTED NOT DETECTED Final   Candida parapsilosis NOT DETECTED NOT DETECTED Final   Candida tropicalis NOT DETECTED NOT DETECTED Final   Cryptococcus neoformans/gattii NOT DETECTED NOT DETECTED Final   CTX-M ESBL NOT DETECTED NOT DETECTED Final   Carbapenem resistance IMP NOT DETECTED NOT DETECTED Final   Carbapenem resistance KPC NOT DETECTED NOT DETECTED Final   Carbapenem resistance NDM NOT DETECTED NOT DETECTED Final   Carbapenem resist OXA 48 LIKE NOT DETECTED NOT DETECTED Final   Carbapenem resistance VIM NOT DETECTED NOT DETECTED Final    Comment: Performed at [facility], [address] [place], [place] [address]  SARS CORONAVIRUS 2 (TAT 6-24 HRS) Nasopharyngeal Nasopharyngeal Swab     Status: None   Collection Time: 03/28/21  2:47 PM   Specimen: Nasopharyngeal Swab  Result Value Ref Range Status   SARS Coronavirus 2 NEGATIVE NEGATIVE Final    Comment: (NOTE) SARS-CoV-2 target nucleic acids are NOT DETECTED.  The SARS-CoV-2 RNA is generally detectable in upper and lower respiratory specimens during the acute phase of infection. Negative results do not preclude SARS-CoV-2 infection, do not rule out co-infections with other pathogens, and should not be used as the sole basis for treatment or other patient management decisions. Negative results must be combined with clinical observations, patient history, and epidemiological information. The expected result is Negative.  Fact Sheet for Patients: HairSlick.no  Fact Sheet for Healthcare Providers: quierodirigir.com  This test is not yet approved or cleared by the [place] FDA and  has been authorized for detection and/or diagnosis  of SARS-CoV-2 by FDA under an Emergency Use Authorization (EUA). This EUA will remain  in effect (meaning this test can be used) for the duration of the COVID-19 declaration under Se ction 564(b)(1) of the Act, 21 U.S.C. section 360bbb-3(b)(1), unless the authorization is terminated or revoked sooner.  Performed at [facility], [address], [place], [place] [address]   MRSA Next Gen by PCR, Nasal     Status: None   Collection Time: 03/28/21  5:01 PM   Specimen: Nasal Mucosa; Nasal Swab  Result Value Ref Range Status   MRSA by PCR Next Gen NOT DETECTED NOT DETECTED Final    Comment: (NOTE) The GeneXpert MRSA Assay (FDA approved for NASAL specimens only), is one component of a comprehensive MRSA colonization surveillance program. It is not intended to diagnose MRSA infection nor to guide or monitor treatment for MRSA infections. Test performance is not FDA approved in patients less than 22 years old. Performed at [facility], [address], [place], [place] [address]      Radiology Studies: DG Chest 2 View  Result Date: 03/28/2021 CLINICAL DATA:  Suspected sepsis, fever EXAM: CHEST - 2 VIEW COMPARISON:  03/18/2021 FINDINGS: Cardiomegaly. Both lungs are clear. Disc degenerative disease of the thoracic spine. IMPRESSION: Cardiomegaly without acute abnormality of the lungs. Electronically Signed   By: Lauralyn Primes M.D.   On: 03/28/2021 14:58   CT HEAD WO CONTRAST ( )  Result Date: 03/29/2021 CLINICAL DATA:  Mental status change, unknown cause. Fever, sepsis, and confusion. EXAM: CT HEAD WITHOUT CONTRAST TECHNIQUE: Contiguous axial images were obtained  from the base of the skull through the vertex without intravenous contrast. COMPARISON:  Head CT and MRI 03/17/2021 FINDINGS: Brain: There is no evidence of an acute infarct, intracranial hemorrhage, mass, midline shift, or extra-axial fluid collection. Slight cerebral atrophy and minimal chronic small vessel  ischemia in the periventricular white matter are again noted. Vascular: Calcified atherosclerosis at the skull base. No hyperdense vessel. Skull: No fracture or suspicious osseous lesion. Sinuses/Orbits: Visualized paranasal sinuses and mastoid air cells are clear. Unremarkable orbits. Other: None. IMPRESSION: No evidence of acute intracranial abnormality. Electronically Signed   By: Sebastian Ache M.D.   On: 03/29/2021 13:07   [place] EKG SITE RITE  Result Date: 03/29/2021 If Site Rite image not attached, placement could not be confirmed due to current cardiac rhythm.   Scheduled Meds:  amLODipine  5 mg Oral Daily   Chlorhexidine Gluconate Cloth  6 each Topical Daily   enoxaparin (LOVENOX) injection  40 mg Subcutaneous Q24H   mouth rinse  15 mL Mouth Rinse BID   metoprolol tartrate  5 mg Intravenous Q6H   sodium chloride flush  10-40 mL Intracatheter Q12H   sodium chloride flush  3 mL Intravenous Q12H   Continuous Infusions:  sodium chloride 250 mL (03/29/21 1410)   ceFEPime (MAXIPIME) IV 2 g (03/29/21 1412)   potassium chloride 10 mEq (03/29/21 1355)     LOS: 1 day   Rickey Barbara, MD [facility] Pager On Amion  If 7PM-7AM, please contact night-coverage 03/29/2021, 2:22 PM

## 2021-03-29 NOTE — Progress Notes (Signed)
Date and time results received: 03/29/21 0355 (use smartphrase ".now" to insert current time)  Test: K+ Critical Value: 2.7  Name of Provider Notified: [place]  Orders Received? Or Actions Taken?: K runs x3

## 2021-03-30 LAB — MAGNESIUM: Magnesium: 2.2 mg/dL (ref 1.7–2.4)

## 2021-03-30 LAB — CBC
HCT: 41.9 % (ref 36.0–46.0)
Hemoglobin: 13.5 g/dL (ref 12.0–15.0)
MCH: 31.5 pg (ref 26.0–34.0)
MCHC: 32.2 g/dL (ref 30.0–36.0)
MCV: 97.9 fL (ref 80.0–100.0)
Platelets: 336 10*3/uL (ref 150–400)
RBC: 4.28 MIL/uL (ref 3.87–5.11)
RDW: 14.9 % (ref 11.5–15.5)
WBC: 19.5 10*3/uL — ABNORMAL HIGH (ref 4.0–10.5)
nRBC: 0 % (ref 0.0–0.2)

## 2021-03-30 LAB — COMPREHENSIVE METABOLIC PANEL
ALT: 21 U/L (ref 0–44)
AST: 24 U/L (ref 15–41)
Albumin: 3.2 g/dL — ABNORMAL LOW (ref 3.5–5.0)
Alkaline Phosphatase: 103 U/L (ref 38–126)
Anion gap: 12 (ref 5–15)
BUN: 19 mg/dL (ref 8–23)
CO2: 28 mmol/L (ref 22–32)
Calcium: 9.3 mg/dL (ref 8.9–10.3)
Chloride: 99 mmol/L (ref 98–111)
Creatinine, Ser: 0.72 mg/dL (ref 0.44–1.00)
GFR, Estimated: >60 mL/min (ref >60)
Glucose, Bld: 126 mg/dL — ABNORMAL HIGH (ref 70–99)
Potassium: 3.5 mmol/L (ref 3.5–5.1)
Sodium: 139 mmol/L (ref 135–145)
Total Bilirubin: 0.7 mg/dL (ref 0.3–1.2)
Total Protein: 8 g/dL (ref 6.5–8.1)

## 2021-03-30 LAB — URINE CULTURE

## 2021-03-30 MED ORDER — METOPROLOL TARTRATE 5 MG/5ML IV SOLN
10.0000 mg | Freq: Four times a day (QID) | INTRAVENOUS | Status: AC
  Administered 2021-03-30 – 2021-03-31 (×7): 10 mg via INTRAVENOUS
  Filled 2021-03-30 (×8): qty 10

## 2021-03-30 MED ORDER — BUPRENORPHINE HCL 2 MG SL SUBL
2.0000 mg | SUBLINGUAL_TABLET | Freq: Four times a day (QID) | SUBLINGUAL | Status: DC
  Administered 2021-03-30 – 2021-04-05 (×25): 2 mg via SUBLINGUAL
  Filled 2021-03-30 (×26): qty 1

## 2021-03-30 MED ORDER — SODIUM CHLORIDE 0.9 % IV SOLN: INTRAVENOUS | Status: DC

## 2021-03-30 NOTE — Progress Notes (Signed)
PROGRESS NOTE    Stephanie Carrillo  UVO:[id number] DOB: [birth year] DOA: 03/28/2021 PCP: Stephanie Mesi, MD    Brief Narrative:  65 y.o. female with medical history significant of chronic pain, polypharmacy, and HTN comes in with AMS. Pt was recently discharged home for AMS with UTI resistant to bactrim  Assessment & Plan:   Active Problems:   Anxiety and depression   Chronic pain syndrome   Acute encephalopathy   Hypertensive urgency   Sepsis (HCC)    Sepsis due to suspected UTI with ecoli bacteremia -Recent urine cx pos for ecoli resistant to bactrim, failed course of bactrim prior to this admit -currently on cefepime, per urine cx results from last UTI -Pt with ecoli in blood, pending sensitivities    HTN urgency -very poorly controlled prior to visit -Now much better controlled once resumed norvasc with added beta blocker   Acute metabolic encephalopathy -similar presentation in past that improved with IV abx -Also consideration for polypharmacy noted -Mentation seems improved this AM   Chronic pain -once able to take PO and closer to baseline, will need to resume home meds   Anxiety/depression -resume meds as able once infection treated  DVT prophylaxis: Lovenox subq Code Status: DNR Family Communication: Pt in room, family is at bedside  Status is: Inpatient  Remains inpatient appropriate because:Inpatient level of care appropriate due to severity of illness  Dispo: The patient is from: Home              Anticipated d/c is to: Home              Patient currently is not medically stable to d/c.   Difficult to place patient No    Consultants:    Procedures:    Antimicrobials: Anti-infectives (From admission, onward)    Start     Dose/Rate Route Frequency Ordered Stop   03/28/21 2200  ceFEPIme (MAXIPIME) 2 g in sodium chloride 0.9 % 100 mL IVPB        2 g 200 mL/hr over 30 Minutes Intravenous Every 8 hours 03/28/21 1559     03/28/21 1430  aztreonam  (AZACTAM) 2 g in sodium chloride 0.9 % 100 mL IVPB  Status:  Discontinued        2 g 200 mL/hr over 30 Minutes Intravenous  Once 03/28/21 1418 03/28/21 1427   03/28/21 1430  metroNIDAZOLE (FLAGYL) IVPB 500 mg        500 mg 100 mL/hr over 60 Minutes Intravenous  Once 03/28/21 1418 03/28/21 1637   03/28/21 1430  vancomycin (VANCOCIN) IVPB 1000 mg/200 mL premix        1,000 mg 200 mL/hr over 60 Minutes Intravenous  Once 03/28/21 1418 03/28/21 1706   03/28/21 1430  ceFEPIme (MAXIPIME) 2 g in sodium chloride 0.9 % 100 mL IVPB        2 g 200 mL/hr over 30 Minutes Intravenous  Once 03/28/21 1427 03/28/21 1530       Subjective: More alert, without complaints  Objective: Vitals:   03/30/21 1130 03/30/21 1200 03/30/21 1213 03/30/21 1230  BP: (!) 167/78 (!) 178/95  (!) 175/93  Pulse: (!) 116 (!) 117 89 99  Resp: 20 17 11 13   Temp:  99 F (37.2 C)    TempSrc:  Oral    SpO2: 96% 95% 95% 96%  Weight:      Height:        Intake/Output Summary (Last 24 hours) at 03/30/2021 1250 Last data filed at 03/30/2021 1200  Gross per 24 hour  Intake 1167.07 ml  Output 1050 ml  Net 117.07 ml    Filed Weights   03/28/21 1303 03/28/21 1700  Weight: 86.8 kg 66.3 kg    Examination: General exam: Conversant, in no acute distress Respiratory system: normal chest rise, clear, no audible wheezing Cardiovascular system: regular rhythm, s1-s2 Gastrointestinal system: Nondistended, nontender, pos BS Central nervous system: No seizures, no tremors Extremities: No cyanosis, no joint deformities Skin: No rashes, no pallor Psychiatry: Affect normal // no auditory hallucinations   Data Reviewed: I have personally reviewed following labs and imaging studies  CBC: Recent Labs  Lab 03/28/21 1255 03/29/21 0256 03/29/21 1629 03/30/21 0431  WBC 15.8* 18.4*  --  19.5*  NEUTROABS 13.9*  --   --   --   HGB 13.3 13.8 12.8 13.5  HCT 40.6 41.9 38.6 41.9  MCV 96.9 97.0  --  97.9  PLT 344 342  --  336     Basic Metabolic Panel: Recent Labs  Lab 03/28/21 1255 03/29/21 0256 03/30/21 0431  NA 140 137 139  K 3.7 2.7* 3.5  CL 107 98 99  CO2 22 26 28   GLUCOSE 114* 124* 126*  BUN 17 11 19   CREATININE 0.78 0.62 0.72  CALCIUM 9.4 9.2 9.3  MG  --  1.6* 2.2    GFR: Estimated Creatinine Clearance: 68.2 mL/min (by C-G formula based on SCr of 0.72 mg/dL). Liver Function Tests: Recent Labs  Lab 03/28/21 1255 03/29/21 0256 03/30/21 0431  AST 18 23 24   ALT 23 23 21   ALKPHOS 102 112 103  BILITOT 0.5 0.8 0.7  PROT 8.3* 8.3* 8.0  ALBUMIN 4.0 3.7 3.2*    No results for input(s): LIPASE, AMYLASE in the last 168 hours. No results for input(s): AMMONIA in the last 168 hours. Coagulation Profile: Recent Labs  Lab 03/28/21 1255  INR 1.0    Cardiac Enzymes: No results for input(s): CKTOTAL, CKMB, CKMBINDEX, TROPONINI in the last 168 hours. BNP (last 3 results) No results for input(s): PROBNP in the last 8760 hours. HbA1C: No results for input(s): HGBA1C in the last 72 hours. CBG: No results for input(s): GLUCAP in the last 168 hours. Lipid Profile: No results for input(s): CHOL, HDL, LDLCALC, TRIG, CHOLHDL, LDLDIRECT in the last 72 hours. Thyroid Function Tests: No results for input(s): TSH, T4TOTAL, FREET4, T3FREE, THYROIDAB in the last 72 hours. Anemia Panel: No results for input(s): VITAMINB12, FOLATE, FERRITIN, TIBC, IRON, RETICCTPCT in the last 72 hours. Sepsis Labs: Recent Labs  Lab 03/28/21 1255 03/28/21 1712  LATICACIDVEN 1.0 1.2    Recent Results (from the past 240 hour(s))  Urine Culture     Status: Abnormal   Collection Time: 03/21/21  9:01 AM   Specimen: Urine, Catheterized  Result Value Ref Range Status   Specimen Description   Final    URINE, CATHETERIZED Performed at [facility], [address] [place], [place] [address]    Special Requests   Final    NONE Performed at [facility], [address]  [place], [place] [address]    Culture >=100,000 COLONIES/mL ESCHERICHIA COLI (A)  Final   Report Status 03/24/2021 FINAL  Final   Organism ID, Bacteria ESCHERICHIA COLI (A)  Final      Susceptibility   Escherichia coli - MIC*    AMPICILLIN <=2 SENSITIVE Sensitive     CEFAZOLIN <=4 SENSITIVE Sensitive     CEFEPIME <=0.12 SENSITIVE Sensitive  CEFTRIAXONE <=0.25 SENSITIVE Sensitive     CIPROFLOXACIN >=4 RESISTANT Resistant     GENTAMICIN <=1 SENSITIVE Sensitive     IMIPENEM <=0.25 SENSITIVE Sensitive     NITROFURANTOIN <=16 SENSITIVE Sensitive     TRIMETH/SULFA >=320 RESISTANT Resistant     AMPICILLIN/SULBACTAM <=2 SENSITIVE Sensitive     PIP/TAZO <=4 SENSITIVE Sensitive     * >=100,000 COLONIES/mL ESCHERICHIA COLI  Culture, blood (Routine x 2)     Status: None (Preliminary result)   Collection Time: 03/28/21 12:55 PM   Specimen: BLOOD RIGHT WRIST  Result Value Ref Range Status   Specimen Description   Final    BLOOD RIGHT WRIST Performed at [facility], [address] [place], [place] [address]    Special Requests   Final    BOTTLES DRAWN AEROBIC AND ANAEROBIC Blood Culture adequate volume Performed at [facility], [address], [place], [place] [address]    Culture   Final    NO GROWTH 2 DAYS Performed at [facility], [address] [place], [place] [address]    Report Status PENDING  Incomplete  Culture, blood (Routine x 2)     Status: Abnormal (Preliminary result)   Collection Time: 03/28/21  1:00 PM   Specimen: BLOOD  Result Value Ref Range Status   Specimen Description   Final    BLOOD LEFT ARM Performed at [facility], [address] [place], [place] [address]    Special Requests   Final    BOTTLES DRAWN AEROBIC AND ANAEROBIC Blood Culture adequate volume Performed at [facility], [address] [place], [place] [address]    Culture  Setup Time   Final    GRAM NEGATIVE RODS IN BOTH  AEROBIC AND ANAEROBIC BOTTLES CRITICAL RESULT CALLED TO, READ BACK BY AND VERIFIED WITH: PHARMD JUSTIN LEGGE  03/29/21 @0949  BY JW    Culture (A)  Final    ESCHERICHIA COLI CULTURE REINCUBATED FOR BETTER GROWTH Performed at [facility], [address], [place], [place]    Report Status PENDING  Incomplete  Blood Culture ID Panel (Reflexed)     Status: Abnormal   Collection Time: 03/28/21  1:00 PM  Result Value Ref Range Status   Enterococcus faecalis NOT DETECTED NOT DETECTED Final   Enterococcus Faecium NOT DETECTED NOT DETECTED Final   Listeria monocytogenes NOT DETECTED NOT DETECTED Final   Staphylococcus species NOT DETECTED NOT DETECTED Final   Staphylococcus aureus (BCID) NOT DETECTED NOT DETECTED Final   Staphylococcus epidermidis NOT DETECTED NOT DETECTED Final   Staphylococcus lugdunensis NOT DETECTED NOT DETECTED Final   Streptococcus species NOT DETECTED NOT DETECTED Final   Streptococcus agalactiae NOT DETECTED NOT DETECTED Final   Streptococcus pneumoniae NOT DETECTED NOT DETECTED Final   Streptococcus pyogenes NOT DETECTED NOT DETECTED Final   A.calcoaceticus-baumannii NOT DETECTED NOT DETECTED Final   Bacteroides fragilis NOT DETECTED NOT DETECTED Final   Enterobacterales DETECTED (A) NOT DETECTED Final    Comment: Enterobacterales represent a large order of gram negative bacteria, not a single organism. CRITICAL RESULT CALLED TO, READ BACK BY AND VERIFIED WITH: PHARMD JUSTIN LEGG 03/29/21 @0951  BY JW    Enterobacter cloacae complex NOT DETECTED NOT DETECTED Final   Escherichia coli DETECTED (A) NOT DETECTED Final    Comment: CRITICAL RESULT CALLED TO, READ BACK BY AND VERIFIED WITH: PHARMD JUSTIN LEGG 03/29/21 @0951  BY JW    Klebsiella aerogenes NOT DETECTED NOT  DETECTED Final   Klebsiella oxytoca NOT DETECTED NOT DETECTED Final   Klebsiella pneumoniae NOT DETECTED NOT DETECTED Final   Proteus species NOT DETECTED NOT DETECTED Final   Salmonella  species NOT DETECTED NOT DETECTED Final   Serratia marcescens NOT DETECTED NOT DETECTED Final   Haemophilus influenzae NOT DETECTED NOT DETECTED Final   Neisseria meningitidis NOT DETECTED NOT DETECTED Final   Pseudomonas aeruginosa NOT DETECTED NOT DETECTED Final   Stenotrophomonas maltophilia NOT DETECTED NOT DETECTED Final   Candida albicans NOT DETECTED NOT DETECTED Final   Candida auris NOT DETECTED NOT DETECTED Final   Candida glabrata NOT DETECTED NOT DETECTED Final   Candida krusei NOT DETECTED NOT DETECTED Final   Candida parapsilosis NOT DETECTED NOT DETECTED Final   Candida tropicalis NOT DETECTED NOT DETECTED Final   Cryptococcus neoformans/gattii NOT DETECTED NOT DETECTED Final   CTX-M ESBL NOT DETECTED NOT DETECTED Final   Carbapenem resistance IMP NOT DETECTED NOT DETECTED Final   Carbapenem resistance KPC NOT DETECTED NOT DETECTED Final   Carbapenem resistance NDM NOT DETECTED NOT DETECTED Final   Carbapenem resist OXA 48 LIKE NOT DETECTED NOT DETECTED Final   Carbapenem resistance VIM NOT DETECTED NOT DETECTED Final    Comment: Performed at [facility], [address], [place], [place] [address]  SARS CORONAVIRUS 2 (TAT 6-24 HRS) Nasopharyngeal Nasopharyngeal Swab     Status: None   Collection Time: 03/28/21  2:47 PM   Specimen: Nasopharyngeal Swab  Result Value Ref Range Status   SARS Coronavirus 2 NEGATIVE NEGATIVE Final    Comment: (NOTE) SARS-CoV-2 target nucleic acids are NOT DETECTED.  The SARS-CoV-2 RNA is generally detectable in upper and lower respiratory specimens during the acute phase of infection. Negative results do not preclude SARS-CoV-2 infection, do not rule out co-infections with other pathogens, and should not be used as the sole basis for treatment or other patient management decisions. Negative results must be combined with clinical observations, patient history, and epidemiological information. The expected result is Negative.  Fact  Sheet for Patients: HairSlick.no  Fact Sheet for Healthcare Providers: quierodirigir.com  This test is not yet approved or cleared by the [place] FDA and  has been authorized for detection and/or diagnosis of SARS-CoV-2 by FDA under an Emergency Use Authorization (EUA). This EUA will remain  in effect (meaning this test can be used) for the duration of the COVID-19 declaration under Se ction 564(b)(1) of the Act, 21 U.S.C. section 360bbb-3(b)(1), unless the authorization is terminated or revoked sooner.  Performed at [facility], [address], [place], [place] [address]   MRSA Next Gen by PCR, Nasal     Status: None   Collection Time: 03/28/21  5:01 PM   Specimen: Nasal Mucosa; Nasal Swab  Result Value Ref Range Status   MRSA by PCR Next Gen NOT DETECTED NOT DETECTED Final    Comment: (NOTE) The GeneXpert MRSA Assay (FDA approved for NASAL specimens only), is one component of a comprehensive MRSA colonization surveillance program. It is not intended to diagnose MRSA infection nor to guide or monitor treatment for MRSA infections. Test performance is not FDA approved in patients less than 32 years old. Performed at [facility], [address], [place], [place] [address]   Urine Culture     Status: Abnormal   Collection Time: 03/28/21  9:49 PM   Specimen: Urine, Catheterized  Result Value Ref Range Status   Specimen Description   Final  URINE, CATHETERIZED Performed at [facility], [address] [place], [place] [address]    Special Requests   Final    NONE Performed at [facility], [address], [place], [place] [address]    Culture MULTIPLE SPECIES PRESENT, SUGGEST RECOLLECTION (A)  Final   Report Status 03/30/2021 FINAL  Final      Radiology Studies: DG Chest 2 View  Result Date: 03/28/2021 CLINICAL DATA:  Suspected sepsis, fever  EXAM: CHEST - 2 VIEW COMPARISON:  03/18/2021 FINDINGS: Cardiomegaly. Both lungs are clear. Disc degenerative disease of the thoracic spine. IMPRESSION: Cardiomegaly without acute abnormality of the lungs. Electronically Signed   By: Lauralyn Primes M.D.   On: 03/28/2021 14:58   CT HEAD WO CONTRAST ( )  Result Date: 03/29/2021 CLINICAL DATA:  Mental status change, unknown cause. Fever, sepsis, and confusion. EXAM: CT HEAD WITHOUT CONTRAST TECHNIQUE: Contiguous axial images were obtained from the base of the skull through the vertex without intravenous contrast. COMPARISON:  Head CT and MRI 03/17/2021 FINDINGS: Brain: There is no evidence of an acute infarct, intracranial hemorrhage, mass, midline shift, or extra-axial fluid collection. Slight cerebral atrophy and minimal chronic small vessel ischemia in the periventricular white matter are again noted. Vascular: Calcified atherosclerosis at the skull base. No hyperdense vessel. Skull: No fracture or suspicious osseous lesion. Sinuses/Orbits: Visualized paranasal sinuses and mastoid air cells are clear. Unremarkable orbits. Other: None. IMPRESSION: No evidence of acute intracranial abnormality. Electronically Signed   By: Sebastian Ache M.D.   On: 03/29/2021 13:07   [place] EKG SITE RITE  Result Date: 03/29/2021 If Site Rite image not attached, placement could not be confirmed due to current cardiac rhythm.   Scheduled Meds:  amLODipine  5 mg Oral Daily   Chlorhexidine Gluconate Cloth  6 each Topical Daily   enalaprilat  0.625 mg Intravenous Once   enoxaparin (LOVENOX) injection  40 mg Subcutaneous Q24H   mouth rinse  15 mL Mouth Rinse BID   metoprolol tartrate  10 mg Intravenous Q6H   pantoprazole (PROTONIX) IV  40 mg Intravenous Q24H   sodium chloride flush  10-40 mL Intracatheter Q12H   sodium chloride flush  3 mL Intravenous Q12H   Continuous Infusions:  sodium chloride 250 mL (03/30/21 1213)   sodium chloride 75 mL/hr at 03/30/21 1200   ceFEPime  (MAXIPIME) IV Stopped (03/30/21 0538)     LOS: 2 days   Rickey Barbara, MD [facility] Pager On Amion  If 7PM-7AM, please contact night-coverage 03/30/2021, 12:50 PM

## 2021-03-31 LAB — COMPREHENSIVE METABOLIC PANEL
ALT: 18 U/L (ref 0–44)
AST: 18 U/L (ref 15–41)
Albumin: 2.7 g/dL — ABNORMAL LOW (ref 3.5–5.0)
Alkaline Phosphatase: 78 U/L (ref 38–126)
Anion gap: 12 (ref 5–15)
BUN: 20 mg/dL (ref 8–23)
CO2: 26 mmol/L (ref 22–32)
Calcium: 8.2 mg/dL — ABNORMAL LOW (ref 8.9–10.3)
Chloride: 106 mmol/L (ref 98–111)
Creatinine, Ser: 0.49 mg/dL (ref 0.44–1.00)
GFR, Estimated: >60 mL/min (ref >60)
Glucose, Bld: 97 mg/dL (ref 70–99)
Potassium: 2.7 mmol/L — CL (ref 3.5–5.1)
Sodium: 144 mmol/L (ref 135–145)
Total Bilirubin: 0.9 mg/dL (ref 0.3–1.2)
Total Protein: 6.9 g/dL (ref 6.5–8.1)

## 2021-03-31 LAB — CBC
HCT: 38 % (ref 36.0–46.0)
Hemoglobin: 12 g/dL (ref 12.0–15.0)
MCH: 31.7 pg (ref 26.0–34.0)
MCHC: 31.6 g/dL (ref 30.0–36.0)
MCV: 100.5 fL — ABNORMAL HIGH (ref 80.0–100.0)
Platelets: 275 10*3/uL (ref 150–400)
RBC: 3.78 MIL/uL — ABNORMAL LOW (ref 3.87–5.11)
RDW: 15.1 % (ref 11.5–15.5)
WBC: 12.1 10*3/uL — ABNORMAL HIGH (ref 4.0–10.5)
nRBC: 0 % (ref 0.0–0.2)

## 2021-03-31 MED ORDER — POTASSIUM CHLORIDE 10 MEQ/50ML IV SOLN
INTRAVENOUS | Status: AC
  Filled 2021-03-31: qty 250

## 2021-03-31 MED ORDER — METOPROLOL TARTRATE 25 MG PO TABS
50.0000 mg | ORAL_TABLET | Freq: Two times a day (BID) | ORAL | Status: DC
  Administered 2021-04-01 – 2021-04-02 (×3): 50 mg via ORAL
  Filled 2021-03-31 (×3): qty 2

## 2021-03-31 MED ORDER — LISINOPRIL 20 MG PO TABS
20.0000 mg | ORAL_TABLET | Freq: Every day | ORAL | Status: DC
  Administered 2021-03-31 – 2021-04-05 (×6): 20 mg via ORAL
  Filled 2021-03-31: qty 2
  Filled 2021-03-31: qty 1
  Filled 2021-03-31: qty 2
  Filled 2021-03-31: qty 1
  Filled 2021-03-31: qty 2
  Filled 2021-03-31: qty 1

## 2021-03-31 MED ORDER — POTASSIUM CHLORIDE 10 MEQ/50ML IV SOLN
10.0000 meq | INTRAVENOUS | Status: AC
Start: 2021-03-31 — End: 2021-03-31
  Administered 2021-03-31 (×6): 10 meq via INTRAVENOUS
  Filled 2021-03-31: qty 50

## 2021-03-31 MED ORDER — CLONAZEPAM 0.5 MG PO TABS
0.2500 mg | ORAL_TABLET | Freq: Three times a day (TID) | ORAL | Status: DC | PRN
  Administered 2021-03-31 – 2021-04-02 (×4): 0.25 mg via ORAL
  Filled 2021-03-31 (×4): qty 1

## 2021-03-31 MED ORDER — AMLODIPINE BESYLATE 5 MG PO TABS
5.0000 mg | ORAL_TABLET | Freq: Once | ORAL | Status: AC
  Administered 2021-03-31: 5 mg via ORAL
  Filled 2021-03-31: qty 1

## 2021-03-31 MED ORDER — AMLODIPINE BESYLATE 10 MG PO TABS
10.0000 mg | ORAL_TABLET | Freq: Every day | ORAL | Status: DC
  Administered 2021-04-01 – 2021-04-05 (×5): 10 mg via ORAL
  Filled 2021-03-31 (×5): qty 1

## 2021-03-31 NOTE — Progress Notes (Signed)
PROGRESS NOTE    Stephanie Carrillo  KNL:[id number] DOB: [birth year] DOA: 03/28/2021 PCP: Lavada Mesi, MD    Brief Narrative:  65 y.o. female with medical history significant of chronic pain, polypharmacy, and HTN comes in with AMS. Pt was recently discharged home for AMS with UTI resistant to bactrim  Assessment & Plan:   Active Problems:   Anxiety and depression   Chronic pain syndrome   Acute encephalopathy   Hypertensive urgency   Sepsis (HCC)    Sepsis due to suspected UTI with ecoli bacteremia -Recent urine cx pos for ecoli resistant to bactrim, failed course of bactrim prior to this admit -currently on cefepime, per urine cx results from last UTI -Pt with ecoli in blood, sensitivities are pending    HTN urgency -very poorly controlled prior to visit -Blood pressure initially improved with beta-blocker, now blood pressure suboptimally controlled today -We will maximize Norvasc dose to 10 mg, would transition to oral metoprolol, will add scheduled lisinopril 20 mg   Acute metabolic encephalopathy -similar presentation in past that improved with IV abx -Also consideration for polypharmacy noted -Mentation and level of alertness seem improved today   Chronic pain -Patient had complained of generalized pain recently -Have resumed sublingual Suboxone per home regimen   Anxiety/depression -Patient has been on daily clonazepam prior to admission -Will resume clonazepam albeit at lower dose at 0.25 mg 3 times daily as needed  DVT prophylaxis: Lovenox subq Code Status: DNR Family Communication: Pt in room, family is at bedside  Status is: Inpatient  Remains inpatient appropriate because:Inpatient level of care appropriate due to severity of illness  Dispo: The patient is from: Home              Anticipated d/c is to: Home              Patient currently is not medically stable to d/c.   Difficult to place patient No    Consultants:    Procedures:     Antimicrobials: Anti-infectives (From admission, onward)    Start     Dose/Rate Route Frequency Ordered Stop   03/28/21 2200  ceFEPIme (MAXIPIME) 2 g in sodium chloride 0.9 % 100 mL IVPB        2 g 200 mL/hr over 30 Minutes Intravenous Every 8 hours 03/28/21 1559     03/28/21 1430  aztreonam (AZACTAM) 2 g in sodium chloride 0.9 % 100 mL IVPB  Status:  Discontinued        2 g 200 mL/hr over 30 Minutes Intravenous  Once 03/28/21 1418 03/28/21 1427   03/28/21 1430  metroNIDAZOLE (FLAGYL) IVPB 500 mg        500 mg 100 mL/hr over 60 Minutes Intravenous  Once 03/28/21 1418 03/28/21 1637   03/28/21 1430  vancomycin (VANCOCIN) IVPB 1000 mg/200 mL premix        1,000 mg 200 mL/hr over 60 Minutes Intravenous  Once 03/28/21 1418 03/28/21 1706   03/28/21 1430  ceFEPIme (MAXIPIME) 2 g in sodium chloride 0.9 % 100 mL IVPB        2 g 200 mL/hr over 30 Minutes Intravenous  Once 03/28/21 1427 03/28/21 1530       Subjective: Reports feeling better today  Objective: Vitals:   03/31/21 0200 03/31/21 0258 03/31/21 0854 03/31/21 1342  BP: (!) 177/93     Pulse: (!) 101     Resp: 14     Temp:  99 F (37.2 C) 98.4 F (36.9 C) 98.6 F (  37 C)  TempSrc:  Oral Oral Oral  SpO2: 96%     Weight:      Height:        Intake/Output Summary (Last 24 hours) at 03/31/2021 1401 Last data filed at 03/31/2021 0631 Gross per 24 hour  Intake 1586.94 ml  Output 750 ml  Net 836.94 ml    Filed Weights   03/28/21 1303 03/28/21 1700  Weight: 86.8 kg 66.3 kg    Examination: General exam: Conversant, in no acute distress Respiratory system: normal chest rise, clear, no audible wheezing Cardiovascular system: regular rhythm, s1-s2 Gastrointestinal system: Nondistended, nontender, pos BS Central nervous system: No seizures, no tremors Extremities: No cyanosis, no joint deformities Skin: No rashes, no pallor Psychiatry: Affect normal // no auditory hallucinations   Data Reviewed: I have personally  reviewed following labs and imaging studies  CBC: Recent Labs  Lab 03/28/21 1255 03/29/21 0256 03/29/21 1629 03/30/21 0431 03/31/21 0527  WBC 15.8* 18.4*  --  19.5* 12.1*  NEUTROABS 13.9*  --   --   --   --   HGB 13.3 13.8 12.8 13.5 12.0  HCT 40.6 41.9 38.6 41.9 38.0  MCV 96.9 97.0  --  97.9 100.5*  PLT 344 342  --  336 275    Basic Metabolic Panel: Recent Labs  Lab 03/28/21 1255 03/29/21 0256 03/30/21 0431 03/31/21 0527  NA 140 137 139 144  K 3.7 2.7* 3.5 2.7*  CL 107 98 99 106  CO2 22 26 28 26   GLUCOSE 114* 124* 126* 97  BUN 17 11 19 20   CREATININE 0.78 0.62 0.72 0.49  CALCIUM 9.4 9.2 9.3 8.2*  MG  --  1.6* 2.2  --     GFR: Estimated Creatinine Clearance: 68.2 mL/min (by C-G formula based on SCr of 0.49 mg/dL). Liver Function Tests: Recent Labs  Lab 03/28/21 1255 03/29/21 0256 03/30/21 0431 03/31/21 0527  AST 18 23 24 18   ALT 23 23 21 18   ALKPHOS 102 112 103 78  BILITOT 0.5 0.8 0.7 0.9  PROT 8.3* 8.3* 8.0 6.9  ALBUMIN 4.0 3.7 3.2* 2.7*    No results for input(s): LIPASE, AMYLASE in the last 168 hours. No results for input(s): AMMONIA in the last 168 hours. Coagulation Profile: Recent Labs  Lab 03/28/21 1255  INR 1.0    Cardiac Enzymes: No results for input(s): CKTOTAL, CKMB, CKMBINDEX, TROPONINI in the last 168 hours. BNP (last 3 results) No results for input(s): PROBNP in the last 8760 hours. HbA1C: No results for input(s): HGBA1C in the last 72 hours. CBG: No results for input(s): GLUCAP in the last 168 hours. Lipid Profile: No results for input(s): CHOL, HDL, LDLCALC, TRIG, CHOLHDL, LDLDIRECT in the last 72 hours. Thyroid Function Tests: No results for input(s): TSH, T4TOTAL, FREET4, T3FREE, THYROIDAB in the last 72 hours. Anemia Panel: No results for input(s): VITAMINB12, FOLATE, FERRITIN, TIBC, IRON, RETICCTPCT in the last 72 hours. Sepsis Labs: Recent Labs  Lab 03/28/21 1255 03/28/21 1712  LATICACIDVEN 1.0 1.2    Recent  Results (from the past 240 hour(s))  Culture, blood (Routine x 2)     Status: None (Preliminary result)   Collection Time: 03/28/21 12:55 PM   Specimen: BLOOD RIGHT WRIST  Result Value Ref Range Status   Specimen Description   Final    BLOOD RIGHT WRIST Performed at [facility], [address] [place], [place] [id number]    Special Requests   Final    BOTTLES DRAWN  AEROBIC AND ANAEROBIC Blood Culture adequate volume Performed at [facility], [address], [place], [place] [address]    Culture   Final    NO GROWTH 2 DAYS Performed at [facility], [address], [place], [place] [address]    Report Status PENDING  Incomplete  Culture, blood (Routine x 2)     Status: Abnormal (Preliminary result)   Collection Time: 03/28/21  1:00 PM   Specimen: BLOOD  Result Value Ref Range Status   Specimen Description   Final    BLOOD LEFT ARM Performed at [facility], [address], [place], [place] [address]    Special Requests   Final    BOTTLES DRAWN AEROBIC AND ANAEROBIC Blood Culture adequate volume Performed at [facility], [address] [place], [place] [address]    Culture  Setup Time   Final    GRAM NEGATIVE RODS IN BOTH AEROBIC AND ANAEROBIC BOTTLES CRITICAL RESULT CALLED TO, READ BACK BY AND VERIFIED WITH: PHARMD JUSTIN LEGGE  03/29/21 @0949  BY JW    Culture (A)  Final    ESCHERICHIA COLI SUSCEPTIBILITIES TO FOLLOW Performed at [facility], [address], [place], [place]    Report Status PENDING  Incomplete  Blood Culture ID Panel (Reflexed)     Status: Abnormal   Collection Time: 03/28/21  1:00 PM  Result Value Ref Range Status   Enterococcus faecalis NOT DETECTED NOT DETECTED Final   Enterococcus Faecium NOT DETECTED NOT DETECTED Final   Listeria monocytogenes NOT DETECTED NOT DETECTED Final   Staphylococcus species NOT DETECTED NOT DETECTED Final   Staphylococcus aureus  (BCID) NOT DETECTED NOT DETECTED Final   Staphylococcus epidermidis NOT DETECTED NOT DETECTED Final   Staphylococcus lugdunensis NOT DETECTED NOT DETECTED Final   Streptococcus species NOT DETECTED NOT DETECTED Final   Streptococcus agalactiae NOT DETECTED NOT DETECTED Final   Streptococcus pneumoniae NOT DETECTED NOT DETECTED Final   Streptococcus pyogenes NOT DETECTED NOT DETECTED Final   A.calcoaceticus-baumannii NOT DETECTED NOT DETECTED Final   Bacteroides fragilis NOT DETECTED NOT DETECTED Final   Enterobacterales DETECTED (A) NOT DETECTED Final    Comment: Enterobacterales represent a large order of gram negative bacteria, not a single organism. CRITICAL RESULT CALLED TO, READ BACK BY AND VERIFIED WITH: PHARMD JUSTIN LEGG 03/29/21 @0951  BY JW    Enterobacter cloacae complex NOT DETECTED NOT DETECTED Final   Escherichia coli DETECTED (A) NOT DETECTED Final    Comment: CRITICAL RESULT CALLED TO, READ BACK BY AND VERIFIED WITH: PHARMD JUSTIN LEGG 03/29/21 @0951  BY JW    Klebsiella aerogenes NOT DETECTED NOT DETECTED Final   Klebsiella oxytoca NOT DETECTED NOT DETECTED Final   Klebsiella pneumoniae NOT DETECTED NOT DETECTED Final   Proteus species NOT DETECTED NOT DETECTED Final   Salmonella species NOT DETECTED NOT DETECTED Final   Serratia marcescens NOT DETECTED NOT DETECTED Final   Haemophilus influenzae NOT DETECTED NOT DETECTED Final   Neisseria meningitidis NOT DETECTED NOT DETECTED Final   Pseudomonas aeruginosa NOT DETECTED NOT DETECTED Final   Stenotrophomonas maltophilia NOT DETECTED NOT DETECTED Final   Candida albicans NOT DETECTED NOT DETECTED Final   Candida auris NOT DETECTED NOT DETECTED Final   Candida glabrata NOT DETECTED NOT DETECTED Final   Candida krusei NOT DETECTED NOT DETECTED Final   Candida parapsilosis NOT DETECTED NOT DETECTED Final   Candida tropicalis NOT DETECTED NOT DETECTED Final   Cryptococcus neoformans/gattii NOT DETECTED  NOT DETECTED Final    CTX-M ESBL NOT DETECTED NOT DETECTED Final   Carbapenem resistance IMP NOT DETECTED NOT DETECTED Final   Carbapenem resistance KPC NOT DETECTED NOT DETECTED Final   Carbapenem resistance NDM NOT DETECTED NOT DETECTED Final   Carbapenem resist OXA 48 LIKE NOT DETECTED NOT DETECTED Final   Carbapenem resistance VIM NOT DETECTED NOT DETECTED Final    Comment: Performed at [facility], [address], [place], [place] [address]  SARS CORONAVIRUS 2 (TAT 6-24 HRS) Nasopharyngeal Nasopharyngeal Swab     Status: None   Collection Time: 03/28/21  2:47 PM   Specimen: Nasopharyngeal Swab  Result Value Ref Range Status   SARS Coronavirus 2 NEGATIVE NEGATIVE Final    Comment: (NOTE) SARS-CoV-2 target nucleic acids are NOT DETECTED.  The SARS-CoV-2 RNA is generally detectable in upper and lower respiratory specimens during the acute phase of infection. Negative results do not preclude SARS-CoV-2 infection, do not rule out co-infections with other pathogens, and should not be used as the sole basis for treatment or other patient management decisions. Negative results must be combined with clinical observations, patient history, and epidemiological information. The expected result is Negative.  Fact Sheet for Patients: HairSlick.no  Fact Sheet for Healthcare Providers: quierodirigir.com  This test is not yet approved or cleared by the [place] FDA and  has been authorized for detection and/or diagnosis of SARS-CoV-2 by FDA under an Emergency Use Authorization (EUA). This EUA will remain  in effect (meaning this test can be used) for the duration of the COVID-19 declaration under Se ction 564(b)(1) of the Act, 21 U.S.C. section 360bbb-3(b)(1), unless the authorization is terminated or revoked sooner.  Performed at [facility], [address], [place], [place] [address]   MRSA Next Gen by PCR, Nasal     Status: None    Collection Time: 03/28/21  5:01 PM   Specimen: Nasal Mucosa; Nasal Swab  Result Value Ref Range Status   MRSA by PCR Next Gen NOT DETECTED NOT DETECTED Final    Comment: (NOTE) The GeneXpert MRSA Assay (FDA approved for NASAL specimens only), is one component of a comprehensive MRSA colonization surveillance program. It is not intended to diagnose MRSA infection nor to guide or monitor treatment for MRSA infections. Test performance is not FDA approved in patients less than 24 years old. Performed at [facility], [address] [place], [place] [address]   Urine Culture     Status: Abnormal   Collection Time: 03/28/21  9:49 PM   Specimen: Urine, Catheterized  Result Value Ref Range Status   Specimen Description   Final    URINE, CATHETERIZED Performed at [facility], [address], [place], [place] [address]    Special Requests   Final    NONE Performed at [facility], [address] [place], [place] [address]    Culture MULTIPLE SPECIES PRESENT, SUGGEST RECOLLECTION (A)  Final   Report Status 03/30/2021 FINAL  Final      Radiology Studies: No results found.  Scheduled Meds:  amLODipine  5 mg Oral Daily   buprenorphine  2 mg Sublingual Q6H   Chlorhexidine Gluconate Cloth  6 each Topical Daily   enalaprilat  0.625 mg Intravenous Once   enoxaparin (LOVENOX) injection  40 mg Subcutaneous Q24H   mouth rinse  15 mL Mouth Rinse BID   metoprolol tartrate  10 mg Intravenous Q6H   pantoprazole (PROTONIX) IV  40 mg Intravenous Q24H   sodium chloride flush  10-40 mL Intracatheter Q12H   sodium chloride flush  3 mL Intravenous Q12H   Continuous Infusions:  sodium chloride 10 mL/hr at 03/31/21 0631   sodium chloride 75 mL/hr at 03/31/21 0631   ceFEPime (MAXIPIME) IV Stopped (03/31/21 0601)     LOS: 3 days   Rickey Barbara, MD [facility] Pager On Amion  If 7PM-7AM, please contact night-coverage 03/31/2021,  2:01 PM

## 2021-03-31 NOTE — Progress Notes (Signed)
Date and time results received: 03/31/21 @0638    Test: POTASSIUM Critical Value: 2.7  Name of Provider Notified: Blount NP  Orders Received? Or Actions Taken?:  AWAITING RESPONSE

## 2021-04-01 ENCOUNTER — Other Ambulatory Visit: Payer: Medicare Other

## 2021-04-01 DIAGNOSIS — G9340 Encephalopathy, unspecified: Secondary | ICD-10-CM

## 2021-04-01 DIAGNOSIS — F32A Depression, unspecified: Secondary | ICD-10-CM

## 2021-04-01 DIAGNOSIS — R7881 Bacteremia: Secondary | ICD-10-CM

## 2021-04-01 DIAGNOSIS — G894 Chronic pain syndrome: Secondary | ICD-10-CM | POA: Diagnosis not present

## 2021-04-01 DIAGNOSIS — F419 Anxiety disorder, unspecified: Secondary | ICD-10-CM

## 2021-04-01 DIAGNOSIS — A4151 Sepsis due to Escherichia coli [E. coli]: Principal | ICD-10-CM

## 2021-04-01 DIAGNOSIS — F1129 Opioid dependence with unspecified opioid-induced disorder: Secondary | ICD-10-CM

## 2021-04-01 DIAGNOSIS — R6520 Severe sepsis without septic shock: Secondary | ICD-10-CM

## 2021-04-01 DIAGNOSIS — G9341 Metabolic encephalopathy: Secondary | ICD-10-CM

## 2021-04-01 DIAGNOSIS — B9620 Unspecified Escherichia coli [E. coli] as the cause of diseases classified elsewhere: Secondary | ICD-10-CM

## 2021-04-01 LAB — CULTURE, BLOOD (ROUTINE X 2): Special Requests: ADEQUATE

## 2021-04-01 LAB — MAGNESIUM: Magnesium: 1.5 mg/dL — ABNORMAL LOW (ref 1.7–2.4)

## 2021-04-01 LAB — COMPREHENSIVE METABOLIC PANEL
ALT: 21 U/L (ref 0–44)
AST: 21 U/L (ref 15–41)
Albumin: 2.4 g/dL — ABNORMAL LOW (ref 3.5–5.0)
Alkaline Phosphatase: 62 U/L (ref 38–126)
Anion gap: 8 (ref 5–15)
BUN: 15 mg/dL (ref 8–23)
CO2: 28 mmol/L (ref 22–32)
Calcium: 7.7 mg/dL — ABNORMAL LOW (ref 8.9–10.3)
Chloride: 101 mmol/L (ref 98–111)
Creatinine, Ser: 0.39 mg/dL — ABNORMAL LOW (ref 0.44–1.00)
GFR, Estimated: >60 mL/min (ref >60)
Glucose, Bld: 85 mg/dL (ref 70–99)
Potassium: 2.9 mmol/L — ABNORMAL LOW (ref 3.5–5.1)
Sodium: 137 mmol/L (ref 135–145)
Total Bilirubin: 0.7 mg/dL (ref 0.3–1.2)
Total Protein: 6 g/dL — ABNORMAL LOW (ref 6.5–8.1)

## 2021-04-01 LAB — HEPATITIS B SURFACE ANTIGEN: Hepatitis B Surface Ag: NONREACTIVE

## 2021-04-01 LAB — HEPATITIS C ANTIBODY: HCV Ab: NONREACTIVE

## 2021-04-01 LAB — HEPATITIS A ANTIBODY, TOTAL: hep A Total Ab: REACTIVE — AB

## 2021-04-01 MED ORDER — CEFADROXIL 500 MG PO CAPS
1000.0000 mg | ORAL_CAPSULE | Freq: Two times a day (BID) | ORAL | Status: AC
  Administered 2021-04-01 – 2021-04-03 (×6): 1000 mg via ORAL
  Filled 2021-04-01 (×8): qty 2

## 2021-04-01 MED ORDER — SODIUM CHLORIDE 0.9 % IV SOLN
2.0000 g | Freq: Every day | INTRAVENOUS | Status: DC
  Filled 2021-04-01: qty 20

## 2021-04-01 MED ORDER — POTASSIUM CHLORIDE CRYS ER 20 MEQ PO TBCR
60.0000 meq | EXTENDED_RELEASE_TABLET | Freq: Four times a day (QID) | ORAL | Status: AC
Start: 2021-04-01 — End: 2021-04-01
  Administered 2021-04-01 (×2): 60 meq via ORAL
  Filled 2021-04-01 (×2): qty 3

## 2021-04-01 MED ORDER — MAGNESIUM SULFATE 4 GM/100ML IV SOLN
4.0000 g | Freq: Once | INTRAVENOUS | Status: AC
  Administered 2021-04-01: 4 g via INTRAVENOUS
  Filled 2021-04-01: qty 100

## 2021-04-01 NOTE — TOC Progression Note (Signed)
Transition of Care [facility]) - Progression Note    Patient Details  Name: Stephanie Carrillo MRN: [id number] Date of Birth: [birth year]  [facility] CM/SW Contact  Golda Acre, RN Phone Number: 04/01/2021, 8:43 AM  Clinical Narrative:    65 y.o. female with medical history significant of chronic pain, polypharmacy, and HTN comes in with AMS. Pt was recently discharged home for AMS with UTI resistant to bactrim   Assessment & Plan:   Active Problems:   Anxiety and depression   Chronic pain syndrome   Acute encephalopathy   Hypertensive urgency   Sepsis (HCC)     Sepsis due to suspected UTI with ecoli bacteremia -Recent urine cx pos for ecoli resistant to bactrim, failed course of bactrim prior to this admit -currently on cefepime, per urine cx results from last UTI -Pt with ecoli in blood, sensitivities are pending    HTN urgency -very poorly controlled prior to visit -Blood pressure initially improved with beta-blocker, now blood pressure suboptimally controlled today -We will maximize Norvasc dose to 10 mg, would transition to oral metoprolol, will add scheduled lisinopril 20 mg   Acute metabolic encephalopathy -similar presentation in past that improved with IV abx -Also consideration for polypharmacy noted -Mentation and level of alertness seem improved today   Chronic pain -Patient had complained of generalized pain recently -Have resumed sublingual Suboxone per home regimen   Anxiety/depression -Patient has been on daily clonazepam prior to admission -Will resume clonazepam albeit at lower dose at 0.25 mg 3 times daily as needed  TOC PLAN  OF CARE: to return home with self care. Bld cultures x2 positive for E. Coli. Iv maxipime. Progression : following for changes and improvements in condition.   Expected Discharge Plan: Home/Self Care Barriers to Discharge: Continued Medical Work up  Expected Discharge Plan and Services Expected Discharge Plan:  Home/Self Care   Discharge Planning Services: CM Consult   Living arrangements for the past 2 months: Apartment Expected Discharge Date:  (unknown)                                     Social Determinants of Health (SDOH) Interventions    Readmission Risk Interventions No flowsheet data found.

## 2021-04-01 NOTE — Progress Notes (Signed)
PROGRESS NOTE    Stephanie Carrillo  TYO:[id number] DOB: 1955-09-01 DOA: 03/28/2021 PCP: Lavada Mesi, MD    Brief Narrative:  65 y.o. female with medical history significant of chronic pain, polypharmacy, and HTN comes in with AMS. Pt was recently discharged home for AMS with UTI resistant to bactrim  Assessment & Plan:   Active Problems:   Anxiety and depression   Chronic pain syndrome   Acute encephalopathy   Hypertensive urgency   Sepsis (HCC)    Sepsis due to suspected UTI with ecoli bacteremia -Recent urine cx pos for ecoli resistant to bactrim, failed course of bactrim prior to this admit -Patient had been on cefepime, per urine cx results from last UTI -E. coli cultures are now notable for E. coli resistant to ciprofloxacin and Bactrim, antibiotics changed to Rocephin this morning -Given gram-negative bacteremia, infectious disease consulted as well    HTN urgency -very poorly controlled prior to visit -Now on Norvasc dose to 10 mg, metoprolol 50 mg p.o. twice daily, scheduled lisinopril 20 mg   Acute metabolic encephalopathy -similar presentation in past that improved with IV abx -Also consideration for polypharmacy noted -Mentation and level of alertness seem improved today   Chronic pain -Patient had complained of generalized pain recently -Have resumed sublingual Suboxone per home regimen   Anxiety/depression -Patient has been on daily clonazepam prior to admission -Have resumed clonazepam albeit at lower dose at 0.25 mg 3 times daily as needed  DVT prophylaxis: Lovenox subq Code Status: DNR Family Communication: Pt in room, family is at bedside  Status is: Inpatient  Remains inpatient appropriate because:Inpatient level of care appropriate due to severity of illness  Dispo: The patient is from: Home              Anticipated d/c is to: Home              Patient currently is not medically stable to d/c.   Difficult to place patient No    Consultants:     Procedures:    Antimicrobials: Anti-infectives (From admission, onward)    Start     Dose/Rate Route Frequency Ordered Stop   04/01/21 1400  cefTRIAXone (ROCEPHIN) 2 g in sodium chloride 0.9 % 100 mL IVPB        2 g 200 mL/hr over 30 Minutes Intravenous Daily 04/01/21 0856     03/28/21 2200  ceFEPIme (MAXIPIME) 2 g in sodium chloride 0.9 % 100 mL IVPB  Status:  Discontinued        2 g 200 mL/hr over 30 Minutes Intravenous Every 8 hours 03/28/21 1559 04/01/21 0855   03/28/21 1430  aztreonam (AZACTAM) 2 g in sodium chloride 0.9 % 100 mL IVPB  Status:  Discontinued        2 g 200 mL/hr over 30 Minutes Intravenous  Once 03/28/21 1418 03/28/21 1427   03/28/21 1430  metroNIDAZOLE (FLAGYL) IVPB 500 mg        500 mg 100 mL/hr over 60 Minutes Intravenous  Once 03/28/21 1418 03/28/21 1637   03/28/21 1430  vancomycin (VANCOCIN) IVPB 1000 mg/200 mL premix        1,000 mg 200 mL/hr over 60 Minutes Intravenous  Once 03/28/21 1418 03/28/21 1706   03/28/21 1430  ceFEPIme (MAXIPIME) 2 g in sodium chloride 0.9 % 100 mL IVPB        2 g 200 mL/hr over 30 Minutes Intravenous  Once 03/28/21 1427 03/28/21 1530       Subjective: States  feeling much better today, questioning about going home  Objective: Vitals:   04/01/21 0800 04/01/21 0900 04/01/21 0924 04/01/21 1026  BP: (!) 141/74 (!) 168/81 (!) 168/81 (!) 186/122  Pulse: 85 95  93  Resp: 11 11  14   Temp: 98.3 F (36.8 C)     TempSrc: Axillary     SpO2: 97% 97%  97%  Weight:      Height:        Intake/Output Summary (Last 24 hours) at 04/01/2021 1134 Last data filed at 04/01/2021 0600 Gross per 24 hour  Intake 1901.81 ml  Output 2050 ml  Net -148.19 ml    Filed Weights   03/28/21 1303 03/28/21 1700  Weight: 86.8 kg 66.3 kg    Examination: General exam: Awake, laying in bed, in nad Respiratory system: Normal respiratory effort, no wheezing Cardiovascular system: regular rate, s1, s2 Gastrointestinal system: Soft,  nondistended, positive BS Central nervous system: CN2-12 grossly intact, strength intact Extremities: Perfused, no clubbing Skin: Normal skin turgor, no notable skin lesions seen Psychiatry: Mood normal // no visual hallucinations   Data Reviewed: I have personally reviewed following labs and imaging studies  CBC: Recent Labs  Lab 03/28/21 1255 03/29/21 0256 03/29/21 1629 03/30/21 0431 03/31/21 0527  WBC 15.8* 18.4*  --  19.5* 12.1*  NEUTROABS 13.9*  --   --   --   --   HGB 13.3 13.8 12.8 13.5 12.0  HCT 40.6 41.9 38.6 41.9 38.0  MCV 96.9 97.0  --  97.9 100.5*  PLT 344 342  --  336 275    Basic Metabolic Panel: Recent Labs  Lab 03/28/21 1255 03/29/21 0256 03/30/21 0431 03/31/21 0527 04/01/21 0459  NA 140 137 139 144 137  K 3.7 2.7* 3.5 2.7* 2.9*  CL 107 98 99 106 101  CO2 22 26 28 26 28   GLUCOSE 114* 124* 126* 97 85  BUN 17 11 19 20 15   CREATININE 0.78 0.62 0.72 0.49 0.39*  CALCIUM 9.4 9.2 9.3 8.2* 7.7*  MG  --  1.6* 2.2  --  1.5*    GFR: Estimated Creatinine Clearance: 68.2 mL/min (A) (by C-G formula based on SCr of 0.39 mg/dL (L)). Liver Function Tests: Recent Labs  Lab 03/28/21 1255 03/29/21 0256 03/30/21 0431 03/31/21 0527 04/01/21 0459  AST 18 23 24 18 21   ALT 23 23 21 18 21   ALKPHOS 102 112 103 78 62  BILITOT 0.5 0.8 0.7 0.9 0.7  PROT 8.3* 8.3* 8.0 6.9 6.0*  ALBUMIN 4.0 3.7 3.2* 2.7* 2.4*    No results for input(s): LIPASE, AMYLASE in the last 168 hours. No results for input(s): AMMONIA in the last 168 hours. Coagulation Profile: Recent Labs  Lab 03/28/21 1255  INR 1.0    Cardiac Enzymes: No results for input(s): CKTOTAL, CKMB, CKMBINDEX, TROPONINI in the last 168 hours. BNP (last 3 results) No results for input(s): PROBNP in the last 8760 hours. HbA1C: No results for input(s): HGBA1C in the last 72 hours. CBG: No results for input(s): GLUCAP in the last 168 hours. Lipid Profile: No results for input(s): CHOL, HDL, LDLCALC, TRIG,  CHOLHDL, LDLDIRECT in the last 72 hours. Thyroid Function Tests: No results for input(s): TSH, T4TOTAL, FREET4, T3FREE, THYROIDAB in the last 72 hours. Anemia Panel: No results for input(s): VITAMINB12, FOLATE, FERRITIN, TIBC, IRON, RETICCTPCT in the last 72 hours. Sepsis Labs: Recent Labs  Lab 03/28/21 1255 03/28/21 1712  LATICACIDVEN 1.0 1.2    Recent Results (from the past  240 hour(s))  Culture, blood (Routine x 2)     Status: None (Preliminary result)   Collection Time: 03/28/21 12:55 PM   Specimen: BLOOD RIGHT WRIST  Result Value Ref Range Status   Specimen Description   Final    BLOOD RIGHT WRIST Performed at [facility], [address] [place], [place] [address]    Special Requests   Final    BOTTLES DRAWN AEROBIC AND ANAEROBIC Blood Culture adequate volume Performed at [facility], [address] [place], [place] [address]    Culture   Final    NO GROWTH 4 DAYS Performed at [facility], [address] [place], [place] [address]    Report Status PENDING  Incomplete  Culture, blood (Routine x 2)     Status: Abnormal   Collection Time: 03/28/21  1:00 PM   Specimen: BLOOD  Result Value Ref Range Status   Specimen Description   Final    BLOOD LEFT ARM Performed at [facility], [address] [place], [place] [address]    Special Requests   Final    BOTTLES DRAWN AEROBIC AND ANAEROBIC Blood Culture adequate volume Performed at [facility], [address], [place], [place] [address]    Culture  Setup Time   Final    GRAM NEGATIVE RODS IN BOTH AEROBIC AND ANAEROBIC BOTTLES CRITICAL RESULT CALLED TO, READ BACK BY AND VERIFIED WITH: PHARMD JUSTIN LEGGE  03/29/21 @0949  BY JW Performed at [facility], [address], [place], [place]    Culture ESCHERICHIA COLI (A)  Final   Report Status 04/01/2021 FINAL  Final   Organism ID, Bacteria ESCHERICHIA COLI  Final       Susceptibility   Escherichia coli - MIC*    AMPICILLIN 4 SENSITIVE Sensitive     CEFAZOLIN <=4 SENSITIVE Sensitive     CEFEPIME <=0.12 SENSITIVE Sensitive     CEFTAZIDIME <=1 SENSITIVE Sensitive     CEFTRIAXONE <=0.25 SENSITIVE Sensitive     CIPROFLOXACIN >=4 RESISTANT Resistant     GENTAMICIN <=1 SENSITIVE Sensitive     IMIPENEM <=0.25 SENSITIVE Sensitive     TRIMETH/SULFA >=320 RESISTANT Resistant     AMPICILLIN/SULBACTAM <=2 SENSITIVE Sensitive     PIP/TAZO <=4 SENSITIVE Sensitive     * ESCHERICHIA COLI  Blood Culture ID Panel (Reflexed)     Status: Abnormal   Collection Time: 03/28/21  1:00 PM  Result Value Ref Range Status   Enterococcus faecalis NOT DETECTED NOT DETECTED Final   Enterococcus Faecium NOT DETECTED NOT DETECTED Final   Listeria monocytogenes NOT DETECTED NOT DETECTED Final   Staphylococcus species NOT DETECTED NOT DETECTED Final   Staphylococcus aureus (BCID) NOT DETECTED NOT DETECTED Final   Staphylococcus epidermidis NOT DETECTED NOT DETECTED Final   Staphylococcus lugdunensis NOT DETECTED NOT DETECTED Final   Streptococcus species NOT DETECTED NOT DETECTED Final   Streptococcus agalactiae NOT DETECTED NOT DETECTED Final   Streptococcus pneumoniae NOT DETECTED NOT DETECTED Final   Streptococcus pyogenes NOT DETECTED NOT DETECTED Final   A.calcoaceticus-baumannii NOT DETECTED NOT DETECTED Final   Bacteroides fragilis NOT DETECTED NOT DETECTED Final   Enterobacterales DETECTED (A) NOT DETECTED Final    Comment: Enterobacterales represent a large order of gram negative bacteria, not a single organism. CRITICAL RESULT CALLED TO, READ BACK BY AND VERIFIED WITH: PHARMD JUSTIN LEGG 03/29/21 @0951  BY JW    Enterobacter cloacae complex NOT DETECTED NOT DETECTED Final  Escherichia coli DETECTED (A) NOT DETECTED Final    Comment: CRITICAL RESULT CALLED TO, READ BACK BY AND VERIFIED WITH: PHARMD JUSTIN LEGG 03/29/21  BY JW    Klebsiella aerogenes NOT DETECTED NOT  DETECTED Final   Klebsiella oxytoca NOT DETECTED NOT DETECTED Final   Klebsiella pneumoniae NOT DETECTED NOT DETECTED Final   Proteus species NOT DETECTED NOT DETECTED Final   Salmonella species NOT DETECTED NOT DETECTED Final   Serratia marcescens NOT DETECTED NOT DETECTED Final   Haemophilus influenzae NOT DETECTED NOT DETECTED Final   Neisseria meningitidis NOT DETECTED NOT DETECTED Final   Pseudomonas aeruginosa NOT DETECTED NOT DETECTED Final   Stenotrophomonas maltophilia NOT DETECTED NOT DETECTED Final   Candida albicans NOT DETECTED NOT DETECTED Final   Candida auris NOT DETECTED NOT DETECTED Final   Candida glabrata NOT DETECTED NOT DETECTED Final   Candida krusei NOT DETECTED NOT DETECTED Final   Candida parapsilosis NOT DETECTED NOT DETECTED Final   Candida tropicalis NOT DETECTED NOT DETECTED Final   Cryptococcus neoformans/gattii NOT DETECTED NOT DETECTED Final   CTX-M ESBL NOT DETECTED NOT DETECTED Final   Carbapenem resistance IMP NOT DETECTED NOT DETECTED Final   Carbapenem resistance KPC NOT DETECTED NOT DETECTED Final   Carbapenem resistance NDM NOT DETECTED NOT DETECTED Final   Carbapenem resist OXA 48 LIKE NOT DETECTED NOT DETECTED Final   Carbapenem resistance VIM NOT DETECTED NOT DETECTED Final    Comment: Performed at [facility], [address], [place], [place] [address]  SARS CORONAVIRUS 2 (TAT 6-24 HRS) Nasopharyngeal Nasopharyngeal Swab     Status: None   Collection Time: 03/28/21  2:47 PM   Specimen: Nasopharyngeal Swab  Result Value Ref Range Status   SARS Coronavirus 2 NEGATIVE NEGATIVE Final    Comment: (NOTE) SARS-CoV-2 target nucleic acids are NOT DETECTED.  The SARS-CoV-2 RNA is generally detectable in upper and lower respiratory specimens during the acute phase of infection. Negative results do not preclude SARS-CoV-2 infection, do not rule out co-infections with other pathogens, and should not be used as the sole basis for treatment or  other patient management decisions. Negative results must be combined with clinical observations, patient history, and epidemiological information. The expected result is Negative.  Fact Sheet for Patients: HairSlick.no  Fact Sheet for Healthcare Providers: quierodirigir.com  This test is not yet approved or cleared by the [place] FDA and  has been authorized for detection and/or diagnosis of SARS-CoV-2 by FDA under an Emergency Use Authorization (EUA). This EUA will remain  in effect (meaning this test can be used) for the duration of the COVID-19 declaration under Se ction 564(b)(1) of the Act, 21 U.S.C. section 360bbb-3(b)(1), unless the authorization is terminated or revoked sooner.  Performed at [facility], [address] [place], [place] [address]   MRSA Next Gen by PCR, Nasal     Status: None   Collection Time: 03/28/21  5:01 PM   Specimen: Nasal Mucosa; Nasal Swab  Result Value Ref Range Status   MRSA by PCR Next Gen NOT DETECTED NOT DETECTED Final    Comment: (NOTE) The GeneXpert MRSA Assay (FDA approved for NASAL specimens only), is one component of a comprehensive MRSA colonization surveillance program. It is not intended to diagnose MRSA infection nor to guide or monitor treatment for MRSA infections. Test performance is not FDA approved in patients less than 1 years old. Performed at [facility], [address] [place], [place] [address]  Urine Culture     Status: Abnormal   Collection Time: 03/28/21  9:49 PM   Specimen: Urine, Catheterized  Result Value Ref Range Status   Specimen Description   Final    URINE, CATHETERIZED Performed at [facility], [address] [place], [place] [address]    Special Requests   Final    NONE Performed at [facility], [address], [place], [place] [address]    Culture MULTIPLE SPECIES  PRESENT, SUGGEST RECOLLECTION (A)  Final   Report Status 03/30/2021 FINAL  Final      Radiology Studies: No results found.  Scheduled Meds:  amLODipine  10 mg Oral Daily   buprenorphine  2 mg Sublingual Q6H   Chlorhexidine Gluconate Cloth  6 each Topical Daily   enalaprilat  0.625 mg Intravenous Once   enoxaparin (LOVENOX) injection  40 mg Subcutaneous Q24H   lisinopril  20 mg Oral Daily   mouth rinse  15 mL Mouth Rinse BID   metoprolol tartrate  50 mg Oral BID   pantoprazole (PROTONIX) IV  40 mg Intravenous Q24H   potassium chloride  60 mEq Oral Q6H   sodium chloride flush  10-40 mL Intracatheter Q12H   sodium chloride flush  3 mL Intravenous Q12H   Continuous Infusions:  sodium chloride Stopped (03/31/21 1456)   sodium chloride 75 mL/hr at 04/01/21 0434   cefTRIAXone (ROCEPHIN)  IV     magnesium sulfate bolus IVPB 4 g (04/01/21 1022)     LOS: 4 days   Rickey Barbara, MD [facility] Pager On Amion  If 7PM-7AM, please contact night-coverage 04/01/2021, 11:34 AM

## 2021-04-01 NOTE — Consult Note (Signed)
Date of Admission:  03/28/2021          Reason for Consult:   Complicated E. coli urinary tract infection with gram-negative bacteremia and sepsis  Referring Provider: Rickey Barbara, MD   Assessment:  Complicated E. coli urinary tract infection with E. coli bacteremia and sepsis with encephalopathy Torn right rotator cuff Anxiety and depression on clonazepam Opiate dependence on chronic Suboxone Poorly controlled hypertension Recent penicillin allergy with anaphylaxis per her report in [place] though she is tolerating cephalosporins fortunately.  Plan:  Will change her over to oral cefadroxil to complete a total of 7 days of therapy We will check on her again tomorrow. Also ordering hepatitis serologies.  She denies intravenous drug use but I think she certainly deserves screening for hepatitis C and B (and A)  Active Problems:   Anxiety and depression   Chronic pain syndrome   Acute encephalopathy   Hypertensive urgency   Sepsis (HCC)   Scheduled Meds:  amLODipine  10 mg Oral Daily   buprenorphine  2 mg Sublingual Q6H   cefadroxil  1,000 mg Oral BID   Chlorhexidine Gluconate Cloth  6 each Topical Daily   enalaprilat  0.625 mg Intravenous Once   enoxaparin (LOVENOX) injection  40 mg Subcutaneous Q24H   lisinopril  20 mg Oral Daily   mouth rinse  15 mL Mouth Rinse BID   metoprolol tartrate  50 mg Oral BID   pantoprazole (PROTONIX) IV  40 mg Intravenous Q24H   potassium chloride  60 mEq Oral Q6H   sodium chloride flush  10-40 mL Intracatheter Q12H   sodium chloride flush  3 mL Intravenous Q12H   Continuous Infusions:  sodium chloride 10 mL/hr at 04/01/21 1226   sodium chloride 75 mL/hr at 04/01/21 1226   PRN Meds:.sodium chloride, acetaminophen, clonazePAM, hydrALAZINE, labetalol, ondansetron **OR** ondansetron (ZOFRAN) IV, polyethylene glycol, sodium chloride flush  HPI: Stephanie Carrillo is a 65 y.o. female with history of anxiety depression poorly controlled  hypertension, history of extensive spinal surgery with dependence on chronic opioids managed with Suboxone who initially presented to the ER with symptoms concerning for withdrawal with encephalopathy.  She improved while in the hospital.  During her stay she was noted to have increased frequency urination problems with urinary retention and was found to have pyuria.  She had urine cultures taken and she was discharged on Bactrim.  Was then found by her friend confused and febrile at home.  She was brought to the ER where she continued to be encephalopathic.  Is febrile to nearly 12 F with a white count of 18,400.  She was fairly hypertensive on admission.  Blood cultures and urine cultures were taken and she was admitted to the ICU.  She was started on vancomycin cefepime and metronidazole then narrowed to cefepime and has received 4 days of therapy.  Urine and blood cultures have grown E. coli that is resistant to Bactrim that she was discharged on as well as fluoroquinolones but otherwise sensitive.  Given her penicillin allergy will avoid penicillins but given her tolerance of cephalosporins which she says she has taken on numerous occasions including during this hospitalization we can continue with cephalosporin but will narrow to oral cefadroxil to complete a total of 7 days therapy.  I will check on her again tomorrow.  I spent 84  minutes with the patient including greater than 50% of the time in face to face counseling of the patient regarding the nature of complicated  urinary tract infections bacteremias, personally reviewing her chest x-ray CT head her urine analysis urine cultures blood cultures CBC with differential CMP with GFR, in  review of medical records in preparation for the visit and during the visit and in coordination of her care.       Review of Systems: Review of Systems  Constitutional:  Positive for fever and malaise/fatigue. Negative for chills and weight loss.  HENT:   Negative for congestion and sore throat.   Eyes:  Negative for blurred vision and photophobia.  Respiratory:  Negative for cough, shortness of breath and wheezing.   Cardiovascular:  Negative for chest pain, palpitations and leg swelling.  Gastrointestinal:  Negative for abdominal pain, blood in stool, constipation, diarrhea, heartburn, melena, nausea and vomiting.  Genitourinary:  Positive for dysuria and urgency. Negative for flank pain and hematuria.  Musculoskeletal:  Positive for falls, joint pain and myalgias. Negative for back pain.  Skin:  Negative for itching and rash.  Neurological:  Positive for weakness. Negative for dizziness, focal weakness, loss of consciousness and headaches.  Endo/Heme/Allergies:  Does not bruise/bleed easily.  Psychiatric/Behavioral:  Positive for depression. Negative for suicidal ideas. The patient is nervous/anxious. The patient does not have insomnia.    Past Medical History:  Diagnosis Date   Anxiety    Depression    Hypertension    Insomnia    Meningitis    Recurrent UTI    Seizures (HCC)    Substance abuse (HCC)     Social History   Tobacco Use   Smoking status: Former   Smokeless tobacco: Never  Building services engineer Use: Never used  Substance Use Topics   Alcohol use: Not Currently   Drug use: Not Currently    Comment: opiates -clean 6 months    Family History  Problem Relation Age of Onset   Multiple sclerosis Mother    Cancer Mother        Possibly breast or colon mets to brain   Cancer Father    Lung cancer Father    Heart disease Father    Cancer Brother    Testicular cancer Brother    Prostate cancer Brother    Skin cancer Brother    Colon polyps Brother    Colon polyps Brother    Allergies  Allergen Reactions   Ciprofloxacin Shortness Of Breath and Other (See Comments)    Respiratory arrest   Other Anaphylaxis   Penicillins Shortness Of Breath and Rash    Did it involve swelling of the face/tongue/throat, SOB, or  low BP? y Did it involve sudden or severe rash/hives, skin peeling, or any reaction on the inside of your mouth or nose? y Did you need to seek medical attention at a hospital or doctor's office? n When did it last happen?  1985 If all above answers are "NO", may proceed with cephalosporin use.  Tolerated Ceftriaxone 10/05/20 - 10/07/20   Oxycodone Other (See Comments)    Patient in recovery process.    OBJECTIVE: Blood pressure (!) 151/85, pulse 86, temperature 98.1 F (36.7 C), temperature source Axillary, resp. rate 13, height  (1.702 m), weight 66.3 kg, SpO2 94 %.  Physical Exam Constitutional:      General: She is not in acute distress.    Appearance: Normal appearance. She is well-developed. She is not ill-appearing or diaphoretic.  HENT:     Head: Normocephalic and atraumatic.     Right Ear: Hearing and external ear normal.  Left Ear: Hearing and external ear normal.     Nose: No nasal deformity or rhinorrhea.  Eyes:     General: No scleral icterus.    Conjunctiva/sclera: Conjunctivae normal.     Right eye: Right conjunctiva is not injected.     Left eye: Left conjunctiva is not injected.     Pupils: Pupils are equal, round, and reactive to light.  Neck:     Vascular: No JVD.  Cardiovascular:     Rate and Rhythm: Normal rate and regular rhythm.     Heart sounds: Normal heart sounds, S1 normal and S2 normal. No murmur heard.   No friction rub.  Abdominal:     General: Bowel sounds are normal. There is no distension.     Palpations: Abdomen is soft.     Tenderness: There is no abdominal tenderness.  Musculoskeletal:     Right shoulder: Tenderness present. Decreased range of motion. Decreased strength.     Left shoulder: Normal.     Cervical back: Normal range of motion and neck supple.     Right hip: Normal.     Left hip: Normal.     Right knee: Normal.     Left knee: Normal.  Lymphadenopathy:     Head:     Right side of head: No submandibular, preauricular  or posterior auricular adenopathy.     Left side of head: No submandibular, preauricular or posterior auricular adenopathy.     Cervical: No cervical adenopathy.     Right cervical: No superficial or deep cervical adenopathy.    Left cervical: No superficial or deep cervical adenopathy.  Skin:    General: Skin is warm and dry.     Coloration: Skin is not pale.     Findings: No abrasion, ecchymosis, erythema or rash.     Nails: There is no clubbing.  Neurological:     General: No focal deficit present.     Mental Status: She is alert and oriented to person, place, and time.     Sensory: No sensory deficit.     Coordination: Coordination normal.  Psychiatric:        Attention and Perception: Attention normal. She is attentive.        Mood and Affect: Mood normal.        Speech: Speech normal.        Behavior: Behavior normal. Behavior is cooperative.        Thought Content: Thought content normal.        Cognition and Memory: She exhibits impaired recent memory.        Judgment: Judgment normal.    Lab Results Lab Results  Component Value Date   WBC 12.1 (H) 03/31/2021   HGB 12.0 03/31/2021   HCT 38.0 03/31/2021   MCV 100.5 (H) 03/31/2021   PLT 275 03/31/2021    Lab Results  Component Value Date   CREATININE 0.39 (L) 04/01/2021   BUN 15 04/01/2021   NA 137 04/01/2021   K 2.9 (L) 04/01/2021   CL 101 04/01/2021   CO2 28 04/01/2021    Lab Results  Component Value Date   ALT 21 04/01/2021   AST 21 04/01/2021   ALKPHOS 62 04/01/2021   BILITOT 0.7 04/01/2021     Microbiology: Recent Results (from the past 240 hour(s))  Culture, blood (Routine x 2)     Status: None (Preliminary result)   Collection Time: 03/28/21 12:55 PM   Specimen: BLOOD RIGHT WRIST  Result Value  Ref Range Status   Specimen Description   Final    BLOOD RIGHT WRIST Performed at [facility], [address] [place], [place] [address]    Special Requests   Final    BOTTLES DRAWN AEROBIC AND  ANAEROBIC Blood Culture adequate volume Performed at [facility], [address] [place], [place] [address]    Culture   Final    NO GROWTH 4 DAYS Performed at [facility], [address] [place], [place] [address]    Report Status PENDING  Incomplete  Culture, blood (Routine x 2)     Status: Abnormal   Collection Time: 03/28/21  1:00 PM   Specimen: BLOOD  Result Value Ref Range Status   Specimen Description   Final    BLOOD LEFT ARM Performed at [facility], [address] [place], [place] [address]    Special Requests   Final    BOTTLES DRAWN AEROBIC AND ANAEROBIC Blood Culture adequate volume Performed at [facility], [address] [place], [place] [address]    Culture  Setup Time   Final    GRAM NEGATIVE RODS IN BOTH AEROBIC AND ANAEROBIC BOTTLES CRITICAL RESULT CALLED TO, READ BACK BY AND VERIFIED WITH: PHARMD JUSTIN LEGGE  03/29/21 @0949  BY JW Performed at [facility], [address] [place], [place] [address]    Culture ESCHERICHIA COLI (A)  Final   Report Status 04/01/2021 FINAL  Final   Organism ID, Bacteria ESCHERICHIA COLI  Final      Susceptibility   Escherichia coli - MIC*    AMPICILLIN 4 SENSITIVE Sensitive     CEFAZOLIN <=4 SENSITIVE Sensitive     CEFEPIME <=0.12 SENSITIVE Sensitive     CEFTAZIDIME <=1 SENSITIVE Sensitive     CEFTRIAXONE <=0.25 SENSITIVE Sensitive     CIPROFLOXACIN >=4 RESISTANT Resistant     GENTAMICIN <=1 SENSITIVE Sensitive     IMIPENEM <=0.25 SENSITIVE Sensitive     TRIMETH/SULFA >=320 RESISTANT Resistant     AMPICILLIN/SULBACTAM <=2 SENSITIVE Sensitive     PIP/TAZO <=4 SENSITIVE Sensitive     * ESCHERICHIA COLI  Blood Culture ID Panel (Reflexed)     Status: Abnormal   Collection Time: 03/28/21  1:00 PM  Result Value Ref Range Status   Enterococcus faecalis NOT DETECTED NOT DETECTED Final   Enterococcus Faecium NOT DETECTED NOT DETECTED Final   Listeria  monocytogenes NOT DETECTED NOT DETECTED Final   Staphylococcus species NOT DETECTED NOT DETECTED Final   Staphylococcus aureus (BCID) NOT DETECTED NOT DETECTED Final   Staphylococcus epidermidis NOT DETECTED NOT DETECTED Final   Staphylococcus lugdunensis NOT DETECTED NOT DETECTED Final   Streptococcus species NOT DETECTED NOT DETECTED Final   Streptococcus agalactiae NOT DETECTED NOT DETECTED Final   Streptococcus pneumoniae NOT DETECTED NOT DETECTED Final   Streptococcus pyogenes NOT DETECTED NOT DETECTED Final   A.calcoaceticus-baumannii NOT DETECTED NOT DETECTED Final   Bacteroides fragilis NOT DETECTED NOT DETECTED Final   Enterobacterales DETECTED (A) NOT DETECTED Final    Comment: Enterobacterales represent a large order of gram negative bacteria, not a single organism. CRITICAL RESULT CALLED TO, READ BACK BY AND VERIFIED WITH: PHARMD JUSTIN LEGG 03/29/21 @0951  BY JW    Enterobacter cloacae complex NOT DETECTED NOT DETECTED Final   Escherichia coli DETECTED (A) NOT DETECTED Final    Comment: CRITICAL RESULT CALLED TO, READ BACK BY AND VERIFIED WITH: PHARMD JUSTIN LEGG 03/29/21 @0951  BY JW  Klebsiella aerogenes NOT DETECTED NOT DETECTED Final   Klebsiella oxytoca NOT DETECTED NOT DETECTED Final   Klebsiella pneumoniae NOT DETECTED NOT DETECTED Final   Proteus species NOT DETECTED NOT DETECTED Final   Salmonella species NOT DETECTED NOT DETECTED Final   Serratia marcescens NOT DETECTED NOT DETECTED Final   Haemophilus influenzae NOT DETECTED NOT DETECTED Final   Neisseria meningitidis NOT DETECTED NOT DETECTED Final   Pseudomonas aeruginosa NOT DETECTED NOT DETECTED Final   Stenotrophomonas maltophilia NOT DETECTED NOT DETECTED Final   Candida albicans NOT DETECTED NOT DETECTED Final   Candida auris NOT DETECTED NOT DETECTED Final   Candida glabrata NOT DETECTED NOT DETECTED Final   Candida krusei NOT DETECTED NOT DETECTED Final   Candida parapsilosis NOT DETECTED NOT DETECTED  Final   Candida tropicalis NOT DETECTED NOT DETECTED Final   Cryptococcus neoformans/gattii NOT DETECTED NOT DETECTED Final   CTX-M ESBL NOT DETECTED NOT DETECTED Final   Carbapenem resistance IMP NOT DETECTED NOT DETECTED Final   Carbapenem resistance KPC NOT DETECTED NOT DETECTED Final   Carbapenem resistance NDM NOT DETECTED NOT DETECTED Final   Carbapenem resist OXA 48 LIKE NOT DETECTED NOT DETECTED Final   Carbapenem resistance VIM NOT DETECTED NOT DETECTED Final    Comment: Performed at [facility], [address], [place], [place] [address]  SARS CORONAVIRUS 2 (TAT 6-24 HRS) Nasopharyngeal Nasopharyngeal Swab     Status: None   Collection Time: 03/28/21  2:47 PM   Specimen: Nasopharyngeal Swab  Result Value Ref Range Status   SARS Coronavirus 2 NEGATIVE NEGATIVE Final    Comment: (NOTE) SARS-CoV-2 target nucleic acids are NOT DETECTED.  The SARS-CoV-2 RNA is generally detectable in upper and lower respiratory specimens during the acute phase of infection. Negative results do not preclude SARS-CoV-2 infection, do not rule out co-infections with other pathogens, and should not be used as the sole basis for treatment or other patient management decisions. Negative results must be combined with clinical observations, patient history, and epidemiological information. The expected result is Negative.  Fact Sheet for Patients: HairSlick.no  Fact Sheet for Healthcare Providers: quierodirigir.com  This test is not yet approved or cleared by the [place] FDA and  has been authorized for detection and/or diagnosis of SARS-CoV-2 by FDA under an Emergency Use Authorization (EUA). This EUA will remain  in effect (meaning this test can be used) for the duration of the COVID-19 declaration under Se ction 564(b)(1) of the Act, 21 U.S.C. section 360bbb-3(b)(1), unless the authorization is terminated or revoked  sooner.  Performed at [facility], [address], [place], [place] [address]   MRSA Next Gen by PCR, Nasal     Status: None   Collection Time: 03/28/21  5:01 PM   Specimen: Nasal Mucosa; Nasal Swab  Result Value Ref Range Status   MRSA by PCR Next Gen NOT DETECTED NOT DETECTED Final    Comment: (NOTE) The GeneXpert MRSA Assay (FDA approved for NASAL specimens only), is one component of a comprehensive MRSA colonization surveillance program. It is not intended to diagnose MRSA infection nor to guide or monitor treatment for MRSA infections. Test performance is not FDA approved in patients less than 60 years old. Performed at [facility], [address] [place], [place] [address]   Urine Culture     Status: Abnormal   Collection Time: 03/28/21  9:49 PM   Specimen: Urine, Catheterized  Result Value Ref Range Status   Specimen  Description   Final    URINE, CATHETERIZED Performed at [facility], [address], [place], [place] [address]    Special Requests   Final    NONE Performed at [facility], [address], [place], [place] [address]    Culture MULTIPLE SPECIES PRESENT, SUGGEST RECOLLECTION (A)  Final   Report Status 03/30/2021 FINAL  Final    Acey Lav, MD [facility] [phone] pager  04/01/2021, 2:14 PM

## 2021-04-02 DIAGNOSIS — I160 Hypertensive urgency: Secondary | ICD-10-CM | POA: Diagnosis not present

## 2021-04-02 DIAGNOSIS — B9620 Unspecified Escherichia coli [E. coli] as the cause of diseases classified elsewhere: Secondary | ICD-10-CM

## 2021-04-02 DIAGNOSIS — G894 Chronic pain syndrome: Secondary | ICD-10-CM | POA: Diagnosis not present

## 2021-04-02 DIAGNOSIS — F419 Anxiety disorder, unspecified: Secondary | ICD-10-CM | POA: Diagnosis not present

## 2021-04-02 DIAGNOSIS — R7881 Bacteremia: Secondary | ICD-10-CM

## 2021-04-02 DIAGNOSIS — N390 Urinary tract infection, site not specified: Secondary | ICD-10-CM

## 2021-04-02 DIAGNOSIS — G9340 Encephalopathy, unspecified: Secondary | ICD-10-CM | POA: Diagnosis not present

## 2021-04-02 LAB — CULTURE, BLOOD (ROUTINE X 2)
Culture: NO GROWTH
Special Requests: ADEQUATE

## 2021-04-02 LAB — CBC
HCT: 36.9 % (ref 36.0–46.0)
Hemoglobin: 12.4 g/dL (ref 12.0–15.0)
MCH: 32.8 pg (ref 26.0–34.0)
MCHC: 33.6 g/dL (ref 30.0–36.0)
MCV: 97.6 fL (ref 80.0–100.0)
Platelets: 279 10*3/uL (ref 150–400)
RBC: 3.78 MIL/uL — ABNORMAL LOW (ref 3.87–5.11)
RDW: 14.5 % (ref 11.5–15.5)
WBC: 7.8 10*3/uL (ref 4.0–10.5)
nRBC: 0 % (ref 0.0–0.2)

## 2021-04-02 LAB — COMPREHENSIVE METABOLIC PANEL
ALT: 22 U/L (ref 0–44)
AST: 21 U/L (ref 15–41)
Albumin: 2.8 g/dL — ABNORMAL LOW (ref 3.5–5.0)
Alkaline Phosphatase: 76 U/L (ref 38–126)
Anion gap: 8 (ref 5–15)
BUN: 11 mg/dL (ref 8–23)
CO2: 29 mmol/L (ref 22–32)
Calcium: 8.5 mg/dL — ABNORMAL LOW (ref 8.9–10.3)
Chloride: 94 mmol/L — ABNORMAL LOW (ref 98–111)
Creatinine, Ser: 0.46 mg/dL (ref 0.44–1.00)
GFR, Estimated: >60 mL/min (ref >60)
Glucose, Bld: 95 mg/dL (ref 70–99)
Potassium: 3.2 mmol/L — ABNORMAL LOW (ref 3.5–5.1)
Sodium: 131 mmol/L — ABNORMAL LOW (ref 135–145)
Total Bilirubin: 0.8 mg/dL (ref 0.3–1.2)
Total Protein: 6.9 g/dL (ref 6.5–8.1)

## 2021-04-02 LAB — HEPATITIS B SURFACE ANTIBODY, QUANTITATIVE: Hep B S AB Quant (Post): <3.1 m[IU]/mL — ABNORMAL LOW (ref >9.9)

## 2021-04-02 LAB — HCV RNA QUANT RFLX ULTRA OR GENOTYP
HCV RNA Qnt(log copy/mL): UNDETERMINED log10 IU/mL
HepC Qn: NOT DETECTED IU/mL

## 2021-04-02 LAB — MAGNESIUM: Magnesium: 1.8 mg/dL (ref 1.7–2.4)

## 2021-04-02 MED ORDER — MAGNESIUM SULFATE 2 GM/50ML IV SOLN
2.0000 g | Freq: Once | INTRAVENOUS | Status: AC
  Administered 2021-04-02: 2 g via INTRAVENOUS
  Filled 2021-04-02: qty 50

## 2021-04-02 MED ORDER — DOCUSATE SODIUM 100 MG PO CAPS
100.0000 mg | ORAL_CAPSULE | Freq: Two times a day (BID) | ORAL | Status: DC
  Administered 2021-04-02 – 2021-04-04 (×3): 100 mg via ORAL
  Filled 2021-04-02 (×5): qty 1

## 2021-04-02 MED ORDER — CLONAZEPAM 0.125 MG PO TBDP
0.2500 mg | ORAL_TABLET | Freq: Three times a day (TID) | ORAL | Status: DC | PRN
  Administered 2021-04-02 – 2021-04-05 (×5): 0.25 mg via ORAL
  Filled 2021-04-02 (×5): qty 2

## 2021-04-02 MED ORDER — POTASSIUM CHLORIDE CRYS ER 20 MEQ PO TBCR
40.0000 meq | EXTENDED_RELEASE_TABLET | ORAL | Status: AC
Start: 2021-04-02 — End: 2021-04-02
  Administered 2021-04-02 (×2): 40 meq via ORAL
  Filled 2021-04-02 (×2): qty 2

## 2021-04-02 MED ORDER — METOPROLOL TARTRATE 50 MG PO TABS
100.0000 mg | ORAL_TABLET | Freq: Two times a day (BID) | ORAL | Status: DC
  Administered 2021-04-02 – 2021-04-05 (×6): 100 mg via ORAL
  Filled 2021-04-02 (×6): qty 2

## 2021-04-02 MED ORDER — POLYETHYLENE GLYCOL 3350 17 G PO PACK: 17.0000 g | PACK | Freq: Every day | ORAL | Status: DC | PRN

## 2021-04-02 NOTE — Plan of Care (Signed)
Plan of care reviewed with patient. Patient expressed an understanding regarding care.

## 2021-04-02 NOTE — Progress Notes (Signed)
PROGRESS NOTE    Stephanie Carrillo  AST:[id number] DOB: [birth year] DOA: 03/28/2021 PCP: Lavada Mesi, MD    Brief Narrative:  65 y.o. female with medical history significant of chronic pain, polypharmacy, and HTN comes in with AMS. Pt was recently discharged home for AMS with UTI resistant to bactrim  Assessment & Plan:   Active Problems:   Anxiety and depression   Chronic pain syndrome   Acute encephalopathy   Hypertensive urgency   Sepsis (HCC)    Sepsis due to suspected UTI with ecoli bacteremia -Recent urine cx pos for ecoli resistant to bactrim, failed course of bactrim prior to this admit -Patient had been on cefepime, per urine cx results from last UTI -E. coli cultures are now notable for E. coli resistant to ciprofloxacin and Bactrim, antibiotics changed to Rocephin this morning -Appreciate input by infectious disease. -Recommendations for transition to cefadroxil to complete a total of 7 days of therapy    HTN urgency -very poorly controlled prior to visit -Now on Norvasc dose to 10 mg, metoprolol 50 mg p.o. twice daily, scheduled lisinopril 20 mg -Blood pressure remains suboptimally controlled overnight.  Have increased metoprolol to 100 mg p.o. twice daily   Acute metabolic encephalopathy -similar presentation in past that improved with IV abx -Also consideration for polypharmacy noted -Mentation seems much improved and at baseline -Recommend minimizing sedating medications at time of discharge   Chronic pain -Patient had complained of generalized pain recently -Have resumed sublingual Suboxone per home regimen -Seems stable today   Anxiety/depression -Patient has been on daily clonazepam prior to admission -Have continued patient on clonazepam albeit at lower dose at 0.25 mg 3 times daily as needed  DVT prophylaxis: Lovenox subq Code Status: DNR Family Communication: Pt in room, family is at bedside  Status is: Inpatient  Remains inpatient appropriate  because:Inpatient level of care appropriate due to severity of illness  Dispo: The patient is from: Home              Anticipated d/c is to: Home, awaiting PT eval              Patient currently is not medically stable to d/c.   Difficult to place patient No    Consultants:  Infectious disease  Procedures:    Antimicrobials: Anti-infectives (From admission, onward)    Start     Dose/Rate Route Frequency Ordered Stop   04/01/21 1400  cefTRIAXone (ROCEPHIN) 2 g in sodium chloride 0.9 % 100 mL IVPB  Status:  Discontinued        2 g 200 mL/hr over 30 Minutes Intravenous Daily 04/01/21 0856 04/01/21 1236   04/01/21 1330  cefadroxil (DURICEF) capsule 1,000 mg        1,000 mg Oral 2 times daily 04/01/21 1236 04/04/21 0959   03/28/21 2200  ceFEPIme (MAXIPIME) 2 g in sodium chloride 0.9 % 100 mL IVPB  Status:  Discontinued        2 g 200 mL/hr over 30 Minutes Intravenous Every 8 hours 03/28/21 1559 04/01/21 0855   03/28/21 1430  aztreonam (AZACTAM) 2 g in sodium chloride 0.9 % 100 mL IVPB  Status:  Discontinued        2 g 200 mL/hr over 30 Minutes Intravenous  Once 03/28/21 1418 03/28/21 1427   03/28/21 1430  metroNIDAZOLE (FLAGYL) IVPB 500 mg        500 mg 100 mL/hr over 60 Minutes Intravenous  Once 03/28/21 1418 03/28/21 1637   03/28/21 1430  vancomycin (VANCOCIN) IVPB 1000 mg/200 mL premix        1,000 mg 200 mL/hr over 60 Minutes Intravenous  Once 03/28/21 1418 03/28/21 1706   03/28/21 1430  ceFEPIme (MAXIPIME) 2 g in sodium chloride 0.9 % 100 mL IVPB        2 g 200 mL/hr over 30 Minutes Intravenous  Once 03/28/21 1427 03/28/21 1530       Subjective: Reports feeling much better today.  Eager to go home soon  Objective: Vitals:   04/02/21 1200 04/02/21 1300 04/02/21 1400 04/02/21 1457  BP: (!) 169/65 (!) 145/76 (!) 175/77 (!) 147/79  Pulse: 78 83 87 85  Resp: Temp:      TempSrc:      SpO2: 95% 95% 96% 97%  Weight:      Height:        Intake/Output  Summary (Last 24 hours) at 04/02/2021 1500 Last data filed at 04/02/2021 1243 Gross per 24 hour  Intake 1975.43 ml  Output 6600 ml  Net -4624.57 ml    Filed Weights   03/28/21 1303 03/28/21 1700  Weight: 86.8 kg 66.3 kg    Examination: General exam: Conversant, in no acute distress Respiratory system: normal chest rise, clear, no audible wheezing Cardiovascular system: regular rhythm, s1-s2 Gastrointestinal system: Nondistended, nontender, pos BS Central nervous system: No seizures, no tremors Extremities: No cyanosis, no joint deformities Skin: No rashes, no pallor Psychiatry: Affect normal // no auditory hallucinations   Data Reviewed: I have personally reviewed following labs and imaging studies  CBC: Recent Labs  Lab 03/28/21 1255 03/29/21 0256 03/29/21 1629 03/30/21 0431 03/31/21 0527 04/02/21 0801  WBC 15.8* 18.4*  --  19.5* 12.1* 7.8  NEUTROABS 13.9*  --   --   --   --   --   HGB 13.3 13.8 12.8 13.5 12.0 12.4  HCT 40.6 41.9 38.6 41.9 38.0 36.9  MCV 96.9 97.0  --  97.9 100.5* 97.6  PLT 344 342  --  336 275 279    Basic Metabolic Panel: Recent Labs  Lab 03/29/21 0256 03/30/21 0431 03/31/21 0527 04/01/21 0459 04/02/21 0801  NA 137 139 144 137 131*  K 2.7* 3.5 2.7* 2.9* 3.2*  CL 98 99 106 101 94*  CO2 GLUCOSE 124* 126* 97 85 95  BUN CREATININE 0.62 0.72 0.49 0.39* 0.46  CALCIUM 9.2 9.3 8.2* 7.7* 8.5*  MG 1.6* 2.2  --  1.5* 1.8    GFR: Estimated Creatinine Clearance: 68.2 mL/min (by C-G formula based on SCr of 0.46 mg/dL). Liver Function Tests: Recent Labs  Lab 03/29/21 0256 03/30/21 0431 03/31/21 0527 04/01/21 0459 04/02/21 0801  AST ALT ALKPHOS 112 103 78 62 76  BILITOT 0.8 0.7 0.9 0.7 0.8  PROT 8.3* 8.0 6.9 6.0* 6.9  ALBUMIN 3.7 3.2* 2.7* 2.4* 2.8*    No results for input(s): LIPASE, AMYLASE in the last 168 hours. No results for input(s): AMMONIA in the last 168  hours. Coagulation Profile: Recent Labs  Lab 03/28/21 1255  INR 1.0    Cardiac Enzymes: No results for input(s): CKTOTAL, CKMB, CKMBINDEX, TROPONINI in the last 168 hours. BNP (last 3 results) No results for input(s): PROBNP in the last 8760 hours. HbA1C: No results for input(s): HGBA1C in the last 72 hours. CBG: No results for input(s): GLUCAP in the  last 168 hours. Lipid Profile: No results for input(s): CHOL, HDL, LDLCALC, TRIG, CHOLHDL, LDLDIRECT in the last 72 hours. Thyroid Function Tests: No results for input(s): TSH, T4TOTAL, FREET4, T3FREE, THYROIDAB in the last 72 hours. Anemia Panel: No results for input(s): VITAMINB12, FOLATE, FERRITIN, TIBC, IRON, RETICCTPCT in the last 72 hours. Sepsis Labs: Recent Labs  Lab 03/28/21 1255 03/28/21 1712  LATICACIDVEN 1.0 1.2    Recent Results (from the past 240 hour(s))  Culture, blood (Routine x 2)     Status: None   Collection Time: 03/28/21 12:55 PM   Specimen: BLOOD RIGHT WRIST  Result Value Ref Range Status   Specimen Description   Final    BLOOD RIGHT WRIST Performed at [facility], [address], [place], [place] [address]    Special Requests   Final    BOTTLES DRAWN AEROBIC AND ANAEROBIC Blood Culture adequate volume Performed at [facility], [address], [place], [place] [address]    Culture   Final    NO GROWTH 5 DAYS Performed at [facility], [address] [place], [place] [address]    Report Status 04/02/2021 FINAL  Final  Culture, blood (Routine x 2)     Status: Abnormal   Collection Time: 03/28/21  1:00 PM   Specimen: BLOOD  Result Value Ref Range Status   Specimen Description   Final    BLOOD LEFT ARM Performed at [facility], [address], [place], [place] [address]    Special Requests   Final    BOTTLES DRAWN AEROBIC AND ANAEROBIC Blood Culture adequate volume Performed at [facility], [address]  [place], [place] [address]    Culture  Setup Time   Final    GRAM NEGATIVE RODS IN BOTH AEROBIC AND ANAEROBIC BOTTLES CRITICAL RESULT CALLED TO, READ BACK BY AND VERIFIED WITH: PHARMD JUSTIN LEGGE  03/29/21 @0949  BY JW Performed at [facility], [address] [place], [place]    Culture ESCHERICHIA COLI (A)  Final   Report Status 04/01/2021 FINAL  Final   Organism ID, Bacteria ESCHERICHIA COLI  Final      Susceptibility   Escherichia coli - MIC*    AMPICILLIN 4 SENSITIVE Sensitive     CEFAZOLIN <=4 SENSITIVE Sensitive     CEFEPIME <=0.12 SENSITIVE Sensitive     CEFTAZIDIME <=1 SENSITIVE Sensitive     CEFTRIAXONE <=0.25 SENSITIVE Sensitive     CIPROFLOXACIN >=4 RESISTANT Resistant     GENTAMICIN <=1 SENSITIVE Sensitive     IMIPENEM <=0.25 SENSITIVE Sensitive     TRIMETH/SULFA >=320 RESISTANT Resistant     AMPICILLIN/SULBACTAM <=2 SENSITIVE Sensitive     PIP/TAZO <=4 SENSITIVE Sensitive     * ESCHERICHIA COLI  Blood Culture ID Panel (Reflexed)     Status: Abnormal   Collection Time: 03/28/21  1:00 PM  Result Value Ref Range Status   Enterococcus faecalis NOT DETECTED NOT DETECTED Final   Enterococcus Faecium NOT DETECTED NOT DETECTED Final   Listeria monocytogenes NOT DETECTED NOT DETECTED Final   Staphylococcus species NOT DETECTED NOT DETECTED Final   Staphylococcus aureus (BCID) NOT DETECTED NOT DETECTED Final   Staphylococcus epidermidis NOT DETECTED NOT DETECTED Final   Staphylococcus lugdunensis NOT DETECTED NOT DETECTED Final   Streptococcus species NOT DETECTED NOT DETECTED Final   Streptococcus agalactiae NOT DETECTED NOT DETECTED Final   Streptococcus pneumoniae NOT DETECTED NOT DETECTED Final   Streptococcus pyogenes  NOT DETECTED NOT DETECTED Final   A.calcoaceticus-baumannii NOT DETECTED NOT DETECTED Final   Bacteroides fragilis NOT DETECTED NOT DETECTED Final   Enterobacterales DETECTED (A) NOT DETECTED Final    Comment: Enterobacterales represent a  large order of gram negative bacteria, not a single organism. CRITICAL RESULT CALLED TO, READ BACK BY AND VERIFIED WITH: PHARMD JUSTIN LEGG 03/29/21 @0951  BY JW    Enterobacter cloacae complex NOT DETECTED NOT DETECTED Final   Escherichia coli DETECTED (A) NOT DETECTED Final    Comment: CRITICAL RESULT CALLED TO, READ BACK BY AND VERIFIED WITH: PHARMD JUSTIN LEGG 03/29/21 @0951  BY JW    Klebsiella aerogenes NOT DETECTED NOT DETECTED Final   Klebsiella oxytoca NOT DETECTED NOT DETECTED Final   Klebsiella pneumoniae NOT DETECTED NOT DETECTED Final   Proteus species NOT DETECTED NOT DETECTED Final   Salmonella species NOT DETECTED NOT DETECTED Final   Serratia marcescens NOT DETECTED NOT DETECTED Final   Haemophilus influenzae NOT DETECTED NOT DETECTED Final   Neisseria meningitidis NOT DETECTED NOT DETECTED Final   Pseudomonas aeruginosa NOT DETECTED NOT DETECTED Final   Stenotrophomonas maltophilia NOT DETECTED NOT DETECTED Final   Candida albicans NOT DETECTED NOT DETECTED Final   Candida auris NOT DETECTED NOT DETECTED Final   Candida glabrata NOT DETECTED NOT DETECTED Final   Candida krusei NOT DETECTED NOT DETECTED Final   Candida parapsilosis NOT DETECTED NOT DETECTED Final   Candida tropicalis NOT DETECTED NOT DETECTED Final   Cryptococcus neoformans/gattii NOT DETECTED NOT DETECTED Final   CTX-M ESBL NOT DETECTED NOT DETECTED Final   Carbapenem resistance IMP NOT DETECTED NOT DETECTED Final   Carbapenem resistance KPC NOT DETECTED NOT DETECTED Final   Carbapenem resistance NDM NOT DETECTED NOT DETECTED Final   Carbapenem resist OXA 48 LIKE NOT DETECTED NOT DETECTED Final   Carbapenem resistance VIM NOT DETECTED NOT DETECTED Final    Comment: Performed at [facility], [address] [place], [place] [address]  SARS CORONAVIRUS 2 (TAT 6-24 HRS) Nasopharyngeal Nasopharyngeal Swab     Status: None   Collection Time: 03/28/21  2:47 PM   Specimen: Nasopharyngeal Swab  Result  Value Ref Range Status   SARS Coronavirus 2 NEGATIVE NEGATIVE Final    Comment: (NOTE) SARS-CoV-2 target nucleic acids are NOT DETECTED.  The SARS-CoV-2 RNA is generally detectable in upper and lower respiratory specimens during the acute phase of infection. Negative results do not preclude SARS-CoV-2 infection, do not rule out co-infections with other pathogens, and should not be used as the sole basis for treatment or other patient management decisions. Negative results must be combined with clinical observations, patient history, and epidemiological information. The expected result is Negative.  Fact Sheet for Patients: HairSlick.nohttps://www.fda.gov/media/138098/download  Fact Sheet for Healthcare Providers: quierodirigir.comhttps://www.fda.gov/media/138095/download  This test is not yet approved or cleared by the [place] FDA and  has been authorized for detection and/or diagnosis of SARS-CoV-2 by FDA under an Emergency Use Authorization (EUA). This EUA will remain  in effect (meaning this test can be used) for the duration of the COVID-19 declaration under Se ction 564(b)(1) of the Act, 21 U.S.C. section 360bbb-3(b)(1), unless the authorization is terminated or revoked sooner.  Performed at [facility], [address] [place], [place] [address]   MRSA Next Gen by PCR, Nasal     Status: None   Collection Time: 03/28/21  5:01 PM   Specimen: Nasal Mucosa; Nasal Swab  Result Value Ref Range Status   MRSA by  PCR Next Gen NOT DETECTED NOT DETECTED Final    Comment: (NOTE) The GeneXpert MRSA Assay (FDA approved for NASAL specimens only), is one component of a comprehensive MRSA colonization surveillance program. It is not intended to diagnose MRSA infection nor to guide or monitor treatment for MRSA infections. Test performance is not FDA approved in patients less than 48 years old. Performed at [facility], [address] [place], [place] [address]   Urine Culture      Status: Abnormal   Collection Time: 03/28/21  9:49 PM   Specimen: Urine, Catheterized  Result Value Ref Range Status   Specimen Description   Final    URINE, CATHETERIZED Performed at [facility], [address] [place], [place] [address]    Special Requests   Final    NONE Performed at [facility], [address], [place], [place] [address]    Culture MULTIPLE SPECIES PRESENT, SUGGEST RECOLLECTION (A)  Final   Report Status 03/30/2021 FINAL  Final      Radiology Studies: No results found.  Scheduled Meds:  amLODipine  10 mg Oral Daily   buprenorphine  2 mg Sublingual Q6H   cefadroxil  1,000 mg Oral BID   Chlorhexidine Gluconate Cloth  6 each Topical Daily   docusate sodium  100 mg Oral BID   enalaprilat  0.625 mg Intravenous Once   enoxaparin (LOVENOX) injection  40 mg Subcutaneous Q24H   lisinopril  20 mg Oral Daily   mouth rinse  15 mL Mouth Rinse BID   metoprolol tartrate  100 mg Oral BID   pantoprazole (PROTONIX) IV  40 mg Intravenous Q24H   sodium chloride flush  10-40 mL Intracatheter Q12H   sodium chloride flush  3 mL Intravenous Q12H   Continuous Infusions:  sodium chloride 10 mL/hr at 04/02/21 1243     LOS: 5 days   Rickey Barbara, MD [facility] Pager On Amion  If 7PM-7AM, please contact night-coverage 04/02/2021, 3:00 PM

## 2021-04-02 NOTE — Progress Notes (Signed)
Subjective: Patient having trouble with HTN   Antibiotics:  Anti-infectives (From admission, onward)    Start     Dose/Rate Route Frequency Ordered Stop   04/01/21 1400  cefTRIAXone (ROCEPHIN) 2 g in sodium chloride 0.9 % 100 mL IVPB  Status:  Discontinued        2 g 200 mL/hr over 30 Minutes Intravenous Daily 04/01/21 0856 04/01/21 1236   04/01/21 1330  cefadroxil (DURICEF) capsule 1,000 mg        1,000 mg Oral 2 times daily 04/01/21 1236 04/04/21 0959   03/28/21 2200  ceFEPIme (MAXIPIME) 2 g in sodium chloride 0.9 % 100 mL IVPB  Status:  Discontinued        2 g 200 mL/hr over 30 Minutes Intravenous Every 8 hours 03/28/21 1559 04/01/21 0855   03/28/21 1430  aztreonam (AZACTAM) 2 g in sodium chloride 0.9 % 100 mL IVPB  Status:  Discontinued        2 g 200 mL/hr over 30 Minutes Intravenous  Once 03/28/21 1418 03/28/21 1427   03/28/21 1430  metroNIDAZOLE (FLAGYL) IVPB 500 mg        500 mg 100 mL/hr over 60 Minutes Intravenous  Once 03/28/21 1418 03/28/21 1637   03/28/21 1430  vancomycin (VANCOCIN) IVPB 1000 mg/200 mL premix        1,000 mg 200 mL/hr over 60 Minutes Intravenous  Once 03/28/21 1418 03/28/21 1706   03/28/21 1430  ceFEPIme (MAXIPIME) 2 g in sodium chloride 0.9 % 100 mL IVPB        2 g 200 mL/hr over 30 Minutes Intravenous  Once 03/28/21 1427 03/28/21 1530       Medications: Scheduled Meds:  amLODipine  10 mg Oral Daily   buprenorphine  2 mg Sublingual Q6H   cefadroxil  1,000 mg Oral BID   Chlorhexidine Gluconate Cloth  6 each Topical Daily   docusate sodium  100 mg Oral BID   enalaprilat  0.625 mg Intravenous Once   enoxaparin (LOVENOX) injection  40 mg Subcutaneous Q24H   lisinopril  20 mg Oral Daily   mouth rinse  15 mL Mouth Rinse BID   metoprolol tartrate  100 mg Oral BID   pantoprazole (PROTONIX) IV  40 mg Intravenous Q24H   sodium chloride flush  10-40 mL Intracatheter Q12H   sodium chloride flush  3 mL Intravenous Q12H   Continuous  Infusions:  sodium chloride 10 mL/hr at 04/02/21 1243   PRN Meds:.sodium chloride, acetaminophen, clonazePAM, hydrALAZINE, labetalol, ondansetron **OR** ondansetron (ZOFRAN) IV, polyethylene glycol, sodium chloride flush    Objective: Weight change:   Intake/Output Summary (Last 24 hours) at 04/02/2021 1804 Last data filed at 04/02/2021 1500 Gross per 24 hour  Intake 1975.43 ml  Output 6200 ml  Net -4224.57 ml   Blood pressure (!) 159/69, pulse 90, temperature 97.7 F (36.5 C), temperature source Oral, resp. rate 13, height 5\' 7"  (1.702 m), weight 66.3 kg, SpO2 95 %. Temp:  [97.6 F (36.4 C)-98.7 F (37.1 C)] 97.7 F (36.5 C) (08/30 1600) Pulse Rate:  [78-98] 90 (08/30 1700) Resp:  [11-17] 13 (08/30 1700) BP: (131-180)/(58-88) 159/69 (08/30 1700) SpO2:  [92 %-99 %] 95 % (08/30 1700)  Physical Exam: Physical Exam Constitutional:      General: She is not in acute distress.    Appearance: She is well-developed. She is not diaphoretic.  HENT:     Head: Normocephalic and atraumatic.     Right  Ear: External ear normal.     Left Ear: External ear normal.     Mouth/Throat:     Pharynx: No oropharyngeal exudate.  Eyes:     General: No scleral icterus.    Conjunctiva/sclera: Conjunctivae normal.     Pupils: Pupils are equal, round, and reactive to light.  Cardiovascular:     Rate and Rhythm: Normal rate and regular rhythm.  Pulmonary:     Effort: Pulmonary effort is normal. No respiratory distress.     Breath sounds: No wheezing or rales.  Abdominal:     General: There is no distension.     Palpations: Abdomen is soft.     Tenderness: There is no abdominal tenderness. There is no rebound.  Musculoskeletal:        General: No tenderness. Normal range of motion.  Lymphadenopathy:     Cervical: No cervical adenopathy.  Skin:    General: Skin is warm and dry.     Coloration: Skin is not pale.     Findings: No erythema or rash.  Neurological:     General: No focal deficit  present.     Mental Status: She is alert and oriented to person, place, and time.     Motor: No abnormal muscle tone.     Coordination: Coordination normal.  Psychiatric:        Mood and Affect: Mood normal.        Behavior: Behavior normal.        Thought Content: Thought content normal.        Judgment: Judgment normal.     CBC:    BMET Recent Labs    04/01/21 0459 04/02/21 0801  NA 137 131*  K 2.9* 3.2*  CL 101 94*  CO2 28 29  GLUCOSE 85 95  BUN 15 11  CREATININE 0.39* 0.46  CALCIUM 7.7* 8.5*     Liver Panel  Recent Labs    04/01/21 0459 04/02/21 0801  PROT 6.0* 6.9  ALBUMIN 2.4* 2.8*  AST 21 21  ALT 21 22  ALKPHOS 62 76  BILITOT 0.7 0.8       Sedimentation Rate No results for input(s): ESRSEDRATE in the last 72 hours. C-Reactive Protein No results for input(s): CRP in the last 72 hours.  Micro Results: Recent Results (from the past 720 hour(s))  Resp Panel by RT-PCR (Flu A&B, Covid) Nasopharyngeal Swab     Status: None   Collection Time: 03/17/21  4:15 PM   Specimen: Nasopharyngeal Swab; Nasopharyngeal(NP) swabs in vial transport medium  Result Value Ref Range Status   SARS Coronavirus 2 by RT PCR NEGATIVE NEGATIVE Final    Comment: (NOTE) SARS-CoV-2 target nucleic acids are NOT DETECTED.  The SARS-CoV-2 RNA is generally detectable in upper respiratory specimens during the acute phase of infection. The lowest concentration of SARS-CoV-2 viral copies this assay can detect is 138 copies/mL. A negative result does not preclude SARS-Cov-2 infection and should not be used as the sole basis for treatment or other patient management decisions. A negative result may occur with  improper specimen collection/handling, submission of specimen other than nasopharyngeal swab, presence of viral mutation(s) within the areas targeted by this assay, and inadequate number of viral copies(<138 copies/mL). A negative result must be combined with clinical  observations, patient history, and epidemiological information. The expected result is Negative.  Fact Sheet for Patients:  BloggerCourse.com  Fact Sheet for Healthcare Providers:  SeriousBroker.it  This test is no t yet approved or cleared  by the [place] and  has been authorized for detection and/or diagnosis of SARS-CoV-2 by FDA under an Emergency Use Authorization (EUA). This EUA will remain  in effect (meaning this test can be used) for the duration of the COVID-19 declaration under Section 564(b)(1) of the Act, 21 U.S.C.section 360bbb-3(b)(1), unless the authorization is terminated  or revoked sooner.       Influenza A by PCR NEGATIVE NEGATIVE Final   Influenza B by PCR NEGATIVE NEGATIVE Final    Comment: (NOTE) The Xpert Xpress SARS-CoV-2/FLU/RSV plus assay is intended as an aid in the diagnosis of influenza from Nasopharyngeal swab specimens and should not be used as a sole basis for treatment. Nasal washings and aspirates are unacceptable for Xpert Xpress SARS-CoV-2/FLU/RSV testing.  Fact Sheet for Patients: BloggerCourse.com  Fact Sheet for Healthcare Providers: SeriousBroker.it  This test is not yet approved or cleared by the [place] FDA and has been authorized for detection and/or diagnosis of SARS-CoV-2 by FDA under an Emergency Use Authorization (EUA). This EUA will remain in effect (meaning this test can be used) for the duration of the COVID-19 declaration under Section 564(b)(1) of the Act, 21 U.S.C. section 360bbb-3(b)(1), unless the authorization is terminated or revoked.  Performed at [facility], [address] [place], [place] [address]   MRSA Next Gen by PCR, Nasal     Status: None   Collection Time: 03/18/21 10:02 AM   Specimen: Nasal Mucosa; Nasal Swab  Result Value Ref Range Status   MRSA by PCR Next Gen NOT  DETECTED NOT DETECTED Final    Comment: (NOTE) The GeneXpert MRSA Assay (FDA approved for NASAL specimens only), is one component of a comprehensive MRSA colonization surveillance program. It is not intended to diagnose MRSA infection nor to guide or monitor treatment for MRSA infections. Test performance is not FDA approved in patients less than 76 years old. Performed at [facility], [address] [place], [place] [address]   Urine Culture     Status: Abnormal   Collection Time: 03/21/21  9:01 AM   Specimen: Urine, Catheterized  Result Value Ref Range Status   Specimen Description   Final    URINE, CATHETERIZED Performed at [facility], [address], [place], [place] [address]    Special Requests   Final    NONE Performed at [facility], [address] [place], [place] [address]    Culture >=100,000 COLONIES/mL ESCHERICHIA COLI (A)  Final   Report Status 03/24/2021 FINAL  Final   Organism ID, Bacteria ESCHERICHIA COLI (A)  Final      Susceptibility   Escherichia coli - MIC*    AMPICILLIN <=2 SENSITIVE Sensitive     CEFAZOLIN <=4 SENSITIVE Sensitive     CEFEPIME <=0.12 SENSITIVE Sensitive     CEFTRIAXONE <=0.25 SENSITIVE Sensitive     CIPROFLOXACIN >=4 RESISTANT Resistant     GENTAMICIN <=1 SENSITIVE Sensitive     IMIPENEM <=0.25 SENSITIVE Sensitive     NITROFURANTOIN <=16 SENSITIVE Sensitive     TRIMETH/SULFA >=320 RESISTANT Resistant     AMPICILLIN/SULBACTAM <=2 SENSITIVE Sensitive     PIP/TAZO <=4 SENSITIVE Sensitive     * >=100,000 COLONIES/mL ESCHERICHIA COLI  Culture, blood (Routine x 2)     Status: None   Collection Time: 03/28/21 12:55 PM   Specimen: BLOOD RIGHT WRIST  Result Value Ref Range Status   Specimen Description   Final    BLOOD RIGHT  WRIST Performed at [facility], [address] [place], [place] [address]    Special Requests   Final    BOTTLES DRAWN AEROBIC AND ANAEROBIC Blood  Culture adequate volume Performed at [facility], [address], [place], [place] [address]    Culture   Final    NO GROWTH 5 DAYS Performed at [facility], [address] [place], [place] [address]    Report Status 04/02/2021 FINAL  Final  Culture, blood (Routine x 2)     Status: Abnormal   Collection Time: 03/28/21  1:00 PM   Specimen: BLOOD  Result Value Ref Range Status   Specimen Description   Final    BLOOD LEFT ARM Performed at [facility], [address] [place] [address], [place], [place] [address]    Special Requests   Final    BOTTLES DRAWN AEROBIC AND ANAEROBIC Blood Culture adequate volume Performed at [facility], [address] [place], [place] [address]    Culture  Setup Time   Final    GRAM NEGATIVE RODS IN BOTH AEROBIC AND ANAEROBIC BOTTLES CRITICAL RESULT CALLED TO, READ BACK BY AND VERIFIED WITH: PHARMD JUSTIN LEGGE  03/29/21  BY JW Performed at [facility], [address], [place], [place] [address]    Culture ESCHERICHIA COLI (A)  Final   Report Status 04/01/2021 FINAL  Final   Organism ID, Bacteria ESCHERICHIA COLI  Final      Susceptibility   Escherichia coli - MIC*    AMPICILLIN 4 SENSITIVE Sensitive     CEFAZOLIN <=4 SENSITIVE Sensitive     CEFEPIME <=0.12 SENSITIVE Sensitive     CEFTAZIDIME <=1 SENSITIVE Sensitive     CEFTRIAXONE <=0.25 SENSITIVE Sensitive     CIPROFLOXACIN >=4 RESISTANT Resistant     GENTAMICIN <=1 SENSITIVE Sensitive     IMIPENEM <=0.25 SENSITIVE Sensitive     TRIMETH/SULFA >=320 RESISTANT Resistant     AMPICILLIN/SULBACTAM <=2 SENSITIVE Sensitive     PIP/TAZO <=4 SENSITIVE Sensitive     * ESCHERICHIA COLI  Blood Culture ID Panel (Reflexed)     Status: Abnormal   Collection Time: 03/28/21  1:00 PM  Result Value Ref Range Status   Enterococcus faecalis NOT DETECTED NOT DETECTED Final   Enterococcus Faecium NOT DETECTED NOT DETECTED Final   Listeria monocytogenes  NOT DETECTED NOT DETECTED Final   Staphylococcus species NOT DETECTED NOT DETECTED Final   Staphylococcus aureus (BCID) NOT DETECTED NOT DETECTED Final   Staphylococcus epidermidis NOT DETECTED NOT DETECTED Final   Staphylococcus lugdunensis NOT DETECTED NOT DETECTED Final   Streptococcus species NOT DETECTED NOT DETECTED Final   Streptococcus agalactiae NOT DETECTED NOT DETECTED Final   Streptococcus pneumoniae NOT DETECTED NOT DETECTED Final   Streptococcus pyogenes NOT DETECTED NOT DETECTED Final   A.calcoaceticus-baumannii NOT DETECTED NOT DETECTED Final   Bacteroides fragilis NOT DETECTED NOT DETECTED Final   Enterobacterales DETECTED (A) NOT DETECTED Final    Comment: Enterobacterales represent a large order of gram negative bacteria, not a single organism. CRITICAL RESULT CALLED TO, READ BACK BY AND VERIFIED WITH: PHARMD JUSTIN LEGG 03/29/21  BY JW    Enterobacter cloacae complex NOT DETECTED NOT DETECTED Final   Escherichia coli DETECTED (A) NOT DETECTED Final    Comment: CRITICAL RESULT CALLED TO, READ BACK BY AND VERIFIED WITH: PHARMD JUSTIN LEGG 03/29/21  BY JW    Klebsiella aerogenes NOT DETECTED NOT DETECTED Final   Klebsiella oxytoca NOT  DETECTED NOT DETECTED Final   Klebsiella pneumoniae NOT DETECTED NOT DETECTED Final   Proteus species NOT DETECTED NOT DETECTED Final   Salmonella species NOT DETECTED NOT DETECTED Final   Serratia marcescens NOT DETECTED NOT DETECTED Final   Haemophilus influenzae NOT DETECTED NOT DETECTED Final   Neisseria meningitidis NOT DETECTED NOT DETECTED Final   Pseudomonas aeruginosa NOT DETECTED NOT DETECTED Final   Stenotrophomonas maltophilia NOT DETECTED NOT DETECTED Final   Candida albicans NOT DETECTED NOT DETECTED Final   Candida auris NOT DETECTED NOT DETECTED Final   Candida glabrata NOT DETECTED NOT DETECTED Final   Candida krusei NOT DETECTED NOT DETECTED Final   Candida parapsilosis NOT DETECTED NOT DETECTED Final    Candida tropicalis NOT DETECTED NOT DETECTED Final   Cryptococcus neoformans/gattii NOT DETECTED NOT DETECTED Final   CTX-M ESBL NOT DETECTED NOT DETECTED Final   Carbapenem resistance IMP NOT DETECTED NOT DETECTED Final   Carbapenem resistance KPC NOT DETECTED NOT DETECTED Final   Carbapenem resistance NDM NOT DETECTED NOT DETECTED Final   Carbapenem resist OXA 48 LIKE NOT DETECTED NOT DETECTED Final   Carbapenem resistance VIM NOT DETECTED NOT DETECTED Final    Comment: Performed at [facility], [address] [place], [place] [address]  SARS CORONAVIRUS 2 (TAT 6-24 HRS) Nasopharyngeal Nasopharyngeal Swab     Status: None   Collection Time: 03/28/21  2:47 PM   Specimen: Nasopharyngeal Swab  Result Value Ref Range Status   SARS Coronavirus 2 NEGATIVE NEGATIVE Final    Comment: (NOTE) SARS-CoV-2 target nucleic acids are NOT DETECTED.  The SARS-CoV-2 RNA is generally detectable in upper and lower respiratory specimens during the acute phase of infection. Negative results do not preclude SARS-CoV-2 infection, do not rule out co-infections with other pathogens, and should not be used as the sole basis for treatment or other patient management decisions. Negative results must be combined with clinical observations, patient history, and epidemiological information. The expected result is Negative.  Fact Sheet for Patients: HairSlick.no  Fact Sheet for Healthcare Providers: quierodirigir.com  This test is not yet approved or cleared by the [place] FDA and  has been authorized for detection and/or diagnosis of SARS-CoV-2 by FDA under an Emergency Use Authorization (EUA). This EUA will remain  in effect (meaning this test can be used) for the duration of the COVID-19 declaration under Se ction 564(b)(1) of the Act, 21 U.S.C. section 360bbb-3(b)(1), unless the authorization is terminated or revoked sooner.  Performed at  [facility], [address], [place], [place] [address]   MRSA Next Gen by PCR, Nasal     Status: None   Collection Time: 03/28/21  5:01 PM   Specimen: Nasal Mucosa; Nasal Swab  Result Value Ref Range Status   MRSA by PCR Next Gen NOT DETECTED NOT DETECTED Final    Comment: (NOTE) The GeneXpert MRSA Assay (FDA approved for NASAL specimens only), is one component of a comprehensive MRSA colonization surveillance program. It is not intended to diagnose MRSA infection nor to guide or monitor treatment for MRSA infections. Test performance is not FDA approved in patients less than 47 years old. Performed at [facility], [address] [place], [place] [address]   Urine Culture     Status: Abnormal   Collection Time: 03/28/21  9:49 PM   Specimen: Urine, Catheterized  Result Value Ref Range Status   Specimen Description   Final    URINE, CATHETERIZED Performed at [facility], [address], [place], [place] [address]    Special Requests   Final    NONE Performed at [facility], [address], [place], [place] [address]    Culture MULTIPLE SPECIES PRESENT, SUGGEST RECOLLECTION (A)  Final   Report Status 03/30/2021 FINAL  Final    Studies/Results: No results found.    Assessment/Plan:  INTERVAL HISTORY: difficulty with blood pressure control   Active Problems:   Anxiety and depression   Chronic pain syndrome   Acute encephalopathy   Hypertensive urgency   Sepsis (HCC)    Veena Sturgess is a 65 y.o. female with E coli UTI, complicated with bacteremia sepsis and enceaphalopathy. She has comoribid polypharmacy anxiety, opiate dependence  #1  Complicated E. coli urinary tract infection with bacteremia and sepsis  --We will complete 7 days of total therapy with cefadroxil   #2 Screening for viral hepatitides.  She has negative hepatitis C antibody negative hepatitis B surface antigen negative total  hepatitis A antibody --Recommend initiation of vaccination against hep A B  #3 HTN: Has been difficult to control still in ICU stepdown overnight  We will sign off for now please call further questions.       LOS: 5 days   Acey Lav 04/02/2021, 6:04 PM

## 2021-04-03 LAB — CBC
HCT: 40.3 % (ref 36.0–46.0)
Hemoglobin: 13 g/dL (ref 12.0–15.0)
MCH: 31.9 pg (ref 26.0–34.0)
MCHC: 32.3 g/dL (ref 30.0–36.0)
MCV: 98.8 fL (ref 80.0–100.0)
Platelets: 306 10*3/uL (ref 150–400)
RBC: 4.08 MIL/uL (ref 3.87–5.11)
RDW: 14.4 % (ref 11.5–15.5)
WBC: 7.9 10*3/uL (ref 4.0–10.5)
nRBC: 0 % (ref 0.0–0.2)

## 2021-04-03 LAB — COMPREHENSIVE METABOLIC PANEL
ALT: 24 U/L (ref 0–44)
AST: 22 U/L (ref 15–41)
Albumin: 3.2 g/dL — ABNORMAL LOW (ref 3.5–5.0)
Alkaline Phosphatase: 78 U/L (ref 38–126)
Anion gap: 9 (ref 5–15)
BUN: 11 mg/dL (ref 8–23)
CO2: 30 mmol/L (ref 22–32)
Calcium: 9.2 mg/dL (ref 8.9–10.3)
Chloride: 101 mmol/L (ref 98–111)
Creatinine, Ser: 0.45 mg/dL (ref 0.44–1.00)
GFR, Estimated: >60 mL/min (ref >60)
Glucose, Bld: 113 mg/dL — ABNORMAL HIGH (ref 70–99)
Potassium: 3.8 mmol/L (ref 3.5–5.1)
Sodium: 140 mmol/L (ref 135–145)
Total Bilirubin: 0.6 mg/dL (ref 0.3–1.2)
Total Protein: 7.5 g/dL (ref 6.5–8.1)

## 2021-04-03 MED ORDER — PANTOPRAZOLE SODIUM 40 MG PO TBEC
40.0000 mg | DELAYED_RELEASE_TABLET | Freq: Every day | ORAL | Status: DC
  Administered 2021-04-03 – 2021-04-05 (×3): 40 mg via ORAL
  Filled 2021-04-03 (×3): qty 1

## 2021-04-03 NOTE — Evaluation (Signed)
Physical Therapy Evaluation Patient Details Name: Stephanie Carrillo MRN: [id number] DOB: 1956/03/22 Today's Date: 04/03/2021   History of Present Illness  Patient is a 65 year old female admitted with AMS. Was recently admitted to hospital with similar symptoms relating to UTI. Patient also with acute R rotator cuff tear. PMh includes chronic pain, HTN, anxiety/depression, seizure disorder, hx substance abuse  Clinical Impression  Patient is very weak and requires assistance for ambulation. Patient had large BM, some incontinence.  Patient known to this Clinical research associate from previous admission. Patient presents with decline in  functional mobility and requires assistance  PT recommends 24/7 assistance at Dc unless progress improves .  Patient  reports has a friend who can assist, ? Brother is available. No family present. Pt admitted with above diagnosis.  Pt currently with functional limitations due to the deficits listed below (see PT Problem List). Pt will benefit from skilled PT to increase their independence and safety with mobility to allow discharge to the venue listed below.       Follow Up Recommendations Home health PT vs SNF unless patient has 24/7 caregivers or improves a lot.    Equipment Recommendations  None recommended by PT    Recommendations for Other Services       Precautions / Restrictions Precautions Precautions: Fall Precaution Comments: does not have sling for R UE in hospital with her Required Braces or Orthoses: Sling Restrictions Weight Bearing Restrictions: No      Mobility  Bed Mobility Overal bed mobility: Needs Assistance Bed Mobility: Supine to Sit     Supine to sit: Mod assist Sit to supine: Min guard   General bed mobility comments: assist with trunk moving to right, min guard to return to supine    Transfers Overall transfer level: Needs assistance Equipment used: 1 person hand held assist Transfers: Sit to/from Stand Sit to Stand: Mod assist;+2  safety/equipment         General transfer comment: from bed, mod steady assist.  Ambulation/Gait Ambulation/Gait assistance: Mod assist Gait Distance (Feet): 10 Feet (x 2)   Gait Pattern/deviations: Step-to pattern;Staggering right;Staggering left Gait velocity: decr   General Gait Details: 10' to BR with HHA and SPC, than 10' with HHA  Stairs            Wheelchair Mobility    Modified Rankin (Stroke Patients Only)       Balance Overall balance assessment: Needs assistance Sitting-balance support: Feet supported Sitting balance-Leahy Scale: Fair     Standing balance support: During functional activity Standing balance-Leahy Scale: Poor Standing balance comment: static standing min A for safety, mod for dynamic/ambulation from BR stating i am very weak                             Pertinent Vitals/Pain Pain Assessment: Faces Faces Pain Scale: Hurts little more Pain Location: R shoulder Pain Descriptors / Indicators: Guarding Pain Intervention(s): Monitored during session    Home Living Family/patient expects to be discharged to:: Private residence Living Arrangements: Alone Available Help at Discharge: Friend(s) Type of Home: Apartment Home Access: Level entry     Home Layout: One level Home Equipment: Shower seat Additional Comments: brother brought cane and put shower seat in apt    Prior Function Level of Independence: Independent         Comments: not driving, orders groceries for delivery, friend drives to appointments, brother also assists, but may not be local  Hand Dominance   Dominant Hand: Right    Extremity/Trunk Assessment   Upper Extremity Assessment Upper Extremity Assessment: RUE deficits/detail RUE Deficits / Details: recent R rotator cuff tear, limited R shoulder use. elbow and wrist ROM appear WFL         Cervical / Trunk Assessment Cervical / Trunk Assessment: Normal  Communication   Communication:  No difficulties  Cognition Arousal/Alertness: Lethargic Behavior During Therapy: Flat affect Overall Cognitive Status: Within Functional Limits for tasks assessed                                 General Comments: initially alert, becaME MORE SOMNOLENT AND  WENT TO SLEEP WHEN RETURNED TO SUPINE      General Comments General comments (skin integrity, edema, etc.): BP taken once patient supine in bed 143/77, O2 98% on room air    Exercises     Assessment/Plan    PT Assessment Patient needs continued PT services  PT Problem List Decreased strength;Decreased knowledge of precautions;Decreased mobility;Decreased knowledge of use of DME;Decreased activity tolerance       PT Treatment Interventions Therapeutic activities;Gait training;Therapeutic exercise;Patient/family education;Functional mobility training    PT Goals (Current goals can be found in the Care Plan section)  Acute Rehab PT Goals Patient Stated Goal: "I need to lay down" PT Goal Formulation: With patient Time For Goal Achievement: 04/17/21 Potential to Achieve Goals: Fair    Frequency Min 3X/week   Barriers to discharge Decreased caregiver support      Co-evaluation               AM-PAC PT "6 Clicks" Mobility  Outcome Measure Help needed turning from your back to your side while in a flat bed without using bedrails?: A Lot Help needed moving from lying on your back to sitting on the side of a flat bed without using bedrails?: A Lot Help needed moving to and from a bed to a chair (including a wheelchair)?: A Lot Help needed standing up from a chair using your arms (e.g., wheelchair or bedside chair)?: A Lot Help needed to walk in hospital room?: A Lot Help needed climbing 3-5 steps with a railing? : Total 6 Click Score: 11    End of Session Equipment Utilized During Treatment: Gait belt Activity Tolerance: Patient limited by lethargy;Patient limited by fatigue Patient left: in bed;with  call bell/phone within reach;with nursing/sitter in room Nurse Communication: Mobility status      Time: 4562-5638 PT Time Calculation (min) (ACUTE ONLY): 23 min   Charges:   PT Evaluation $PT Eval Low Complexity: 1 Low          Blanchard Kelch PT Acute Rehabilitation Services Pager [phone] Office [phone]   Rada Hay 04/03/2021, 1:27 PM

## 2021-04-03 NOTE — Progress Notes (Signed)
[facility]  PROGRESS NOTE  Stephanie Carrillo OJJ:[id number] DOB: [birth year] DOA: 03/28/2021 PCP: Lavada Mesi, MD   Brief HPI:   65 year old female with history of chronic pain syndrome, polypharmacy, hypertension presented with altered mental status.  She was recently discharged on 426 with UTI, resistant to Bactrim.    Subjective   Denies any pain.   Assessment/Plan:     Sepsis due to UTI with E. coli bacteremia -Recent urine culture positive for E. coli resistant to Bactrim -Patient is on cefepime, per urine culture results from last UTI -Antibiotics narrowed down to Rocephin -ID was consulted, recommended to transition to cefadroxil to complete 7 days of therapy  Hypertensive urgency -Poorly controlled hypertension prior to visit -Now on amlodipine, metoprolol, lisinopril -Blood pressure is well controlled  Acute metabolic encephalopathy -Likely from sepsis -Resolved with antibiotics  Chronic pain syndrome -Recently complained of generalized pain -Continue Suboxone per home regimen  Anxiety/depression -Patient has been on clonazepam prior to admission -Have continued patient on clonazepam at low-dose of 0.25 mg 3 times daily as needed  Scheduled medications:    amLODipine  10 mg Oral Daily   buprenorphine  2 mg Sublingual Q6H   cefadroxil  1,000 mg Oral BID   Chlorhexidine Gluconate Cloth  6 each Topical Daily   docusate sodium  100 mg Oral BID   enalaprilat  0.625 mg Intravenous Once   enoxaparin (LOVENOX) injection  40 mg Subcutaneous Q24H   lisinopril  20 mg Oral Daily   mouth rinse  15 mL Mouth Rinse BID   metoprolol tartrate  100 mg Oral BID   pantoprazole  40 mg Oral Daily   sodium chloride flush  10-40 mL Intracatheter Q12H   sodium chloride flush  3 mL Intravenous Q12H         Data Reviewed:   CBG:  No results for input(s): GLUCAP in the last 168 hours.  SpO2: 99 % O2 Flow Rate (L/min): 2 L/min    Vitals:   04/03/21 0614  04/03/21 0908 04/03/21 0909 04/03/21 1223  BP: 132/74 136/83  140/76  Pulse: 76  82 74  Resp: 18  17   Temp: 98.7 F (37.1 C)   (!) 97.5 F (36.4 C)  TempSrc:    Oral  SpO2: 98%   99%  Weight:      Height:         Intake/Output Summary (Last 24 hours) at 04/03/2021 2024 Last data filed at 04/03/2021 1745 Gross per 24 hour  Intake 160 ml  Output 1400 ml  Net -1240 ml    08/30 0701 - 08/31 1900 In: 605.4 [P.O.:160; I.V.:395.4] Out: 4400 [Urine:4400]  Filed Weights   03/28/21 1303 03/28/21 1700  Weight: 86.8 kg 66.3 kg    CBC:  Recent Labs  Lab 03/28/21 1255 03/29/21 0256 03/29/21 1629 03/30/21 0431 03/31/21 0527 04/02/21 0801 04/03/21 0311  WBC 15.8* 18.4*  --  19.5* 12.1* 7.8 7.9  HGB 13.3 13.8 12.8 13.5 12.0 12.4 13.0  HCT 40.6 41.9 38.6 41.9 38.0 36.9 40.3  PLT 344 342  --  336 275 279 306  MCV 96.9 97.0  --  97.9 100.5* 97.6 98.8  MCH 31.7 31.9  --  31.5 31.7 32.8 31.9  MCHC 32.8 32.9  --  32.2 31.6 33.6 32.3  RDW 14.7 14.5  --  14.9 15.1 14.5 14.4  LYMPHSABS 0.3*  --   --   --   --   --   --   MONOABS  1.3*  --   --   --   --   --   --   EOSABS 0.0  --   --   --   --   --   --   BASOSABS 0.1  --   --   --   --   --   --     Complete metabolic panel:  Recent Labs  Lab 03/28/21 1255 03/28/21 1712 03/29/21 0256 03/30/21 0431 03/31/21 0527 04/01/21 0459 04/02/21 0801 04/03/21 0311  NA 140  --  137 139 144 137 131* 140  K 3.7  --  2.7* 3.5 2.7* 2.9* 3.2* 3.8  CL 107  --  98 99 106 101 94* 101  CO2 22  --  26 28 26 28 29 30   GLUCOSE 114*  --  124* 126* 97 85 95 113*  BUN 17  --  11 19 20 15 11 11   CREATININE 0.78  --  0.62 0.72 0.49 0.39* 0.46 0.45  CALCIUM 9.4  --  9.2 9.3 8.2* 7.7* 8.5* 9.2  AST 18  --  23 24 18 21 21 22   ALT 23  --  23 21 18 21 22 24   ALKPHOS 102  --  112 103 78 62 76 78  BILITOT 0.5  --  0.8 0.7 0.9 0.7 0.8 0.6  ALBUMIN 4.0  --  3.7 3.2* 2.7* 2.4* 2.8* 3.2*  MG  --   --  1.6* 2.2  --  1.5* 1.8  --   LATICACIDVEN 1.0 1.2   --   --   --   --   --   --   INR 1.0  --   --   --   --   --   --   --     No results for input(s): LIPASE, AMYLASE in the last 168 hours.  Recent Labs  Lab 03/28/21 1447  SARSCOV2NAA NEGATIVE    ------------------------------------------------------------------------------------------------------------------ No results for input(s): CHOL, HDL, LDLCALC, TRIG, CHOLHDL, LDLDIRECT in the last 72 hours.  Lab Results  Component Value Date   HGBA1C 5.1 10/26/2020   ------------------------------------------------------------------------------------------------------------------ No results for input(s): TSH, T4TOTAL, T3FREE, THYROIDAB in the last 72 hours.  Invalid input(s): FREET3 ------------------------------------------------------------------------------------------------------------------ No results for input(s): VITAMINB12, FOLATE, FERRITIN, TIBC, IRON, RETICCTPCT in the last 72 hours.  Coagulation profile Recent Labs  Lab 03/28/21 1255  INR 1.0   No results for input(s): DDIMER in the last 72 hours.  Cardiac Enzymes No results for input(s): CKTOTAL, CKMB, CKMBINDEX, TROPONINI in the last 168 hours.  ------------------------------------------------------------------------------------------------------------------    Component Value Date/Time   BNP 231.5 (H) 02/23/2020 2103     Antibiotics: Anti-infectives (From admission, onward)    Start     Dose/Rate Route Frequency Ordered Stop   04/01/21 1400  cefTRIAXone (ROCEPHIN) 2 g in sodium chloride 0.9 % 100 mL IVPB  Status:  Discontinued        2 g 200 mL/hr over 30 Minutes Intravenous Daily 04/01/21 0856 04/01/21 1236   04/01/21 1330  cefadroxil (DURICEF) capsule 1,000 mg        1,000 mg Oral 2 times daily 04/01/21 1236 04/04/21 0959   03/28/21 2200  ceFEPIme (MAXIPIME) 2 g in sodium chloride 0.9 % 100 mL IVPB  Status:  Discontinued        2 g 200 mL/hr over 30 Minutes Intravenous Every 8 hours 03/28/21 1559  04/01/21 0855   03/28/21 1430  aztreonam (AZACTAM) 2 g in sodium  chloride 0.9 % 100 mL IVPB  Status:  Discontinued        2 g 200 mL/hr over 30 Minutes Intravenous  Once 03/28/21 1418 03/28/21 1427   03/28/21 1430  metroNIDAZOLE (FLAGYL) IVPB 500 mg        500 mg 100 mL/hr over 60 Minutes Intravenous  Once 03/28/21 1418 03/28/21 1637   03/28/21 1430  vancomycin (VANCOCIN) IVPB 1000 mg/200 mL premix        1,000 mg 200 mL/hr over 60 Minutes Intravenous  Once 03/28/21 1418 03/28/21 1706   03/28/21 1430  ceFEPIme (MAXIPIME) 2 g in sodium chloride 0.9 % 100 mL IVPB        2 g 200 mL/hr over 30 Minutes Intravenous  Once 03/28/21 1427 03/28/21 1530        Radiology Reports  No results found.    DVT prophylaxis: Lovenox  Code Status: DNR  Family Communication: No family at bedside   Consultants:   Procedures:     Objective    Physical Examination:  General-appears in no acute distress Heart-S1-S2, regular, no murmur auscultated Lungs-clear to auscultation bilaterally, no wheezing or crackles auscultated Abdomen-soft, nontender, no organomegaly Extremities-no edema in the lower extremities Neuro-alert, oriented x3, no focal deficit noted   Status is: Inpatient  Dispo: The patient is from: Home              Anticipated d/c is to: Home              Anticipated d/c date is: 04/04/2021              Patient currently not stable for discharge  Barrier to discharge-awaiting PT evaluation  COVID-19 Labs  No results for input(s): DDIMER, FERRITIN, LDH, CRP in the last 72 hours.  Lab Results  Component Value Date   SARSCOV2NAA NEGATIVE 03/28/2021   SARSCOV2NAA NEGATIVE 03/17/2021   SARSCOV2NAA NEGATIVE 02/23/2020   SARSCOV2NAA NEGATIVE 01/27/2020    Microbiology  Recent Results (from the past 240 hour(s))  Culture, blood (Routine x 2)     Status: None   Collection Time: 03/28/21 12:55 PM   Specimen: BLOOD RIGHT WRIST  Result Value Ref Range Status    Specimen Description   Final    BLOOD RIGHT WRIST Performed at [facility], [address] [place], [place] [address]    Special Requests   Final    BOTTLES DRAWN AEROBIC AND ANAEROBIC Blood Culture adequate volume Performed at [facility], [address] [place], [place] [address]    Culture   Final    NO GROWTH 5 DAYS Performed at [facility], [address], [place], [place] [address]    Report Status 04/02/2021 FINAL  Final  Culture, blood (Routine x 2)     Status: Abnormal   Collection Time: 03/28/21  1:00 PM   Specimen: BLOOD  Result Value Ref Range Status   Specimen Description   Final    BLOOD LEFT ARM Performed at [facility], [address] [place], [place] [address]    Special Requests   Final    BOTTLES DRAWN AEROBIC AND ANAEROBIC Blood Culture adequate volume Performed at [facility], [address] [place], [place] [address]    Culture  Setup Time   Final    GRAM NEGATIVE RODS IN BOTH AEROBIC AND ANAEROBIC BOTTLES CRITICAL RESULT CALLED TO, READ BACK BY AND VERIFIED WITH: PHARMD JUSTIN LEGGE  03/29/21 @0949  BY  JW Performed at [facility], [address], [place], [place] [address]    Culture ESCHERICHIA COLI (A)  Final   Report Status 04/01/2021 FINAL  Final   Organism ID, Bacteria ESCHERICHIA COLI  Final      Susceptibility   Escherichia coli - MIC*    AMPICILLIN 4 SENSITIVE Sensitive     CEFAZOLIN <=4 SENSITIVE Sensitive     CEFEPIME <=0.12 SENSITIVE Sensitive     CEFTAZIDIME <=1 SENSITIVE Sensitive     CEFTRIAXONE <=0.25 SENSITIVE Sensitive     CIPROFLOXACIN >=4 RESISTANT Resistant     GENTAMICIN <=1 SENSITIVE Sensitive     IMIPENEM <=0.25 SENSITIVE Sensitive     TRIMETH/SULFA >=320 RESISTANT Resistant     AMPICILLIN/SULBACTAM <=2 SENSITIVE Sensitive     PIP/TAZO <=4 SENSITIVE Sensitive     * ESCHERICHIA COLI  Blood Culture ID Panel (Reflexed)     Status: Abnormal    Collection Time: 03/28/21  1:00 PM  Result Value Ref Range Status   Enterococcus faecalis NOT DETECTED NOT DETECTED Final   Enterococcus Faecium NOT DETECTED NOT DETECTED Final   Listeria monocytogenes NOT DETECTED NOT DETECTED Final   Staphylococcus species NOT DETECTED NOT DETECTED Final   Staphylococcus aureus (BCID) NOT DETECTED NOT DETECTED Final   Staphylococcus epidermidis NOT DETECTED NOT DETECTED Final   Staphylococcus lugdunensis NOT DETECTED NOT DETECTED Final   Streptococcus species NOT DETECTED NOT DETECTED Final   Streptococcus agalactiae NOT DETECTED NOT DETECTED Final   Streptococcus pneumoniae NOT DETECTED NOT DETECTED Final   Streptococcus pyogenes NOT DETECTED NOT DETECTED Final   A.calcoaceticus-baumannii NOT DETECTED NOT DETECTED Final   Bacteroides fragilis NOT DETECTED NOT DETECTED Final   Enterobacterales DETECTED (A) NOT DETECTED Final    Comment: Enterobacterales represent a large order of gram negative bacteria, not a single organism. CRITICAL RESULT CALLED TO, READ BACK BY AND VERIFIED WITH: PHARMD JUSTIN LEGG 03/29/21  BY JW    Enterobacter cloacae complex NOT DETECTED NOT DETECTED Final   Escherichia coli DETECTED (A) NOT DETECTED Final    Comment: CRITICAL RESULT CALLED TO, READ BACK BY AND VERIFIED WITH: PHARMD JUSTIN LEGG 03/29/21  BY JW    Klebsiella aerogenes NOT DETECTED NOT DETECTED Final   Klebsiella oxytoca NOT DETECTED NOT DETECTED Final   Klebsiella pneumoniae NOT DETECTED NOT DETECTED Final   Proteus species NOT DETECTED NOT DETECTED Final   Salmonella species NOT DETECTED NOT DETECTED Final   Serratia marcescens NOT DETECTED NOT DETECTED Final   Haemophilus influenzae NOT DETECTED NOT DETECTED Final   Neisseria meningitidis NOT DETECTED NOT DETECTED Final   Pseudomonas aeruginosa NOT DETECTED NOT DETECTED Final   Stenotrophomonas maltophilia NOT DETECTED NOT DETECTED Final   Candida albicans NOT DETECTED NOT DETECTED Final    Candida auris NOT DETECTED NOT DETECTED Final   Candida glabrata NOT DETECTED NOT DETECTED Final   Candida krusei NOT DETECTED NOT DETECTED Final   Candida parapsilosis NOT DETECTED NOT DETECTED Final   Candida tropicalis NOT DETECTED NOT DETECTED Final   Cryptococcus neoformans/gattii NOT DETECTED NOT DETECTED Final   CTX-M ESBL NOT DETECTED NOT DETECTED Final   Carbapenem resistance IMP NOT DETECTED NOT DETECTED Final   Carbapenem resistance KPC NOT DETECTED NOT DETECTED Final   Carbapenem resistance NDM NOT DETECTED NOT DETECTED Final   Carbapenem resist OXA 48 LIKE NOT DETECTED NOT DETECTED Final   Carbapenem resistance VIM NOT DETECTED NOT DETECTED Final    Comment: Performed at [facility], [address] [place], [place] [address]  SARS CORONAVIRUS 2 (TAT 6-24 HRS) Nasopharyngeal Nasopharyngeal Swab     Status: None   Collection Time: 03/28/21  2:47 PM   Specimen: Nasopharyngeal Swab  Result Value Ref Range Status   SARS Coronavirus 2 NEGATIVE NEGATIVE Final    Comment: (NOTE) SARS-CoV-2 target nucleic acids are NOT DETECTED.  The SARS-CoV-2 RNA is generally detectable in upper and lower respiratory specimens during the acute phase of infection. Negative results do not preclude SARS-CoV-2 infection, do not rule out co-infections with other pathogens, and should not be used as the sole basis for treatment or other patient management decisions. Negative results must be combined with clinical observations, patient history, and epidemiological information. The expected result is Negative.  Fact Sheet for Patients: HairSlick.no  Fact Sheet for Healthcare Providers: quierodirigir.com  This test is not yet approved or cleared by the [place] FDA and  has been authorized for detection and/or diagnosis of SARS-CoV-2 by FDA under an Emergency Use Authorization (EUA). This EUA will remain  in effect (meaning this  test can be used) for the duration of the COVID-19 declaration under Se ction 564(b)(1) of the Act, 21 U.S.C. section 360bbb-3(b)(1), unless the authorization is terminated or revoked sooner.  Performed at [facility], [address], [place], [place] [address]   MRSA Next Gen by PCR, Nasal     Status: None   Collection Time: 03/28/21  5:01 PM   Specimen: Nasal Mucosa; Nasal Swab  Result Value Ref Range Status   MRSA by PCR Next Gen NOT DETECTED NOT DETECTED Final    Comment: (NOTE) The GeneXpert MRSA Assay (FDA approved for NASAL specimens only), is one component of a comprehensive MRSA colonization surveillance program. It is not intended to diagnose MRSA infection nor to guide or monitor treatment for MRSA infections. Test performance is not FDA approved in patients less than 2 years old. Performed at [facility], [address] [place], [place] [address]   Urine Culture     Status: Abnormal   Collection Time: 03/28/21  9:49 PM   Specimen: Urine, Catheterized  Result Value Ref Range Status   Specimen Description   Final    URINE, CATHETERIZED Performed at [facility], [address] [place], [place] [address]    Special Requests   Final    NONE Performed at [facility], [address] [place], [place] [address]    Culture MULTIPLE SPECIES PRESENT, SUGGEST RECOLLECTION (A)  Final   Report Status 03/30/2021 FINAL  Final             Meredeth Ide   [facility] If 7PM-7AM, please contact night-coverage at [email or web address], Office  [phone]   04/03/2021, 8:24 PM  LOS: 6 days

## 2021-04-03 NOTE — TOC Initial Note (Signed)
Transition of Care [facility]) - Initial/Assessment Note    Patient Details  Name: Stephanie Carrillo MRN: [id number] Date of Birth: Jun 05, 1956  Transition of Care [facility]) CM/SW Contact:    Ida Rogue, LCSW Phone Number: 04/03/2021, 2:19 PM  Clinical Narrative:   Patient seen in follow up to PT recommendation of 24/7 supervision/care/SNF.  Ms Schlup politely declined SNF.  She stated her friend Ms Mayford Knife and Ms Mayford Knife' sister would provide 24 hr supervision, and she plans to return home to her cat.  Furthermore, she declined HH PT, stating she would contact her insurance instead and ask for "[employer]" services, which she said was someone coming out to assess her situation.  It would appear she has limited insight into the extent of her needs.  I spoke with Ms Mayford Knife, who expressed concern not only for the patient, but also that she does not feel like she can give her the care she needs after this last incident in which she was found down in her apartment. She plans to come speak with her today about her unwillingness to care for her unless she first seeks help at short term rehab. TOC will continue to follow during the course of hospitalization.                 Expected Discharge Plan: Home/Self Care Barriers to Discharge: No Barriers Identified   Patient Goals and CMS Choice        Expected Discharge Plan and Services Expected Discharge Plan: Home/Self Care   Discharge Planning Services: CM Consult   Living arrangements for the past 2 months: Apartment Expected Discharge Date:  (unknown)                                    Prior Living Arrangements/Services Living arrangements for the past 2 months: Apartment Lives with:: Self Patient language and need for interpreter reviewed:: Yes        Need for Family Participation in Patient Care: Yes (Comment) Care giver support system in place?: Yes (comment)   Criminal Activity/Legal Involvement Pertinent to Current  Situation/Hospitalization: No - Comment as needed  Activities of Daily Living Home Assistive Devices/Equipment: Eyeglasses, Grab bars in shower, Other (Comment) (walk-in shower) ADL Screening (condition at time of admission) Patient's cognitive ability adequate to safely complete daily activities?: No Is the patient deaf or have difficulty hearing?: No Does the patient have difficulty seeing, even when wearing glasses/contacts?: No Does the patient have difficulty concentrating, remembering, or making decisions?: Yes Patient able to express need for assistance with ADLs?: Yes Does the patient have difficulty dressing or bathing?: No Independently performs ADLs?: Yes (appropriate for developmental age) Does the patient have difficulty walking or climbing stairs?: Yes (secondary to weakness) Weakness of Legs: Both Weakness of Arms/Hands: None  Permission Sought/Granted Permission sought to share information with : Family Supports Permission granted to share information with : No  Share Information with NAME: Gary Fleet (Friend)   [phone]           Emotional Assessment Appearance:: Appears stated age Attitude/Demeanor/Rapport: Engaged Affect (typically observed): Appropriate Orientation: : Oriented to Self, Oriented to Place, Oriented to Situation Alcohol / Substance Use: Not Applicable Psych Involvement: No (comment)  Admission diagnosis:  Sepsis (HCC) [A41.9] Sepsis, due to unspecified organism, unspecified whether acute organ dysfunction present [facility]) [A41.9] Patient Active Problem List   Diagnosis Date Noted   E coli bacteremia  Sepsis (HCC) 03/28/2021   Medication withdrawal (HCC) 03/18/2021   Hypertensive urgency 03/18/2021   Accelerated hypertension 03/17/2021   Acute encephalopathy 03/17/2021   AKI (acute kidney injury) (HCC)    Fall    Complicated UTI (urinary tract infection) 10/04/2020   Nausea vomiting and diarrhea    Acute metabolic encephalopathy  02/23/2020   Benzodiazepine withdrawal (HCC) 02/01/2020   Hypokalemia    Tachycardia    Altered mental state 01/27/2020   Anxiety and depression 09/23/2018   Insomnia 09/23/2018   Chronic pain syndrome 09/23/2018   PCP:  Lavada Mesi, MD Pharmacy:   [id number] - [place], [place] - [address] AT [facility] OF [address] & [address] [place] [address] Phone: [phone] Fax: [phone]     Social Determinants of Health (SDOH) Interventions    Readmission Risk Interventions No flowsheet data found.

## 2021-04-03 NOTE — Evaluation (Signed)
Occupational Therapy Evaluation Patient Details Name: Stephanie Carrillo MRN: [id number] DOB: 1956/04/06 Today's Date: 04/03/2021    History of Present Illness Patient is a 65 year old female admitted with AMS. Was recently admitted to hospital with similar symptoms relating to UTI. Patient also with acute R rotator cuff tear. PMh includes chronic pain, HTN, anxiety/depression, seizure disorder, hx substance abuse   Clinical Impression   Patient typically lives alone, has a friend that assists with IADL tasks such as grocery shopping. Patient is typically independent with self care tasks, however was recently hospitalized ~10 days ago after fall resulting in R rotator cuff tear. Patient was given sling however does not currently have in hospital. Patient presenting with global weakness, decreased standing tolerance and balance. Patient min A to power up to standing from toilet, total A for perianal care after significant bowel movement (was unaware one had started in bed) and min x2 for safety to ambulate back to bed without AD. Patient repeatedly stating "I need to lay down" while in bathroom, does endorse dizziness. Once returned supine BP taken 143/77. Currently would recommend 24/7 support at D/C given increased need of assist for basic ADL tasks, if family/friends unable to provide would recommend SNF. Acute OT to follow and will update D/C recs pending patient progress.    Follow Up Recommendations  Home health OT;Other (comment);Supervision/Assistance - 24 hour (vs SNF if patient does not have 24/7 support at D/C)    Equipment Recommendations  None recommended by OT           Precautions / Restrictions Precautions Precautions: Fall Precaution Comments: does not have sling for R UE in hospital with her Restrictions Weight Bearing Restrictions: No      Mobility Bed Mobility Overal bed mobility: Needs Assistance Bed Mobility: Sit to Supine       Sit to supine: Min guard   General  bed mobility comments: for safety    Transfers Overall transfer level: Needs assistance Equipment used: None Transfers: Sit to/from Stand Sit to Stand: Min assist;+2 safety/equipment;+2 physical assistance         General transfer comment: please see toilet transfer in ADL section    Balance Overall balance assessment: Needs assistance Sitting-balance support: Feet supported Sitting balance-Leahy Scale: Fair     Standing balance support: During functional activity Standing balance-Leahy Scale: Poor Standing balance comment: static standing min A for safety                           ADL either performed or assessed with clinical judgement   ADL Overall ADL's : Needs assistance/impaired     Grooming: Minimal assistance;Sitting   Upper Body Bathing: Minimal assistance;Sitting   Lower Body Bathing: Moderate assistance;Sitting/lateral leans;Sit to/from stand   Upper Body Dressing : Minimal assistance;Sitting   Lower Body Dressing: Moderate assistance;Sitting/lateral leans;Sit to/from stand   Toilet Transfer: Minimal assistance;+2 for physical assistance;+2 for safety/equipment;Cueing for safety;Ambulation;Regular Teacher, adult education Details (indicate cue type and reason): patient needing min A x1 to power up to standing from toilet and min x2 for safety due to feeling weak and dizzy to ambulate back to bed. Toileting- Clothing Manipulation and Hygiene: Total assistance;Sit to/from stand Toileting - Clothing Manipulation Details (indicate cue type and reason): patient had significant bowel movement, reports had not gone in almost a week. patient with limited standing tolerance and reporting dizziness "I need to go back to bed" needing total A to complete peri anal care.  note patient had stool on legs and in peri area upon laying down, CNA arrive to complete cleaning in bed     Functional mobility during ADLs: Minimal assistance;+2 for safety/equipment;+2 for  physical assistance General ADL Comments: patient with overall decreased activity tolerance, strength needing increased assistance with self care tasks                         Pertinent Vitals/Pain Pain Assessment: Faces Faces Pain Scale: Hurts little more Pain Location: R shoulder Pain Descriptors / Indicators: Guarding Pain Intervention(s): Monitored during session     Hand Dominance Right   Extremity/Trunk Assessment Upper Extremity Assessment Upper Extremity Assessment: RUE deficits/detail RUE Deficits / Details: recent R rotator cuff tear, limited R shoulder use. elbow and wrist ROM appear WFL       Cervical / Trunk Assessment Cervical / Trunk Assessment: Normal   Communication Communication Communication: No difficulties   Cognition Arousal/Alertness: Awake/alert Behavior During Therapy: Flat affect Overall Cognitive Status: Within Functional Limits for tasks assessed                                 General Comments: patient following directions appropriately however repeatedly stating "I gotta lay down"   General Comments  BP taken once patient supine in bed 143/77, O2 98% on room air            Home Living Family/patient expects to be discharged to:: Private residence Living Arrangements: Alone Available Help at Discharge: Friend(s) Type of Home: Apartment Home Access: Level entry     Home Layout: One level     Bathroom Shower/Tub: Producer, television/film/video: Standard     Home Equipment: Grab bars - tub/shower          Prior Functioning/Environment Level of Independence: Independent        Comments: not driving, orders groceries for delivery, friend drives to appointments, brother also in room and assists, but may not be local        OT Problem List: Decreased activity tolerance;Impaired balance (sitting and/or standing);Decreased safety awareness;Pain;Impaired UE functional use;Decreased range of motion       OT Treatment/Interventions: Self-care/ADL training;Balance training;Patient/family education;Therapeutic activities    OT Goals(Current goals can be found in the care plan section) Acute Rehab OT Goals Patient Stated Goal: "I need to lay down" OT Goal Formulation: With patient Time For Goal Achievement: 04/17/21 Potential to Achieve Goals: Good  OT Frequency: Min 2X/week    AM-PAC OT "6 Clicks" Daily Activity     Outcome Measure Help from another person eating meals?: None Help from another person taking care of personal grooming?: A Little Help from another person toileting, which includes using toliet, bedpan, or urinal?: A Lot Help from another person bathing (including washing, rinsing, drying)?: A Lot Help from another person to put on and taking off regular upper body clothing?: A Little Help from another person to put on and taking off regular lower body clothing?: A Lot 6 Click Score: 16   End of Session Nurse Communication: Mobility status;Other (comment) (large bowel movement)  Activity Tolerance: Patient limited by fatigue Patient left: in bed;with call bell/phone within reach;with nursing/sitter in room  OT Visit Diagnosis: Unsteadiness on feet (R26.81);Other abnormalities of gait and mobility (R26.89);History of falling (Z91.81);Pain Pain - Right/Left: Right Pain - part of body: Shoulder  Time: 2778-2423 OT Time Calculation (min): 13 min Charges:  OT General Charges $OT Visit: 1 Visit OT Evaluation $OT Eval Low Complexity: 1 Low  Marlyce Huge OT OT pager: [phone]  Carmelia Roller 04/03/2021, 10:49 AM

## 2021-04-04 LAB — CREATININE, SERUM
Creatinine, Ser: 0.52 mg/dL (ref 0.44–1.00)
GFR, Estimated: >60 mL/min (ref >60)

## 2021-04-04 MED ORDER — CHLORHEXIDINE GLUCONATE CLOTH 2 % EX PADS
6.0000 | MEDICATED_PAD | Freq: Every day | CUTANEOUS | Status: DC
  Administered 2021-04-04 – 2021-04-05 (×2): 6 via TOPICAL

## 2021-04-04 NOTE — Plan of Care (Signed)
Pt is injury free, afebrile, alert and oriented x 4. VS within baseline. She denies chest pain, SOB, or acute changes. Will continue to monitor   Problem: Fluid Volume: Goal: Hemodynamic stability will improve Outcome: Progressing   Problem: Clinical Measurements: Goal: Diagnostic test results will improve Outcome: Progressing Goal: Signs and symptoms of infection will decrease Outcome: Progressing   Problem: Respiratory: Goal: Ability to maintain adequate ventilation will improve Outcome: Progressing   Problem: Education: Goal: Knowledge of General Education information will improve Description: Including pain rating scale, medication(s)/side effects and non-pharmacologic comfort measures Outcome: Progressing   Problem: Clinical Measurements: Goal: Ability to maintain clinical measurements within normal limits will improve Outcome: Progressing Goal: Will remain free from infection Outcome: Progressing Goal: Diagnostic test results will improve Outcome: Progressing Goal: Respiratory complications will improve Outcome: Progressing Goal: Cardiovascular complication will be avoided Outcome: Progressing

## 2021-04-04 NOTE — Progress Notes (Addendum)
[facility]  PROGRESS NOTE  Stephanie BackersLisa Musolf ZOX:[id number] DOB: [birth year] DOA: 03/28/2021 PCP: Lavada MesiHilts, Michael, MD   Brief HPI:   65 year old female with history of chronic pain syndrome, polypharmacy, hypertension presented with altered mental status.  She was recently discharged on 426 with UTI, resistant to Bactrim.    Subjective   Denies pain, no shortness of breath.  Wants to go to rehab.   Assessment/Plan:     Sepsis due to UTI with E. coli bacteremia -Recent urine culture positive for E. coli resistant to Bactrim -Patient is on cefepime, per urine culture results from last UTI -Antibiotics narrowed down to Rocephin -ID was consulted, recommended to transition to cefadroxil to complete 7 days of therapy  Hypertensive urgency -Poorly controlled hypertension prior to visit -Now on amlodipine, metoprolol, lisinopril -Blood pressure is well controlled  Acute metabolic encephalopathy -Likely from sepsis -Resolved with antibiotics  Chronic pain syndrome -Recently complained of generalized pain -Continue Suboxone per home regimen  Anxiety/depression -Patient has been on clonazepam prior to admission -Have continued patient on clonazepam at low-dose of 0.25 mg 3 times daily as needed  Scheduled medications:    amLODipine  10 mg Oral Daily   buprenorphine  2 mg Sublingual Q6H   Chlorhexidine Gluconate Cloth  6 each Topical Daily   docusate sodium  100 mg Oral BID   enalaprilat  0.625 mg Intravenous Once   enoxaparin (LOVENOX) injection  40 mg Subcutaneous Q24H   lisinopril  20 mg Oral Daily   metoprolol tartrate  100 mg Oral BID   pantoprazole  40 mg Oral Daily   sodium chloride flush  10-40 mL Intracatheter Q12H   sodium chloride flush  3 mL Intravenous Q12H         Data Reviewed:   CBG:  No results for input(s): GLUCAP in the last 168 hours.  SpO2: 97 % O2 Flow Rate (L/min): 2 L/min    Vitals:   04/03/21 2124 04/04/21 0425 04/04/21 1111  04/04/21 1341  BP: (!) 141/63 123/63 128/71 108/61  Pulse: 84 72 78 69  Resp: 20 18    Temp: 98 F (36.7 C) (!) 97.5 F (36.4 C)  97.8 F (36.6 C)  TempSrc: Oral Oral  Oral  SpO2: 96% 94%  97%  Weight:      Height:         Intake/Output Summary (Last 24 hours) at 04/04/2021 1826 Last data filed at 04/04/2021 1823 Gross per 24 hour  Intake 240 ml  Output 1800 ml  Net -1560 ml    08/30 1901 - 09/01 0700 In: 160 [P.O.:160] Out: 1800 [Urine:1800]  Filed Weights   03/28/21 1303 03/28/21 1700  Weight: 86.8 kg 66.3 kg    CBC:  Recent Labs  Lab 03/29/21 0256 03/29/21 1629 03/30/21 0431 03/31/21 0527 04/02/21 0801 04/03/21 0311  WBC 18.4*  --  19.5* 12.1* 7.8 7.9  HGB 13.8 12.8 13.5 12.0 12.4 13.0  HCT 41.9 38.6 41.9 38.0 36.9 40.3  PLT 342  --  336 275 279 306  MCV 97.0  --  97.9 100.5* 97.6 98.8  MCH 31.9  --  31.5 31.7 32.8 31.9  MCHC 32.9  --  32.2 31.6 33.6 32.3  RDW 14.5  --  14.9 15.1 14.5 14.4    Complete metabolic panel:  Recent Labs  Lab 03/29/21 0256 03/30/21 0431 03/31/21 0527 04/01/21 0459 04/02/21 0801 04/03/21 0311 04/04/21 0415  NA 137 139 144 137 131* 140  --   K 2.7*  3.5 2.7* 2.9* 3.2* 3.8  --   CL 98 99 106 101 94* 101  --   CO2 --   GLUCOSE 124* 126* 97 85 95 113*  --   BUN --   CREATININE 0.62 0.72 0.49 0.39* 0.46 0.45 0.52  CALCIUM 9.2 9.3 8.2* 7.7* 8.5* 9.2  --   AST --   ALT --   ALKPHOS 112 103 78 62 76 78  --   BILITOT 0.8 0.7 0.9 0.7 0.8 0.6  --   ALBUMIN 3.7 3.2* 2.7* 2.4* 2.8* 3.2*  --   MG 1.6* 2.2  --  1.5* 1.8  --   --     No results for input(s): LIPASE, AMYLASE in the last 168 hours.  No results for input(s): CRP, DDIMER, BNP, PROCALCITON, SARSCOV2NAA in the last 168 hours.  Invalid input(s): LACTICACID   ------------------------------------------------------------------------------------------------------------------ No results for  input(s): CHOL, HDL, LDLCALC, TRIG, CHOLHDL, LDLDIRECT in the last 72 hours.  Lab Results  Component Value Date   HGBA1C 5.1 10/26/2020   ------------------------------------------------------------------------------------------------------------------ No results for input(s): TSH, T4TOTAL, T3FREE, THYROIDAB in the last 72 hours.  Invalid input(s): FREET3 ------------------------------------------------------------------------------------------------------------------ No results for input(s): VITAMINB12, FOLATE, FERRITIN, TIBC, IRON, RETICCTPCT in the last 72 hours.  Coagulation profile No results for input(s): INR, PROTIME in the last 168 hours.  No results for input(s): DDIMER in the last 72 hours.  Cardiac Enzymes No results for input(s): CKTOTAL, CKMB, CKMBINDEX, TROPONINI in the last 168 hours.  ------------------------------------------------------------------------------------------------------------------    Component Value Date/Time   BNP 231.5 (H) 02/23/2020 2103     Antibiotics: Anti-infectives (From admission, onward)    Start     Dose/Rate Route Frequency Ordered Stop   04/01/21 1400  cefTRIAXone (ROCEPHIN) 2 g in sodium chloride 0.9 % 100 mL IVPB  Status:  Discontinued        2 g 200 mL/hr over 30 Minutes Intravenous Daily 04/01/21 0856 04/01/21 1236   04/01/21 1330  cefadroxil (DURICEF) capsule 1,000 mg        1,000 mg Oral 2 times daily 04/01/21 1236 04/03/21 2228   03/28/21 2200  ceFEPIme (MAXIPIME) 2 g in sodium chloride 0.9 % 100 mL IVPB  Status:  Discontinued        2 g 200 mL/hr over 30 Minutes Intravenous Every 8 hours 03/28/21 1559 04/01/21 0855   03/28/21 1430  aztreonam (AZACTAM) 2 g in sodium chloride 0.9 % 100 mL IVPB  Status:  Discontinued        2 g 200 mL/hr over 30 Minutes Intravenous  Once 03/28/21 1418 03/28/21 1427   03/28/21 1430  metroNIDAZOLE (FLAGYL) IVPB 500 mg        500 mg 100 mL/hr over 60 Minutes Intravenous  Once 03/28/21 1418  03/28/21 1637   03/28/21 1430  vancomycin (VANCOCIN) IVPB 1000 mg/200 mL premix        1,000 mg 200 mL/hr over 60 Minutes Intravenous  Once 03/28/21 1418 03/28/21 1706   03/28/21 1430  ceFEPIme (MAXIPIME) 2 g in sodium chloride 0.9 % 100 mL IVPB        2 g 200 mL/hr over 30 Minutes Intravenous  Once 03/28/21 1427 03/28/21 1530        Radiology Reports  No results found.    DVT prophylaxis: Lovenox  Code Status: DNR  Family Communication: No family at bedside   Consultants:   Procedures:     Objective    Physical Examination:  General-appears in no acute distress Heart-S1-S2, regular, no murmur auscultated Lungs-clear to auscultation bilaterally, no wheezing or crackles auscultated Abdomen-soft, nontender, no organomegaly Extremities-no edema in the lower extremities Neuro-alert, oriented x3, no focal deficit noted   Status is: Inpatient  Dispo: The patient is from: Home              Anticipated d/c is to: Skilled nursing facility              Anticipated d/c date is: 04/06/2021              Patient currently not stable for discharge  Barrier to discharge-awaiting PT evaluation  COVID-19 Labs  No results for input(s): DDIMER, FERRITIN, LDH, CRP in the last 72 hours.  Lab Results  Component Value Date   SARSCOV2NAA NEGATIVE 03/28/2021   SARSCOV2NAA NEGATIVE 03/17/2021   SARSCOV2NAA NEGATIVE 02/23/2020   SARSCOV2NAA NEGATIVE 01/27/2020    Microbiology  Recent Results (from the past 240 hour(s))  Culture, blood (Routine x 2)     Status: None   Collection Time: 03/28/21 12:55 PM   Specimen: BLOOD RIGHT WRIST  Result Value Ref Range Status   Specimen Description   Final    BLOOD RIGHT WRIST Performed at [facility], [address] [place], [place] [address]    Special Requests   Final    BOTTLES DRAWN AEROBIC AND ANAEROBIC Blood Culture adequate volume Performed at [facility], [address] [place], [place]  [address]    Culture   Final    NO GROWTH 5 DAYS Performed at [facility], [address] [place], [place] [address]    Report Status 04/02/2021 FINAL  Final  Culture, blood (Routine x 2)     Status: Abnormal   Collection Time: 03/28/21  1:00 PM   Specimen: BLOOD  Result Value Ref Range Status   Specimen Description   Final    BLOOD LEFT ARM Performed at [facility], [address], [place], [place] [address]    Special Requests   Final    BOTTLES DRAWN AEROBIC AND ANAEROBIC Blood Culture adequate volume Performed at [facility], [address], [place], [place] [address]    Culture  Setup Time   Final    GRAM NEGATIVE RODS IN BOTH AEROBIC AND ANAEROBIC BOTTLES CRITICAL RESULT CALLED TO, READ BACK BY AND VERIFIED WITH: PHARMD JUSTIN LEGGE  03/29/21 @0949  BY JW Performed at [facility], [address], [place], [place]    Culture ESCHERICHIA COLI (A)  Final   Report Status 04/01/2021 FINAL  Final   Organism ID, Bacteria ESCHERICHIA COLI  Final      Susceptibility   Escherichia coli - MIC*    AMPICILLIN 4 SENSITIVE Sensitive     CEFAZOLIN <=4 SENSITIVE Sensitive     CEFEPIME <=0.12 SENSITIVE Sensitive     CEFTAZIDIME <=1 SENSITIVE Sensitive     CEFTRIAXONE <=0.25 SENSITIVE Sensitive     CIPROFLOXACIN >=4 RESISTANT Resistant     GENTAMICIN <=1 SENSITIVE Sensitive     IMIPENEM <=0.25 SENSITIVE Sensitive     TRIMETH/SULFA >=320 RESISTANT Resistant     AMPICILLIN/SULBACTAM <=2 SENSITIVE Sensitive     PIP/TAZO <=4 SENSITIVE Sensitive     * ESCHERICHIA COLI  Blood Culture ID Panel (Reflexed)  Status: Abnormal   Collection Time: 03/28/21  1:00 PM  Result Value Ref Range Status   Enterococcus faecalis NOT DETECTED NOT DETECTED Final   Enterococcus Faecium NOT DETECTED NOT DETECTED Final   Listeria monocytogenes NOT DETECTED NOT DETECTED Final   Staphylococcus species NOT DETECTED NOT DETECTED Final   Staphylococcus  aureus (BCID) NOT DETECTED NOT DETECTED Final   Staphylococcus epidermidis NOT DETECTED NOT DETECTED Final   Staphylococcus lugdunensis NOT DETECTED NOT DETECTED Final   Streptococcus species NOT DETECTED NOT DETECTED Final   Streptococcus agalactiae NOT DETECTED NOT DETECTED Final   Streptococcus pneumoniae NOT DETECTED NOT DETECTED Final   Streptococcus pyogenes NOT DETECTED NOT DETECTED Final   A.calcoaceticus-baumannii NOT DETECTED NOT DETECTED Final   Bacteroides fragilis NOT DETECTED NOT DETECTED Final   Enterobacterales DETECTED (A) NOT DETECTED Final    Comment: Enterobacterales represent a large order of gram negative bacteria, not a single organism. CRITICAL RESULT CALLED TO, READ BACK BY AND VERIFIED WITH: PHARMD JUSTIN LEGG 03/29/21 @0951  BY JW    Enterobacter cloacae complex NOT DETECTED NOT DETECTED Final   Escherichia coli DETECTED (A) NOT DETECTED Final    Comment: CRITICAL RESULT CALLED TO, READ BACK BY AND VERIFIED WITH: PHARMD JUSTIN LEGG 03/29/21 @0951  BY JW    Klebsiella aerogenes NOT DETECTED NOT DETECTED Final   Klebsiella oxytoca NOT DETECTED NOT DETECTED Final   Klebsiella pneumoniae NOT DETECTED NOT DETECTED Final   Proteus species NOT DETECTED NOT DETECTED Final   Salmonella species NOT DETECTED NOT DETECTED Final   Serratia marcescens NOT DETECTED NOT DETECTED Final   Haemophilus influenzae NOT DETECTED NOT DETECTED Final   Neisseria meningitidis NOT DETECTED NOT DETECTED Final   Pseudomonas aeruginosa NOT DETECTED NOT DETECTED Final   Stenotrophomonas maltophilia NOT DETECTED NOT DETECTED Final   Candida albicans NOT DETECTED NOT DETECTED Final   Candida auris NOT DETECTED NOT DETECTED Final   Candida glabrata NOT DETECTED NOT DETECTED Final   Candida krusei NOT DETECTED NOT DETECTED Final   Candida parapsilosis NOT DETECTED NOT DETECTED Final   Candida tropicalis NOT DETECTED NOT DETECTED Final   Cryptococcus neoformans/gattii NOT DETECTED NOT DETECTED  Final   CTX-M ESBL NOT DETECTED NOT DETECTED Final   Carbapenem resistance IMP NOT DETECTED NOT DETECTED Final   Carbapenem resistance KPC NOT DETECTED NOT DETECTED Final   Carbapenem resistance NDM NOT DETECTED NOT DETECTED Final   Carbapenem resist OXA 48 LIKE NOT DETECTED NOT DETECTED Final   Carbapenem resistance VIM NOT DETECTED NOT DETECTED Final    Comment: Performed at [facility], [address], [place], [address] [place]  SARS CORONAVIRUS 2 (TAT 6-24 HRS) Nasopharyngeal Nasopharyngeal Swab     Status: None   Collection Time: 03/28/21  2:47 PM   Specimen: Nasopharyngeal Swab  Result Value Ref Range Status   SARS Coronavirus 2 NEGATIVE NEGATIVE Final    Comment: (NOTE) SARS-CoV-2 target nucleic acids are NOT DETECTED.  The SARS-CoV-2 RNA is generally detectable in upper and lower respiratory specimens during the acute phase of infection. Negative results do not preclude SARS-CoV-2 infection, do not rule out co-infections with other pathogens, and should not be used as the sole basis for treatment or other patient management decisions. Negative results must be combined with clinical observations, patient history, and epidemiological information. The expected result is Negative.  Fact Sheet for Patients: 48185  Fact Sheet for Healthcare Providers: 03/30/21  This test is not yet approved or cleared by the [employer] FDA  and  has been authorized for detection and/or diagnosis of SARS-CoV-2 by FDA under an Emergency Use Authorization (EUA). This EUA will remain  in effect (meaning this test can be used) for the duration of the COVID-19 declaration under Se ction 564(b)(1) of the Act, 21 U.S.C. section 360bbb-3(b)(1), unless the authorization is terminated or revoked sooner.  Performed at [facility], [address] [place], [place] [address]   MRSA Next Gen by PCR, Nasal     Status:  None   Collection Time: 03/28/21  5:01 PM   Specimen: Nasal Mucosa; Nasal Swab  Result Value Ref Range Status   MRSA by PCR Next Gen NOT DETECTED NOT DETECTED Final    Comment: (NOTE) The GeneXpert MRSA Assay (FDA approved for NASAL specimens only), is one component of a comprehensive MRSA colonization surveillance program. It is not intended to diagnose MRSA infection nor to guide or monitor treatment for MRSA infections. Test performance is not FDA approved in patients less than 21 years old. Performed at [facility], [address] [place], [place] [address]   Urine Culture     Status: Abnormal   Collection Time: 03/28/21  9:49 PM   Specimen: Urine, Catheterized  Result Value Ref Range Status   Specimen Description   Final    URINE, CATHETERIZED Performed at [facility], [address] [place], [place] [address]    Special Requests   Final    NONE Performed at [facility] [address] [place], [place] [address]    Culture MULTIPLE SPECIES PRESENT, SUGGEST RECOLLECTION (A)  Final   Report Status 03/30/2021 FINAL  Final         Meredeth Ide   [facility] If 7PM-7AM, please contact night-coverage at [email or web address], Office  [phone]   04/04/2021, 6:26 PM  LOS: 7 days

## 2021-04-04 NOTE — [place] FL2 (Addendum)
[place] MEDICAID FL2 LEVEL OF CARE SCREENING TOOL     IDENTIFICATION  Patient Name: Stephanie Carrillo Birthdate: Feb 22, 1956 Sex: female Admission Date (Current Location): 03/28/2021  [place] and [place] Number:  Producer, television/film/video and Address:  [facility],  [address], [place] [address]      Provider Number: [id number]  Attending Physician Name and Address:  Meredeth Ide, MD  Relative Name and Phone Number:  Kara Pacer     [phone], Gary Fleet (Friend)   [phone]    Current Level of Care: Hospital Recommended Level of Care: Skilled Nursing Facility Prior Approval Number:    Date Approved/Denied:   PASRR Number:  [id number]  Discharge Plan: SNF    Current Diagnoses: Patient Active Problem List   Diagnosis Date Noted   E coli bacteremia    Sepsis (HCC) 03/28/2021   Medication withdrawal (HCC) 03/18/2021   Hypertensive urgency 03/18/2021   Accelerated hypertension 03/17/2021   Acute encephalopathy 03/17/2021   AKI (acute kidney injury) (HCC)    Fall    Complicated UTI (urinary tract infection) 10/04/2020   Nausea vomiting and diarrhea    Acute metabolic encephalopathy 02/23/2020   Benzodiazepine withdrawal (HCC) 02/01/2020   Hypokalemia    Tachycardia    Altered mental state 01/27/2020   Anxiety and depression 09/23/2018   Insomnia 09/23/2018   Chronic pain syndrome 09/23/2018    Orientation RESPIRATION BLADDER Height & Weight     Self, Time, Situation, Place  Normal External catheter Weight: 66.3 kg Height:  5\' 7"  (170.2 cm)  BEHAVIORAL SYMPTOMS/MOOD NEUROLOGICAL BOWEL NUTRITION STATUS     (none) Incontinent Diet (see d/c summary)  AMBULATORY STATUS COMMUNICATION OF NEEDS Skin   Extensive Assist Verbally Normal                       Personal Care Assistance Level of Assistance  Bathing, Feeding, Dressing Bathing Assistance: Maximum assistance Feeding assistance: Independent Dressing Assistance: Limited  assistance     Functional Limitations Info  Sight, Hearing, Speech Sight Info: Adequate Hearing Info: Adequate Speech Info: Adequate    SPECIAL CARE FACTORS FREQUENCY  PT (By licensed PT), OT (By licensed OT)     PT Frequency: 5X/W OT Frequency: 5X/W            Contractures Contractures Info: Not present    Additional Factors Info  Code Status, Allergies Code Status Info: full Allergies Info: Cipro, Penicillin           Current Medications (04/04/2021):  This is the current hospital active medication list Current Facility-Administered Medications  Medication Dose Route Frequency Provider Last Rate Last Admin   0.9 %  sodium chloride infusion   Intravenous PRN 06/04/2021 U, DO 10 mL/hr at 04/02/21 1243 Infusion Verify at 04/02/21 1243   acetaminophen (TYLENOL) suppository 650 mg  650 mg Rectal Q6H PRN 04/04/21, MD   650 mg at 03/30/21 1552   amLODipine (NORVASC) tablet 10 mg  10 mg Oral Daily 04/01/21, MD   10 mg at 04/03/21 04/05/21   buprenorphine (SUBUTEX) SL tablet 2 mg  2 mg Sublingual Q6H 9371, MD   2 mg at 04/04/21 06/04/21   Chlorhexidine Gluconate Cloth 2 % PADS 6 each  6 each Topical Daily 6967, MD       clonazepam Meredeth Ide) disintegrating tablet 0.25 mg  0.25 mg Oral TID PRN Scarlette Calico, MD   0.25 mg at 04/03/21  2119   docusate sodium (COLACE) capsule 100 mg  100 mg Oral BID Jerald Kief, MD   100 mg at 04/03/21 8563   enalaprilat (VASOTEC) injection 0.625 mg  0.625 mg Intravenous Once Audrea Muscat T, NP       enoxaparin (LOVENOX) injection 40 mg  40 mg Subcutaneous Q24H Vann, Jessica U, DO   40 mg at 04/03/21 2119   hydrALAZINE (APRESOLINE) injection 10 mg  10 mg Intravenous Q4H PRN Jerald Kief, MD   10 mg at 03/30/21 1402   labetalol (NORMODYNE) injection 5 mg  5 mg Intravenous Q2H PRN Jerald Kief, MD   5 mg at 04/02/21 1128   lisinopril (ZESTRIL) tablet 20 mg  20 mg Oral Daily Jerald Kief, MD   20 mg at 04/03/21  0908   metoprolol tartrate (LOPRESSOR) tablet 100 mg  100 mg Oral BID Jerald Kief, MD   100 mg at 04/03/21 2119   ondansetron (ZOFRAN) tablet 4 mg  4 mg Oral Q6H PRN Joseph Art, DO       Or   ondansetron [facility]) injection 4 mg  4 mg Intravenous Q6H PRN Marlin Canary U, DO   4 mg at 03/30/21 2201   pantoprazole (PROTONIX) EC tablet 40 mg  40 mg Oral Daily Hessie Knows, RPH   40 mg at 04/03/21 1206   polyethylene glycol (MIRALAX / GLYCOLAX) packet 17 g  17 g Oral Daily PRN Jerald Kief, MD       sodium chloride flush (NS) 0.9 % injection 10-40 mL  10-40 mL Intracatheter Q12H Jerald Kief, MD   10 mL at 04/03/21 2120   sodium chloride flush (NS) 0.9 % injection 10-40 mL  10-40 mL Intracatheter PRN Jerald Kief, MD   10 mL at 04/03/21 2120   sodium chloride flush (NS) 0.9 % injection 3 mL  3 mL Intravenous Q12H Vann, Jessica U, DO   3 mL at 04/03/21 2121     Discharge Medications: Please see discharge summary for a list of discharge medications.  Relevant Imaging Results:  Relevant Lab Results:   Additional Information [phone] [place], [place]

## 2021-04-04 NOTE — TOC Progression Note (Addendum)
Transition of Care [facility]) - Progression Note    Patient Details  Name: Stephanie Carrillo MRN: [id number] Date of Birth: 23-Sep-1955  Transition of Care [facility]) CM/SW Contact  Ida Rogue, [place] Phone Number: 04/04/2021, 10:30 AM  Clinical Narrative:   Received call from Ms Mayford Knife stating that after a lengthy conversation among she, brother and patient, [name] is reluctantly agreeable to go for short term rehab.  Attempted to confirm this with patient this AM, who kept her eyes closed, for the most part, and mumbled unintelligibly in response to my questions. RN confirms patient was on phone earlier this AM carrying on normal conversation.  Bed search initiated. TOC will continue to follow during the course of hospitalization.  Addendum: Patient selects. [facility].  Insurance authorization initiated.     Expected Discharge Plan: Skilled Nursing Facility Barriers to Discharge: SNF Pending bed offer  Expected Discharge Plan and Services Expected Discharge Plan: Skilled Nursing Facility   Discharge Planning Services: CM Consult   Living arrangements for the past 2 months: Apartment Expected Discharge Date:  (unknown)                                     Social Determinants of Health (SDOH) Interventions    Readmission Risk Interventions No flowsheet data found.

## 2021-04-05 DIAGNOSIS — F419 Anxiety disorder, unspecified: Secondary | ICD-10-CM | POA: Diagnosis not present

## 2021-04-05 DIAGNOSIS — F411 Generalized anxiety disorder: Secondary | ICD-10-CM | POA: Diagnosis not present

## 2021-04-05 DIAGNOSIS — R7881 Bacteremia: Secondary | ICD-10-CM | POA: Diagnosis not present

## 2021-04-05 DIAGNOSIS — G894 Chronic pain syndrome: Secondary | ICD-10-CM | POA: Diagnosis not present

## 2021-04-05 DIAGNOSIS — M6281 Muscle weakness (generalized): Secondary | ICD-10-CM | POA: Diagnosis not present

## 2021-04-05 DIAGNOSIS — R2689 Other abnormalities of gait and mobility: Secondary | ICD-10-CM | POA: Diagnosis not present

## 2021-04-05 DIAGNOSIS — Z743 Need for continuous supervision: Secondary | ICD-10-CM | POA: Diagnosis not present

## 2021-04-05 DIAGNOSIS — K219 Gastro-esophageal reflux disease without esophagitis: Secondary | ICD-10-CM | POA: Diagnosis not present

## 2021-04-05 DIAGNOSIS — F32A Depression, unspecified: Secondary | ICD-10-CM | POA: Diagnosis not present

## 2021-04-05 DIAGNOSIS — R278 Other lack of coordination: Secondary | ICD-10-CM | POA: Diagnosis not present

## 2021-04-05 DIAGNOSIS — A419 Sepsis, unspecified organism: Secondary | ICD-10-CM | POA: Diagnosis not present

## 2021-04-05 DIAGNOSIS — R531 Weakness: Secondary | ICD-10-CM | POA: Diagnosis not present

## 2021-04-05 DIAGNOSIS — I160 Hypertensive urgency: Secondary | ICD-10-CM | POA: Diagnosis not present

## 2021-04-05 DIAGNOSIS — I10 Essential (primary) hypertension: Secondary | ICD-10-CM | POA: Diagnosis not present

## 2021-04-05 DIAGNOSIS — G9340 Encephalopathy, unspecified: Secondary | ICD-10-CM | POA: Diagnosis not present

## 2021-04-05 DIAGNOSIS — R2681 Unsteadiness on feet: Secondary | ICD-10-CM | POA: Diagnosis not present

## 2021-04-05 DIAGNOSIS — N390 Urinary tract infection, site not specified: Secondary | ICD-10-CM | POA: Diagnosis not present

## 2021-04-05 LAB — RESP PANEL BY RT-PCR (FLU A&B, COVID) ARPGX2
Influenza A by PCR: NEGATIVE
Influenza B by PCR: NEGATIVE
SARS Coronavirus 2 by RT PCR: NEGATIVE

## 2021-04-05 MED ORDER — CLONAZEPAM 0.5 MG PO TABS: 0.5000 mg | ORAL_TABLET | Freq: Three times a day (TID) | ORAL | 0 refills | Status: DC | PRN

## 2021-04-05 MED ORDER — GABAPENTIN 400 MG PO CAPS
800.0000 mg | ORAL_CAPSULE | Freq: Three times a day (TID) | ORAL | Status: DC
  Administered 2021-04-05 (×2): 800 mg via ORAL
  Filled 2021-04-05 (×2): qty 2

## 2021-04-05 MED ORDER — DULOXETINE HCL 30 MG PO CPEP
60.0000 mg | ORAL_CAPSULE | Freq: Every day | ORAL | Status: DC
  Administered 2021-04-05: 60 mg via ORAL
  Filled 2021-04-05: qty 2

## 2021-04-05 MED ORDER — LOPERAMIDE HCL 2 MG PO CAPS
2.0000 mg | ORAL_CAPSULE | ORAL | Status: DC | PRN
  Administered 2021-04-05: 2 mg via ORAL
  Filled 2021-04-05: qty 1

## 2021-04-05 MED ORDER — METOPROLOL TARTRATE 100 MG PO TABS: 100.0000 mg | ORAL_TABLET | Freq: Two times a day (BID) | ORAL | Status: DC

## 2021-04-05 MED ORDER — LISINOPRIL 20 MG PO TABS: 20.0000 mg | ORAL_TABLET | Freq: Every day | ORAL | Status: DC

## 2021-04-05 MED ORDER — ACETAMINOPHEN 500 MG PO TABS: 1000.0000 mg | ORAL_TABLET | Freq: Four times a day (QID) | ORAL | 0 refills | Status: DC | PRN

## 2021-04-05 MED ORDER — BUPRENORPHINE HCL 2 MG SL SUBL: 2.0000 mg | SUBLINGUAL_TABLET | Freq: Four times a day (QID) | SUBLINGUAL | 0 refills | Status: DC

## 2021-04-05 MED ORDER — POLYETHYLENE GLYCOL 3350 17 G PO PACK: 17.0000 g | PACK | Freq: Every day | ORAL | 0 refills | Status: DC | PRN

## 2021-04-05 NOTE — TOC Transition Note (Addendum)
Transition of Care [facility]) - CM/SW Discharge Note   Patient Details  Name: Stephanie Carrillo MRN: [id number] Date of Birth: 1955-12-01  Transition [facility] Care [facility]) CM/SW Contact:  Ida Rogue, LCSW Phone Number: 04/05/2021, 10:28 AM   Clinical Narrative:   Patient who is medically stable for d/c will transfer to [facility] today.  Family informed.  PTAR arranged.  [employer] reference # [id number], 9/2 through 9/6. Nursing, please call report to [phone], rm [phone]. TOC sign off.    Final next level of care: Skilled Nursing Facility Barriers to Discharge: Barriers Resolved   Patient Goals and CMS Choice        Discharge Placement                       Discharge Plan and Services   Discharge Planning Services: CM Consult                                 Social Determinants of Health (SDOH) Interventions     Readmission Risk Interventions No flowsheet data found.

## 2021-04-05 NOTE — Discharge Summary (Addendum)
Physician Discharge Summary  Stephanie Carrillo ZOX:[id number] DOB: [birth year] DOA: 03/28/2021  PCP: Lavada Mesi, MD  Admit date: 03/28/2021 Discharge date: 04/05/2021  Time spent: 60 minutes  Recommendations for Outpatient Follow-up:  Patient to be discharged to skilled nursing facility   Discharge Diagnoses:  Active Problems:   Anxiety and depression   Chronic pain syndrome   Complicated UTI (urinary tract infection)   Acute encephalopathy   Hypertensive urgency   Sepsis (HCC)   E coli bacteremia   Discharge Condition: Stable  Diet recommendation: Heart healthy diet  Filed Weights   03/28/21 1303 03/28/21 1700  Weight: 86.8 kg 66.3 kg    History of present illness:  65 year old female with history of chronic pain syndrome, polypharmacy, hypertension presented with altered mental status, hypertensive urgency.  She was recently discharged on 4/26 with UTI, resistant to Bactrim.  Hospital Course:   Sepsis due to UTI with E. coli bacteremia -Recent urine culture positive for E. coli resistant to Bactrim -Patient is on cefepime, per urine culture results from last UTI -Antibiotics narrowed down to Rocephin -ID was consulted, recommended to transition to cefadroxil to complete 7 days of therapy -Patient completed 7 days of antibiotic treatment on 04/04/2021   Hypertensive urgency -Resolved -Poorly controlled hypertension prior to visit -Now on amlodipine, metoprolol, lisinopril -Blood pressure is well controlled   Acute metabolic encephalopathy -Likely from sepsis -Resolved with antibiotics   Chronic pain syndrome -Recently complained of generalized pain -Continue Suboxone per home regimen   Anxiety/depression -Patient has been on clonazepam prior to admission -Have continued patient on clonazepam at low-dose of 0.25 mg 3 times daily as needed -Continue Cymbalta, gabapentin  Procedures:   Consultations:   Discharge Exam: Vitals:   04/05/21 0521 04/05/21 1222   BP: 125/68 130/67  Pulse: 69 67  Resp: 20 20  Temp: 97.9 F (36.6 C) 98.2 F (36.8 C)  SpO2: 99% 97%    General: Appears in no acute distress Cardiovascular: S1-S2, regular, no murmur auscultated Respiratory: Clear to auscultation bilaterally  Discharge Instructions   Discharge Instructions     Diet - low sodium heart healthy   Complete by: As directed    Increase activity slowly   Complete by: As directed       Allergies as of 04/05/2021       Reactions   Ciprofloxacin Shortness Of Breath, Other (See Comments)   Respiratory arrest   Other Anaphylaxis   Penicillins Shortness Of Breath, Rash   Did it involve swelling of the face/tongue/throat, SOB, or low BP? y Did it involve sudden or severe rash/hives, skin peeling, or any reaction on the inside of your mouth or nose? y Did you need to seek medical attention at a hospital or doctor's office? n When did it last happen?  1985 If all above answers are "NO", may proceed with cephalosporin use. Tolerated Ceftriaxone 10/05/20 - 10/07/20   Oxycodone Other (See Comments)   Patient in recovery process.        Medication List     STOP taking these medications    baclofen 10 MG tablet Commonly known as: LIORESAL   sulfamethoxazole-trimethoprim 800-160 MG tablet Commonly known as: BACTRIM DS   tiZANidine 4 MG tablet Commonly known as: Zanaflex       TAKE these medications    acetaminophen 500 MG tablet Commonly known as: TYLENOL Take 2 tablets (1,000 mg total) by mouth every 6 (six) hours as needed. What changed:  when to take this reasons to take  this   amLODipine 5 MG tablet Commonly known as: NORVASC Take 1 tablet (5 mg total) by mouth daily.   buprenorphine 2 MG Subl SL tablet Commonly known as: SUBUTEX Place 1 tablet (2 mg total) under the tongue every 6 (six) hours.   clonazePAM 0.5 MG tablet Commonly known as: KLONOPIN Take 1 tablet (0.5 mg total) by mouth 3 (three) times daily as needed for  anxiety.   DULoxetine 60 MG capsule Commonly known as: Cymbalta Take 1 capsule (60 mg total) by mouth daily.   gabapentin 800 MG tablet Commonly known as: NEURONTIN Take 800 mg by mouth 3 (three) times daily.   lisinopril 20 MG tablet Commonly known as: ZESTRIL Take 1 tablet (20 mg total) by mouth daily. Start taking on: April 06, 2021   metoprolol tartrate 100 MG tablet Commonly known as: LOPRESSOR Take 1 tablet (100 mg total) by mouth 2 (two) times daily.   pantoprazole 40 MG tablet Commonly known as: PROTONIX Take 1 tablet (40 mg total) by mouth daily at 6 (six) AM.   polyethylene glycol 17 g packet Commonly known as: MIRALAX / GLYCOLAX Take 17 g by mouth daily as needed for mild constipation.       Allergies  Allergen Reactions   Ciprofloxacin Shortness Of Breath and Other (See Comments)    Respiratory arrest   Other Anaphylaxis   Penicillins Shortness Of Breath and Rash    Did it involve swelling of the face/tongue/throat, SOB, or low BP? y Did it involve sudden or severe rash/hives, skin peeling, or any reaction on the inside of your mouth or nose? y Did you need to seek medical attention at a hospital or doctor's office? n When did it last happen?  1985 If all above answers are "NO", may proceed with cephalosporin use.  Tolerated Ceftriaxone 10/05/20 - 10/07/20   Oxycodone Other (See Comments)    Patient in recovery process.    Contact information for after-discharge care     Destination     [facility] .   Service: Skilled Nursing Contact information: [address] [place] [address] [phone]                      The results of significant diagnostics from this hospitalization (including imaging, microbiology, ancillary and laboratory) are listed below for reference.    Significant Diagnostic Studies: DG Chest 2 View  Result Date: 03/28/2021 CLINICAL DATA:  Suspected sepsis,  fever EXAM: CHEST - 2 VIEW COMPARISON:  03/18/2021 FINDINGS: Cardiomegaly. Both lungs are clear. Disc degenerative disease of the thoracic spine. IMPRESSION: Cardiomegaly without acute abnormality of the lungs. Electronically Signed   By: Lauralyn Primes M.D.   On: 03/28/2021 14:58   DG Forearm Right  Result Date: 03/12/2021 CLINICAL DATA:  Fall with pain EXAM: RIGHT FOREARM - 2 VIEW COMPARISON:  None. FINDINGS: There is no evidence of fracture or other focal bone lesions. Soft tissues are unremarkable. IMPRESSION: Negative. Electronically Signed   By: Jasmine Pang M.D.   On: 03/12/2021 20:38   DG Abd 1 View  Result Date: 03/18/2021 CLINICAL DATA:  Nasogastric tube placement. EXAM: ABDOMEN - 1 VIEW COMPARISON:  None. FINDINGS: Feeding tube terminates in the descending duodenum. Linear subsegmental volume loss in the left lung base. IMPRESSION: Feeding tube terminates in the descending duodenum. Electronically Signed   By: Leanna Battles M.D.   On: 03/18/2021 10:42   CT HEAD WO CONTRAST ( )  Result Date:  03/29/2021 CLINICAL DATA:  Mental status change, unknown cause. Fever, sepsis, and confusion. EXAM: CT HEAD WITHOUT CONTRAST TECHNIQUE: Contiguous axial images were obtained from the base of the skull through the vertex without intravenous contrast. COMPARISON:  Head CT and MRI 03/17/2021 FINDINGS: Brain: There is no evidence of an acute infarct, intracranial hemorrhage, mass, midline shift, or extra-axial fluid collection. Slight cerebral atrophy and minimal chronic small vessel ischemia in the periventricular white matter are again noted. Vascular: Calcified atherosclerosis at the skull base. No hyperdense vessel. Skull: No fracture or suspicious osseous lesion. Sinuses/Orbits: Visualized paranasal sinuses and mastoid air cells are clear. Unremarkable orbits. Other: None. IMPRESSION: No evidence of acute intracranial abnormality. Electronically Signed   By: Sebastian Ache M.D.   On: 03/29/2021 13:07    MR BRAIN WO CONTRAST  Result Date: 03/17/2021 CLINICAL DATA:  Delirium EXAM: MRI HEAD WITHOUT CONTRAST TECHNIQUE: Multiplanar, multiecho pulse sequences of the brain and surrounding structures were obtained without intravenous contrast. COMPARISON:  01/29/2020 FINDINGS: Brain: No acute infarct, mass effect or extra-axial collection. No acute or chronic hemorrhage. There is multifocal hyperintense T2-weighted signal within the white matter. Generalized volume loss without a clear lobar predilection. The midline structures are normal. Vascular: Major flow voids are preserved. Skull and upper cervical spine: Normal calvarium and skull base. Visualized upper cervical spine and soft tissues are normal. Sinuses/Orbits:No paranasal sinus fluid levels or advanced mucosal thickening. No mastoid or middle ear effusion. Normal orbits. IMPRESSION: 1. No acute intracranial abnormality. 2. Findings of mild chronic small vessel disease and volume loss. Electronically Signed   By: Deatra Robinson M.D.   On: 03/17/2021 20:37   DG CHEST PORT 1 VIEW  Result Date: 03/18/2021 CLINICAL DATA:  Altered mental status EXAM: PORTABLE CHEST 1 VIEW COMPARISON:  10/04/2020 FINDINGS: Lungs are clear.  No pleural effusion or pneumothorax. The heart is normal in size. Cervical spine fixation hardware. Degenerative changes of the right shoulder. IMPRESSION: No evidence of acute cardiopulmonary disease. Electronically Signed   By: Charline Bills M.D.   On: 03/18/2021 03:43   DG Humerus Right  Result Date: 03/12/2021 CLINICAL DATA:  Fall with pain at the proximal humerus EXAM: RIGHT HUMERUS - 2+ VIEW COMPARISON:  None. FINDINGS: No fracture is seen. Mild amount of calcific tendinitis. Slight inferior positioning of right humeral head probably largely related to positioning. AC joint appears intact IMPRESSION: No acute osseous abnormality Electronically Signed   By: Jasmine Pang M.D.   On: 03/12/2021 20:37   CT HEAD CODE STROKE WO  CONTRAST  Result Date: 03/17/2021 CLINICAL DATA:  Code stroke. Neuro deficit, acute, stroke suspected. Fell 5 days ago. EXAM: CT HEAD WITHOUT CONTRAST TECHNIQUE: Contiguous axial images were obtained from the base of the skull through the vertex without intravenous contrast. COMPARISON:  Head CT 10/04/2020 FINDINGS: Brain: No evidence of old or acute infarction, mass lesion, hemorrhage, hydrocephalus or extra-axial collection. Vascular: No abnormal vascular finding. Skull: Normal Sinuses/Orbits: Clear/normal Other: None ASPECTS (Alberta Stroke Program Early CT Score) - Ganglionic level infarction (caudate, lentiform nuclei, internal capsule, insula, M1-M3 cortex): 7 - Supraganglionic infarction (M4-M6 cortex): 3 Total score (0-10 with 10 being normal): 10 IMPRESSION: 1. Normal head CT. 2. ASPECTS is 10. 3. These results were called by telephone at the time of interpretation on 03/17/2021 at 3:58 pm to provider [facility] , who verbally acknowledged these results. Electronically Signed   By: Paulina Fusi M.D.   On: 03/17/2021 15:59   [place] EKG SITE RITE  Result  Date: 03/29/2021 If [facility] image not attached, placement could not be confirmed due to current cardiac rhythm.   Microbiology: Recent Results (from the past 240 hour(s))  Culture, blood (Routine x 2)     Status: None   Collection Time: 03/28/21 12:55 PM   Specimen: BLOOD RIGHT WRIST  Result Value Ref Range Status   Specimen Description   Final    BLOOD RIGHT WRIST Performed at [facility], [address] [place], [place] [address]    Special Requests   Final    BOTTLES DRAWN AEROBIC AND ANAEROBIC Blood Culture adequate volume Performed at [facility], [address] [place], [place] [address]    Culture   Final    NO GROWTH 5 DAYS Performed at [facility], [address], [place], [place] [address]    Report Status 04/02/2021 FINAL  Final  Culture, blood (Routine x 2)     Status: Abnormal    Collection Time: 03/28/21  1:00 PM   Specimen: BLOOD  Result Value Ref Range Status   Specimen Description   Final    BLOOD LEFT ARM Performed at [facility], [address] [place], [place] [address]    Special Requests   Final    BOTTLES DRAWN AEROBIC AND ANAEROBIC Blood Culture adequate volume Performed at [facility], [address] [place], [place] [address]    Culture  Setup Time   Final    GRAM NEGATIVE RODS IN BOTH AEROBIC AND ANAEROBIC BOTTLES CRITICAL RESULT CALLED TO, READ BACK BY AND VERIFIED WITH: PHARMD JUSTIN LEGGE  03/29/21 @0949  BY JW Performed at [facility], [address], [place], [place]    Culture ESCHERICHIA COLI (A)  Final   Report Status 04/01/2021 FINAL  Final   Organism ID, Bacteria ESCHERICHIA COLI  Final      Susceptibility   Escherichia coli - MIC*    AMPICILLIN 4 SENSITIVE Sensitive     CEFAZOLIN <=4 SENSITIVE Sensitive     CEFEPIME <=0.12 SENSITIVE Sensitive     CEFTAZIDIME <=1 SENSITIVE Sensitive     CEFTRIAXONE <=0.25 SENSITIVE Sensitive     CIPROFLOXACIN >=4 RESISTANT Resistant     GENTAMICIN <=1 SENSITIVE Sensitive     IMIPENEM <=0.25 SENSITIVE Sensitive     TRIMETH/SULFA >=320 RESISTANT Resistant     AMPICILLIN/SULBACTAM <=2 SENSITIVE Sensitive     PIP/TAZO <=4 SENSITIVE Sensitive     * ESCHERICHIA COLI  Blood Culture ID Panel (Reflexed)     Status: Abnormal   Collection Time: 03/28/21  1:00 PM  Result Value Ref Range Status   Enterococcus faecalis NOT DETECTED NOT DETECTED Final   Enterococcus Faecium NOT DETECTED NOT DETECTED Final   Listeria monocytogenes NOT DETECTED NOT DETECTED Final   Staphylococcus species NOT DETECTED NOT DETECTED Final   Staphylococcus aureus (BCID) NOT DETECTED NOT DETECTED Final   Staphylococcus epidermidis NOT DETECTED NOT DETECTED Final   Staphylococcus lugdunensis NOT DETECTED NOT DETECTED Final   Streptococcus species NOT DETECTED NOT DETECTED  Final   Streptococcus agalactiae NOT DETECTED NOT DETECTED Final   Streptococcus pneumoniae NOT DETECTED NOT DETECTED Final   Streptococcus pyogenes NOT DETECTED NOT DETECTED Final   A.calcoaceticus-baumannii NOT DETECTED NOT DETECTED Final   Bacteroides fragilis NOT DETECTED NOT DETECTED Final   Enterobacterales DETECTED (A) NOT DETECTED Final    Comment: Enterobacterales represent a large order of gram negative bacteria, not a single organism. CRITICAL RESULT  CALLED TO, READ BACK BY AND VERIFIED WITH: PHARMD JUSTIN LEGG 03/29/21 @0951  BY JW    Enterobacter cloacae complex NOT DETECTED NOT DETECTED Final   Escherichia coli DETECTED (A) NOT DETECTED Final    Comment: CRITICAL RESULT CALLED TO, READ BACK BY AND VERIFIED WITH: PHARMD JUSTIN LEGG 03/29/21 @0951  BY JW    Klebsiella aerogenes NOT DETECTED NOT DETECTED Final   Klebsiella oxytoca NOT DETECTED NOT DETECTED Final   Klebsiella pneumoniae NOT DETECTED NOT DETECTED Final   Proteus species NOT DETECTED NOT DETECTED Final   Salmonella species NOT DETECTED NOT DETECTED Final   Serratia marcescens NOT DETECTED NOT DETECTED Final   Haemophilus influenzae NOT DETECTED NOT DETECTED Final   Neisseria meningitidis NOT DETECTED NOT DETECTED Final   Pseudomonas aeruginosa NOT DETECTED NOT DETECTED Final   Stenotrophomonas maltophilia NOT DETECTED NOT DETECTED Final   Candida albicans NOT DETECTED NOT DETECTED Final   Candida auris NOT DETECTED NOT DETECTED Final   Candida glabrata NOT DETECTED NOT DETECTED Final   Candida krusei NOT DETECTED NOT DETECTED Final   Candida parapsilosis NOT DETECTED NOT DETECTED Final   Candida tropicalis NOT DETECTED NOT DETECTED Final   Cryptococcus neoformans/gattii NOT DETECTED NOT DETECTED Final   CTX-M ESBL NOT DETECTED NOT DETECTED Final   Carbapenem resistance IMP NOT DETECTED NOT DETECTED Final   Carbapenem resistance KPC NOT DETECTED NOT DETECTED Final   Carbapenem resistance NDM NOT DETECTED NOT  DETECTED Final   Carbapenem resist OXA 48 LIKE NOT DETECTED NOT DETECTED Final   Carbapenem resistance VIM NOT DETECTED NOT DETECTED Final    Comment: Performed at [facility], [address] [place], [address] [place]  SARS CORONAVIRUS 2 (TAT 6-24 HRS) Nasopharyngeal Nasopharyngeal Swab     Status: None   Collection Time: 03/28/21  2:47 PM   Specimen: Nasopharyngeal Swab  Result Value Ref Range Status   SARS Coronavirus 2 NEGATIVE NEGATIVE Final    Comment: (NOTE) SARS-CoV-2 target nucleic acids are NOT DETECTED.  The SARS-CoV-2 RNA is generally detectable in upper and lower respiratory specimens during the acute phase of infection. Negative results do not preclude SARS-CoV-2 infection, do not rule out co-infections with other pathogens, and should not be used as the sole basis for treatment or other patient management decisions. Negative results must be combined with clinical observations, patient history, and epidemiological information. The expected result is Negative.  Fact Sheet for Patients: 70962  Fact Sheet for Healthcare Providers: 03/30/21  This test is not yet approved or cleared by the [place] FDA and  has been authorized for detection and/or diagnosis of SARS-CoV-2 by FDA under an Emergency Use Authorization (EUA). This EUA will remain  in effect (meaning this test can be used) for the duration of the COVID-19 declaration under Se ction 564(b)(1) of the Act, 21 U.S.C. section 360bbb-3(b)(1), unless the authorization is terminated or revoked sooner.  Performed at [facility], [address] [place], [address] [place]   MRSA Next Gen by PCR, Nasal     Status: None   Collection Time: 03/28/21  5:01 PM   Specimen: Nasal Mucosa; Nasal Swab  Result Value Ref Range Status   MRSA by PCR Next Gen NOT DETECTED NOT DETECTED Final    Comment: (NOTE) The GeneXpert MRSA Assay (FDA  approved for NASAL specimens only), is one component of a comprehensive MRSA colonization surveillance program. It is not intended to diagnose MRSA infection nor to guide or monitor treatment for MRSA  infections. Test performance is not FDA approved in patients less than 41 years old. Performed at [facility], [address] [place], [place] [address]   Urine Culture     Status: Abnormal   Collection Time: 03/28/21  9:49 PM   Specimen: Urine, Catheterized  Result Value Ref Range Status   Specimen Description   Final    URINE, CATHETERIZED Performed at [facility], [address] [place], [place] [address]    Special Requests   Final    NONE Performed at [facility], [address], [place], [place] [address]    Culture MULTIPLE SPECIES PRESENT, SUGGEST RECOLLECTION (A)  Final   Report Status 03/30/2021 FINAL  Final  Resp Panel by RT-PCR (Flu A&B, Covid) Nasopharyngeal Swab     Status: None   Collection Time: 04/05/21  9:17 AM   Specimen: Nasopharyngeal Swab; Nasopharyngeal(NP) swabs in vial transport medium  Result Value Ref Range Status   SARS Coronavirus 2 by RT PCR NEGATIVE NEGATIVE Final    Comment: (NOTE) SARS-CoV-2 target nucleic acids are NOT DETECTED.  The SARS-CoV-2 RNA is generally detectable in upper respiratory specimens during the acute phase of infection. The lowest concentration of SARS-CoV-2 viral copies this assay can detect is 138 copies/mL. A negative result does not preclude SARS-Cov-2 infection and should not be used as the sole basis for treatment or other patient management decisions. A negative result may occur with  improper specimen collection/handling, submission of specimen other than nasopharyngeal swab, presence of viral mutation(s) within the areas targeted by this assay, and inadequate number of viral copies(<138 copies/mL). A negative result must be combined with clinical  observations, patient history, and epidemiological information. The expected result is Negative.  Fact Sheet for Patients:  BloggerCourse.com  Fact Sheet for Healthcare Providers:  SeriousBroker.it  This test is no t yet approved or cleared by the [place] FDA and  has been authorized for detection and/or diagnosis of SARS-CoV-2 by FDA under an Emergency Use Authorization (EUA). This EUA will remain  in effect (meaning this test can be used) for the duration of the COVID-19 declaration under Section 564(b)(1) of the Act, 21 U.S.C.section 360bbb-3(b)(1), unless the authorization is terminated  or revoked sooner.       Influenza A by PCR NEGATIVE NEGATIVE Final   Influenza B by PCR NEGATIVE NEGATIVE Final    Comment: (NOTE) The Xpert Xpress SARS-CoV-2/FLU/RSV plus assay is intended as an aid in the diagnosis of influenza from Nasopharyngeal swab specimens and should not be used as a sole basis for treatment. Nasal washings and aspirates are unacceptable for Xpert Xpress SARS-CoV-2/FLU/RSV testing.  Fact Sheet for Patients: BloggerCourse.com  Fact Sheet for Healthcare Providers: SeriousBroker.it  This test is not yet approved or cleared by the [place] FDA and has been authorized for detection and/or diagnosis of SARS-CoV-2 by FDA under an Emergency Use Authorization (EUA). This EUA will remain in effect (meaning this test can be used) for the duration of the COVID-19 declaration under Section 564(b)(1) of the Act, 21 U.S.C. section 360bbb-3(b)(1), unless the authorization is terminated or revoked.  Performed at [facility], [address], [place], [place] [address]      Labs: Basic Metabolic Panel: Recent Labs  Lab 03/30/21 0431 03/31/21 0527 04/01/21 0459 04/02/21 0801 04/03/21 0311 04/04/21 0415  NA 139 144 137 131* 140  --   K 3.5  2.7* 2.9* 3.2* 3.8  --   CL 99  106 101 94* 101  --   CO2 28 26 28 29 30   --   GLUCOSE 126* 97 85 95 113*  --   BUN 19 20 15 11 11   --   CREATININE 0.72 0.49 0.39* 0.46 0.45 0.52  CALCIUM 9.3 8.2* 7.7* 8.5* 9.2  --   MG 2.2  --  1.5* 1.8  --   --    Liver Function Tests: Recent Labs  Lab 03/30/21 0431 03/31/21 0527 04/01/21 0459 04/02/21 0801 04/03/21 0311  AST 24 18 21 21 22   ALT 21 18 21 22 24   ALKPHOS 103 78 62 76 78  BILITOT 0.7 0.9 0.7 0.8 0.6  PROT 8.0 6.9 6.0* 6.9 7.5  ALBUMIN 3.2* 2.7* 2.4* 2.8* 3.2*   No results for input(s): LIPASE, AMYLASE in the last 168 hours. No results for input(s): AMMONIA in the last 168 hours. CBC: Recent Labs  Lab 03/29/21 1629 03/30/21 0431 03/31/21 0527 04/02/21 0801 04/03/21 0311  WBC  --  19.5* 12.1* 7.8 7.9  HGB 12.8 13.5 12.0 12.4 13.0  HCT 38.6 41.9 38.0 36.9 40.3  MCV  --  97.9 100.5* 97.6 98.8  PLT  --  336 275 279 306   Cardiac Enzymes: No results for input(s): CKTOTAL, CKMB, CKMBINDEX, TROPONINI in the last 168 hours. BNP: BNP (last 3 results) No results for input(s): BNP in the last 8760 hours.  ProBNP (last 3 results) No results for input(s): PROBNP in the last 8760 hours.  CBG: No results for input(s): GLUCAP in the last 168 hours.     Signed:  Meredeth IdeGagan S Kyon Bentler MD.  [facility] 04/05/2021, 2:53 PM

## 2021-04-05 NOTE — Progress Notes (Signed)
Transport here to get pt at this time. All personal belongings packed and sent with pt.

## 2021-04-09 DIAGNOSIS — G894 Chronic pain syndrome: Secondary | ICD-10-CM | POA: Diagnosis not present

## 2021-04-09 DIAGNOSIS — I10 Essential (primary) hypertension: Secondary | ICD-10-CM | POA: Diagnosis not present

## 2021-04-09 DIAGNOSIS — K219 Gastro-esophageal reflux disease without esophagitis: Secondary | ICD-10-CM | POA: Diagnosis not present

## 2021-04-09 DIAGNOSIS — N390 Urinary tract infection, site not specified: Secondary | ICD-10-CM | POA: Diagnosis not present

## 2021-04-10 DIAGNOSIS — F411 Generalized anxiety disorder: Secondary | ICD-10-CM | POA: Diagnosis not present

## 2021-04-10 DIAGNOSIS — A419 Sepsis, unspecified organism: Secondary | ICD-10-CM | POA: Diagnosis not present

## 2021-04-10 DIAGNOSIS — N390 Urinary tract infection, site not specified: Secondary | ICD-10-CM | POA: Diagnosis not present

## 2021-04-10 DIAGNOSIS — I160 Hypertensive urgency: Secondary | ICD-10-CM | POA: Diagnosis not present

## 2021-04-15 ENCOUNTER — Telehealth: Payer: Self-pay | Admitting: Radiology

## 2021-04-15 ENCOUNTER — Other Ambulatory Visit: Payer: Self-pay | Admitting: Radiology

## 2021-04-15 DIAGNOSIS — F419 Anxiety disorder, unspecified: Secondary | ICD-10-CM | POA: Diagnosis not present

## 2021-04-15 DIAGNOSIS — G894 Chronic pain syndrome: Secondary | ICD-10-CM | POA: Diagnosis not present

## 2021-04-15 DIAGNOSIS — F32A Depression, unspecified: Secondary | ICD-10-CM | POA: Diagnosis not present

## 2021-04-15 DIAGNOSIS — R7881 Bacteremia: Secondary | ICD-10-CM | POA: Diagnosis not present

## 2021-04-15 MED ORDER — CLONAZEPAM 0.5 MG PO TABS: 0.5000 mg | ORAL_TABLET | Freq: Three times a day (TID) | ORAL | 1 refills | Status: DC | PRN

## 2021-04-15 MED ORDER — CLONAZEPAM 0.5 MG PO TABS
0.5000 mg | ORAL_TABLET | Freq: Three times a day (TID) | ORAL | 1 refills | Status: DC | PRN
Start: 2021-04-15 — End: 2024-05-18

## 2021-04-15 NOTE — Telephone Encounter (Signed)
Refill request on Dr. Hilts patient. Sending to you as you are on practice call. Thanks. 

## 2021-04-15 NOTE — Telephone Encounter (Signed)
Tried to send in refill with Clonazepam 0.5mg  but it kept sending back to print. I called to pharmacy. Also left message that this would be last 2 months filled from this practice and she will need to get additional refills from new PCP. I left voicemail for patient advising.

## 2021-04-19 DIAGNOSIS — F32A Depression, unspecified: Secondary | ICD-10-CM | POA: Diagnosis not present

## 2021-04-19 DIAGNOSIS — F419 Anxiety disorder, unspecified: Secondary | ICD-10-CM | POA: Diagnosis not present

## 2021-04-19 DIAGNOSIS — G894 Chronic pain syndrome: Secondary | ICD-10-CM | POA: Diagnosis not present

## 2021-04-19 DIAGNOSIS — R7881 Bacteremia: Secondary | ICD-10-CM | POA: Diagnosis not present

## 2021-04-20 DIAGNOSIS — G894 Chronic pain syndrome: Secondary | ICD-10-CM | POA: Diagnosis not present

## 2021-04-20 DIAGNOSIS — I10 Essential (primary) hypertension: Secondary | ICD-10-CM | POA: Diagnosis not present

## 2021-04-20 DIAGNOSIS — F419 Anxiety disorder, unspecified: Secondary | ICD-10-CM | POA: Diagnosis not present

## 2021-04-20 DIAGNOSIS — Z87440 Personal history of urinary (tract) infections: Secondary | ICD-10-CM | POA: Diagnosis not present

## 2021-04-20 DIAGNOSIS — F32A Depression, unspecified: Secondary | ICD-10-CM | POA: Diagnosis not present

## 2021-04-20 DIAGNOSIS — Z9181 History of falling: Secondary | ICD-10-CM | POA: Diagnosis not present

## 2021-04-20 DIAGNOSIS — G4700 Insomnia, unspecified: Secondary | ICD-10-CM | POA: Diagnosis not present

## 2021-04-20 DIAGNOSIS — Z87891 Personal history of nicotine dependence: Secondary | ICD-10-CM | POA: Diagnosis not present

## 2021-04-20 DIAGNOSIS — Z79899 Other long term (current) drug therapy: Secondary | ICD-10-CM | POA: Diagnosis not present

## 2021-04-20 DIAGNOSIS — S46009D Unspecified injury of muscle(s) and tendon(s) of the rotator cuff of unspecified shoulder, subsequent encounter: Secondary | ICD-10-CM | POA: Diagnosis not present

## 2021-04-25 ENCOUNTER — Telehealth: Payer: Self-pay | Admitting: Orthopaedic Surgery

## 2021-04-25 NOTE — Telephone Encounter (Signed)
Called patient left message to return call to reschedule appointment with Dr Roda Shutters for MRI review

## 2021-04-25 NOTE — Telephone Encounter (Signed)
Received call from [facility] with [facility] needing HHOT orders for 1 Wk 8. The number to contact [facility] is [phone]

## 2021-04-26 ENCOUNTER — Other Ambulatory Visit: Payer: Self-pay

## 2021-04-26 NOTE — Telephone Encounter (Signed)
Verbal orders given  

## 2021-04-27 ENCOUNTER — Ambulatory Visit
Admission: RE | Admit: 2021-04-27 | Discharge: 2021-04-27 | Disposition: A | Discharge status: Home / Self Care | Payer: Medicare Other | Source: Ambulatory Visit | Attending: Orthopedic Surgery | Admitting: Orthopedic Surgery

## 2021-04-27 ENCOUNTER — Other Ambulatory Visit: Payer: Self-pay

## 2021-04-27 ENCOUNTER — Ambulatory Visit
Admission: RE | Admit: 2021-04-27 | Discharge: 2021-04-27 | Disposition: A | Discharge status: Home / Self Care | Payer: Medicare Other | Source: Ambulatory Visit | Attending: Family Medicine | Admitting: Family Medicine

## 2021-04-27 DIAGNOSIS — M25511 Pain in right shoulder: Secondary | ICD-10-CM | POA: Diagnosis not present

## 2021-04-27 DIAGNOSIS — M25531 Pain in right wrist: Secondary | ICD-10-CM

## 2021-04-27 IMAGING — MR MR SHOULDER*R* W/O CM
4 of 5 series · 26 of 40 positions shown · non-contrast
Comparison: Right shoulder x-rays dated [DATE].

CLINICAL DATA: Right shoulder pain and weakness since fall 2 months
ago.

EXAM:
MRI OF THE RIGHT SHOULDER WITHOUT CONTRAST
TECHNIQUE: Multiplanar, multisequence MR imaging of the shoulder was performed.
No intravenous contrast was administered.

[Series 5: PD · oblique · 4.0mm · 0.27mm/px · 7 of 18 slices shown]
[im 1/18]
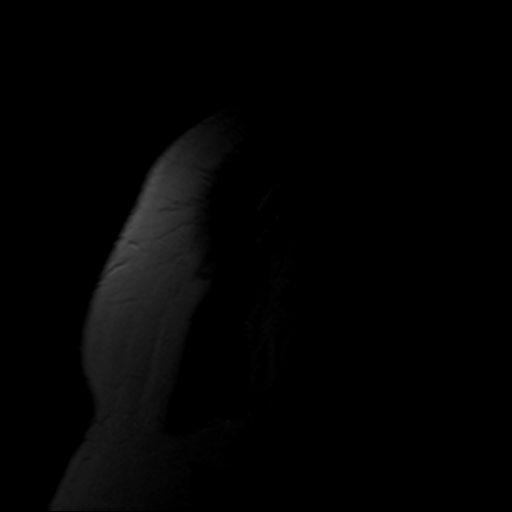
[im 3/18]
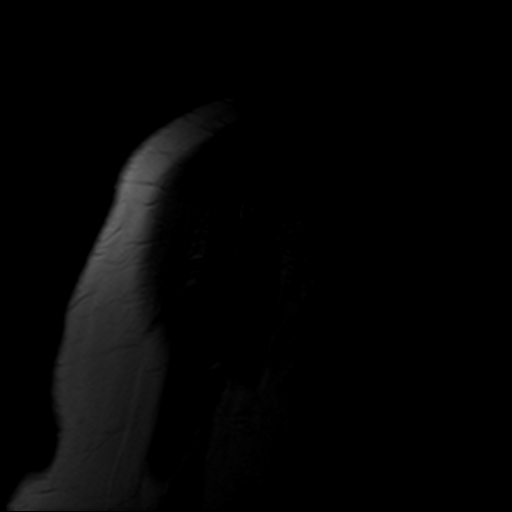
[im 6/18]
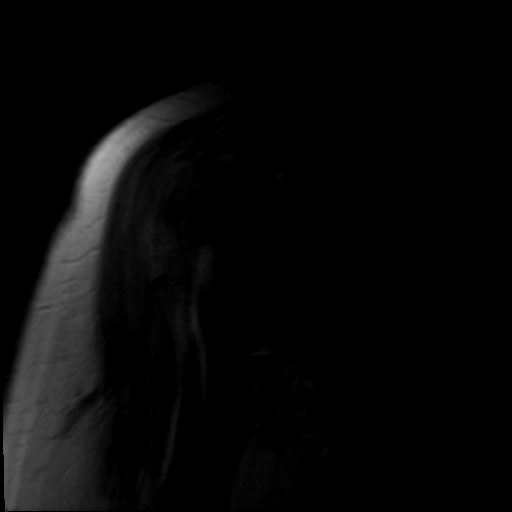
[im 9/18]
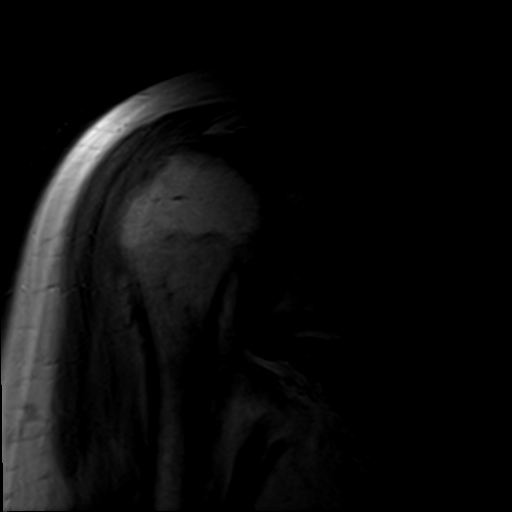
[im 12/18]
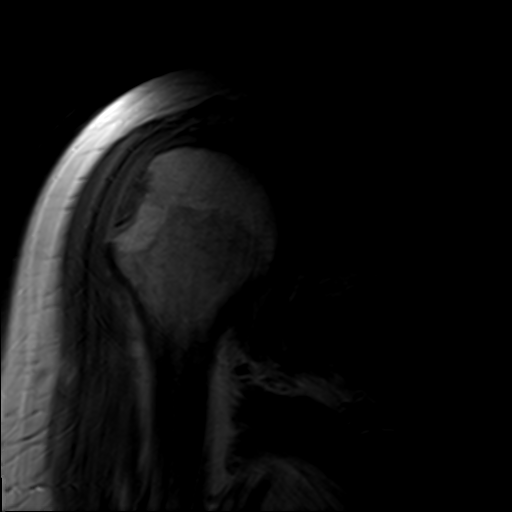
[im 15/18]
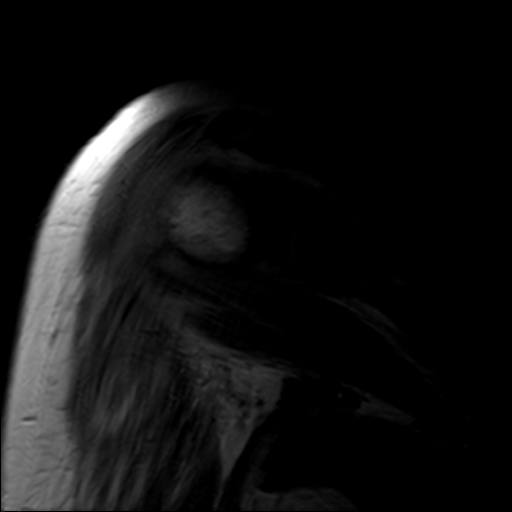
[im 18/18]
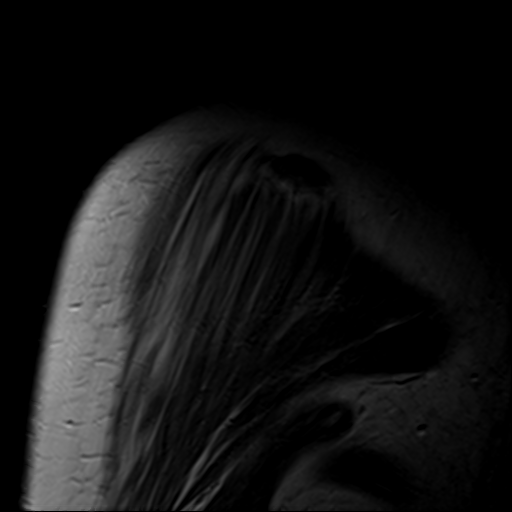

[Series 6: T2 fat-sat · oblique · 4.0mm · 0.55mm/px · 7 of 19 slices shown (1 of 3)]
[im 1/19]
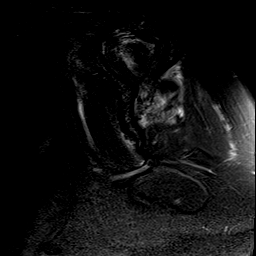
[im 4/19]
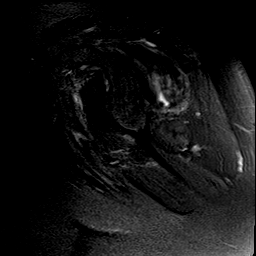
[im 7/19]
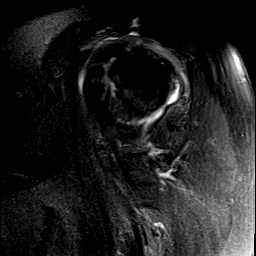
[im 10/19]
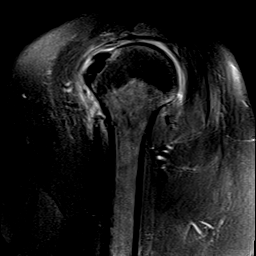
[im 13/19]
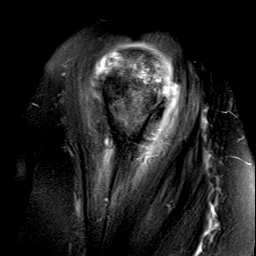
[im 16/19]
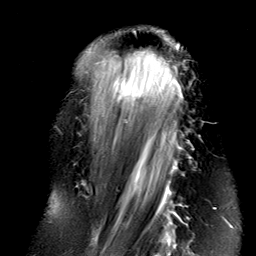
[im 19/19]
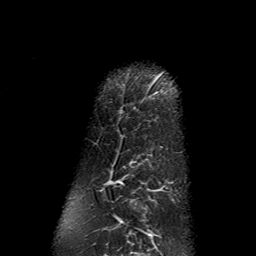

[Series 8: T2 fat-sat · axial · 4.0mm · 0.27mm/px · z∈[-56,+81]mm · 9 of 30 slices shown (2 of 3)]
[im 1/30]
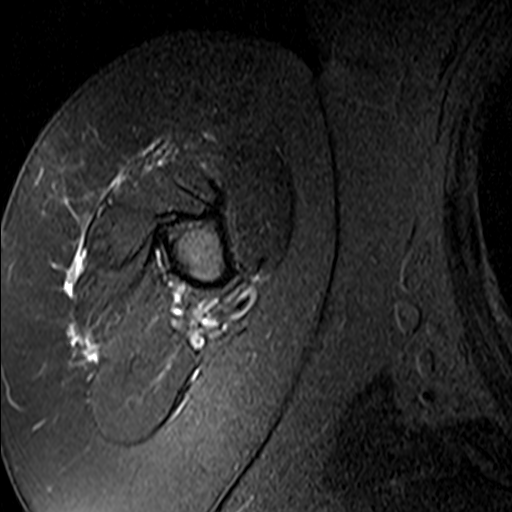
[im 6/30]
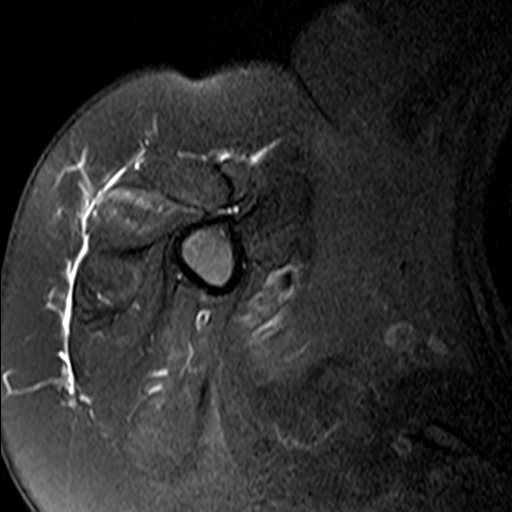
[im 8/30]
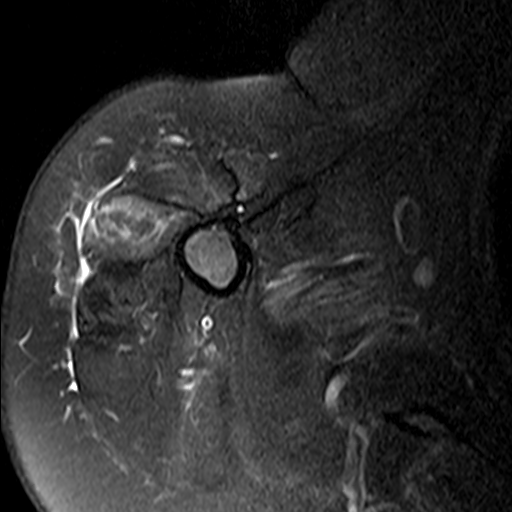
[im 14/30]
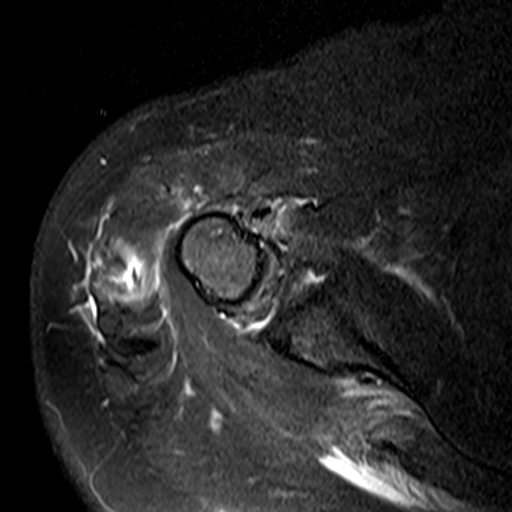
[im 16/30]
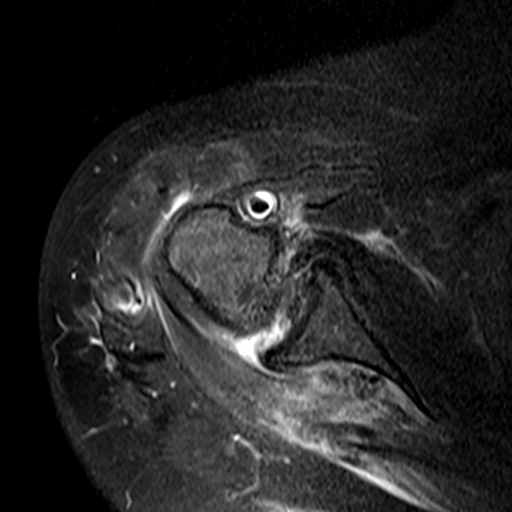
[im 22/30]
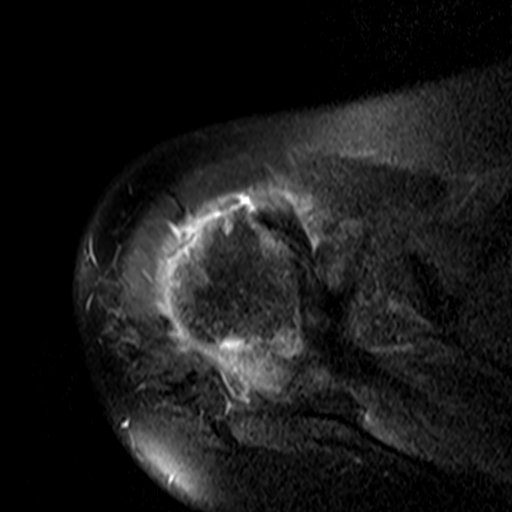
[im 24/30]
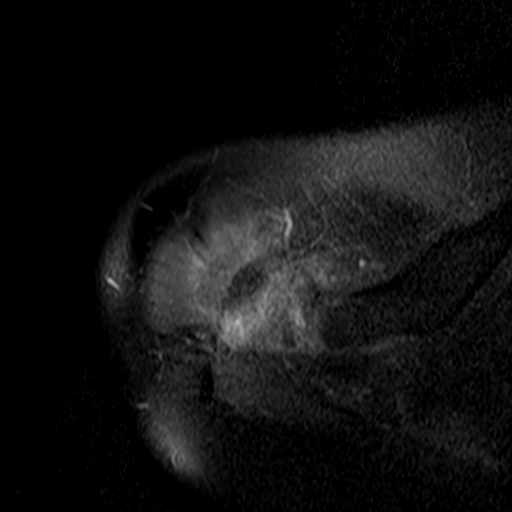
[im 27/30]
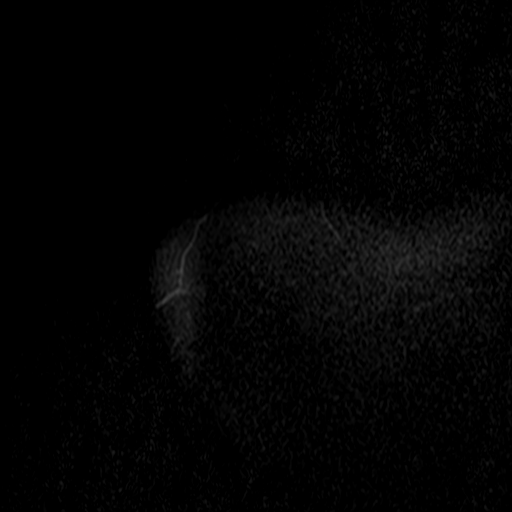
[im 30/30]
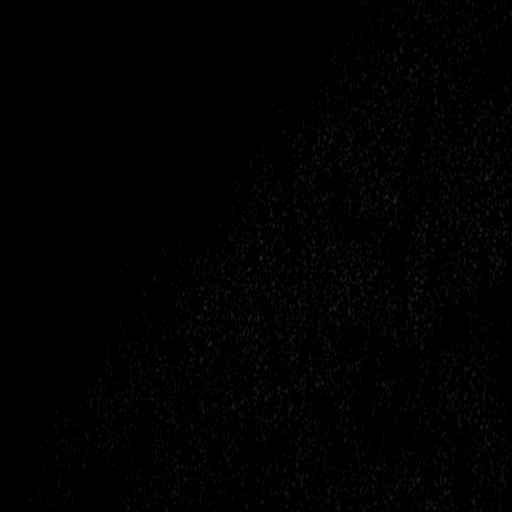

[Series 9: T2 fat-sat · oblique · 4.0mm · 0.55mm/px · 3 of 18 slices shown (3 of 3)]
[im 3/18]
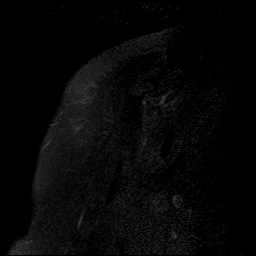
[im 9/18]
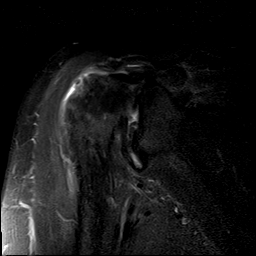
[im 15/18]
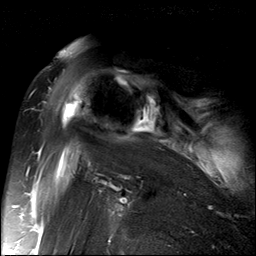

[26 of 40 positions shown; findings below may reference images not displayed]

FINDINGS: Rotator cuff: Full-thickness, full width tears of the supraspinatus
and infraspinatus tendons with retraction to the glenoid. Intact
subscapularis tendon with moderate tendinosis. The teres minor
tendon is unremarkable.

Muscles: Moderate supraspinatus and infraspinatus muscle edema
without significant atrophy. Small partial tear of the lateral
deltoid muscle with associated edema.

Biceps long head:  Intact and normally positioned.

Acromioclavicular Joint: Mild arthropathy of the acromioclavicular
joint. Type II acromion. Small amount of fluid in the
subacromial/subdeltoid bursa.

Glenohumeral Joint: Scattered partial and full-thickness cartilage
loss over the humeral head. No significant joint effusion.

Labrum:  Diffusely degenerated.

Bones: High-riding humeral head. Subacute to chronic impaction
fracture of the greater tuberosity. No dislocation. No suspicious
bone lesion.

Other: None.
IMPRESSION: 1. Full-thickness, full width tears of the supraspinatus and
infraspinatus tendons with retraction to the glenoid. Moderate
muscle edema without significant atrophy.
2. Small partial tear of the lateral deltoid muscle.
3. Subacute to chronic impaction fracture of the greater tuberosity.
4. Moderate subscapularis tendinosis.
5. Mild acromioclavicular and glenohumeral osteoarthritis.

## 2021-04-27 IMAGING — MR MR WRIST*R* W/O CM
9 series · 40 of 40 positions shown · non-contrast
Comparison: Right forearm x-rays dated [DATE].

CLINICAL DATA: Right wrist pain and weakness since fall 2 months
ago.

EXAM:
MR OF THE RIGHT WRIST WITHOUT CONTRAST
TECHNIQUE: Multiplanar, multisequence MR imaging of the right wrist was
performed. No intravenous contrast was administered.

[Series 5: T1 · axial · right · 3.0mm · 0.31mm/px · z∈[-106,-23]mm · 5 of 25 slices shown (1 of 2)]
[im 1/25]
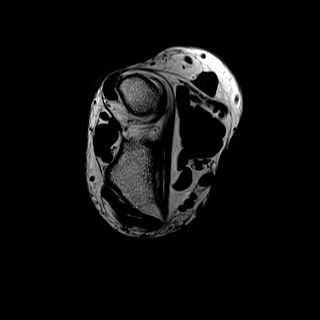
[im 7/25]
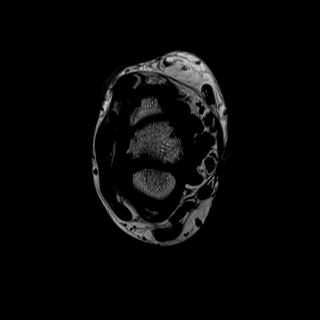
[im 13/25]
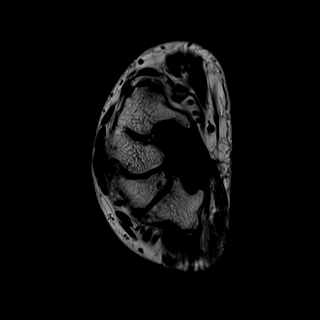
[im 19/25]
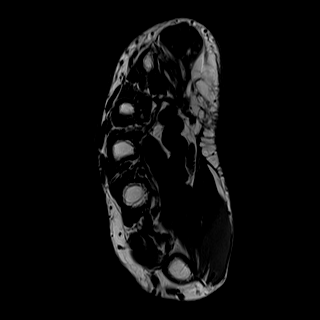
[im 25/25]
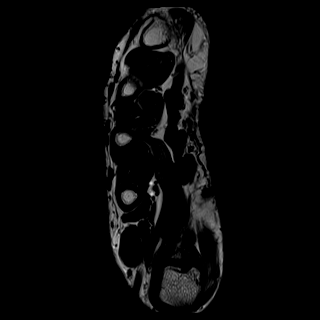

[Series 6: T1 · sagittal · right · 3.0mm · 0.23mm/px · 3 of 13 slices shown (2 of 2)]
[im 1/13]
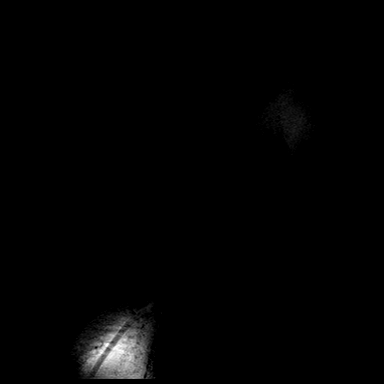
[im 7/13]
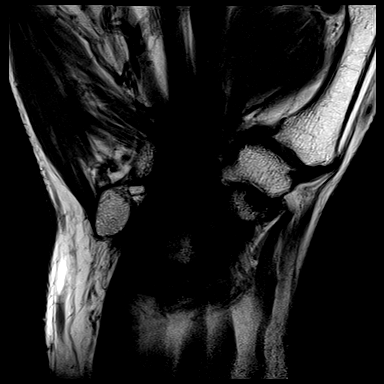
[im 13/13]
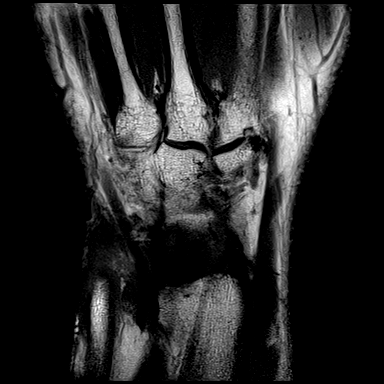

[Series 7: T2 fat-sat · axial · right · 3.0mm · 0.31mm/px · z∈[-106,-23]mm · 6 of 25 slices shown (1 of 3)]
[im 1/25]
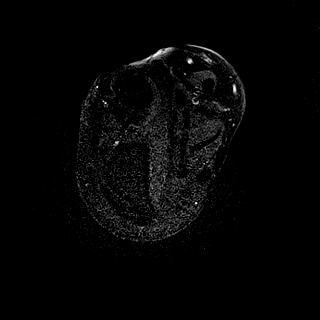
[im 5/25]
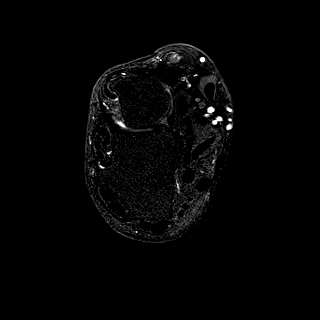
[im 10/25]
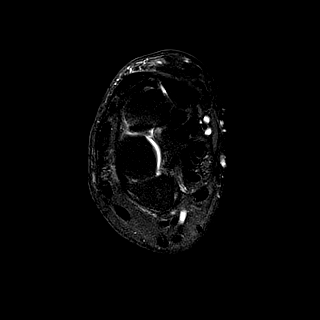
[im 15/25]
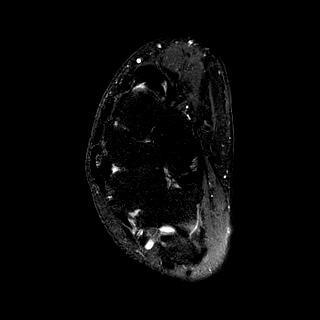
[im 20/25]
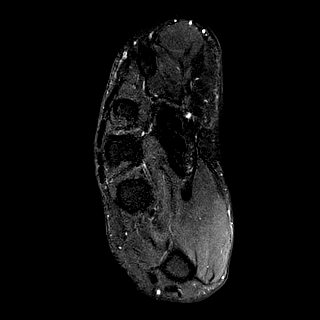
[im 25/25]
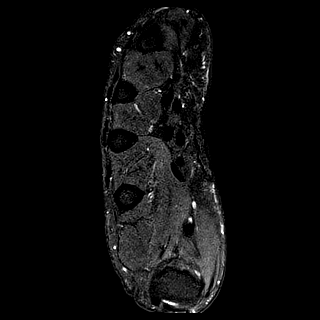

[Series 8: T2 fat-sat · sagittal · right · 3.0mm · 0.31mm/px · 3 of 13 slices shown (2 of 3)]
[im 1/13]
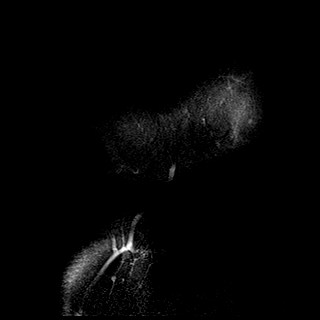
[im 7/13]
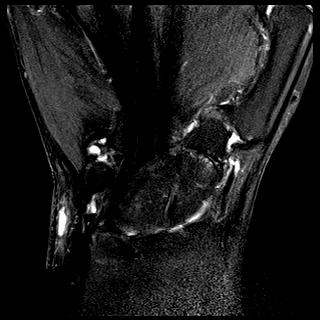
[im 13/13]
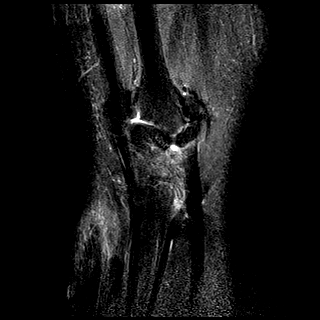

[Series 9: PD fat-sat · coronal · right · 3.0mm · 0.31mm/px · 5 of 20 slices shown (1 of 2)]
[im 1/20]
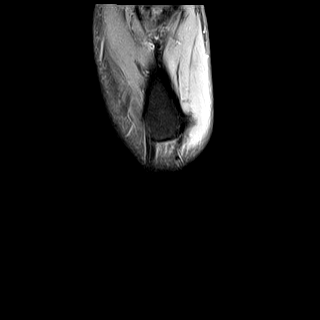
[im 5/20]
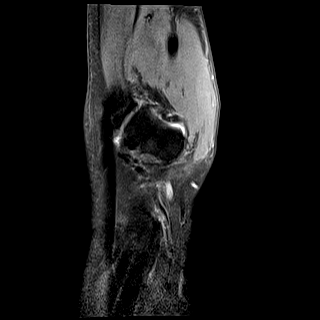
[im 10/20]
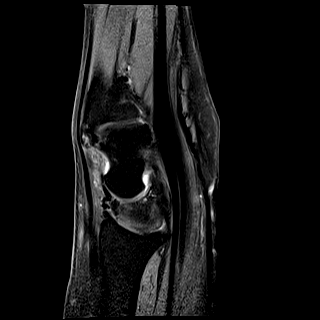
[im 15/20]
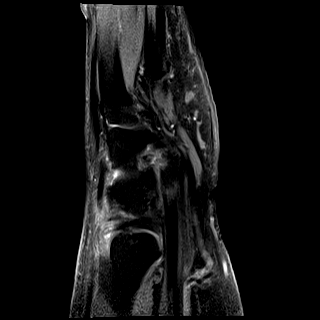
[im 20/20]
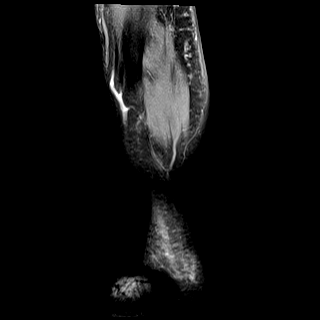

[Series 10: PD fat-sat · sagittal · right · 3.0mm · 0.31mm/px · 4 of 15 slices shown (2 of 2)]
[im 1/15]
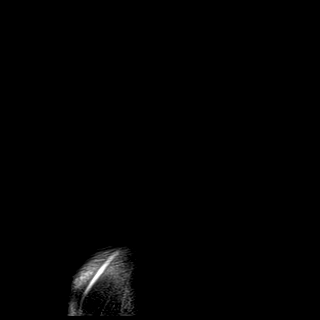
[im 5/15]
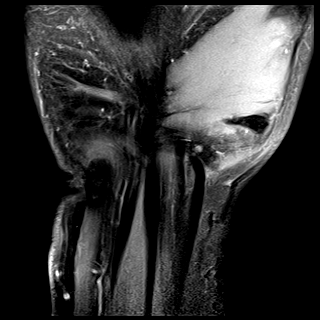
[im 10/15]
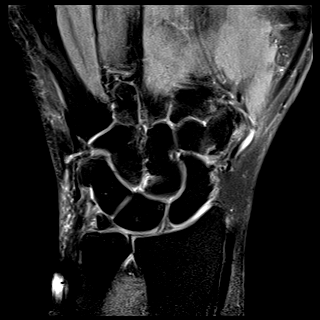
[im 15/15]
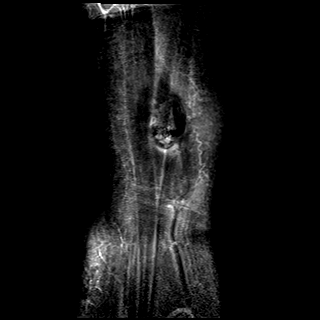

[Series 11: T1 fat-sat · axial · right · 3.0mm · 0.31mm/px · z∈[-106,-23]mm · 6 of 25 slices shown]
[im 1/25]
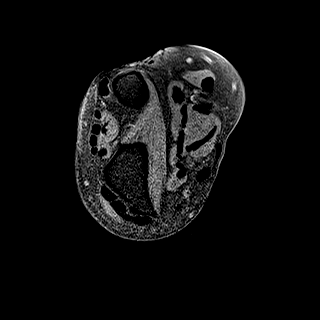
[im 5/25]
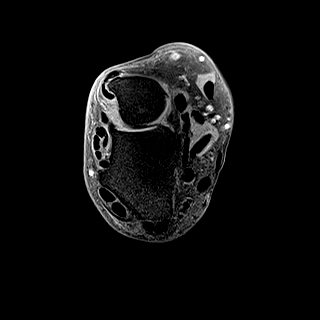
[im 10/25]
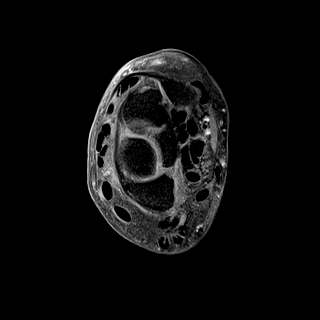
[im 15/25]
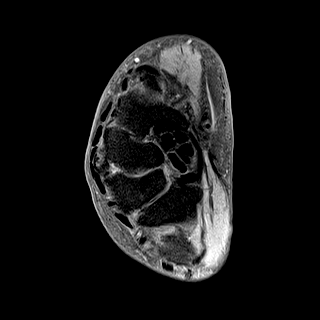
[im 20/25]
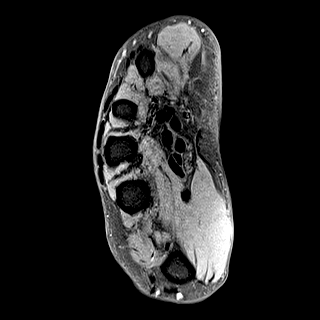
[im 25/25]
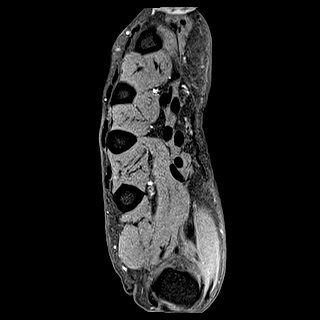

[Series 12: STIR · sagittal · right · 3.0mm · 0.31mm/px · 3 of 14 slices shown]
[im 1/14]
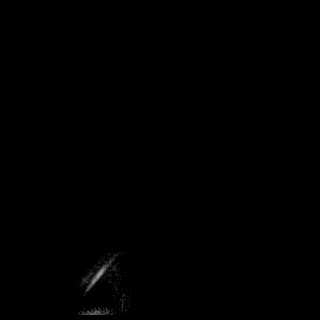
[im 7/14]
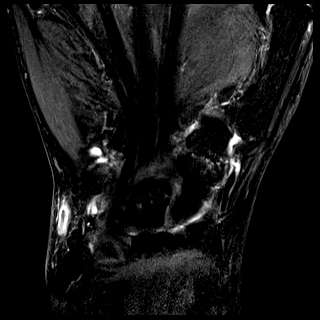
[im 14/14]
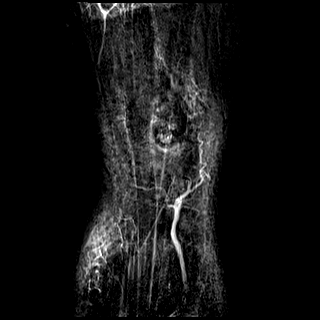

[Series 13: T2 fat-sat · coronal · right · 3.0mm · 0.31mm/px · 5 of 20 slices shown (3 of 3)]
[im 1/20]
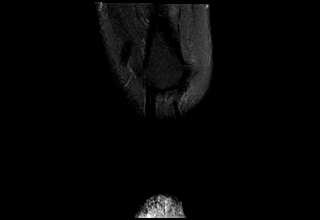
[im 5/20]
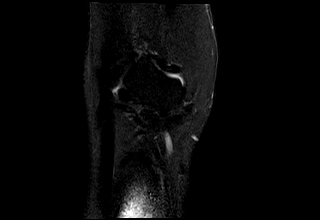
[im 10/20]
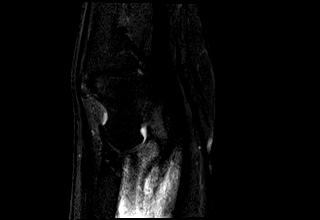
[im 15/20]
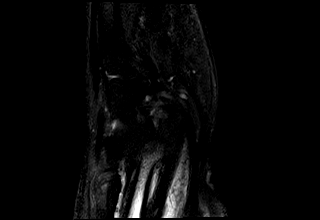
[im 20/20]
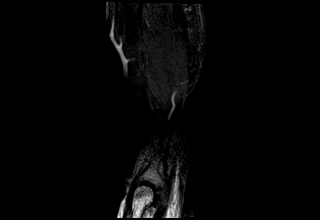

[40 of 40 positions shown; findings below may reference images not displayed]

FINDINGS: Ligaments: Intact scapholunate and lunotriquetral ligaments.

Triangular fibrocartilage: TFCC degeneration with torn articular
disc.

Tendons: Split tear of the extensor carpi ulnaris tendon with ulnar
subluxation (series 5, image 21). Remaining flexor and extensor
tendons are intact.

Carpal tunnel/median nerve: Normal carpal tunnel. Normal median
nerve.

Guyon's canal: Normal.

Joint/cartilage: No joint effusion. Moderate scaphotrapeziotrapezoid
and mild first CMC joint osteoarthritis.

Bones/carpal alignment: No acute fracture or dislocation. No
suspicious bone lesion.

Other: None.
IMPRESSION: 1. Split tear of the extensor carpi ulnaris tendon with ulnar
subluxation.
2. TFCC degeneration with torn articular disc.
3. Moderate scaphotrapeziotrapezoid and mild first CMC joint
osteoarthritis.

## 2021-04-28 DIAGNOSIS — M5030 Other cervical disc degeneration, unspecified cervical region: Secondary | ICD-10-CM | POA: Diagnosis not present

## 2021-04-28 DIAGNOSIS — Z79899 Other long term (current) drug therapy: Secondary | ICD-10-CM | POA: Diagnosis not present

## 2021-04-28 DIAGNOSIS — M5136 Other intervertebral disc degeneration, lumbar region: Secondary | ICD-10-CM | POA: Diagnosis not present

## 2021-04-28 DIAGNOSIS — G894 Chronic pain syndrome: Secondary | ICD-10-CM | POA: Diagnosis not present

## 2021-04-29 ENCOUNTER — Telehealth: Payer: Self-pay | Admitting: Radiology

## 2021-04-29 ENCOUNTER — Telehealth: Payer: Self-pay | Admitting: Orthopedic Surgery

## 2021-04-29 NOTE — Telephone Encounter (Signed)
Received refill request from pharmacy for Tizanidine mg tablet- 1 po q 8 hrs prn spasms #90-[id number]  Ok to refill?

## 2021-04-29 NOTE — Telephone Encounter (Signed)
Per chart, Dr. Prince Rome refilled on 04/22/2021. Refill not sent in.

## 2021-04-29 NOTE — Progress Notes (Signed)
Does she have f/u with us?

## 2021-04-30 ENCOUNTER — Ambulatory Visit: Payer: Medicare Other | Admitting: Orthopaedic Surgery

## 2021-05-02 ENCOUNTER — Ambulatory Visit (INDEPENDENT_AMBULATORY_CARE_PROVIDER_SITE_OTHER): Payer: Medicare Other | Admitting: Orthopedic Surgery

## 2021-05-02 ENCOUNTER — Other Ambulatory Visit: Payer: Self-pay

## 2021-05-02 DIAGNOSIS — M25511 Pain in right shoulder: Secondary | ICD-10-CM

## 2021-05-02 DIAGNOSIS — M25531 Pain in right wrist: Secondary | ICD-10-CM | POA: Diagnosis not present

## 2021-05-03 ENCOUNTER — Encounter: Payer: Self-pay | Admitting: Orthopedic Surgery

## 2021-05-04 ENCOUNTER — Encounter: Payer: Self-pay | Admitting: Orthopedic Surgery

## 2021-05-04 NOTE — Progress Notes (Signed)
Office Visit Note   Patient: Stephanie Carrillo           Date of Birth: 1955-09-12           MRN: [id number] Visit Date: 05/02/2021 Requested by: Eunice Blase, MD No address on file PCP: Eunice Blase, MD  Subjective: Chief Complaint  Patient presents with  . Right Shoulder - Pain    HPI: Stephanie Carrillo is a 65 y.o. female who presents to the office for MRI review. Patient denies any changes in symptoms.  Continues to complain mainly of right shoulder pain that she localizes to the anterior lateral aspect of the shoulder with radiation to the elbow.  She has difficulty lifting her arm.  Date of injury was 03/12/2021 when she fell while carrying a microwave.  She is right-hand dominant.  She had subsequent multiple hospitalizations due to multiple UTIs with sepsis and had to be hospitalized in the ICU over the last several weeks.  She is a retired [employer] scientist.  No previous surgery to the shoulder.  She enjoys gardening, planting, shopping in her free time.  She denies any use of blood thinners, history of diabetes, history of smoking.  She does have history of cardiac disease but she does not have a current cardiologist.  She is anticipating that she will be referred to cardiology by Dr. Junius Roads when she follows up with him on 05/08/2021.  She is also planning on following up with [facility] urology regarding follow-up for the UTIs.  She is currently asymptomatic from her UTIs.  She does not drive a car due to loss of sensation in her legs from multiple spine surgeries.  MRI results revealed: MR Wrist Right w/o contrast  Result Date: 04/29/2021 CLINICAL DATA:  Right wrist pain and weakness since fall 2 months ago. EXAM: MR OF THE RIGHT WRIST WITHOUT CONTRAST TECHNIQUE: Multiplanar, multisequence MR imaging of the right wrist was performed. No intravenous contrast was administered. COMPARISON:  Right forearm x-rays dated March 12, 2021. FINDINGS: Ligaments: Intact scapholunate and lunotriquetral ligaments.  Triangular fibrocartilage: TFCC degeneration with torn articular disc. Tendons: Split tear of the extensor carpi ulnaris tendon with ulnar subluxation (series 5, image 21). Remaining flexor and extensor tendons are intact. Carpal tunnel/median nerve: Normal carpal tunnel. Normal median nerve. Guyon's canal: Normal. Joint/cartilage: No joint effusion. Moderate scaphotrapeziotrapezoid and mild first CMC joint osteoarthritis. Bones/carpal alignment: No acute fracture or dislocation. No suspicious bone lesion. Other: None. IMPRESSION: 1. Split tear of the extensor carpi ulnaris tendon with ulnar subluxation. 2. TFCC degeneration with torn articular disc. 3. Moderate scaphotrapeziotrapezoid and mild first CMC joint osteoarthritis. Electronically Signed   By: Titus Dubin M.D.   On: 04/29/2021 10:38   MR SHOULDER RIGHT WO CONTRAST  Result Date: 04/29/2021 CLINICAL DATA:  Right shoulder pain and weakness since fall 2 months ago. EXAM: MRI OF THE RIGHT SHOULDER WITHOUT CONTRAST TECHNIQUE: Multiplanar, multisequence MR imaging of the shoulder was performed. No intravenous contrast was administered. COMPARISON:  Right shoulder x-rays dated October 05, 2020. FINDINGS: Rotator cuff: Full-thickness, full width tears of the supraspinatus and infraspinatus tendons with retraction to the glenoid. Intact subscapularis tendon with moderate tendinosis. The teres minor tendon is unremarkable. Muscles: Moderate supraspinatus and infraspinatus muscle edema without significant atrophy. Small partial tear of the lateral deltoid muscle with associated edema. Biceps long head:  Intact and normally positioned. Acromioclavicular Joint: Mild arthropathy of the acromioclavicular joint. Type II acromion. Small amount of fluid in the subacromial/subdeltoid bursa. Glenohumeral Joint: Scattered partial  and full-thickness cartilage loss over the humeral head. No significant joint effusion. Labrum:  Diffusely degenerated. Bones: High-riding  humeral head. Subacute to chronic impaction fracture of the greater tuberosity. No dislocation. No suspicious bone lesion. Other: None. IMPRESSION: 1. Full-thickness, full width tears of the supraspinatus and infraspinatus tendons with retraction to the glenoid. Moderate muscle edema without significant atrophy. 2. Small partial tear of the lateral deltoid muscle. 3. Subacute to chronic impaction fracture of the greater tuberosity. 4. Moderate subscapularis tendinosis. 5. Mild acromioclavicular and glenohumeral osteoarthritis. Electronically Signed   By: Titus Dubin M.D.   On: 04/29/2021 10:31                 ROS: All systems reviewed are negative as they relate to the chief complaint within the history of present illness.  Patient denies fevers or chills.  Assessment & Plan: Visit Diagnoses:  1. Acute pain of right shoulder   2. Pain in right wrist     Plan: Stephanie Carrillo is a 65 y.o. female who presents to the office for review of MRI of the right shoulder.  MRI demonstrated full-thickness full width tears of supraspinatus and infraspinatus tendons with retraction to the glenoid with high riding humeral head.  Imaging was reviewed with her today and she complains of both pain and lack of function.  Best option surgically for her would be reverse shoulder arthroplasty to relieve pain and restore her active motion.  Discussed the risks and benefits of this procedure as well as recovery timeframe including the risk of nerve/vessel damage, shoulder stiffness, shoulder instability, need for revision surgery, periprosthetic joint infection, operative complication such as fracture, medical complication.  After discussion, patient would like to proceed with surgery.  Plan to order thin cut CT scan of the right shoulder for preoperative planning purposes.  Patient met Dr. Marlou Sa today.  Call patient with CT scan results and posterior for surgery and follow-up after procedure.  Also referred patient to Dr.  Tempie Donning for discussion of right wrist MRI scan.  Follow-Up Instructions: No follow-ups on file.   Orders:  Orders Placed This Encounter  Procedures  . CT SHOULDER RIGHT WO CONTRAST  . Ambulatory referral to Orthopedic Surgery   No orders of the defined types were placed in this encounter.     Procedures: No procedures performed   Clinical Data: No additional findings.  Objective: Vital Signs: There were no vitals taken for this visit.  Physical Exam:  Constitutional: Patient appears well-developed HEENT:  Head: Normocephalic Eyes:EOM are normal Neck: Normal range of motion Cardiovascular: Normal rate Pulmonary/chest: Effort normal Neurologic: Patient is alert Skin: Skin is warm Psychiatric: Patient has normal mood and affect  Ortho Exam: Ortho exam demonstrates right shoulder with 30 degrees external rotation, 85 degrees abduction, 130 degrees forward flexion.  Axillary nerve intact with deltoid firing.  Subscapularis strength rated 5/5.  Supraspinatus strength rated 4/5 with 4/5 external rotation strength.  Positive external rotation lag sign.  Negative Hornblower sign.  Negative belly press test.  +5 motor strength of bilateral grip strength, finger abduction, pronation/supination, circumferential, deltoid.  Specialty Comments:  No specialty comments available.  Imaging: No results found.   PMFS History: Patient Active Problem List   Diagnosis Date Noted  . E coli bacteremia   . Sepsis ([place]) 03/28/2021  . Medication withdrawal ([place]) 03/18/2021  . Hypertensive urgency 03/18/2021  . Accelerated hypertension 03/17/2021  . Acute encephalopathy 03/17/2021  . AKI (acute kidney injury) ([place])   . Fall   .  Complicated UTI (urinary tract infection) 10/04/2020  . Nausea vomiting and diarrhea   . Acute metabolic encephalopathy 81/85/6314  . Benzodiazepine withdrawal ([place]) 02/01/2020  . Hypokalemia   . Tachycardia   . Altered mental state 01/27/2020  . Anxiety and  depression 09/23/2018  . Insomnia 09/23/2018  . Chronic pain syndrome 09/23/2018   Past Medical History:  Diagnosis Date  . Anxiety   . Depression   . Hypertension   . Insomnia   . Meningitis   . Recurrent UTI   . Seizures ([place])   . Substance abuse ([place])     Family History  Problem Relation Age of Onset  . Multiple sclerosis Mother   . Cancer Mother        Possibly breast or colon mets to brain  . Cancer Father   . Lung cancer Father   . Heart disease Father   . Cancer Brother   . Testicular cancer Brother   . Prostate cancer Brother   . Skin cancer Brother   . Colon polyps Brother   . Colon polyps Brother     Past Surgical History:  Procedure Laterality Date  . BACK SURGERY    . CERVICAL DISC SURGERY    . FRACTURE SURGERY N/A    [employer] 05/07/2020  . SPINE SURGERY N/A    [employer] 05/07/2020   Social History   Occupational History  . Not on file  Tobacco Use  . Smoking status: Former  . Smokeless tobacco: Never  Vaping Use  . Vaping Use: Never used  Substance and Sexual Activity  . Alcohol use: Not Currently  . Drug use: Not Currently    Comment: opiates -clean 6 months  . Sexual activity: Not on file

## 2021-05-06 DIAGNOSIS — M25511 Pain in right shoulder: Secondary | ICD-10-CM | POA: Diagnosis not present

## 2021-05-06 DIAGNOSIS — G8929 Other chronic pain: Secondary | ICD-10-CM | POA: Diagnosis not present

## 2021-05-06 DIAGNOSIS — M75121 Complete rotator cuff tear or rupture of right shoulder, not specified as traumatic: Secondary | ICD-10-CM | POA: Diagnosis not present

## 2021-05-06 DIAGNOSIS — S62101S Fracture of unspecified carpal bone, right wrist, sequela: Secondary | ICD-10-CM | POA: Diagnosis not present

## 2021-05-07 ENCOUNTER — Telehealth: Payer: Self-pay | Admitting: Family Medicine

## 2021-05-07 NOTE — Telephone Encounter (Signed)
Records emailed to [facility] 

## 2021-05-09 ENCOUNTER — Other Ambulatory Visit: Payer: Self-pay | Admitting: Family Medicine

## 2021-05-09 DIAGNOSIS — Z13820 Encounter for screening for osteoporosis: Secondary | ICD-10-CM

## 2021-05-16 ENCOUNTER — Encounter: Payer: Self-pay | Admitting: Orthopedic Surgery

## 2021-05-16 ENCOUNTER — Ambulatory Visit: Payer: Self-pay

## 2021-05-16 ENCOUNTER — Other Ambulatory Visit: Payer: Self-pay

## 2021-05-16 ENCOUNTER — Ambulatory Visit (INDEPENDENT_AMBULATORY_CARE_PROVIDER_SITE_OTHER): Payer: Medicare Other | Admitting: Orthopedic Surgery

## 2021-05-16 VITALS — BP 120/77 | HR 70 | Ht 66.0 in | Wt 181.8 lb

## 2021-05-16 DIAGNOSIS — M25531 Pain in right wrist: Secondary | ICD-10-CM | POA: Diagnosis not present

## 2021-05-17 ENCOUNTER — Ambulatory Visit: Payer: Medicare Other | Admitting: Orthopedic Surgery

## 2021-05-18 NOTE — Progress Notes (Signed)
Office Visit Note   Patient: Stephanie Carrillo           Date of Birth: [birth year]           MRN: [id number] Visit Date: 05/16/2021              Requested by: Julieanne Cotton, PA-C [address] [place],  [place] [address] PCP: Lavada Mesi, MD   Assessment & Plan: Visit Diagnoses:  1. Pain in right wrist     Plan: Discussed with patient that her MRI is notable for ulnar sided degenerative changes, however, she has no pain over the fovea or ECU tendon.  Her pain is diffuse and poorly localized.  She notes that her shoulder pain is by far her biggest issue.  She will get a removable wrist brace to wear as needed.  She will come back and see me after her shoulder arthroplasty is done to readdress her wrist issues.   Follow-Up Instructions: No follow-ups on file.   Orders:  Orders Placed This Encounter  Procedures   XR Wrist Complete Right   No orders of the defined types were placed in this encounter.     Procedures: No procedures performed   Clinical Data: No additional findings.   Subjective: Chief Complaint  Patient presents with   Right Wrist - Follow-up    This is a 65 yo RHD F who presents with R wrist pain since falling approximately 2 months ago while carrying a microwave. She describes subsequent severe pain in the right shoulder that radiates to her wrist.  She was seen by Dr. August Saucer who recommended a reverse shoulder arthroplasty which will happen sometime in the near future.  Her wrist pain is poorly localized and radiates distally from her shoulder.  She describes aching pain across both the dorsal and volar aspects of her wrist.  She notes that ROM of her wrist results in pain radiating distally from her shoulder. She has no specific ulnar sided wrist pain. Her shoulder pain is her biggest issue.  Of note, she is also followed by a pain management clinic for multiple neck and spine issues and chronic pain syndrome.    Review of Systems   Objective: Vital  Signs: BP 120/77 (BP Location: Left Arm, Patient Position: Sitting, Cuff Size: Normal)   Pulse 70   Ht 5\' 6"  (1.676 m)   Wt 181 lb 12.8 oz (82.5 kg)   SpO2 94%   BMI 29.34 kg/m   Physical Exam Constitutional:      Appearance: Normal appearance.  Cardiovascular:     Rate and Rhythm: Normal rate.     Pulses: Normal pulses.  Pulmonary:     Effort: Pulmonary effort is normal.  Skin:    General: Skin is warm and dry.     Capillary Refill: Capillary refill takes less than 2 seconds.  Neurological:     Mental Status: She is alert.    Right Hand Exam   Tenderness  Right hand tenderness location: TTP diffusely around wrist both dorsal and volar.  No obvious swelling.  Range of Motion  Wrist  Right wrist pronation: Full.  Right wrist supination: Full.   Muscle Strength  Wrist extension: 4/5  Wrist flexion: 4/5  Grip: 4/5   Other  Erythema: absent Sensation: normal Pulse: present  Comments:  TTP at dorsal DRUJ and along FCR tendon.  No foveal tenderness.  No tenderness over her ECU tendon.  No ECU instability with dart throwers motion.  Full  pronation/supination but with resulting shoulder pain.      Specialty Comments:  No specialty comments available.  Imaging: Previous MRI reviewed by me.  It demonstrates ulnar sided changes that are common degenerative issues.  She has a degenerative tear of her TFCC w/ a split tear in her ECU tendon.   3 views of her right wrist taken today are reviewed and interpreted by me.  They demonstrate mild degenerative changes at the DRUJ w/ osteophytes from the ulnar head.  She also has mild degenerative changes involving the thumb CMC joint w/ small trapezial osteophytes but well maintained joint space.    PMFS History: Patient Active Problem List   Diagnosis Date Noted   E coli bacteremia    Sepsis ([facility]) 03/28/2021   Medication withdrawal ([facility]) 03/18/2021   Hypertensive urgency 03/18/2021   Accelerated hypertension 03/17/2021    Acute encephalopathy 03/17/2021   AKI (acute kidney injury) ([facility])    Fall    Complicated UTI (urinary tract infection) 10/04/2020   Nausea vomiting and diarrhea    Acute metabolic encephalopathy 02/23/2020   Benzodiazepine withdrawal ([facility]) 02/01/2020   Hypokalemia    Tachycardia    Altered mental state 01/27/2020   Anxiety and depression 09/23/2018   Insomnia 09/23/2018   Chronic pain syndrome 09/23/2018   Past Medical History:  Diagnosis Date   Anxiety    Depression    Hypertension    Insomnia    Meningitis    Recurrent UTI    Seizures ([facility])    Substance abuse ([facility])     Family History  Problem Relation Age of Onset   Multiple sclerosis Mother    Cancer Mother        Possibly breast or colon mets to brain   Cancer Father    Lung cancer Father    Heart disease Father    Cancer Brother    Testicular cancer Brother    Prostate cancer Brother    Skin cancer Brother    Colon polyps Brother    Colon polyps Brother     Past Surgical History:  Procedure Laterality Date   BACK SURGERY     CERVICAL DISC SURGERY     FRACTURE SURGERY N/A    [employer] 05/07/2020   SPINE SURGERY N/A    [employer] 05/07/2020   Social History   Occupational History   Not on file  Tobacco Use   Smoking status: Former   Smokeless tobacco: Never  Building services engineer Use: Never used  Substance and Sexual Activity   Alcohol use: Not Currently   Drug use: Not Currently    Comment: opiates -clean 6 months   Sexual activity: Not on file

## 2021-05-21 ENCOUNTER — Ambulatory Visit
Admission: RE | Admit: 2021-05-21 | Discharge: 2021-05-21 | Disposition: A | Discharge status: Home / Self Care | Payer: Medicare Other | Source: Ambulatory Visit | Attending: Surgical | Admitting: Surgical

## 2021-05-21 DIAGNOSIS — M25511 Pain in right shoulder: Secondary | ICD-10-CM

## 2021-05-21 IMAGING — CT CT SHOULDER*R* W/O CM
1 of 2 series · 9 of 14 positions shown, 12 images · non-contrast
Comparison: MRI right shoulder dated [DATE].

CLINICAL DATA: Right shoulder pain.

EXAM:
CT OF THE UPPER RIGHT EXTREMITY WITHOUT CONTRAST
TECHNIQUE: Multidetector CT imaging of the upper right extremity was performed
according to the standard protocol.

[Series 14: shoulder 0.60 br40 s3 thins soft · axial · 0.53mm/px · z∈[-961,-817]mm · 9 of 302 slices shown, 12 images]
[im 31/302  soft-tissue]
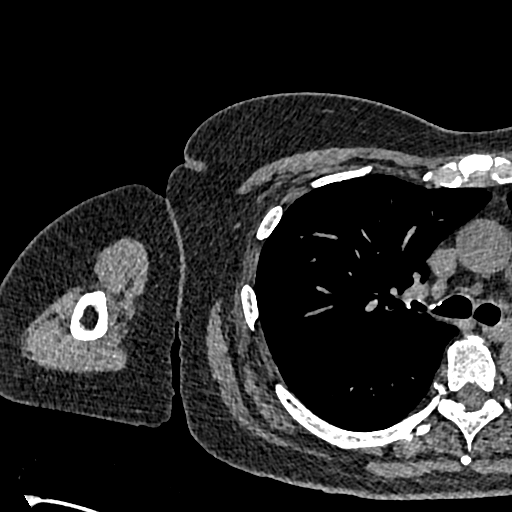
[im 31/302  bone]
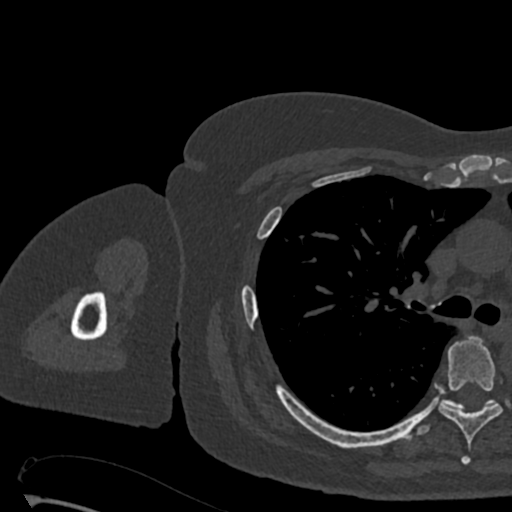
[im 61/302  bone]
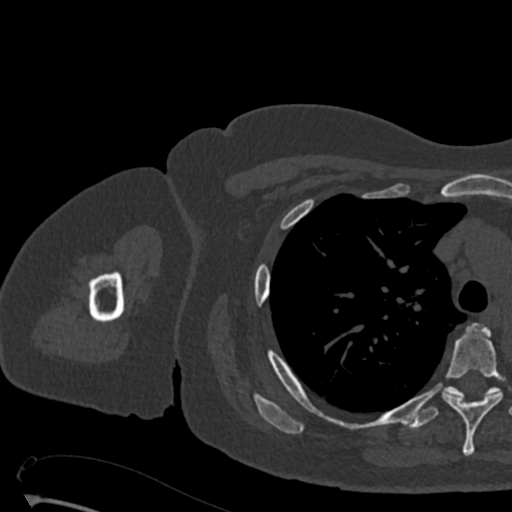
[im 91/302  bone]
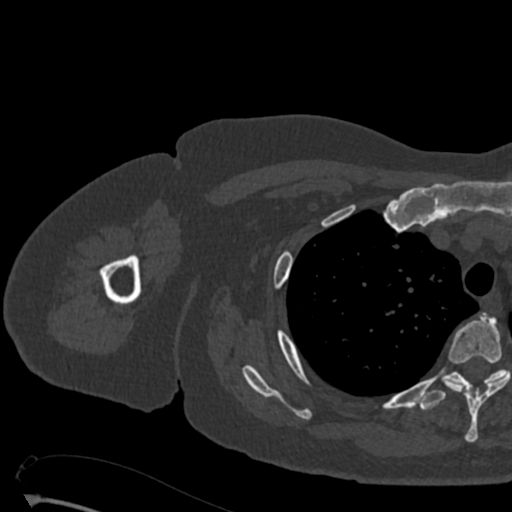
[im 121/302  bone]
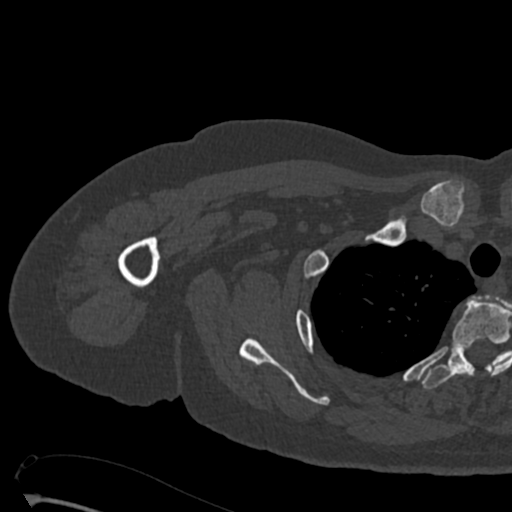
[im 151/302  soft-tissue]
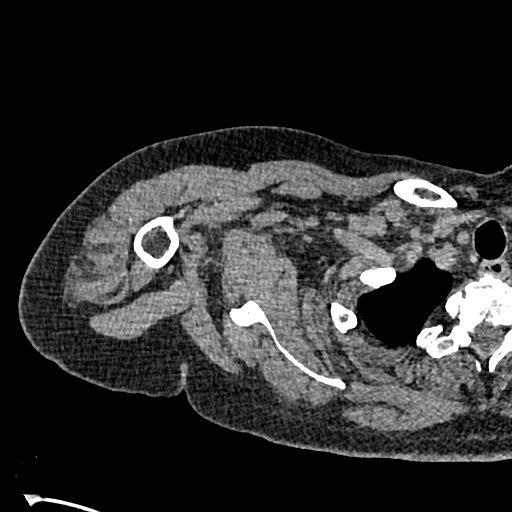
[im 151/302  bone]
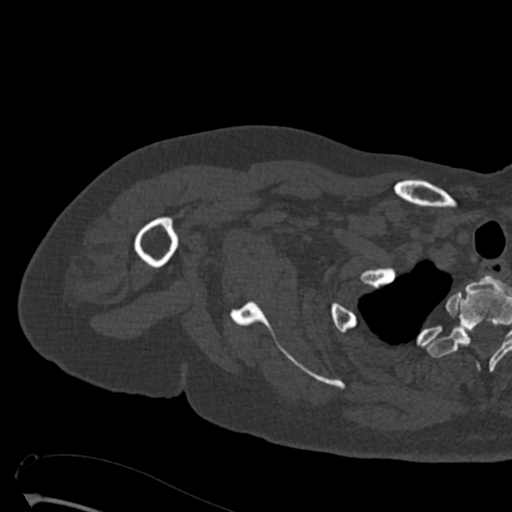
[im 181/302  bone]
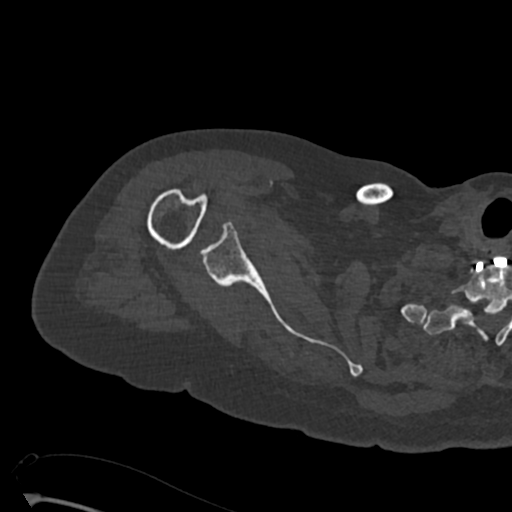
[im 211/302  bone]
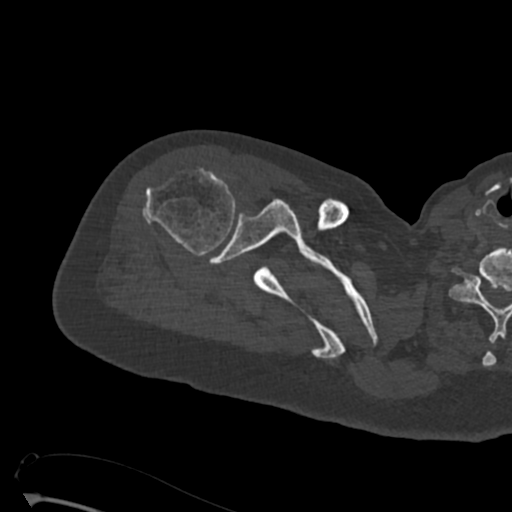
[im 241/302  bone]
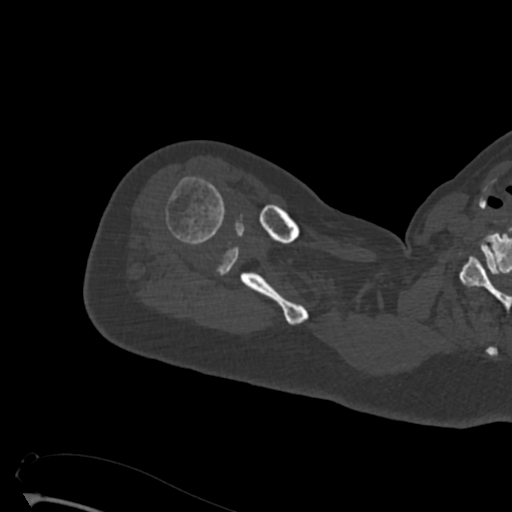
[im 271/302  soft-tissue]
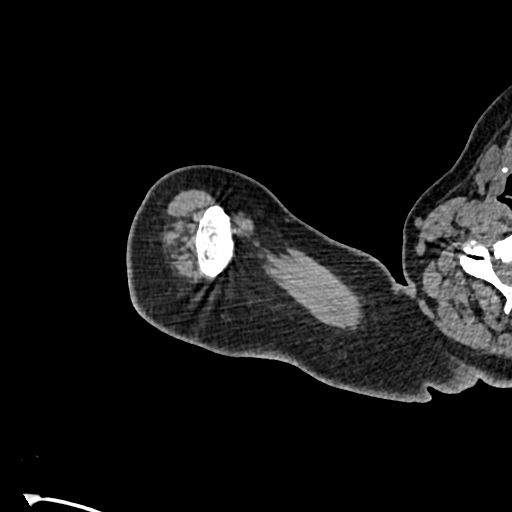
[im 271/302  bone]
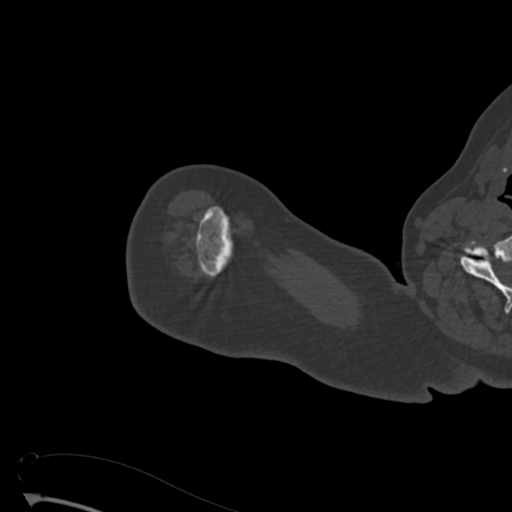

[9 of 14 positions shown; findings below may reference images not displayed]

FINDINGS: Bones/Joint/Cartilage

Subacute avulsion fractures of the greater tuberosity at the
supraspinatus and infraspinatus tendon attachments with retraction
of the bony fragments to the glenoid (series 4, image 20). No
dislocation. High-riding head.

Mild glenohumeral joint space narrowing with small marginal glenoid
osteophytes. Small joint effusion.

Mild arthropathy of the acromioclavicular joint.  Type II acromion.

Ligaments

Ligaments are suboptimally evaluated by CT.

Muscles and Tendons
Mild infraspinatus muscle fatty infiltration without frank atrophy.

Soft tissue
No fluid collection or hematoma.  No soft tissue mass.
IMPRESSION: 1. Subacute avulsion fractures of the greater tuberosity at the
supraspinatus and infraspinatus tendon attachments with retraction
of the bony fragments to the glenoid.
2. Mild glenohumeral and acromioclavicular osteoarthritis.

## 2021-05-23 ENCOUNTER — Encounter: Payer: Self-pay | Admitting: Orthopedic Surgery

## 2021-06-03 ENCOUNTER — Ambulatory Visit: Payer: Medicare Other | Admitting: Cardiovascular Disease

## 2021-06-03 ENCOUNTER — Encounter: Payer: Self-pay | Admitting: Orthopedic Surgery

## 2021-06-03 NOTE — Progress Notes (Deleted)
No chief complaint on file.      History of Present Illness: 65 yo female with history of anxiety, depression and HTN who is here today as a new consult, referred by Dr. Prince Rome, for cardiac risk assessment prior to planned shoulder replacement surgery. She has no prior cardiac issues.   Primary Care Physician: Lavada Mesi, MD   Past Medical History:  Diagnosis Date   Anxiety    Depression    Hypertension    Insomnia    Meningitis    Recurrent UTI    Seizures (HCC)    Substance abuse [facility])     Past Surgical History:  Procedure Laterality Date   BACK SURGERY     CERVICAL DISC SURGERY     FRACTURE SURGERY N/A    Phreesia 05/07/2020   SPINE SURGERY N/A    Phreesia 05/07/2020    Current Outpatient Medications  Medication Sig Dispense Refill   acetaminophen (TYLENOL) 500 MG tablet Take 2 tablets (1,000 mg total) by mouth every 6 (six) hours as needed. 30 tablet 0   amLODipine (NORVASC) 5 MG tablet Take 1 tablet (5 mg total) by mouth daily. 30 tablet 2   buprenorphine (SUBUTEX) 2 MG SUBL SL tablet Place 1 tablet (2 mg total) under the tongue every 6 (six) hours. 10 tablet 0   clonazePAM (KLONOPIN) 0.5 MG tablet Take 1 tablet (0.5 mg total) by mouth 3 (three) times daily as needed for anxiety. 90 tablet 1   DULoxetine (CYMBALTA) 60 MG capsule Take 1 capsule (60 mg total) by mouth daily. 90 capsule 1   gabapentin (NEURONTIN) 800 MG tablet Take 800 mg by mouth 3 (three) times daily.     lisinopril (ZESTRIL) 20 MG tablet Take 1 tablet (20 mg total) by mouth daily.     metoprolol tartrate (LOPRESSOR) 100 MG tablet Take 1 tablet (100 mg total) by mouth 2 (two) times daily.     pantoprazole (PROTONIX) 40 MG tablet Take 1 tablet (40 mg total) by mouth daily at 6 (six) AM. 30 tablet 1   polyethylene glycol (MIRALAX / GLYCOLAX) 17 g packet Take 17 g by mouth daily as needed for mild constipation. 14 each 0   No current facility-administered medications for this visit.    Allergies   Allergen Reactions   Ciprofloxacin Shortness Of Breath and Other (See Comments)    Respiratory arrest   Other Anaphylaxis   Penicillins Shortness Of Breath and Rash    Did it involve swelling of the face/tongue/throat, SOB, or low BP? y Did it involve sudden or severe rash/hives, skin peeling, or any reaction on the inside of your mouth or nose? y Did you need to seek medical attention at a hospital or doctor's office? n When did it last happen?  1985 If all above answers are "NO", may proceed with cephalosporin use.  Tolerated Ceftriaxone 10/05/20 - 10/07/20   Oxycodone Other (See Comments)    Patient in recovery process.    Social History   Socioeconomic History   Marital status: Divorced    Spouse name: Not on file   Number of children: Not on file   Years of education: Not on file   Highest education level: Not on file  Occupational History   Not on file  Tobacco Use   Smoking status: Former   Smokeless tobacco: Never  Vaping Use   Vaping Use: Never used  Substance and Sexual Activity   Alcohol use: Not Currently   Drug use: Not  Currently    Comment: opiates -clean 6 months   Sexual activity: Not on file  Other Topics Concern   Not on file  Social History Narrative   Not on file   Social Determinants of Health   Financial Resource Strain: Not on file  Food Insecurity: Not on file  Transportation Needs: Not on file  Physical Activity: Not on file  Stress: Not on file  Social Connections: Not on file  Intimate Partner Violence: Not on file    Family History  Problem Relation Age of Onset   Multiple sclerosis Mother    Cancer Mother        Possibly breast or colon mets to brain   Cancer Father    Lung cancer Father    Heart disease Father    Cancer Brother    Testicular cancer Brother    Prostate cancer Brother    Skin cancer Brother    Colon polyps Brother    Colon polyps Brother     Review of Systems:  As stated in the HPI and otherwise negative.    There were no vitals taken for this visit.  Physical Examination: General: Well developed, well nourished, NAD  HEENT: OP clear, mucus membranes moist  SKIN: warm, dry. No rashes. Neuro: No focal deficits  Musculoskeletal: Muscle strength 5/5 all ext  Psychiatric: Mood and affect normal  Neck: No JVD, no carotid bruits, no thyromegaly, no lymphadenopathy.  Lungs:Clear bilaterally, no wheezes, rhonci, crackles Cardiovascular: Regular rate and rhythm. No murmurs, gallops or rubs. Abdomen:Soft. Bowel sounds present. Non-tender.  Extremities: No lower extremity edema. Pulses are 2 + in the bilateral DP/PT.  EKG:  EKG {ACTION; IS/IS OEU:23536144} ordered today. The ekg ordered today demonstrates ***  Recent Labs: 03/18/2021: TSH 1.498 04/02/2021: Magnesium 1.8 04/03/2021: ALT 24; BUN 11; Hemoglobin 13.0; Platelets 306; Potassium 3.8; Sodium 140 04/04/2021: Creatinine, Ser 0.52   Lipid Panel    Component Value Date/Time   CHOL 241 (H) 10/26/2020 1408   TRIG 520 (H) 10/26/2020 1408   HDL 43 (L) 10/26/2020 1408   CHOLHDL 5.6 (H) 10/26/2020 1408   LDLCALC  10/26/2020 1408     Comment:     . LDL cholesterol not calculated. Triglyceride levels greater than 400 mg/dL invalidate calculated LDL results. . Reference range: <100 . Desirable range <100 mg/dL for primary prevention;   <70 mg/dL for patients with CHD or diabetic patients  with > or = 2 CHD risk factors. Marland Kitchen LDL-C is now calculated using the Martin-Hopkins  calculation, which is a validated novel method providing  better accuracy than the Friedewald equation in the  estimation of LDL-C.  Horald Pollen et al. Lenox Ahr. 3154;008(67): 2061-2068  (http://education.[facility].com/faq/FAQ164)      Wt Readings from Last 3 Encounters:  05/16/21 181 lb 12.8 oz (82.5 kg)  03/28/21 146 lb 2.6 oz (66.3 kg)  03/21/21 191 lb 5.8 oz (86.8 kg)     Other studies Reviewed: Additional studies/ records that were reviewed today include:  ***. Review of the above records demonstrates: ***   Assessment and Plan:   1.   Current medicines are reviewed at length with the patient today.  The patient {ACTIONS; HAS/DOES NOT HAVE:19233} concerns regarding medicines.  The following changes have been made:  {PLAN; NO CHANGE:13088:s}  Labs/ tests ordered today include: *** No orders of the defined types were placed in this encounter.    Disposition:   F/U with *** in {gen number 6-19:509326} {TIME; UNITS DAY/WEEK/MONTH:19136}  Signed, Verne Carrow, MD 06/03/2021 6:24 AM    [facility] [address] [place], [place], [place]  [address] Phone: [phone]; Fax: [phone]

## 2021-06-04 ENCOUNTER — Other Ambulatory Visit: Payer: Medicare Other

## 2021-06-04 DIAGNOSIS — G8929 Other chronic pain: Secondary | ICD-10-CM | POA: Diagnosis not present

## 2021-06-04 DIAGNOSIS — M75121 Complete rotator cuff tear or rupture of right shoulder, not specified as traumatic: Secondary | ICD-10-CM | POA: Diagnosis not present

## 2021-06-04 DIAGNOSIS — Z79899 Other long term (current) drug therapy: Secondary | ICD-10-CM | POA: Diagnosis not present

## 2021-06-04 DIAGNOSIS — M25511 Pain in right shoulder: Secondary | ICD-10-CM | POA: Diagnosis not present

## 2021-06-04 DIAGNOSIS — S62101S Fracture of unspecified carpal bone, right wrist, sequela: Secondary | ICD-10-CM | POA: Diagnosis not present

## 2021-06-13 ENCOUNTER — Encounter: Payer: Self-pay | Admitting: Cardiovascular Disease

## 2021-06-13 ENCOUNTER — Other Ambulatory Visit: Payer: Self-pay

## 2021-06-13 ENCOUNTER — Ambulatory Visit (INDEPENDENT_AMBULATORY_CARE_PROVIDER_SITE_OTHER): Payer: Medicare Other | Admitting: Cardiovascular Disease

## 2021-06-13 VITALS — BP 86/60 | HR 60 | Ht 66.0 in | Wt 180.0 lb

## 2021-06-13 DIAGNOSIS — Z01810 Encounter for preprocedural cardiovascular examination: Secondary | ICD-10-CM

## 2021-06-13 DIAGNOSIS — I517 Cardiomegaly: Secondary | ICD-10-CM | POA: Diagnosis not present

## 2021-06-13 NOTE — Progress Notes (Signed)
Chief Complaint  Patient presents with   New Patient (Initial Visit)    Pre-op cardiac exam     History of Present Illness: 65 yo Carrillo with history of chronic pain syndrome, anxiety, depression, HTN and seizures who is here today as a new consult, referred by Dr. Prince Rome, for preoperative cardiac risk assessment. She has had several recent admissions to [facility]. She was admitted in March 2022 with a UTI. She was admitted again in August 2022 with hypertensive urgency and UTI. She has an upcoming right shoulder surgery. She smoked 2 ppd for 15 years and quit 15 years ago. She is feeling great other than shoulder pain. She is having no chest pain or dyspnea. No LE edema. No dizziness or near syncope.   Primary Care Physician:Hilts, Casimiro Needle, MD   Past Medical History:  Diagnosis Date   Anxiety    Depression    Hypertension    Insomnia    Meningitis    Recurrent UTI    Seizures (HCC)    Substance abuse [facility])     Past Surgical History:  Procedure Laterality Date   BACK SURGERY     CERVICAL DISC SURGERY     FRACTURE SURGERY N/A    Phreesia 05/07/2020   SPINE SURGERY N/A    Phreesia 05/07/2020    Current Outpatient Medications  Medication Sig Dispense Refill   acetaminophen (TYLENOL) 500 MG tablet Take 2 tablets (1,000 mg total) by mouth every 6 (six) hours as needed. 30 tablet 0   amLODipine (NORVASC) 5 MG tablet Take 1 tablet (5 mg total) by mouth daily. 30 tablet 2   clonazePAM (KLONOPIN) 0.5 MG tablet Take 1 tablet (0.5 mg total) by mouth 3 (three) times daily as needed for anxiety. 90 tablet 1   DULoxetine (CYMBALTA) 60 MG capsule Take 1 capsule (60 mg total) by mouth daily. 90 capsule 1   gabapentin (NEURONTIN) 800 MG tablet Take 800 mg by mouth 3 (three) times daily.     HYDROcodone-acetaminophen (NORCO) 7.5-325 MG tablet Take 1 tablet by mouth every 6 (six) hours.     lisinopril (ZESTRIL) 20 MG tablet Take 1 tablet (20 mg total) by mouth daily.     metoprolol tartrate  (LOPRESSOR) 100 MG tablet Take 1 tablet (100 mg total) by mouth 2 (two) times daily.     pantoprazole (PROTONIX) 40 MG tablet Take 1 tablet (40 mg total) by mouth daily at 6 (six) AM. 30 tablet 1   tiZANidine (ZANAFLEX) 4 MG tablet Take 1 tablet by mouth 3 (three) times daily as needed.     No current facility-administered medications for this visit.    Allergies  Allergen Reactions   Ciprofloxacin Shortness Of Breath and Other (See Comments)    Respiratory arrest   Other Anaphylaxis   Penicillins Shortness Of Breath and Rash    Did it involve swelling of the face/tongue/throat, SOB, or low BP? y Did it involve sudden or severe rash/hives, skin peeling, or any reaction on the inside of your mouth or nose? y Did you need to seek medical attention at a hospital or doctor's office? n When did it last happen?  1985 If all above answers are "NO", may proceed with cephalosporin use.  Tolerated Ceftriaxone 10/05/20 - 10/07/20   Oxycodone Other (See Comments)    Patient in recovery process.    Social History   Socioeconomic History   Marital status: Divorced    Spouse name: Not on file   Number of children:  0   Years of education: Not on file   Highest education level: Not on file  Occupational History   Not on file  Tobacco Use   Smoking status: Former    Packs/day: 2.00    Years: 15.00    Pack years: 30.00    Types: Cigarettes   Smokeless tobacco: Never  Vaping Use   Vaping Use: Never used  Substance and Sexual Activity   Alcohol use: Not Currently   Drug use: Not Currently    Comment: opiates -clean 6 months   Sexual activity: Not on file  Other Topics Concern   Not on file  Social History Narrative   Not on file   Social Determinants of Health   Financial Resource Strain: Not on file  Food Insecurity: Not on file  Transportation Needs: Not on file  Physical Activity: Not on file  Stress: Not on file  Social Connections: Not on file  Intimate Partner Violence: Not  on file    Family History  Problem Relation Age of Onset   Multiple sclerosis Mother    Cancer Mother        Possibly breast or colon mets to brain   Cancer Father    Lung cancer Father    Heart disease Father    Cancer Brother    Testicular cancer Brother    Prostate cancer Brother    Skin cancer Brother    Colon polyps Brother    Colon polyps Brother     Review of Systems:  As stated in the HPI and otherwise negative.   BP (!) 86/60   Pulse 60   Ht 5\' 6"  (1.676 m)   Wt 180 lb (81.6 kg)   SpO2 98%   BMI 29.05 kg/m   Physical Examination: General: Well developed, well nourished, NAD  HEENT: OP clear, mucus membranes moist  SKIN: warm, dry. No rashes. Neuro: No focal deficits  Musculoskeletal: Muscle strength 5/5 all ext  Psychiatric: Mood and affect normal  Neck: No JVD, no carotid bruits, no thyromegaly, no lymphadenopathy.  Lungs:Clear bilaterally, no wheezes, rhonci, crackles Cardiovascular: Regular rate and rhythm. No murmurs, gallops or rubs. Abdomen:Soft. Bowel sounds present. Non-tender.  Extremities: No lower extremity edema. Pulses are 2 + in the bilateral DP/PT.  EKG:  EKG is not ordered today. The ekg from August 2022 is reviewed today   Recent Labs: 03/18/2021: TSH 1.498 04/02/2021: Magnesium 1.8 04/03/2021: ALT 24; BUN 11; Hemoglobin Stephanie.0; Platelets 306; Potassium 3.8; Sodium 140 04/04/2021: Creatinine, Ser 0.52   Lipid Panel    Component Value Date/Time   CHOL 241 (H) 10/26/2020 1408   TRIG 520 (H) 10/26/2020 1408   HDL 43 (L) 10/26/2020 1408   CHOLHDL 5.6 (H) 10/26/2020 1408   LDLCALC  10/26/2020 1408     Comment:     . LDL cholesterol not calculated. Triglyceride levels greater than 400 mg/dL invalidate calculated LDL results. . Reference range: <100 . Desirable range <100 mg/dL for primary prevention;   <70 mg/dL for patients with CHD or diabetic patients  with > or = 2 CHD risk factors. 10/28/2020 LDL-C is now calculated using the  Martin-Hopkins  calculation, which is a validated novel method providing  better accuracy than the Friedewald equation in the  estimation of LDL-C.  Marland Kitchen et al. Horald Pollen. Lenox Ahr): 2061-2068  (http://education.[facility].com/faq/FAQ164)      Wt Readings from Last 3 Encounters:  06/13/21 180 lb (81.6 kg)  10/Stephanie/22 181 lb 12.8 oz (82.5 kg)  03/28/21 146 lb 2.6 oz (66.3 kg)   Assessment and Plan:   1. Cardiomyegaly 2. Pre-operative cardiovascular examination  Chest x-ray in August 2022 with possible cardiomegaly. She is doing well overall. I would like to arrange an echocardiogram prior to her planned shoulder surgery to evaluate LV size and LVEF. No worrisome findings on cardiac exam. Once her echo is completed, she will be given clearance to proceed with her shoulder replacement.   Current medicines are reviewed at length with the patient today.  The patient does not have concerns regarding medicines.  The following changes have been made:  no change  Labs/ tests ordered today include:   Orders Placed This Encounter  Procedures   ECHOCARDIOGRAM COMPLETE    Disposition:   F/U with me as needed.   Signed, Verne Carrow, MD 06/13/2021 9:25 AM    [facility] [address] [place], [place], [place]  [address] Phone: [phone]; Fax: [phone]

## 2021-06-13 NOTE — Patient Instructions (Signed)
Medication Instructions:  Your physician recommends that you continue on your current medications as directed. Please refer to the Current Medication list given to you today.  *If you need a refill on your cardiac medications before your next appointment, please call your pharmacy*   Lab Work: None  If you have labs (blood work) drawn today and your tests are completely normal, you will receive your results only by: MyChart Message (if you have MyChart) OR A paper copy in the mail If you have any lab test that is abnormal or we need to change your treatment, we will call you to review the results.   Testing/Procedures: Your physician has requested that you have an echocardiogram on 06/26/21 at 3:05 PM. Please arrive at 2:50 PM. Echocardiography is a painless test that uses sound waves to create images of your heart. It provides your doctor with information about the size and shape of your heart and how well your heart's chambers and valves are working. This procedure takes approximately one hour. There are no restrictions for this procedure.    Follow-Up: As needed based on test results

## 2021-06-17 ENCOUNTER — Other Ambulatory Visit: Payer: Self-pay

## 2021-06-17 DIAGNOSIS — N3946 Mixed incontinence: Secondary | ICD-10-CM | POA: Diagnosis not present

## 2021-06-17 DIAGNOSIS — N390 Urinary tract infection, site not specified: Secondary | ICD-10-CM | POA: Diagnosis not present

## 2021-06-17 DIAGNOSIS — Z8619 Personal history of other infectious and parasitic diseases: Secondary | ICD-10-CM | POA: Diagnosis not present

## 2021-06-17 DIAGNOSIS — M6289 Other specified disorders of muscle: Secondary | ICD-10-CM | POA: Diagnosis not present

## 2021-06-17 DIAGNOSIS — N952 Postmenopausal atrophic vaginitis: Secondary | ICD-10-CM | POA: Insufficient documentation

## 2021-06-26 ENCOUNTER — Ambulatory Visit (HOSPITAL_COMMUNITY): Payer: Medicare Other | Attending: Cardiovascular Disease

## 2021-06-26 ENCOUNTER — Encounter: Payer: Medicare Other | Admitting: Orthopedic Surgery

## 2021-06-26 ENCOUNTER — Other Ambulatory Visit: Payer: Self-pay

## 2021-06-26 DIAGNOSIS — I517 Cardiomegaly: Secondary | ICD-10-CM | POA: Insufficient documentation

## 2021-06-26 DIAGNOSIS — Z01810 Encounter for preprocedural cardiovascular examination: Secondary | ICD-10-CM | POA: Diagnosis not present

## 2021-06-26 LAB — ECHOCARDIOGRAM COMPLETE
Area-P 1/2: 3.88 cm2
S' Lateral: 2.8 cm

## 2021-07-02 DIAGNOSIS — S62101S Fracture of unspecified carpal bone, right wrist, sequela: Secondary | ICD-10-CM | POA: Diagnosis not present

## 2021-07-02 DIAGNOSIS — M75121 Complete rotator cuff tear or rupture of right shoulder, not specified as traumatic: Secondary | ICD-10-CM | POA: Diagnosis not present

## 2021-07-02 DIAGNOSIS — Z79899 Other long term (current) drug therapy: Secondary | ICD-10-CM | POA: Diagnosis not present

## 2021-07-02 DIAGNOSIS — M25511 Pain in right shoulder: Secondary | ICD-10-CM | POA: Diagnosis not present

## 2021-07-02 DIAGNOSIS — G8929 Other chronic pain: Secondary | ICD-10-CM | POA: Diagnosis not present

## 2021-07-04 ENCOUNTER — Telehealth: Payer: Self-pay | Admitting: Orthopedic Surgery

## 2021-07-04 NOTE — Telephone Encounter (Signed)
Patient's right reverse shoulder arthroplasty is scheduled for 07-18-21.  She would like to know how long you plan on keeping her in the hospital and wants to know if you have sent in the referral for her to stay at [facility].  When patient was scheduled I discussed with her an overnight stay in the hospital and a case manager/social worker would take care of placement to SNF.  Patient states that is not what was discussed with Dr. August Saucer and again would like to know if a referral was made to [facility].  She does not want to rely on her brother for recovery. Call back to [phone]

## 2021-07-04 NOTE — Telephone Encounter (Signed)
Some patients can figure out beforehand but we typically admit the patient for 2 nights and then she goes to skilled nursing.  I have never made a referral to a specific nursing home.  If she wants to do that that is fine with me.  Usually the social worker will work on it to but that typically not until she is in the hospital.  I did have 1 patient who worked it all out before hand.  He did that on his own though without my involvement.

## 2021-07-05 NOTE — Telephone Encounter (Signed)
I tried calling-no answer. LMVM advising of below. Also did let her know I was sending this to you to see if you knew of anything we could do prior to her surgery. Advised on VM either you or myself would be reaching back out to her.

## 2021-07-10 ENCOUNTER — Ambulatory Visit: Payer: Medicare Other | Admitting: Cardiovascular Disease

## 2021-07-12 NOTE — Progress Notes (Signed)
Surgical Instructions    Your procedure is scheduled on Thursday, December 15th, 2022.   Report to [facility] Main Entrance "A" at 10:00 A.M., then check in with the Admitting office.  Call this number if you have problems the morning of surgery:  [phone]   If you have any questions prior to your surgery date call [phone]: Open Monday-Friday 8am-4pm    Remember:  Do not eat after midnight the night before your surgery  You may drink clear liquids until 09:00 the morning of your surgery.   Clear liquids allowed are: Water, Non-Citrus Juices (without pulp), Carbonated Beverages, Clear Tea, Black Coffee ONLY (NO MILK, CREAM OR POWDERED CREAMER of any kind), and Gatorade  Patient Instructions  The night before surgery:  No food after midnight. ONLY clear liquids after midnight  The day of surgery (if you do NOT have diabetes):  Drink ONE (1) Pre-Surgery Clear Ensure by 09:00 the morning of surgery. Drink in one sitting. Do not sip.  This drink was given to you during your hospital  pre-op appointment visit.  Nothing else to drink after completing the  Pre-Surgery Clear Ensure.           If you have questions, please contact your surgeon's office.     Take these medicines the morning of surgery with A SIP OF WATER:  amLODipine (NORVASC)  clonazePAM (KLONOPIN) DULoxetine (CYMBALTA)  gabapentin (NEURONTIN)  HYDROcodone-acetaminophen (NORCO) metoprolol tartrate (LOPRESSOR) pantoprazole (PROTONIX) tiZANidine (ZANAFLEX)  trimethoprim (TRIMPEX) acetaminophen (TYLENOL) - if needed   As of today, STOP taking any Aspirin (unless otherwise instructed by your surgeon) Aleve, Naproxen, Ibuprofen, Motrin, Advil, Goody's, BC's, all herbal medications, fish oil, and all vitamins.   After your COVID test   You are not required to quarantine however you are required to wear a well-fitting mask when you are out and around people not in your household.  If your mask becomes  wet or soiled, replace with a new one.  Wash your hands often with soap and water for 20 seconds or clean your hands with an alcohol-based hand sanitizer that contains at least 60% alcohol.  Do not share personal items.  Notify your provider: if you are in close contact with someone who has COVID  or if you develop a fever of 100.4 or greater, sneezing, cough, sore throat, shortness of breath or body aches.    The day of surgery:          Do not wear jewelry or makeup Do not wear lotions, powders, perfumes, or deodorant. Do not shave 48 hours prior to surgery.   Do not bring valuables to the hospital. DO Not wear nail polish, gel polish, artificial nails, or any other type of covering on natural nails including finger and toenails. If patients have artificial nails, gel coating, etc. that need to be removed by a nail salon, please have this removed prior to surgery or surgery may need to be canceled/delayed if the surgeon/ anesthesia feels like the patient is unable to be adequately monitored.              [facility] is not responsible for any belongings or valuables.  Do NOT Smoke (Tobacco/Vaping)  24 hours prior to your procedure  If you use a CPAP at night, you may bring your mask for your overnight stay.   Contacts, glasses, hearing aids, dentures or partials may not be worn into surgery, please bring cases for these belongings   For patients admitted to  the hospital, discharge time will be determined by your treatment team.   Patients discharged the day of surgery will not be allowed to drive home, and someone needs to stay with them for 24 hours.  NO VISITORS WILL BE ALLOWED IN PRE-OP WHERE PATIENTS ARE PREPPED FOR SURGERY.  ONLY 1 SUPPORT PERSON MAY BE PRESENT IN THE WAITING ROOM WHILE YOU ARE IN SURGERY.  IF YOU ARE TO BE ADMITTED, ONCE YOU ARE IN YOUR ROOM YOU WILL BE ALLOWED TWO (2) VISITORS. 1 (ONE) VISITOR MAY STAY OVERNIGHT BUT MUST ARRIVE TO THE ROOM BY 8pm.  Minor  children may have two parents present. Special consideration for safety and communication needs will be reviewed on a case by case basis.  Special instructions:    Oral Hygiene is also important to reduce your risk of infection.  Remember - BRUSH YOUR TEETH THE MORNING OF SURGERY WITH YOUR REGULAR TOOTHPASTE    [place]- Preparing for Total Shoulder Arthroplasty  Before surgery, you can play an important role. Because skin is not sterile, your skin needs to be as free of germs as possible. You can reduce the number of germs on your skin by using the following products.   Benzoyl Peroxide Gel  o Reduces the number of germs present on the skin  o Applied twice a day to shoulder area starting two days before surgery   Chlorhexidine Gluconate (CHG) Soap (instructions listed above on how to wash with CHG Soap)  o An antiseptic cleaner that kills germs and bonds with the skin to continue killing germs even after washing  o Used for showering the night before surgery and morning of surgery   ==================================================================  Please follow these instructions carefully:  BENZOYL PEROXIDE 5% GEL  Please do not use if you have an allergy to benzoyl peroxide. If your skin becomes reddened/irritated stop using the benzoyl peroxide.  Starting two days before surgery, apply as follows:  1. Apply benzoyl peroxide in the morning and at night. Apply after taking a shower. If you are not taking a shower clean entire shoulder front, back, and side along with the armpit with a clean wet washcloth.  2. Place a quarter-sized dollop on your SHOULDER and rub in thoroughly, making sure to cover the front, back, and side of your shoulder, along with the armpit.   2 Days prior to Surgery First Dose on ____Tuesday_________ Morning Second Dose on ___Tuesday___________ Night  Day Before Surgery First Dose on ______Wednesady________ Morning Night before surgery  wash (entire body except face and private areas) with CHG Soap THEN Second Dose on ____Wednesady________ Night   Morning of Surgery  wash BODY AGAIN with CHG Soap   4. Do NOT apply benzoyl peroxide gel on the day of surgery   [place]- Preparing For Surgery  Before surgery, you can play an important role. Because skin is not sterile, your skin needs to be as free of germs as possible. You can reduce the number of germs on your skin by washing with CHG (chlorahexidine gluconate) Soap before surgery.  CHG is an antiseptic cleaner which kills germs and bonds with the skin to continue killing germs even after washing.     Please do not use if you have an allergy to CHG or antibacterial soaps. If your skin becomes reddened/irritated stop using the CHG.  Do not shave (including legs and underarms) for at least 48 hours prior to first CHG shower. It is OK to shave your face.  Please follow  these instructions carefully.     Shower the NIGHT BEFORE SURGERY and the MORNING OF SURGERY with CHG Soap.   If you chose to wash your hair, wash your hair first as usual with your normal shampoo. After you shampoo, rinse your hair and body thoroughly to remove the shampoo.  Then [employer] and genitals (private parts) with your normal soap and rinse thoroughly to remove soap.  After that Use CHG Soap as you would any other liquid soap. You can apply CHG directly to the skin and wash gently with a scrungie or a clean washcloth.   Apply the CHG Soap to your body ONLY FROM THE NECK DOWN.  Do not use on open wounds or open sores. Avoid contact with your eyes, ears, mouth and genitals (private parts). Wash Face and genitals (private parts)  with your normal soap.   Wash thoroughly, paying special attention to the area where your surgery will be performed.  Thoroughly rinse your body with warm water from the neck down.  DO NOT shower/wash with your normal soap after using and rinsing off the CHG Soap.  Pat  yourself dry with a CLEAN TOWEL.  8. Apply the Benzoyl Peroxide only the night before surgery.  Do Not use it the morning of surgery.  Wear CLEAN PAJAMAS to bed the night before surgery  Place CLEAN SHEETS on your bed the night before your surgery  DO NOT SLEEP WITH PETS.   Day of Surgery: Take a shower with CHG soap. Wear Clean/Comfortable clothing the morning of surgery Do not apply any deodorants/lotions.   Remember to brush your teeth WITH YOUR REGULAR TOOTHPASTE.   Please read over the following fact sheets that you were given.

## 2021-07-15 ENCOUNTER — Encounter (HOSPITAL_COMMUNITY): Payer: Self-pay

## 2021-07-15 ENCOUNTER — Other Ambulatory Visit: Payer: Self-pay

## 2021-07-15 ENCOUNTER — Encounter (HOSPITAL_COMMUNITY)
Admission: RE | Admit: 2021-07-15 | Discharge: 2021-07-15 | Disposition: A | Discharge status: Home / Self Care | Payer: Medicare Other | Source: Ambulatory Visit | Attending: Orthopedic Surgery | Admitting: Orthopedic Surgery

## 2021-07-15 ENCOUNTER — Encounter (HOSPITAL_COMMUNITY): Payer: Self-pay | Admitting: Vascular Surgery

## 2021-07-15 ENCOUNTER — Other Ambulatory Visit (HOSPITAL_COMMUNITY): Payer: Medicare Other

## 2021-07-15 ENCOUNTER — Telehealth: Payer: Self-pay | Admitting: Orthopedic Surgery

## 2021-07-15 VITALS — BP 120/76 | HR 71 | Temp 98.6°F | Resp 19 | Ht 66.0 in | Wt 170.1 lb

## 2021-07-15 DIAGNOSIS — N390 Urinary tract infection, site not specified: Secondary | ICD-10-CM

## 2021-07-15 DIAGNOSIS — Z20822 Contact with and (suspected) exposure to covid-19: Secondary | ICD-10-CM | POA: Insufficient documentation

## 2021-07-15 DIAGNOSIS — Z01818 Encounter for other preprocedural examination: Secondary | ICD-10-CM | POA: Diagnosis not present

## 2021-07-15 DIAGNOSIS — R748 Abnormal levels of other serum enzymes: Secondary | ICD-10-CM

## 2021-07-15 HISTORY — DX: Pneumonia, unspecified organism: J18.9

## 2021-07-15 HISTORY — DX: Cardiac murmur, unspecified: R01.1

## 2021-07-15 HISTORY — DX: Chronic kidney disease, unspecified: N18.9

## 2021-07-15 HISTORY — DX: Unspecified osteoarthritis, unspecified site: M19.90

## 2021-07-15 HISTORY — DX: Gastro-esophageal reflux disease without esophagitis: K21.9

## 2021-07-15 LAB — URINALYSIS, COMPLETE (UACMP) WITH MICROSCOPIC
Bilirubin Urine: NEGATIVE
Glucose, UA: NEGATIVE mg/dL
Hgb urine dipstick: NEGATIVE
Ketones, ur: NEGATIVE mg/dL
Nitrite: POSITIVE — AB
Protein, ur: NEGATIVE mg/dL
Specific Gravity, Urine: 1.012 (ref 1.005–1.030)
WBC, UA: >50 WBC/hpf — ABNORMAL HIGH (ref 0–5)
pH: 5 (ref 5.0–8.0)

## 2021-07-15 LAB — CBC
HCT: 46.3 % — ABNORMAL HIGH (ref 36.0–46.0)
Hemoglobin: 14.8 g/dL (ref 12.0–15.0)
MCH: 30.1 pg (ref 26.0–34.0)
MCHC: 32 g/dL (ref 30.0–36.0)
MCV: 94.1 fL (ref 80.0–100.0)
Platelets: 346 10*3/uL (ref 150–400)
RBC: 4.92 MIL/uL (ref 3.87–5.11)
RDW: 12.8 % (ref 11.5–15.5)
WBC: 7.8 10*3/uL (ref 4.0–10.5)
nRBC: 0 % (ref 0.0–0.2)

## 2021-07-15 LAB — BASIC METABOLIC PANEL WITH GFR
Anion gap: 10 (ref 5–15)
BUN: 36 mg/dL — ABNORMAL HIGH (ref 8–23)
CO2: 23 mmol/L (ref 22–32)
Calcium: 9.7 mg/dL (ref 8.9–10.3)
Chloride: 103 mmol/L (ref 98–111)
Creatinine, Ser: 1.85 mg/dL — ABNORMAL HIGH (ref 0.44–1.00)
GFR, Estimated: 30 mL/min — ABNORMAL LOW
Glucose, Bld: 101 mg/dL — ABNORMAL HIGH (ref 70–99)
Potassium: 5 mmol/L (ref 3.5–5.1)
Sodium: 136 mmol/L (ref 135–145)

## 2021-07-15 LAB — SARS CORONAVIRUS 2 (TAT 6-24 HRS): SARS Coronavirus 2: NEGATIVE

## 2021-07-15 LAB — SURGICAL PCR SCREEN
MRSA, PCR: NEGATIVE
Staphylococcus aureus: NEGATIVE

## 2021-07-15 NOTE — Telephone Encounter (Signed)
Patient is scheduled for right reverse shoulder 07-18-21  12pm [facility].  Please see lab results per Edgardo Roys [phone]) with pre-admission testing.   Creatinine- 1.85 Urine- leukocytes Nitrites- positive Bacteria- many WBC- 50+  Cultures- no result yet

## 2021-07-15 NOTE — Progress Notes (Addendum)
PCP - Lavada Mesi, MD Cardiologist - Verne Carrow, MR  PPM/ICD - denies Device Orders - n/a Rep Notified - n/a  Chest x-ray - 03/28/2021 EKG - 03/29/2021 Stress Test - denies ECHO - 06/26/2021 Cardiac Cath - denies  Sleep Study - denies CPAP - n/a  Fasting Blood Sugar - n/a  Blood Thinner Instructions: n/a  Aspirin Instructions: Patient was instructed: As of today, STOP taking any Aspirin (unless otherwise instructed by your surgeon) Aleve, Naproxen, Ibuprofen, Motrin, Advil, Goody's, BC's, all herbal medications, fish oil, and all vitamins.  ERAS Protcol - yes PRE-SURGERY Ensure - yes  COVID TEST- yes, done in PAT on 07/15/2021  Anesthesia review: yes - cardiac history> Patient was hospitalized in November at [facility] with Hypertensive Crisis. In PAT patient denied any distress. Abnormal labs in PAT - Dr. August Saucer - notified  Patient denies shortness of breath, fever, cough and chest pain at PAT appointment   All instructions explained to the patient, with a verbal understanding of the material. Patient agrees to go over the instructions while at home for a better understanding. Patient also instructed to self quarantine after being tested for COVID-19. The opportunity to ask questions was provided.

## 2021-07-15 NOTE — Progress Notes (Signed)
Abnormal labs in PAT. Dr. August Saucer office was called and the surgical scheduler was notified about these results (Creatinine 1.85; Urinalysis - Bacteria, UA - many; WBC, UA > 50; Nitrite - positive; Leukocytes - large).

## 2021-07-15 NOTE — Telephone Encounter (Signed)
I called and left message telling her that there clouds on the horizon as far as her surgery goes on Thursday.  Please refer directly to nephrology so that she could potentially be seen tomorrow or Wednesday.  I did tell her that it is looking cuff for [employer] for 4 her surgery Thursday particularly given the fact that she has these findings being on an antibiotic for her history of urosepsis.  Please also send these results to whoever her urologist is so that they can weigh in on does she need a different antibiotic or what is this really represents.  Thanks

## 2021-07-16 ENCOUNTER — Telehealth: Payer: Self-pay | Admitting: Orthopedic Surgery

## 2021-07-16 NOTE — Telephone Encounter (Signed)
Ok I did call her and lmom yesterday also

## 2021-07-16 NOTE — Telephone Encounter (Signed)
Pt called stating she spoke with Dr.Dean last night in regards to her surgery being canceled and he told her to call today and ask to speak to Lauren. Pt would like a CB please.   [phone]

## 2021-07-16 NOTE — Progress Notes (Signed)
Anesthesia Chart Review:  Case: [id number] Date/Time: 07/18/21 1149   Procedure: RIGHT REVERSE SHOULDER ARTHROPLASTY (Right: Shoulder)   Anesthesia type: General   Pre-op diagnosis: RIGHT SHOULDER OSTEOARTHRITIS   [facility] ROOM 07 / [facility]   Surgeons: Cammy Copa, MD       DISCUSSION: Patient is a 65 year old female scheduled for the above procedure.  History includes former smoker, HTN, murmur (trivial MR 06/2021), CKD, GERD, meningitis, recurrent UTI (admission 10/2020 & 03/2021 E. Coli UTI), seizures, opiate substance abuse (accidental OD 09/2018; benzo withdrawal 01/2020, 02/2020 & 03/2021), spinal surgery.  - [facility] admission 03/28/21-04/05/21 for sepsis due to you UTI and E. coli bacteremia that was resistant to Bactrim. Also with acute metabolic encephalopathy thought related to sepsis.  BP medications also adjusted for poorly controlled hypertension. (Coreg changed to metoprolol tartrate 100 mg twice daily, lisinopril 20 mg daily added, continue amlodipine 5 mg daily.)  - [facility] admission 03/17/21-03/21/21 for AMS/acute metabolic encephalopathy and hypertensive urgency in setting of running out of of Suboxone, gabapentin, tizanidine, Klonopin, and amlodipine.  Head CT and MRI of the brain did not show acute findings.  Critical care consulted for severe agitation felt likely related to medication withdrawal.  Symptoms improved with resumption of buprenorphine, gabapentin, duloxetine, and as needed clonazepam and tizanidine.  Amlodipine and carvedilol also resumed. Urine culture also grew E. Coli (resulted after discharge). Patient had been discharged on Bactrim with recommendation for PCP and urology follow-up, as she did not want to wait for final culture result.  She had recent cardiology evaluation by Dr. Clifton James on 05/14/2021 for preoperative cardiac evaluation with history of admission with hypertensive urgency in August 2022.  August CXR showed possible cardiomegaly, and  03/28/21 EKG with possible prolonged QTc. Echo ordered and showed normal LV function, EF 55 to 60%, trivial MR.  Dr. Clifton James felt she could "proceed with her planned surgical procedure."  07/15/21 labs show + urine culture for E. Coli. She was just started on trimethoprin 100 mg daily on 06/17/21 for UTI prevention. Her Creatinine has jumped quite a bit since 04/04/21. Now at 1.85, up from 0.52. She was started on lisinopril during that admission, so this may be contributing along with recurrent UTI. Dr. August Saucer has referred her urgently to nephrology and back to urology. Surgery is likely being rescheduled.    VS: BP 120/76    Pulse 71    Temp 37 C (Oral)    Resp 19    Ht 5\' 6"  (1.676 m)    Wt 77.2 kg    SpO2 96%    BMI 27.45 kg/m    PROVIDERS: Hilts, Michael, MD is PCP  , MD is cardiologist.  - Notes indicated she has seen urologist Verne Carrow, MD in the past, but most recent urology note seen is an initial [facility] visit on 06/17/21 with 06/19/21, FNP.    LABS: Preoperative labs noted. See DISCUSSION. A1c 5.1% 10/26/20. AST/ALT normal 04/03/21.  (all labs ordered are listed, but only abnormal results are displayed)  Labs Reviewed  URINE CULTURE - Abnormal; Notable for the following components:      Result Value   Culture >=100,000 COLONIES/mL ESCHERICHIA COLI (*)    All other components within normal limits  CBC - Abnormal; Notable for the following components:   HCT 46.3 (*)    All other components within normal limits  BASIC METABOLIC PANEL - Abnormal; Notable for the following components:  Glucose, Bld 101 (*)    BUN 36 (*)    Creatinine, Ser 1.85 (*)    GFR, Estimated 30 (*)    All other components within normal limits  URINALYSIS, COMPLETE (UACMP) WITH MICROSCOPIC - Abnormal; Notable for the following components:   APPearance CLOUDY (*)    Nitrite POSITIVE (*)    Leukocytes,Ua LARGE (*)    WBC, UA >50 (*)    Bacteria, UA MANY  (*)    Non Squamous Epithelial 0-5 (*)    All other components within normal limits  SURGICAL PCR SCREEN  SARS CORONAVIRUS 2 (TAT 6-24 HRS)    EEG 01/28/20: IMPRESSION: This study is suggestive of moderate diffuse encephalopathy, nonspecific etiology but likely related to toxic-metabolic etiology. No seizures or definite epileptiform discharges were seen throughout the recording.   IMAGES: CT Right shoulder 05/21/21: IMPRESSION: 1. Subacute avulsion fractures of the greater tuberosity at the supraspinatus and infraspinatus tendon attachments with retraction of the bony fragments to the glenoid. 2. Mild glenohumeral and acromioclavicular osteoarthritis.  CT Head 03/29/21: IMPRESSION: No evidence of acute intracranial abnormality.  CXR 03/28/21: FINDINGS: Cardiomegaly. Both lungs are clear. Disc degenerative disease of the thoracic spine. IMPRESSION: Cardiomegaly without acute abnormality of the lungs.   EKG: 03/28/21 13:21:59 Sinus tachycardia Low voltage QRS Nonspecific ST abnormality Critical Prolonged QT [QT 372 ms, QTcB 551 ms] Abnormal ECG Sinus tachycardia, disagree with QTc measure Confirmed by Coralee Pesa [phone]) on 03/28/2021 1:27:33 PM   CV: Echo 06/26/21: IMPRESSIONS   1. Left ventricular ejection fraction, by estimation, is 55 to 60%. Left  ventricular ejection fraction by 3D volume is 60 %. The left ventricle has  normal function. The left ventricle has no regional wall motion  abnormalities. Left ventricular diastolic   parameters were normal.   2. Right ventricular systolic function is normal. The right ventricular  size is normal.   3. The mitral valve is normal in structure. Trivial mitral valve  regurgitation.   4. The aortic valve is normal in structure. Aortic valve regurgitation is  not visualized. No aortic stenosis is present.    Past Medical History:  Diagnosis Date   Anxiety    Arthritis    Chronic kidney disease    Depression     GERD (gastroesophageal reflux disease)    Heart murmur    Hypertension    Insomnia    Meningitis    Pneumonia    Recurrent UTI    Seizures (HCC)    Substance abuse (HCC)     Past Surgical History:  Procedure Laterality Date   BACK SURGERY     CERVICAL DISC SURGERY     FRACTURE SURGERY N/A    [employer] 05/07/2020   SPINE SURGERY N/A    [employer] 05/07/2020    MEDICATIONS:  acetaminophen (TYLENOL) 500 MG tablet   amLODipine (NORVASC) 5 MG tablet   buprenorphine (SUBUTEX) 2 MG SUBL SL tablet   clonazePAM (KLONOPIN) 0.5 MG tablet   diclofenac Sodium (VOLTAREN) 1 % GEL   DULoxetine (CYMBALTA) 60 MG capsule   estradiol (ESTRACE) 0.1 MG/GM vaginal cream   gabapentin (NEURONTIN) 800 MG tablet   HYDROcodone-acetaminophen (NORCO) 10-325 MG tablet   ibuprofen (ADVIL) 200 MG tablet   lisinopril (ZESTRIL) 20 MG tablet   metoprolol tartrate (LOPRESSOR) 100 MG tablet   Multiple Vitamins-Minerals (CENTRUM SILVER 50+WOMEN) TABS   pantoprazole (PROTONIX) 40 MG tablet   rosuvastatin (CRESTOR) 10 MG tablet   tiZANidine (ZANAFLEX) 4 MG tablet   trimethoprim (TRIMPEX) 100  MG tablet   No current facility-administered medications for this encounter.    Shonna Chock, PA-C Surgical Short Stay/Anesthesiology [facility] Phone [phone] [facility] Phone [phone] 07/16/2021 5:38 PM

## 2021-07-16 NOTE — Telephone Encounter (Signed)
I faxed urology as urgent to [phone], and sent Nephrology thru epic to [facility]. Someone should be contacting pt to schedule appt.

## 2021-07-16 NOTE — Telephone Encounter (Signed)
I tried calling patient. No answer. No VM available to LM

## 2021-07-17 ENCOUNTER — Encounter: Payer: Self-pay | Admitting: Orthopedic Surgery

## 2021-07-17 LAB — URINE CULTURE: Culture: >=100000 — AB

## 2021-07-18 ENCOUNTER — Encounter (HOSPITAL_COMMUNITY): Admission: RE | Payer: Self-pay | Source: Home / Self Care

## 2021-07-18 ENCOUNTER — Ambulatory Visit (HOSPITAL_COMMUNITY): Admission: RE | Admit: 2021-07-18 | Payer: Medicare Other | Source: Home / Self Care | Admitting: Orthopedic Surgery

## 2021-07-18 DIAGNOSIS — Z01818 Encounter for other preprocedural examination: Secondary | ICD-10-CM

## 2021-07-18 SURGERY — ARTHROPLASTY, SHOULDER, TOTAL, REVERSE
Anesthesia: General | Site: Shoulder | Laterality: Right

## 2021-07-25 ENCOUNTER — Encounter: Payer: Medicare Other | Admitting: Orthopedic Surgery

## 2021-07-30 DIAGNOSIS — Z79899 Other long term (current) drug therapy: Secondary | ICD-10-CM | POA: Diagnosis not present

## 2021-07-30 DIAGNOSIS — G8929 Other chronic pain: Secondary | ICD-10-CM | POA: Diagnosis not present

## 2021-07-30 DIAGNOSIS — M25511 Pain in right shoulder: Secondary | ICD-10-CM | POA: Diagnosis not present

## 2021-07-30 DIAGNOSIS — M75121 Complete rotator cuff tear or rupture of right shoulder, not specified as traumatic: Secondary | ICD-10-CM | POA: Diagnosis not present

## 2021-07-30 DIAGNOSIS — S62101S Fracture of unspecified carpal bone, right wrist, sequela: Secondary | ICD-10-CM | POA: Diagnosis not present

## 2021-08-07 ENCOUNTER — Encounter: Payer: Self-pay | Admitting: *Deleted

## 2021-08-07 DIAGNOSIS — N179 Acute kidney failure, unspecified: Secondary | ICD-10-CM | POA: Diagnosis not present

## 2021-08-07 DIAGNOSIS — I129 Hypertensive chronic kidney disease with stage 1 through stage 4 chronic kidney disease, or unspecified chronic kidney disease: Secondary | ICD-10-CM | POA: Diagnosis not present

## 2021-08-07 DIAGNOSIS — F419 Anxiety disorder, unspecified: Secondary | ICD-10-CM | POA: Diagnosis not present

## 2021-08-07 DIAGNOSIS — N1832 Chronic kidney disease, stage 3b: Secondary | ICD-10-CM | POA: Diagnosis not present

## 2021-08-07 DIAGNOSIS — N390 Urinary tract infection, site not specified: Secondary | ICD-10-CM | POA: Diagnosis not present

## 2021-08-08 DIAGNOSIS — R296 Repeated falls: Secondary | ICD-10-CM | POA: Diagnosis not present

## 2021-08-08 DIAGNOSIS — R519 Headache, unspecified: Secondary | ICD-10-CM | POA: Diagnosis not present

## 2021-08-09 DIAGNOSIS — N952 Postmenopausal atrophic vaginitis: Secondary | ICD-10-CM | POA: Diagnosis not present

## 2021-08-09 DIAGNOSIS — N9489 Other specified conditions associated with female genital organs and menstrual cycle: Secondary | ICD-10-CM | POA: Diagnosis not present

## 2021-08-09 DIAGNOSIS — N3946 Mixed incontinence: Secondary | ICD-10-CM | POA: Diagnosis not present

## 2021-08-09 DIAGNOSIS — N390 Urinary tract infection, site not specified: Secondary | ICD-10-CM | POA: Diagnosis not present

## 2021-08-13 ENCOUNTER — Other Ambulatory Visit: Payer: Self-pay | Admitting: Nephrology

## 2021-08-13 DIAGNOSIS — N1832 Chronic kidney disease, stage 3b: Secondary | ICD-10-CM

## 2021-08-22 ENCOUNTER — Ambulatory Visit
Admission: RE | Admit: 2021-08-22 | Discharge: 2021-08-22 | Disposition: A | Discharge status: Home / Self Care | Payer: Medicare Other | Source: Ambulatory Visit | Attending: Nephrology | Admitting: Nephrology

## 2021-08-22 DIAGNOSIS — N189 Chronic kidney disease, unspecified: Secondary | ICD-10-CM | POA: Diagnosis not present

## 2021-08-22 DIAGNOSIS — N1832 Chronic kidney disease, stage 3b: Secondary | ICD-10-CM

## 2021-08-22 IMAGING — US US RENAL ARTERY STENOSIS
1 series · 14 of 25 positions shown · non-contrast
Comparison: None.

CLINICAL DATA: CKD

Hypertension
EXAM:
RENAL/URINARY TRACT ULTRASOUND
RENAL DUPLEX DOPPLER ULTRASOUND

[Series 1: us renal artery stenosis · 14 of 37 slices shown]
[im 1/37]
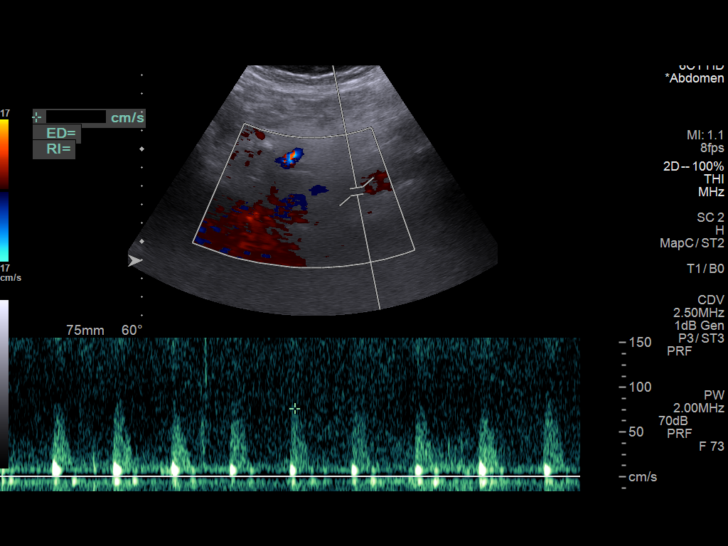
[im 4/37]
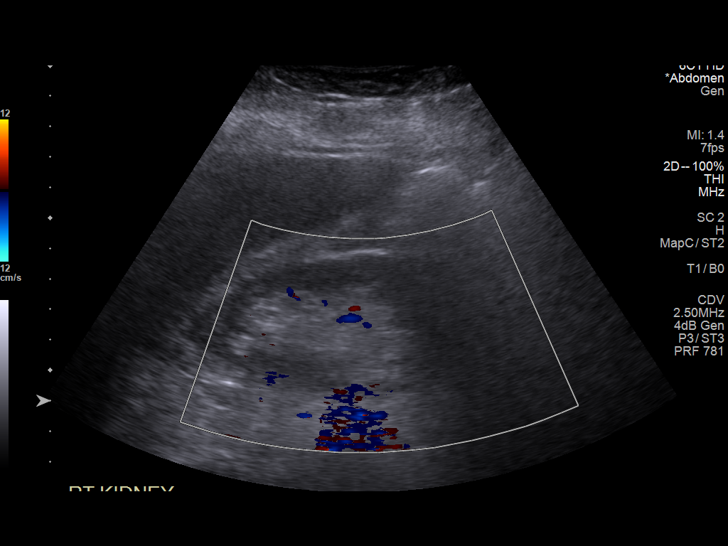
[im 7/37]
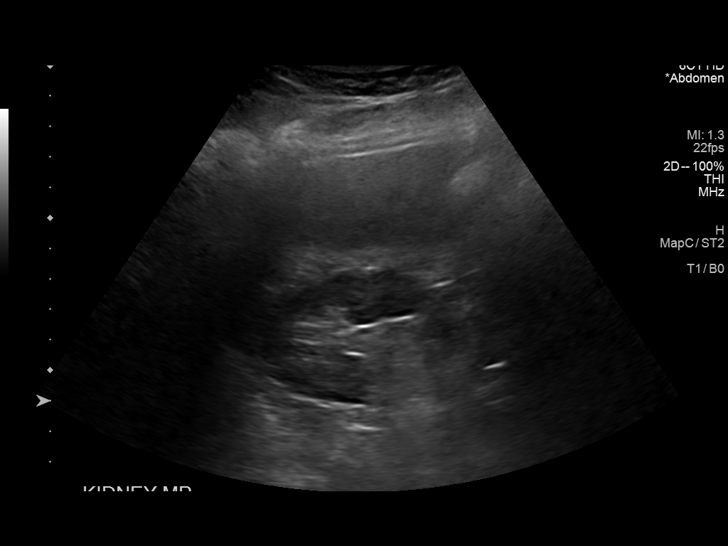
[im 10/37]
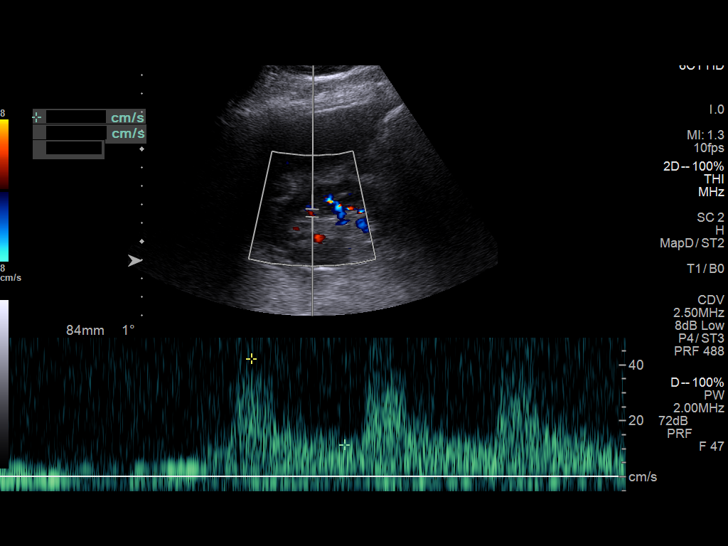
[im 13/37]
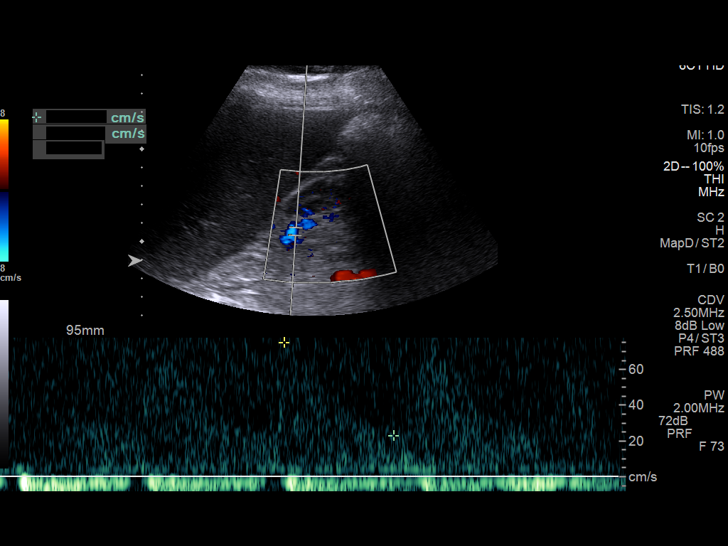
[im 14/37]
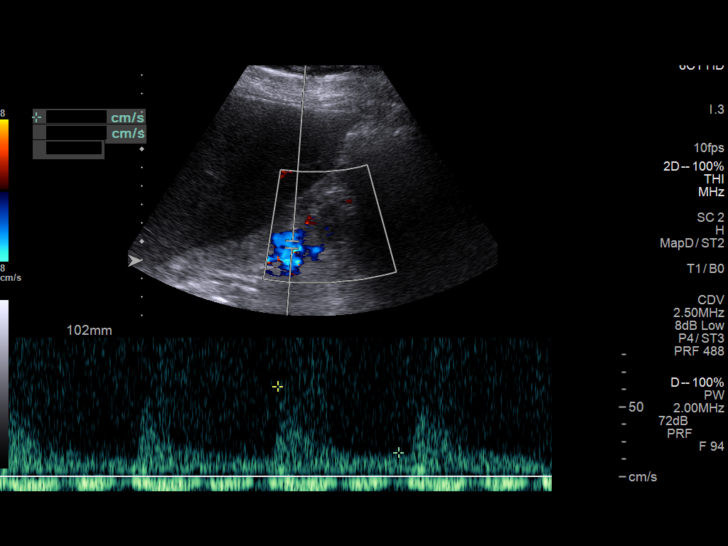
[im 17/37]
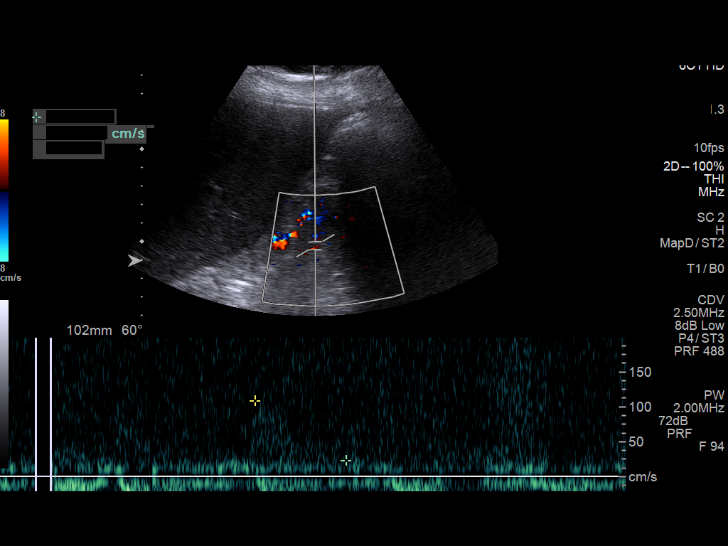
[im 20/37]
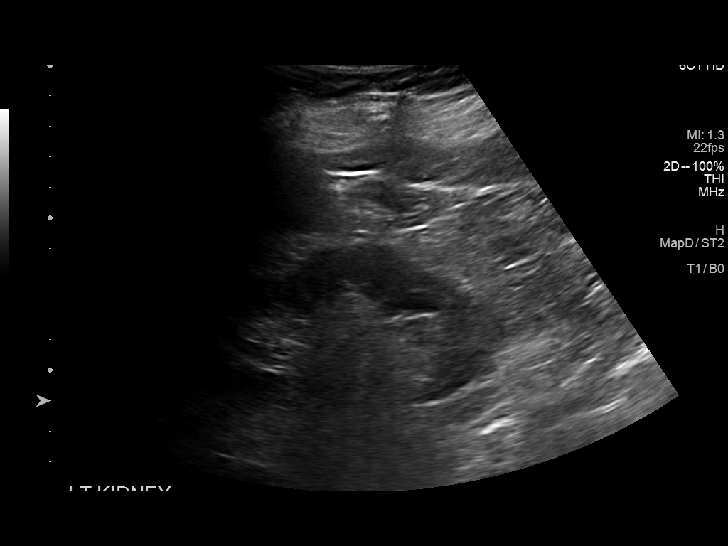
[im 23/37]
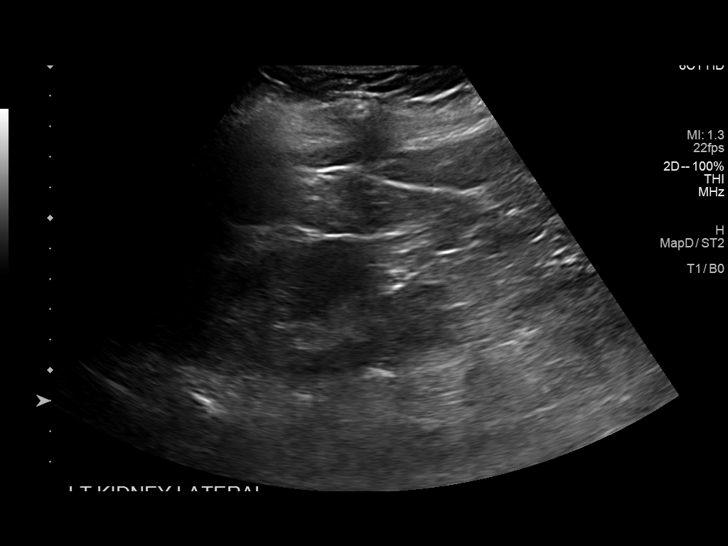
[im 25/37]
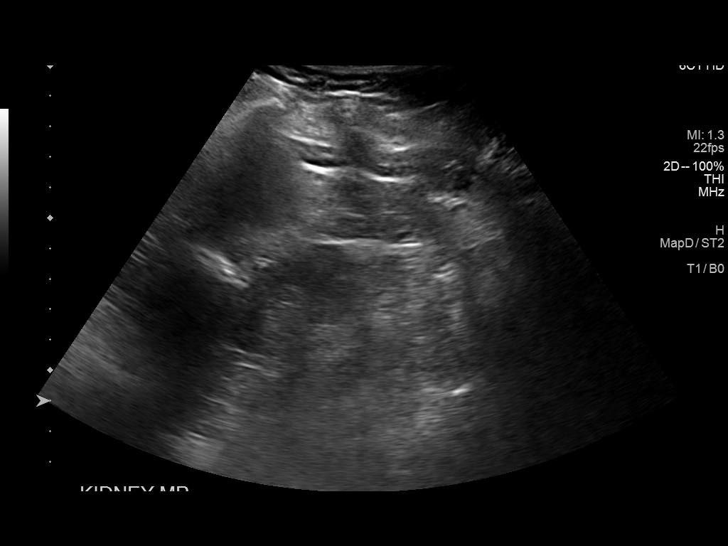
[im 28/37]
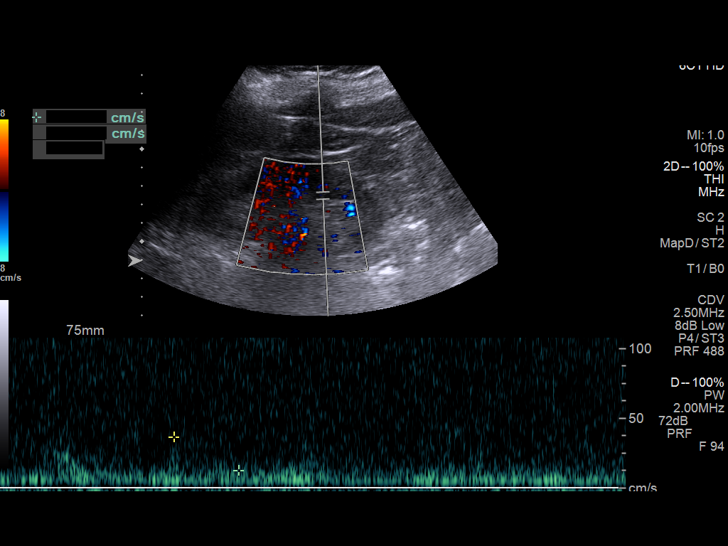
[im 31/37]
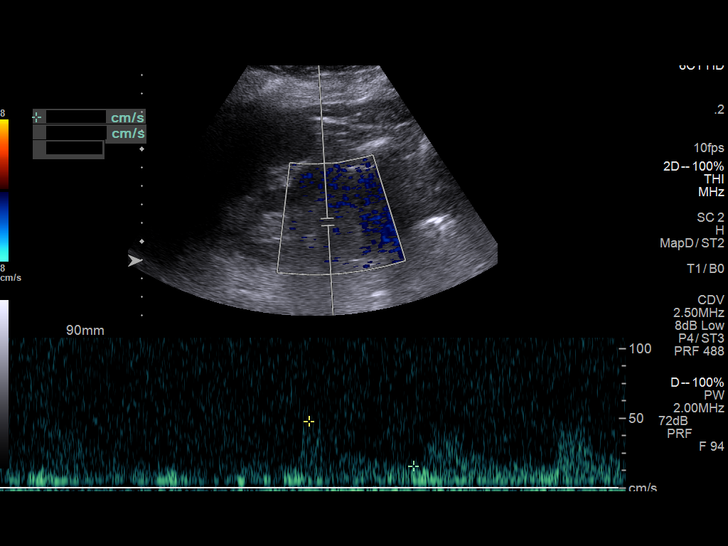
[im 34/37]
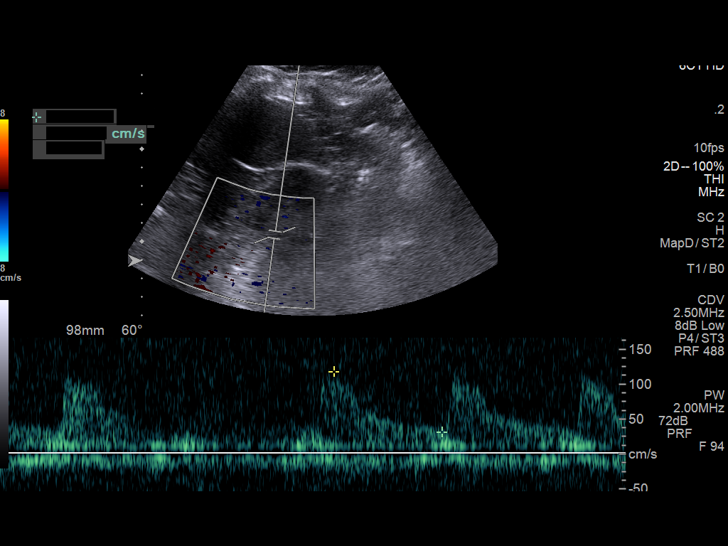
[im 37/37]
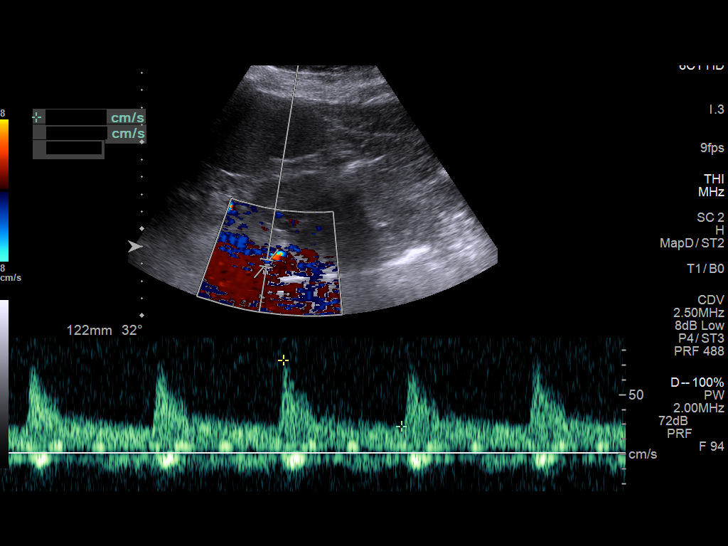

[14 of 25 positions shown; findings below may reference images not displayed]

FINDINGS: Right Kidney:

Length: 9.6 cm. Echogenicity within normal limits. No mass or
hydronephrosis visualized.

Left Kidney:

Length: 10.9 cm. Echogenicity within normal limits. No mass or
hydronephrosis visualized.

RENAL DUPLEX ULTRASOUND

Right Renal Artery Velocities:

Origin:  98 cm/sec

Mid:  124 cm/sec

Hilum:  108 cm/sec

Interlobar:  75 cm/sec

Arcuate:  64 cm/sec

Left Renal Artery Velocities:

Origin:  79 cm/sec

Mid:  112 cm/sec

Hilum:  118 cm/sec

Interlobar:  72 cm/sec

Arcuate:  42 cm/sec

Aortic Velocity:  76 cm/sec

Right Renal-Aortic Ratios:

Origin:

Mid:

Hilum:

Interlobar:

Arcuate:

Left Renal-Aortic Ratios:

Origin:

Mid:

Hilum:

Interlobar:

Arcuate:
IMPRESSION: 1. No significant sonographic abnormality of the kidneys.
2. No Doppler evidence of significant renal artery stenosis.

## 2021-08-27 DIAGNOSIS — M25511 Pain in right shoulder: Secondary | ICD-10-CM | POA: Diagnosis not present

## 2021-08-27 DIAGNOSIS — M75121 Complete rotator cuff tear or rupture of right shoulder, not specified as traumatic: Secondary | ICD-10-CM | POA: Diagnosis not present

## 2021-08-27 DIAGNOSIS — S62101S Fracture of unspecified carpal bone, right wrist, sequela: Secondary | ICD-10-CM | POA: Diagnosis not present

## 2021-08-27 DIAGNOSIS — G8929 Other chronic pain: Secondary | ICD-10-CM | POA: Diagnosis not present

## 2021-09-02 DIAGNOSIS — N179 Acute kidney failure, unspecified: Secondary | ICD-10-CM | POA: Diagnosis not present

## 2021-09-02 DIAGNOSIS — I129 Hypertensive chronic kidney disease with stage 1 through stage 4 chronic kidney disease, or unspecified chronic kidney disease: Secondary | ICD-10-CM | POA: Diagnosis not present

## 2021-09-02 DIAGNOSIS — M75100 Unspecified rotator cuff tear or rupture of unspecified shoulder, not specified as traumatic: Secondary | ICD-10-CM | POA: Diagnosis not present

## 2021-09-10 ENCOUNTER — Telehealth: Payer: Self-pay | Admitting: Orthopedic Surgery

## 2021-09-10 NOTE — Telephone Encounter (Signed)
Patient called. She is ready to have surgery on her R shoulder. Her call back number is [phone]

## 2021-09-11 NOTE — Telephone Encounter (Signed)
Done thx

## 2021-09-26 DIAGNOSIS — M75121 Complete rotator cuff tear or rupture of right shoulder, not specified as traumatic: Secondary | ICD-10-CM | POA: Diagnosis not present

## 2021-09-26 DIAGNOSIS — S62101S Fracture of unspecified carpal bone, right wrist, sequela: Secondary | ICD-10-CM | POA: Diagnosis not present

## 2021-09-26 DIAGNOSIS — Z79899 Other long term (current) drug therapy: Secondary | ICD-10-CM | POA: Diagnosis not present

## 2021-09-26 DIAGNOSIS — G8929 Other chronic pain: Secondary | ICD-10-CM | POA: Diagnosis not present

## 2021-09-26 DIAGNOSIS — M25511 Pain in right shoulder: Secondary | ICD-10-CM | POA: Diagnosis not present

## 2021-10-02 ENCOUNTER — Encounter: Payer: Medicare Other | Admitting: Orthopedic Surgery

## 2021-10-07 NOTE — Pre-Procedure Instructions (Addendum)
Surgical Instructions ? ? ? Your procedure is scheduled on Tuesday, March 14th. ? Report to [facility] Main Entrance "A" at 9:30 A.M., then check in with the Admitting office. ? Call this number if you have problems the morning of surgery: ? [phone] ? ? If you have any questions prior to your surgery date call [phone]: Open Monday-Friday 8am-4pm ? ? ? Remember: ? Do not eat after midnight the night before your surgery ? ?You may drink clear liquids until 8:30 a.m. the morning of your surgery.   ?Clear liquids allowed are: Water, Non-Citrus Juices (without pulp), Carbonated Beverages, Clear Tea, Black Coffee Only (NO MILK, CREAM OR POWDERED CREAMER of any kind), and Gatorade. ? ? ?Enhanced Recovery after Surgery for Orthopedics ?Enhanced Recovery after Surgery is a protocol used to improve the stress on your body and your recovery after surgery. ? ?Patient Instructions ? ?The day of surgery (if you do NOT have diabetes):  ?Drink ONE (1) Pre-Surgery Clear Ensure by 8:30 a.m. the morning of surgery   ?This drink was given to you during your hospital  ?pre-op appointment visit. ?Nothing else to drink after completing the  ?Pre-Surgery Clear Ensure. ? ?       If you have questions, please contact your surgeon?s office. ? ?  ? Take these medicines the morning of surgery with A SIP OF WATER  ?DULoxetine (CYMBALTA) ?gabapentin (NEURONTIN)  ?LINZESS  ?HYDROcodone-acetaminophen (NORCO)  ?metoprolol tartrate (LOPRESSOR) ?NORVASC ?pantoprazole (PROTONIX)  ?tiZANidine (ZANAFLEX)  ?trimethoprim (TRIMPEX)  ? ?As needed: ?acetaminophen (TYLENOL)  ?clonazePAM (KLONOPIN)  ? ?Anesthesia would like you to HOLD buprenorphine (SUBUTEX) for 3 days prior to surgery. Please discuss with your provider regarding to stopping.  ? ?As of today, STOP taking any Aspirin (unless otherwise instructed by your surgeon) Aleve, Naproxen, Ibuprofen, Motrin, Advil, Goody's, BC's, all herbal medications, fish oil, and all vitamins. This includes:  diclofenac Sodium (VOLTAREN) GEL. ?         ?           ?Do NOT Smoke (Tobacco/Vaping) for 24 hours prior to your procedure. ? ?If you use a CPAP at night, you may bring your mask/headgear for your overnight stay. ?  ?Contacts, glasses, piercing's, hearing aid's, dentures or partials may not be worn into surgery, please bring cases for these belongings.  ?  ?For patients admitted to the hospital, discharge time will be determined by your treatment team. ?  ?Patients discharged the day of surgery will not be allowed to drive home, and someone needs to stay with them for 24 hours. ? ?NO VISITORS WILL BE ALLOWED IN PRE-OP WHERE PATIENTS ARE PREPPED FOR SURGERY.  ONLY 1 SUPPORT PERSON MAY BE PRESENT IN THE WAITING ROOM WHILE YOU ARE IN SURGERY.  IF YOU ARE TO BE ADMITTED, ONCE YOU ARE IN YOUR ROOM YOU WILL BE ALLOWED TWO (2) VISITORS. (1) VISITOR MAY STAY OVERNIGHT BUT MUST ARRIVE TO THE ROOM BY 8pm.  Minor children may have two parents present. Special consideration for safety and communication needs will be reviewed on a case by case basis. ? ? ?Special instructions:   ?                                 [facility]- Preparing for Total Shoulder Arthroplasty  ? ?Before surgery, you can play an important role. Because skin is not sterile, your skin needs to be as free of germs as  possible. You can reduce the number of germs on your skin by using the following products. ?Benzoyl Peroxide Gel ?Reduces the number of germs present on the skin ?Applied twice a day to shoulder area starting two days before surgery   ?Chlorhexidine Gluconate (CHG) Soap ?An antiseptic cleaner that kills germs and bonds with the skin to continue killing germs even after washing ?Used for showering the night before surgery and morning of surgery ?  ?Oral Hygiene is also important to reduce your risk of infection.                                    ?Remember - BRUSH YOUR TEETH THE MORNING OF SURGERY WITH YOUR REGULAR  TOOTHPASTE ? ?================================================================== ? ?Please follow these instructions carefully: ? ?BENZOYL PEROXIDE 5% GEL ? ?Please do not use if you have an allergy to benzoyl peroxide.   If your skin becomes reddened/irritated stop using the benzoyl peroxide. ? ?Starting two days before surgery, apply as follows: ?Apply benzoyl peroxide in the morning and at night. Apply after taking a shower. If you are not taking a shower clean entire shoulder front, back, and side along with the armpit with a clean wet washcloth. ? ?Place a quarter-sized dollop on your shoulder and rub in thoroughly, making sure to cover the front, back, and side of your shoulder, along with the armpit.  ? ?2 days before ____ AM   ____ PM              1 day before ____ AM   ____ PM ? ? ? ?                        ? Do this twice a day for two days.  (Last application is the night before surgery, AFTER using the CHG soap as described below). ? ?Do NOT apply benzoyl peroxide gel on the day of surgery. ? ?CHLORHEXIDINE GLUCONATE (CHG) SOAP ? ?Please do not use if you have an allergy to CHG or antibacterial soaps. If your skin becomes reddened/irritated stop using the CHG.  ? ?Do not shave (including legs and underarms) for at least 48 hours prior to first CHG shower. It is OK to shave your face. ? ?Starting the night before surgery, use CHG soap as follows: ? ?Shower the NIGHT BEFORE SURGERY and MORNING OF SURGERY with CHG. ? ?If you choose to wash your hair, wash your hair first as usual with your normal shampoo. ? ?After shampooing, rinse your hair and body thoroughly to remove the shampoo. ? ?Use CHG as you would any other liquid soap.  You can apply CHG directly to the skin and wash gently with a scrungie or a clean washcloth. ? ?Apply the CHG soap to your body ONLY FROM THE NECK DOWN.  Do not use on open wounds or open sores.  Avoid contact with your eyes, ears, mouth, and genitals (private parts).  Wash face  and genitals (private parts) with your normal soap. ? ?Wash thoroughly, paying special attention to the area where your surgery will be performed. ? ?Thoroughly rinse your body with warm water from the neck down. ? ?DO NOT shower/wash with your normal soap after using and rinsing off the CHG soap. ? ? ?Pat yourself dry with a CLEAN TOWEL.  ? ? Apply benzoyl peroxide.  ? ?Wear CLEAN PAJAMAS to bed the night before surgery;  wear comfortable clothes the morning of surgery. ? ?Place CLEAN SHEETS on your bed the night of your first shower and DO NOT SLEEP WITH PETS. ? ? ? ?Day of Surgery: ?Take a shower with CHG soap. ?Do not wear jewelry or makeup ?Do not wear lotions, powders, perfumes, or deodorant. ?Do not shave 48 hours prior to surgery.   ?Do not bring valuables to the hospital.  ?[facility] is not responsible for any belongings or valuables. ?Do not wear nail polish, gel polish, artificial nails, or any other type of covering on natural nails (fingers and toes) ?If you have artificial nails or gel coating that need to be removed by a nail salon, please have this removed prior to surgery. Artificial nails or gel coating may interfere with anesthesia's ability to adequately monitor your vital signs. ?Wear Clean/Comfortable clothing the morning of surgery ?Do not apply any deodorants/lotions.   ?Remember to brush your teeth WITH YOUR REGULAR TOOTHPASTE. ?  ?Please read over the following fact sheets that you were given. ? ? ?3 days prior to your procedure or After your COVID test ? ?? You are not required to quarantine however you are required to wear a well-fitting mask when you are out and around people not in your household. If your mask becomes wet or soiled, replace with a new one. ? ?? Wash your hands often with soap and water for 20 seconds or clean your hands with an alcohol-based hand sanitizer that contains at least 60% alcohol. ? ?? Do not share personal items. ? ?? Notify your provider: ? ?o if you are  in close contact with someone who has COVID ? ?o or if you develop a fever of 100.4 or greater, sneezing, cough, sore throat, shortness of breath or body aches.  ? ?

## 2021-10-08 ENCOUNTER — Encounter (HOSPITAL_COMMUNITY): Payer: Self-pay

## 2021-10-08 ENCOUNTER — Encounter (HOSPITAL_COMMUNITY)
Admission: RE | Admit: 2021-10-08 | Discharge: 2021-10-08 | Disposition: A | Discharge status: Home / Self Care | Payer: Medicare Other | Source: Ambulatory Visit | Attending: Orthopedic Surgery | Admitting: Orthopedic Surgery

## 2021-10-08 ENCOUNTER — Other Ambulatory Visit: Payer: Self-pay

## 2021-10-08 VITALS — BP 106/66 | HR 69 | Temp 98.3°F | Resp 18 | Ht 66.0 in | Wt 176.9 lb

## 2021-10-08 DIAGNOSIS — Z8661 Personal history of infections of the central nervous system: Secondary | ICD-10-CM | POA: Insufficient documentation

## 2021-10-08 DIAGNOSIS — R569 Unspecified convulsions: Secondary | ICD-10-CM | POA: Diagnosis not present

## 2021-10-08 DIAGNOSIS — Z01812 Encounter for preprocedural laboratory examination: Secondary | ICD-10-CM | POA: Diagnosis not present

## 2021-10-08 DIAGNOSIS — F1190 Opioid use, unspecified, uncomplicated: Secondary | ICD-10-CM | POA: Insufficient documentation

## 2021-10-08 DIAGNOSIS — M19011 Primary osteoarthritis, right shoulder: Secondary | ICD-10-CM | POA: Insufficient documentation

## 2021-10-08 DIAGNOSIS — K219 Gastro-esophageal reflux disease without esophagitis: Secondary | ICD-10-CM | POA: Diagnosis not present

## 2021-10-08 DIAGNOSIS — Z01818 Encounter for other preprocedural examination: Secondary | ICD-10-CM

## 2021-10-08 DIAGNOSIS — Z87440 Personal history of urinary (tract) infections: Secondary | ICD-10-CM | POA: Diagnosis not present

## 2021-10-08 DIAGNOSIS — I129 Hypertensive chronic kidney disease with stage 1 through stage 4 chronic kidney disease, or unspecified chronic kidney disease: Secondary | ICD-10-CM | POA: Insufficient documentation

## 2021-10-08 LAB — CBC
HCT: 43.6 % (ref 36.0–46.0)
Hemoglobin: 14.7 g/dL (ref 12.0–15.0)
MCH: 32.5 pg (ref 26.0–34.0)
MCHC: 33.7 g/dL (ref 30.0–36.0)
MCV: 96.5 fL (ref 80.0–100.0)
Platelets: 251 10*3/uL (ref 150–400)
RBC: 4.52 MIL/uL (ref 3.87–5.11)
RDW: 14.4 % (ref 11.5–15.5)
WBC: 7.5 10*3/uL (ref 4.0–10.5)
nRBC: 0 % (ref 0.0–0.2)

## 2021-10-08 LAB — URINALYSIS, ROUTINE W REFLEX MICROSCOPIC
Bilirubin Urine: NEGATIVE
Glucose, UA: NEGATIVE mg/dL
Hgb urine dipstick: NEGATIVE
Ketones, ur: NEGATIVE mg/dL
Nitrite: NEGATIVE
Protein, ur: NEGATIVE mg/dL
Specific Gravity, Urine: 1.02 (ref 1.005–1.030)
WBC, UA: >50 WBC/hpf — ABNORMAL HIGH (ref 0–5)
pH: 5 (ref 5.0–8.0)

## 2021-10-08 LAB — BASIC METABOLIC PANEL
Anion gap: 11 (ref 5–15)
BUN: 24 mg/dL — ABNORMAL HIGH (ref 8–23)
CO2: 22 mmol/L (ref 22–32)
Calcium: 9.8 mg/dL (ref 8.9–10.3)
Chloride: 103 mmol/L (ref 98–111)
Creatinine, Ser: 1.31 mg/dL — ABNORMAL HIGH (ref 0.44–1.00)
GFR, Estimated: 45 mL/min — ABNORMAL LOW (ref >60)
Glucose, Bld: 85 mg/dL (ref 70–99)
Potassium: 4.6 mmol/L (ref 3.5–5.1)
Sodium: 136 mmol/L (ref 135–145)

## 2021-10-08 LAB — SURGICAL PCR SCREEN
MRSA, PCR: NEGATIVE
Staphylococcus aureus: NEGATIVE

## 2021-10-08 NOTE — Progress Notes (Addendum)
PCP - Dr. Legrand Como Hilts ?Cardiologist - Dr. Angelena Form ? ?PPM/ICD - n/a ? ?Chest x-ray - 03/28/21 ?EKG - 03/29/21 ?Stress Test - denies ?ECHO - 06/26/21 ?Cardiac Cath - denies ? ?Sleep Study - denies ?CPAP - denies ? ?Blood Thinner Instructions: n/a ?Aspirin Instructions: n/a ? ?ERAS Protcol - Clear liquids until 0830 DOS. ?PRE-SURGERY Ensure or G2- not given, pt had a bottle of pre-surgical ensure from previous surgery that was cancelled 2 months ago.  ? ?COVID TEST- Scheduled for 10/11/21 at 1305 in PAT.  ? ?Anesthesia review: Yes, previous cancelled surgery d/t abnormal labs. Pt also had cardiac clearance prior to cancelled surgery. ? ?Patient denies shortness of breath, fever, cough and chest pain at PAT appointment ? ? ?All instructions explained to the patient, with a verbal understanding of the material. Patient agrees to go over the instructions while at home for a better understanding. Patient also instructed to self quarantine after being tested for COVID-19. The opportunity to ask questions was provided. ? ? ?

## 2021-10-08 NOTE — Progress Notes (Signed)
LVM with Myer Haff, surgical scheduler for Dr. Marlou Sa, regarding pt's abnormal UA. ? ?Jacqlyn Larsen, RN ? ?

## 2021-10-09 ENCOUNTER — Encounter (HOSPITAL_COMMUNITY): Payer: Self-pay | Admitting: Vascular Surgery

## 2021-10-09 ENCOUNTER — Encounter (HOSPITAL_COMMUNITY): Payer: Self-pay | Admitting: Certified Registered Nurse Anesthetist

## 2021-10-09 NOTE — Progress Notes (Addendum)
Anesthesia Chart Review:  Case: [id number] Date/Time: 10/15/21 1115   Procedure: RIGHT REVERSE SHOULDER ARTHROPLASTY (Right: Shoulder)   Anesthesia type: General   Pre-op diagnosis: RIGHT SHOULDER OSTEOARTHRITIS   Location: [facility] OR ROOM 06 / [facility]   Surgeons: Meredith Pel, MD       DISCUSSION: Patient is a 66 year old female scheduled for the above procedure.  Surgery was initially scheduled for 07/18/2021, but it was postponed for positive E. coli urine culture and newly elevated Creatinine of 1.85 up from 0.52 on 04/04/21.   History includes former smoker, HTN, murmur (trivial MR 06/2021), CKD (stage III), GERD, meningitis, recurrent UTI (admission 10/2020 & 03/2021 E. Coli UTI; out-patient treatment E. Coli UTI 07/2021), seizures, opiate substance abuse (accidental OD 09/2018; benzo withdrawal 01/2020, 02/2020 & 03/2021), spinal surgery.   - [facility] admission 03/28/21-04/05/21 for sepsis due to you UTI and E. coli bacteremia that was resistant to Bactrim. Also with acute metabolic encephalopathy thought related to sepsis.  BP medications also adjusted for poorly controlled hypertension. (Coreg changed to metoprolol tartrate 100 mg twice daily, lisinopril 20 mg daily added, continue amlodipine 5 mg daily.)   - [facility] admission 03/17/21-03/21/21 for AMS/acute metabolic encephalopathy and hypertensive urgency in setting of running out of of Suboxone, gabapentin, tizanidine, Klonopin, and amlodipine.  Head CT and MRI of the brain did not show acute findings.  Critical care consulted for severe agitation felt likely related to medication withdrawal.  Symptoms improved with resumption of buprenorphine, gabapentin, duloxetine, and as needed clonazepam and tizanidine.  Amlodipine and carvedilol also resumed. Urine culture also grew E. Coli (resulted after discharge). Patient had been discharged on Bactrim with recommendation for PCP and urology follow-up, as she did not want to wait for final culture  result.   She had recent cardiology evaluation by Dr. Angelena Form on 05/14/2021 for preoperative cardiac evaluation with history of admission with hypertensive urgency in August 2022.  August CXR showed possible cardiomegaly, and 03/28/21 EKG with possible prolonged QTc. Echo ordered and showed normal LV function, EF 55 to 60%, trivial MR.  Dr. Angelena Form felt she could "proceed with her planned surgical procedure."   She had new nephrology evaluation in January 2023 at at [facility] with Dr. Hollie Salk for AKI. Last visit 09/02/21. 08/22/21 Renal US was unremarkable; however Dr. Hollie Salk noted that "size discrepancy of kidneys and variable BP suggest otherwise." She thinks patient may need a MRA to more definitively rule on renal artery stenosis, but did not think this test should delay surgery. Repeat Creatinine at [facility] on 09/02/21 was 1.24, so Dr. Hollie Salk wrote, "OK for surg". She thinks patient's AKI in 07/2021 likely related to 3 added BP medications including ACEi and was also on Bactrim, which has since been stopped. Creatinine from 10/08/21 showed a Creatinine of 1.3 which is overall stable since January.  Of note, it now appears that she has an upcoming [facility] appointment on 10/18/21.   She was last evaluated at [facility] on 08/09/21 for recurrent UTI follow-up, likely related to vaginal atrophy and colonization. Given history of urosepsis and upcoming surgery, they agrees to start her on daily low dose Trimethoprim for the next 3-6 months. She was also started on a vaginal estrogen cream and was referred for pelvic floor PT. 10/08/21 UA showed large leukocytes, negative nitrites. Urine cultures showed multiple species present, suggest recollection. This was communicated to [place] at Dr. Randel Pigg office. Dr. Marlou Sa will likely have her repeat a urine culture  prior to surgery.   Preoperative COVID-19 test is scheduled for 10/11/2021.  Anesthesia team to evaluate on the day of surgery.   VS:  BP 106/66    Pulse 69    Temp 36.8 C (Oral)    Resp 18    Ht '5\' 6"'  (1.676 m)    Wt 80.2 kg    SpO2 98%    BMI 28.55 kg/m    PROVIDERS: Hilts, Legrand Como, MD is PCP  - Lauree Chandler, MD is cardiologist.  - Erik Obey, FNP is urology provider ([facility] - [place]). Last visit 06/17/21. She previously saw Arnette Schaumann, MD.  - She has initial consult with Nephrology on 10/18/21 with [facility] nephrologist Derrill Center, MD. She was evaluated by Madelon Lips, at [facility] in January 2023, last 09/02/21.     LABS: Preoperative labs noted.  (all labs ordered are listed, but only abnormal results are displayed)  Labs Reviewed  URINE CULTURE - Abnormal; Notable for the following components:      Result Value   Culture MULTIPLE SPECIES PRESENT, SUGGEST RECOLLECTION (*)    All other components within normal limits  URINALYSIS, ROUTINE W REFLEX MICROSCOPIC - Abnormal; Notable for the following components:   APPearance CLOUDY (*)    Leukocytes,Ua LARGE (*)    WBC, UA >50 (*)    Bacteria, UA MANY (*)    All other components within normal limits  BASIC METABOLIC PANEL - Abnormal; Notable for the following components:   BUN 24 (*)    Creatinine, Ser 1.31 (*)    GFR, Estimated 45 (*)    All other components within normal limits  SURGICAL PCR SCREEN  CBC  09/02/21 Cr 1.24, BUN 20, eGFR 48 at [facility].   EEG 01/28/20: IMPRESSION: This study is suggestive of moderate diffuse encephalopathy, nonspecific etiology but likely related to toxic-metabolic etiology. No seizures or definite epileptiform discharges were seen throughout the recording.     IMAGES: US Renal 08/22/21: IMPRESSION: 1. No significant sonographic abnormality of the kidneys. 2. No Doppler evidence of significant renal artery stenosis.  CT Right shoulder 05/21/21: IMPRESSION: 1. Subacute avulsion fractures of the greater tuberosity at the supraspinatus and infraspinatus  tendon attachments with retraction of the bony fragments to the glenoid. 2. Mild glenohumeral and acromioclavicular osteoarthritis.   CT Head 03/29/21: IMPRESSION: No evidence of acute intracranial abnormality.   CXR 03/28/21: FINDINGS: Cardiomegaly. Both lungs are clear. Disc degenerative disease of the thoracic spine. IMPRESSION: Cardiomegaly without acute abnormality of the lungs.     EKG: 03/28/21 13:21:59 Sinus tachycardia Low voltage QRS Nonspecific ST abnormality Critical Prolonged QT [QT 372 ms, QTcB 551 ms] Abnormal ECG Sinus tachycardia, disagree with QTc measure Confirmed by Lavenia Atlas [phone]) on 03/28/2021 1:27:33 PM     CV: Echo 06/26/21: IMPRESSIONS   1. Left ventricular ejection fraction, by estimation, is 55 to 60%. Left  ventricular ejection fraction by 3D volume is 60 %. The left ventricle has  normal function. The left ventricle has no regional wall motion  abnormalities. Left ventricular diastolic   parameters were normal.   2. Right ventricular systolic function is normal. The right ventricular  size is normal.   3. The mitral valve is normal in structure. Trivial mitral valve  regurgitation.   4. The aortic valve is normal in structure. Aortic valve regurgitation is  not visualized. No aortic stenosis is present.     Past Medical History:  Diagnosis Date   Anxiety  Arthritis    Chronic kidney disease    Depression    GERD (gastroesophageal reflux disease)    Heart murmur    Hypertension    Insomnia    Meningitis    Pneumonia    Recurrent UTI    Seizures (HCC)    Substance abuse ([place])     Past Surgical History:  Procedure Laterality Date   BACK SURGERY     CERVICAL DISC SURGERY     FRACTURE SURGERY N/A    [employer] 05/07/2020   SPINE SURGERY N/A    [employer] 05/07/2020    MEDICATIONS:  acetaminophen (TYLENOL) 500 MG tablet   amLODipine (NORVASC) 5 MG tablet   buprenorphine (SUBUTEX) 2 MG SUBL SL tablet   clonazePAM  (KLONOPIN) 0.5 MG tablet   diclofenac Sodium (VOLTAREN) 1 % GEL   diphenhydrAMINE (BENADRYL) 50 MG tablet   DULoxetine (CYMBALTA) 60 MG capsule   estradiol (ESTRACE) 0.1 MG/GM vaginal cream   gabapentin (NEURONTIN) 800 MG tablet   HYDROcodone-acetaminophen (NORCO) 10-325 MG tablet   LINZESS 72 MCG capsule   lisinopril (ZESTRIL) 10 MG tablet   lisinopril (ZESTRIL) 20 MG tablet   metoprolol tartrate (LOPRESSOR) 100 MG tablet   Multiple Vitamins-Minerals (CENTRUM SILVER 50+WOMEN) TABS   NORVASC 10 MG tablet   pantoprazole (PROTONIX) 40 MG tablet   rosuvastatin (CRESTOR) 10 MG tablet   tiZANidine (ZANAFLEX) 4 MG tablet   trimethoprim (TRIMPEX) 100 MG tablet   No current facility-administered medications for this encounter.    Myra Gianotti, PA-C Surgical Short Stay/Anesthesiology [facility] Phone [phone] [facility] Phone [phone] 10/10/2021 11:38 AM

## 2021-10-10 ENCOUNTER — Encounter: Payer: Self-pay | Admitting: Orthopedic Surgery

## 2021-10-10 ENCOUNTER — Other Ambulatory Visit: Payer: Self-pay | Admitting: Surgical

## 2021-10-10 DIAGNOSIS — Z87440 Personal history of urinary (tract) infections: Secondary | ICD-10-CM

## 2021-10-10 LAB — URINE CULTURE

## 2021-10-10 NOTE — Telephone Encounter (Signed)
Thank you sheri!

## 2021-10-10 NOTE — Progress Notes (Signed)
Thanks Revonda Standard.  Appreciate your conscientious efforts as always.  We have reordered urinalysis and urine culture but I feel pretty good about her getting surgery based on this info.  Thanks again

## 2021-10-10 NOTE — Anesthesia Preprocedure Evaluation (Deleted)
Anesthesia Evaluation  ? ? ?Airway ? ? ? ? ? ? ? Dental ?  ?Pulmonary ?former smoker,  ?  ? ? ? ? ? ? ? Cardiovascular ?hypertension,  ? ? ?  ?Neuro/Psych ?  ? GI/Hepatic ?  ?Endo/Other  ? ? Renal/GU ?  ? ?  ?Musculoskeletal ? ? Abdominal ?  ?Peds ? Hematology ?  ?Anesthesia Other Findings ? ? Reproductive/Obstetrics ? ?  ? ? ? ? ? ? ? ? ? ? ? ? ? ?  ?  ? ? ? ? ? ? ? ? ?Anesthesia Physical ?Anesthesia Plan ? ?ASA:  ? ?Anesthesia Plan:   ? ?Post-op Pain Management:   ? ?Induction:  ? ?PONV Risk Score and Plan:  ? ?Airway Management Planned:  ? ?Additional Equipment:  ? ?Intra-op Plan:  ? ?Post-operative Plan:  ? ?Informed Consent:  ? ?Plan Discussed with:  ? ?Anesthesia Plan Comments: (PAT note written 10/10/2021 by Shonna Chock, PA-C. ?)  ? ? ? ? ? ? ?Anesthesia Quick Evaluation ? ?

## 2021-10-10 NOTE — Progress Notes (Signed)
Hi Stephanie Carrillo, can we get another UA and urine culture? Likely make final decision for or against surgery  based on those results and the notes from Urology/Nephrology.  Thank you guys

## 2021-10-10 NOTE — Progress Notes (Signed)
Discussed further with Dr Marlou Sa.  Lurena Joiner will place order. I talked with Ebony Hail and she provided number for [facility] who I left message for letting her know patient to come between 9-3 tomorrow, Friday to have repeat UA/culture.  Tried calling patient to advise, no answer. LMVM with details.  I will try to reach patient again first thing Friday morning.  ?

## 2021-10-11 ENCOUNTER — Inpatient Hospital Stay (HOSPITAL_COMMUNITY): Admission: RE | Admit: 2021-10-11 | Payer: Medicare Other | Source: Ambulatory Visit

## 2021-10-11 ENCOUNTER — Other Ambulatory Visit (HOSPITAL_COMMUNITY)
Admission: RE | Admit: 2021-10-11 | Discharge: 2021-10-11 | Disposition: A | Discharge status: Home / Self Care | Payer: Medicare Other | Source: Ambulatory Visit | Attending: Orthopedic Surgery | Admitting: Orthopedic Surgery

## 2021-10-11 DIAGNOSIS — Z20822 Contact with and (suspected) exposure to covid-19: Secondary | ICD-10-CM | POA: Insufficient documentation

## 2021-10-11 DIAGNOSIS — Z87440 Personal history of urinary (tract) infections: Secondary | ICD-10-CM | POA: Insufficient documentation

## 2021-10-11 DIAGNOSIS — Z01818 Encounter for other preprocedural examination: Secondary | ICD-10-CM

## 2021-10-11 DIAGNOSIS — Z01812 Encounter for preprocedural laboratory examination: Secondary | ICD-10-CM | POA: Diagnosis not present

## 2021-10-11 LAB — URINALYSIS, ROUTINE W REFLEX MICROSCOPIC
Bilirubin Urine: NEGATIVE
Glucose, UA: NEGATIVE mg/dL
Ketones, ur: NEGATIVE mg/dL
Nitrite: POSITIVE — AB
Protein, ur: NEGATIVE mg/dL
Specific Gravity, Urine: 1.017 (ref 1.005–1.030)
WBC, UA: >50 WBC/hpf — ABNORMAL HIGH (ref 0–5)
pH: 5 (ref 5.0–8.0)

## 2021-10-11 LAB — SARS CORONAVIRUS 2 (TAT 6-24 HRS): SARS Coronavirus 2: NEGATIVE

## 2021-10-14 ENCOUNTER — Encounter: Payer: Self-pay | Admitting: Orthopedic Surgery

## 2021-10-14 NOTE — Telephone Encounter (Signed)
Can we cancel her surgery and see patients starting at 1 PM tmrw?

## 2021-10-15 ENCOUNTER — Encounter (HOSPITAL_COMMUNITY): Admission: RE | Payer: Self-pay | Source: Home / Self Care

## 2021-10-15 ENCOUNTER — Ambulatory Visit (HOSPITAL_COMMUNITY): Admission: RE | Admit: 2021-10-15 | Payer: Medicare Other | Source: Home / Self Care | Admitting: Orthopedic Surgery

## 2021-10-15 DIAGNOSIS — Z01818 Encounter for other preprocedural examination: Secondary | ICD-10-CM

## 2021-10-15 LAB — URINE CULTURE: Culture: >=100000 — AB

## 2021-10-15 SURGERY — ARTHROPLASTY, SHOULDER, TOTAL, REVERSE
Anesthesia: General | Site: Shoulder | Laterality: Right

## 2021-10-18 DIAGNOSIS — N390 Urinary tract infection, site not specified: Secondary | ICD-10-CM | POA: Diagnosis not present

## 2021-10-23 DIAGNOSIS — S62101S Fracture of unspecified carpal bone, right wrist, sequela: Secondary | ICD-10-CM | POA: Diagnosis not present

## 2021-10-23 DIAGNOSIS — M75121 Complete rotator cuff tear or rupture of right shoulder, not specified as traumatic: Secondary | ICD-10-CM | POA: Diagnosis not present

## 2021-10-23 DIAGNOSIS — Z79899 Other long term (current) drug therapy: Secondary | ICD-10-CM | POA: Diagnosis not present

## 2021-10-23 DIAGNOSIS — M25511 Pain in right shoulder: Secondary | ICD-10-CM | POA: Diagnosis not present

## 2021-10-23 DIAGNOSIS — G8929 Other chronic pain: Secondary | ICD-10-CM | POA: Diagnosis not present

## 2021-10-29 ENCOUNTER — Ambulatory Visit: Admission: RE | Admit: 2021-10-29 | Payer: Medicare Other | Source: Ambulatory Visit

## 2021-10-30 ENCOUNTER — Encounter: Payer: Medicare Other | Admitting: Orthopedic Surgery

## 2021-11-01 ENCOUNTER — Ambulatory Visit
Admission: RE | Admit: 2021-11-01 | Discharge: 2021-11-01 | Disposition: A | Discharge status: Home / Self Care | Payer: Medicare Other | Source: Ambulatory Visit | Attending: Family Medicine | Admitting: Family Medicine

## 2021-11-01 DIAGNOSIS — Z780 Asymptomatic menopausal state: Secondary | ICD-10-CM | POA: Diagnosis not present

## 2021-11-01 DIAGNOSIS — Z13820 Encounter for screening for osteoporosis: Secondary | ICD-10-CM

## 2021-11-26 DIAGNOSIS — G8929 Other chronic pain: Secondary | ICD-10-CM | POA: Diagnosis not present

## 2021-11-26 DIAGNOSIS — S62101S Fracture of unspecified carpal bone, right wrist, sequela: Secondary | ICD-10-CM | POA: Diagnosis not present

## 2021-11-26 DIAGNOSIS — M75121 Complete rotator cuff tear or rupture of right shoulder, not specified as traumatic: Secondary | ICD-10-CM | POA: Diagnosis not present

## 2021-11-26 DIAGNOSIS — M25511 Pain in right shoulder: Secondary | ICD-10-CM | POA: Diagnosis not present

## 2021-12-16 NOTE — Telephone Encounter (Signed)
err

## 2021-12-26 DIAGNOSIS — M75121 Complete rotator cuff tear or rupture of right shoulder, not specified as traumatic: Secondary | ICD-10-CM | POA: Diagnosis not present

## 2021-12-26 DIAGNOSIS — G8929 Other chronic pain: Secondary | ICD-10-CM | POA: Diagnosis not present

## 2021-12-26 DIAGNOSIS — F3181 Bipolar II disorder: Secondary | ICD-10-CM | POA: Diagnosis not present

## 2021-12-26 DIAGNOSIS — M25511 Pain in right shoulder: Secondary | ICD-10-CM | POA: Diagnosis not present

## 2021-12-31 DIAGNOSIS — I129 Hypertensive chronic kidney disease with stage 1 through stage 4 chronic kidney disease, or unspecified chronic kidney disease: Secondary | ICD-10-CM | POA: Diagnosis not present

## 2021-12-31 DIAGNOSIS — N390 Urinary tract infection, site not specified: Secondary | ICD-10-CM | POA: Diagnosis not present

## 2021-12-31 DIAGNOSIS — M75100 Unspecified rotator cuff tear or rupture of unspecified shoulder, not specified as traumatic: Secondary | ICD-10-CM | POA: Diagnosis not present

## 2021-12-31 DIAGNOSIS — N179 Acute kidney failure, unspecified: Secondary | ICD-10-CM | POA: Diagnosis not present

## 2022-01-10 DIAGNOSIS — N390 Urinary tract infection, site not specified: Secondary | ICD-10-CM | POA: Diagnosis not present

## 2022-01-26 DIAGNOSIS — F3181 Bipolar II disorder: Secondary | ICD-10-CM | POA: Diagnosis not present

## 2022-01-26 DIAGNOSIS — M25511 Pain in right shoulder: Secondary | ICD-10-CM | POA: Diagnosis not present

## 2022-01-26 DIAGNOSIS — M75121 Complete rotator cuff tear or rupture of right shoulder, not specified as traumatic: Secondary | ICD-10-CM | POA: Diagnosis not present

## 2022-01-26 DIAGNOSIS — S62101S Fracture of unspecified carpal bone, right wrist, sequela: Secondary | ICD-10-CM | POA: Diagnosis not present

## 2022-02-05 ENCOUNTER — Ambulatory Visit: Payer: Medicare Other | Admitting: Neurology

## 2022-02-05 ENCOUNTER — Encounter: Payer: Self-pay | Admitting: Neurology

## 2022-02-05 VITALS — BP 74/55 | HR 68 | Ht 64.0 in

## 2022-02-05 DIAGNOSIS — G8929 Other chronic pain: Secondary | ICD-10-CM

## 2022-02-05 DIAGNOSIS — R269 Unspecified abnormalities of gait and mobility: Secondary | ICD-10-CM

## 2022-02-05 DIAGNOSIS — R296 Repeated falls: Secondary | ICD-10-CM

## 2022-02-05 NOTE — Progress Notes (Signed)
[facility]  PATIENT: Stephanie Carrillo DOB: 01-18-1956  REQUESTING CLINICIAN: Lavada Mesi, MD HISTORY FROM: Patient  REASON FOR VISIT: Multiple falls    HISTORICAL  CHIEF COMPLAINT:  Chief Complaint  Patient presents with   New Patient (Initial Visit)    Rm 15. Alone. NP/Paper/[facility]/Michel Hilts MD/frequent falls, hx seizures.    HISTORY OF PRESENT ILLNESS:  This is a 66 year old woman with past medical history including history of seizures, chronic pain, recent right shoulder fracture, CKD, gait abnormality and hypertension who is presenting with recurrent falls.  Patient reports with these falls she does not have any warning signs, she is walking and next and that she knows she is on the ground.  She states sometimes she feels like she is about to fall but with the majority of the falls no warning.  Sometimes she is confused after the falls.  With these falls, she had injuries including the right shoulder fracture and bruises.  For history of seizure, she reported having seizure as a child and again in her 67s, she was previously on antiseizure medications but was able to get off medication.  Since then she has not had any recurrent seizures Patient reports 6 months ago she was admitted, found to have a CKD, she did have physical therapy at that time and was recommended to use a walker.  She is not using the walker as directed.  On top of that she has chronic pain and she is on multiple medications including buprenorphine, clonazepam, gabapentin, hydrocodone, tizanidine, Benadryl.  On top of that she is also on 4 antihypertensive medications including amlodipine, lisinopril, Lasix, and metoprolol   OTHER MEDICAL CONDITIONS: Chronic pain, right shoulder fracture, CKD, gait abnormality, hypertension    REVIEW OF SYSTEMS: Full 14 system review of systems performed and negative with exception of: as noted in the HPI   ALLERGIES: Allergies  Allergen  Reactions   Ciprofloxacin Shortness Of Breath    Respiratory arrest   Penicillins Shortness Of Breath and Rash    Did it involve swelling of the face/tongue/throat, SOB, or low BP? y Did it involve sudden or severe rash/hives, skin peeling, or any reaction on the inside of your mouth or nose? y Did you need to seek medical attention at a hospital or doctor's office? n When did it last happen?  1985 If all above answers are "NO", may proceed with cephalosporin use.  Tolerated Ceftriaxone 10/05/20 - 10/07/20   Oxycodone Other (See Comments)    Patient in recovery process.    HOME MEDICATIONS: Outpatient Medications Prior to Visit  Medication Sig Dispense Refill   acetaminophen (TYLENOL) 500 MG tablet Take 2 tablets (1,000 mg total) by mouth every 6 (six) hours as needed. 30 tablet 0   azithromycin (ZITHROMAX) 250 MG tablet Take 1 tablet by mouth as directed.     buprenorphine (SUBUTEX) 2 MG SUBL SL tablet Place 2 mg under the tongue 2 (two) times daily.     clonazePAM (KLONOPIN) 0.5 MG tablet Take 1 tablet (0.5 mg total) by mouth 3 (three) times daily as needed for anxiety. 90 tablet 1   Daridorexant HCl (QUVIVIQ PO) Take by mouth.     diclofenac Sodium (VOLTAREN) 1 % GEL Apply 2 g topically daily as needed (pain).     diphenhydrAMINE (BENADRYL) 50 MG tablet Take 150 mg by mouth at bedtime as needed.     DULoxetine (CYMBALTA) 60 MG capsule Take 1 capsule (60 mg total) by mouth daily. 90  capsule 1   gabapentin (NEURONTIN) 800 MG tablet Take 800 mg by mouth 3 (three) times daily.     HYDROcodone-acetaminophen (NORCO) 10-325 MG tablet Take 1 tablet by mouth in the morning, at noon, in the evening, and at bedtime.     LASIX 40 MG tablet Take 1 tablet by mouth daily as needed.     LINZESS 72 MCG capsule Take 72 mcg by mouth daily.     lisinopril (ZESTRIL) 10 MG tablet Take 10 mg by mouth daily.     metoprolol tartrate (LOPRESSOR) 100 MG tablet Take 1 tablet (100 mg total) by mouth 2 (two) times  daily.     Multiple Vitamins-Minerals (CENTRUM SILVER 50+WOMEN) TABS Take 1 tablet by mouth daily.     nitrofurantoin, macrocrystal-monohydrate, (MACROBID) 100 MG capsule Take 100 mg by mouth 2 (two) times daily.     NORVASC 10 MG tablet Take 10 mg by mouth daily.     pantoprazole (PROTONIX) 40 MG tablet Take 1 tablet (40 mg total) by mouth daily at 6 (six) AM. 30 tablet 1   rosuvastatin (CRESTOR) 10 MG tablet Take 10 mg by mouth at bedtime.     tiZANidine (ZANAFLEX) 4 MG tablet Take 4 mg by mouth 3 (three) times daily.     trimethoprim (TRIMPEX) 100 MG tablet Take 100 mg by mouth daily.     amLODipine (NORVASC) 5 MG tablet Take 1 tablet (5 mg total) by mouth daily. (Patient not taking: Reported on 10/03/2021) 30 tablet 2   estradiol (ESTRACE) 0.1 MG/GM vaginal cream Place 1 Applicatorful vaginally 2 (two) times a week.     lisinopril (ZESTRIL) 20 MG tablet Take 1 tablet (20 mg total) by mouth daily. (Patient not taking: Reported on 10/03/2021)     No facility-administered medications prior to visit.    PAST MEDICAL HISTORY: Past Medical History:  Diagnosis Date   Anxiety    Arthritis    Chronic kidney disease    Depression    GERD (gastroesophageal reflux disease)    Heart murmur    Hypertension    Insomnia    Meningitis    Pneumonia    Recurrent UTI    Seizures (HCC)    Substance abuse (HCC)     PAST SURGICAL HISTORY: Past Surgical History:  Procedure Laterality Date   BACK SURGERY     CERVICAL DISC SURGERY     FRACTURE SURGERY N/A    [employer] 05/07/2020   SPINE SURGERY N/A    [employer] 05/07/2020    FAMILY HISTORY: Family History  Problem Relation Age of Onset   Multiple sclerosis Mother    Cancer Mother        Possibly breast or colon mets to brain   Cancer Father    Lung cancer Father    Heart disease Father    Cancer Brother    Testicular cancer Brother    Prostate cancer Brother    Skin cancer Brother    Colon polyps Brother    Colon polyps Brother      SOCIAL HISTORY: Social History   Socioeconomic History   Marital status: Divorced    Spouse name: Not on file   Number of children: 0   Years of education: Not on file   Highest education level: Not on file  Occupational History   Not on file  Tobacco Use   Smoking status: Former    Packs/day: 2.00    Years: 15.00    Total pack years: 30.00  Types: Cigarettes   Smokeless tobacco: Never  Vaping Use   Vaping Use: Never used  Substance and Sexual Activity   Alcohol use: Not Currently   Drug use: Not Currently    Comment: opiates -clean 6 months   Sexual activity: Not on file  Other Topics Concern   Not on file  Social History Narrative   Not on file   Social Determinants of Health   Financial Resource Strain: Not on file  Food Insecurity: Not on file  Transportation Needs: Not on file  Physical Activity: Not on file  Stress: Not on file  Social Connections: Not on file  Intimate Partner Violence: Not on file    PHYSICAL EXAM  GENERAL EXAM/CONSTITUTIONAL: Vitals:  Vitals:   02/05/22 1036  BP: (!) 74/55  Pulse: 68  Height: 5\' 4"  (1.626 m)   Body mass index is 30.36 kg/m. Wt Readings from Last 3 Encounters:  10/08/21 176 lb 14.4 oz (80.2 kg)  07/15/21 170 lb 1.6 oz (77.2 kg)  06/13/21 180 lb (81.6 kg)   Patient is in no distress; well developed, nourished and groomed; neck is supple  EYES: Pupils round and reactive to light, Visual fields full to confrontation, Extraocular movements intacts,   MUSCULOSKELETAL: Gait, strength, tone, movements noted in Neurologic exam below  NEUROLOGIC: MENTAL STATUS:      No data to display         awake, alert, oriented to person, place and time recent and remote memory intact normal attention and concentration language fluent, comprehension intact, naming intact fund of knowledge appropriate  CRANIAL NERVE:  2nd, 3rd, 4th, 6th - pupils equal and reactive to light, visual fields full to confrontation,  extraocular muscles intact, no nystagmus 5th - facial sensation symmetric 7th - facial strength symmetric 8th - hearing intact 9th - palate elevates symmetrically, uvula midline 11th - shoulder shrug symmetric 12th - tongue protrusion midline  MOTOR:  normal bulk and tone, full strength in the BUE, BLE. RUE extremity limited by pain  SENSORY:  normal and symmetric to light touch  COORDINATION:  finger-nose-finger, fine finger movements normal  REFLEXES:  deep tendon reflexes present and symmetric  GAIT/STATION:  Wide base gait     DIAGNOSTIC DATA (LABS, IMAGING, TESTING) - I reviewed patient records, labs, notes, testing and imaging myself where available.  Lab Results  Component Value Date   WBC 7.5 10/08/2021   HGB 14.7 10/08/2021   HCT 43.6 10/08/2021   MCV 96.5 10/08/2021   PLT 251 10/08/2021      Component Value Date/Time   NA 136 10/08/2021 1331   K 4.6 10/08/2021 1331   CL 103 10/08/2021 1331   CO2 22 10/08/2021 1331   GLUCOSE 85 10/08/2021 1331   BUN 24 (H) 10/08/2021 1331   CREATININE 1.31 (H) 10/08/2021 1331   CREATININE 0.76 10/26/2020 1408   CALCIUM 9.8 10/08/2021 1331   PROT 7.5 04/03/2021 0311   ALBUMIN 3.2 (L) 04/03/2021 0311   AST 22 04/03/2021 0311   ALT 24 04/03/2021 0311   ALKPHOS 78 04/03/2021 0311   BILITOT 0.6 04/03/2021 0311   GFRNONAA 45 (L) 10/08/2021 1331   GFRAA >60 02/24/2020 0451   Lab Results  Component Value Date   CHOL 241 (H) 10/26/2020   HDL 43 (L) 10/26/2020   LDLCALC  10/26/2020     Comment:     . LDL cholesterol not calculated. Triglyceride levels greater than 400 mg/dL invalidate calculated LDL results. . Reference range: <100 .  Desirable range <100 mg/dL for primary prevention;   <70 mg/dL for patients with CHD or diabetic patients  with > or = 2 CHD risk factors. Marland Kitchen LDL-C is now calculated using the Martin-Hopkins  calculation, which is a validated novel method providing  better accuracy than the  Friedewald equation in the  estimation of LDL-C.  Horald Pollen et al. Lenox Ahr. 7616;073(71): 2061-2068  (http://education.[employer].com/faq/FAQ164)    TRIG 520 (H) 10/26/2020   CHOLHDL 5.6 (H) 10/26/2020   Lab Results  Component Value Date   HGBA1C 5.1 10/26/2020   Lab Results  Component Value Date   VITAMINB12 720 03/18/2021   Lab Results  Component Value Date   TSH 1.498 03/18/2021    CT Head 03/29/2021 No evidence of acute intracranial abnormality.    ASSESSMENT AND PLAN  66 y.o. year old female with including history of seizures, chronic pain, recent right shoulder fracture, CKD, gait abnormality and hypertension who is presenting with recurrent falls.  Patient recurrent fall likely a combination of gait abnormality, and medications side effect.  She is on multiple pain medications including Buprenorphine, Klonopin, Benadryl, Gabapentin, hydrocodone, and tizanidine, on top of that she is on four antihypertensive medications.  She does have a history of gait abnormality, was seen by PT and recommended to use a walker which she has not been using as directed.  I strongly advised patient to use the walker as directed since she needs all these medications for her pain and also to control her blood pressure  Due to her history of seizures and reported confusion with some of these falls, I will obtain a routine EEG.  If normal, then patient can continue to follow-up with PCP for better management of her pain and also they can consider additional physical therapy if needed.  Follow-up as needed. If abnormal, then I will bring her back to discuss results and treatment if any.     1. Recurrent falls   2. Gait abnormality   3. Other chronic pain      Patient Instructions  Strongly recommend using a walker with ambulation Routine EEG , I will contact you to go over the results  Follow up with PCP and return as needed   Orders Placed This Encounter  Procedures   EEG adult     No orders of the defined types were placed in this encounter.   Return if symptoms worsen or fail to improve.  I have spent a total of 50 minutes dedicated to this patient today, preparing to see patient, performing a medically appropriate examination and evaluation, ordering tests and/or medications and procedures, and counseling and educating the patient/family/caregiver; independently interpreting result and communicating results to the family/patient/caregiver; and documenting clinical information in the electronic medical record.   Windell Norfolk, MD 02/05/2022, 11:28 AM  [facility] [address], [address] [place], [place] [address] [phone]

## 2022-02-05 NOTE — Patient Instructions (Addendum)
Strongly recommend using a walker with ambulation Routine EEG , I will contact you to go over the results  Follow up with PCP and return as needed

## 2022-02-23 DIAGNOSIS — M25511 Pain in right shoulder: Secondary | ICD-10-CM | POA: Diagnosis not present

## 2022-02-23 DIAGNOSIS — G8929 Other chronic pain: Secondary | ICD-10-CM | POA: Diagnosis not present

## 2022-02-23 DIAGNOSIS — S62101S Fracture of unspecified carpal bone, right wrist, sequela: Secondary | ICD-10-CM | POA: Diagnosis not present

## 2022-02-23 DIAGNOSIS — M75121 Complete rotator cuff tear or rupture of right shoulder, not specified as traumatic: Secondary | ICD-10-CM | POA: Diagnosis not present

## 2022-02-25 ENCOUNTER — Ambulatory Visit: Payer: Medicare Other | Admitting: Neurology

## 2022-02-25 DIAGNOSIS — R2990 Unspecified symptoms and signs involving the nervous system: Secondary | ICD-10-CM | POA: Diagnosis not present

## 2022-02-25 DIAGNOSIS — R296 Repeated falls: Secondary | ICD-10-CM

## 2022-02-25 NOTE — Procedures (Signed)
    History:  66 year old woman with history of seizure, now with multiple falls.   EEG classification: Awake and drowsy  Description of the recording: The background rhythms of this recording consists of a fairly well modulated medium amplitude alpha rhythm of 8.5 Hz that is reactive to eye opening and closure. As the record progresses, the patient appears to remain in the waking state throughout the recording. Photic stimulation was performed, did not show any abnormalities. Hyperventilation was also performed, did not show any abnormalities. Toward the end of the recording, the patient enters the drowsy state with slight symmetric slowing seen. The patient never enters stage II sleep. No abnormal epileptiform discharges seen during this recording. There was no focal slowing. EKG monitor shows no evidence of cardiac rhythm abnormalities with a heart rate of 60.  Abnormality: None   Impression: This is a normal EEG recording in the waking and drowsy state. No evidence of interictal epileptiform discharges seen. A normal EEG does not exclude a diagnosis of epilepsy.    Windell Norfolk, MD [facility]

## 2022-03-03 ENCOUNTER — Telehealth: Payer: Self-pay | Admitting: Orthopedic Surgery

## 2022-03-03 DIAGNOSIS — M25511 Pain in right shoulder: Secondary | ICD-10-CM

## 2022-03-03 NOTE — Telephone Encounter (Signed)
Patient called wanting to schedule right reverse shoulder surgery, and states she has been cleared by Dr. Signe Colt (nephrologist) since her urine is showing no bacterial growth, urinalysis is ok and creatinine and bun are normal. A new request for clearance can be sent if patient is to proceed with scheduling.  She was scheduled for right reverse shoulder March 14th of this year, but was cancelled because of abnormal labs.  Josh with [employer] says the patient will need to have another scan for R-RSA because the block has expired.  Please advise is if a neurology clearance is also needed.  It appears recently the patient has had recurrent falls.

## 2022-03-03 NOTE — Telephone Encounter (Signed)
Yes for new scan.  Yes for neurology evaluation in terms of balance issues.  That would be great.  Thanks

## 2022-03-12 ENCOUNTER — Inpatient Hospital Stay (HOSPITAL_COMMUNITY)
Admission: EM | Admit: 2022-03-12 | Discharge: 2022-03-15 | DRG: 871 | Disposition: A | Discharge status: Home / Self Care | Payer: Medicare Other | Attending: Internal Medicine | Admitting: Internal Medicine

## 2022-03-12 ENCOUNTER — Other Ambulatory Visit: Payer: Self-pay

## 2022-03-12 ENCOUNTER — Emergency Department (HOSPITAL_COMMUNITY): Payer: Medicare Other

## 2022-03-12 ENCOUNTER — Encounter (HOSPITAL_COMMUNITY): Payer: Self-pay

## 2022-03-12 DIAGNOSIS — E785 Hyperlipidemia, unspecified: Secondary | ICD-10-CM | POA: Diagnosis present

## 2022-03-12 DIAGNOSIS — M4803 Spinal stenosis, cervicothoracic region: Secondary | ICD-10-CM | POA: Diagnosis not present

## 2022-03-12 DIAGNOSIS — I10 Essential (primary) hypertension: Secondary | ICD-10-CM | POA: Diagnosis not present

## 2022-03-12 DIAGNOSIS — Z79899 Other long term (current) drug therapy: Secondary | ICD-10-CM

## 2022-03-12 DIAGNOSIS — Z79891 Long term (current) use of opiate analgesic: Secondary | ICD-10-CM | POA: Diagnosis not present

## 2022-03-12 DIAGNOSIS — F419 Anxiety disorder, unspecified: Secondary | ICD-10-CM | POA: Diagnosis not present

## 2022-03-12 DIAGNOSIS — Z880 Allergy status to penicillin: Secondary | ICD-10-CM

## 2022-03-12 DIAGNOSIS — R339 Retention of urine, unspecified: Secondary | ICD-10-CM | POA: Diagnosis not present

## 2022-03-12 DIAGNOSIS — A419 Sepsis, unspecified organism: Principal | ICD-10-CM

## 2022-03-12 DIAGNOSIS — E860 Dehydration: Secondary | ICD-10-CM | POA: Diagnosis present

## 2022-03-12 DIAGNOSIS — E876 Hypokalemia: Secondary | ICD-10-CM | POA: Diagnosis present

## 2022-03-12 DIAGNOSIS — Z20822 Contact with and (suspected) exposure to covid-19: Secondary | ICD-10-CM | POA: Diagnosis not present

## 2022-03-12 DIAGNOSIS — R7989 Other specified abnormal findings of blood chemistry: Secondary | ICD-10-CM

## 2022-03-12 DIAGNOSIS — G9341 Metabolic encephalopathy: Secondary | ICD-10-CM | POA: Diagnosis not present

## 2022-03-12 DIAGNOSIS — F32A Depression, unspecified: Secondary | ICD-10-CM | POA: Diagnosis not present

## 2022-03-12 DIAGNOSIS — N3001 Acute cystitis with hematuria: Secondary | ICD-10-CM | POA: Diagnosis present

## 2022-03-12 DIAGNOSIS — K219 Gastro-esophageal reflux disease without esophagitis: Secondary | ICD-10-CM | POA: Diagnosis present

## 2022-03-12 DIAGNOSIS — G4700 Insomnia, unspecified: Secondary | ICD-10-CM | POA: Diagnosis present

## 2022-03-12 DIAGNOSIS — B9620 Unspecified Escherichia coli [E. coli] as the cause of diseases classified elsewhere: Secondary | ICD-10-CM | POA: Diagnosis present

## 2022-03-12 DIAGNOSIS — Z881 Allergy status to other antibiotic agents status: Secondary | ICD-10-CM

## 2022-03-12 DIAGNOSIS — Z87440 Personal history of urinary (tract) infections: Secondary | ICD-10-CM

## 2022-03-12 DIAGNOSIS — R231 Pallor: Secondary | ICD-10-CM | POA: Diagnosis not present

## 2022-03-12 DIAGNOSIS — M4802 Spinal stenosis, cervical region: Secondary | ICD-10-CM | POA: Diagnosis not present

## 2022-03-12 DIAGNOSIS — Z1623 Resistance to quinolones and fluoroquinolones: Secondary | ICD-10-CM | POA: Diagnosis not present

## 2022-03-12 DIAGNOSIS — N179 Acute kidney failure, unspecified: Secondary | ICD-10-CM | POA: Diagnosis present

## 2022-03-12 DIAGNOSIS — M50323 Other cervical disc degeneration at C6-C7 level: Secondary | ICD-10-CM | POA: Diagnosis not present

## 2022-03-12 DIAGNOSIS — R000 Tachycardia, unspecified: Secondary | ICD-10-CM | POA: Diagnosis not present

## 2022-03-12 DIAGNOSIS — R6520 Severe sepsis without septic shock: Secondary | ICD-10-CM | POA: Diagnosis present

## 2022-03-12 DIAGNOSIS — Z8661 Personal history of infections of the central nervous system: Secondary | ICD-10-CM

## 2022-03-12 DIAGNOSIS — Z87891 Personal history of nicotine dependence: Secondary | ICD-10-CM

## 2022-03-12 DIAGNOSIS — M1990 Unspecified osteoarthritis, unspecified site: Secondary | ICD-10-CM | POA: Diagnosis present

## 2022-03-12 DIAGNOSIS — G894 Chronic pain syndrome: Secondary | ICD-10-CM | POA: Diagnosis not present

## 2022-03-12 DIAGNOSIS — M47813 Spondylosis without myelopathy or radiculopathy, cervicothoracic region: Secondary | ICD-10-CM | POA: Diagnosis not present

## 2022-03-12 DIAGNOSIS — R230 Cyanosis: Secondary | ICD-10-CM | POA: Diagnosis not present

## 2022-03-12 DIAGNOSIS — E872 Acidosis, unspecified: Secondary | ICD-10-CM

## 2022-03-12 DIAGNOSIS — R404 Transient alteration of awareness: Secondary | ICD-10-CM | POA: Diagnosis not present

## 2022-03-12 DIAGNOSIS — Z885 Allergy status to narcotic agent status: Secondary | ICD-10-CM

## 2022-03-12 DIAGNOSIS — N390 Urinary tract infection, site not specified: Secondary | ICD-10-CM | POA: Diagnosis not present

## 2022-03-12 DIAGNOSIS — G9340 Encephalopathy, unspecified: Secondary | ICD-10-CM | POA: Diagnosis not present

## 2022-03-12 DIAGNOSIS — R4182 Altered mental status, unspecified: Secondary | ICD-10-CM | POA: Diagnosis not present

## 2022-03-12 LAB — CBC WITH DIFFERENTIAL/PLATELET
Abs Immature Granulocytes: 0.16 10*3/uL — ABNORMAL HIGH (ref 0.00–0.07)
Basophils Absolute: 0.1 10*3/uL (ref 0.0–0.1)
Basophils Relative: 0 %
Eosinophils Absolute: 0 10*3/uL (ref 0.0–0.5)
Eosinophils Relative: 0 %
HCT: 49 % — ABNORMAL HIGH (ref 36.0–46.0)
Hemoglobin: 17 g/dL — ABNORMAL HIGH (ref 12.0–15.0)
Immature Granulocytes: 1 %
Lymphocytes Relative: 13 %
Lymphs Abs: 2.6 10*3/uL (ref 0.7–4.0)
MCH: 33.8 pg (ref 26.0–34.0)
MCHC: 34.7 g/dL (ref 30.0–36.0)
MCV: 97.4 fL (ref 80.0–100.0)
Monocytes Absolute: 2.2 10*3/uL — ABNORMAL HIGH (ref 0.1–1.0)
Monocytes Relative: 11 %
Neutro Abs: 15.3 10*3/uL — ABNORMAL HIGH (ref 1.7–7.7)
Neutrophils Relative %: 75 %
Platelets: 370 10*3/uL (ref 150–400)
RBC: 5.03 MIL/uL (ref 3.87–5.11)
RDW: 13 % (ref 11.5–15.5)
WBC: 20.2 10*3/uL — ABNORMAL HIGH (ref 4.0–10.5)
nRBC: 0 % (ref 0.0–0.2)

## 2022-03-12 LAB — CBG MONITORING, ED: Glucose-Capillary: 124 mg/dL — ABNORMAL HIGH (ref 70–99)

## 2022-03-12 LAB — COMPREHENSIVE METABOLIC PANEL
ALT: 53 U/L — ABNORMAL HIGH (ref 0–44)
AST: 58 U/L — ABNORMAL HIGH (ref 15–41)
Albumin: 4.7 g/dL (ref 3.5–5.0)
Alkaline Phosphatase: 126 U/L (ref 38–126)
Anion gap: 17 — ABNORMAL HIGH (ref 5–15)
BUN: 33 mg/dL — ABNORMAL HIGH (ref 8–23)
CO2: 19 mmol/L — ABNORMAL LOW (ref 22–32)
Calcium: 9.8 mg/dL (ref 8.9–10.3)
Chloride: 99 mmol/L (ref 98–111)
Creatinine, Ser: 1.35 mg/dL — ABNORMAL HIGH (ref 0.44–1.00)
GFR, Estimated: 43 mL/min — ABNORMAL LOW (ref >60)
Glucose, Bld: 98 mg/dL (ref 70–99)
Potassium: 3.9 mmol/L (ref 3.5–5.1)
Sodium: 135 mmol/L (ref 135–145)
Total Bilirubin: 1.4 mg/dL — ABNORMAL HIGH (ref 0.3–1.2)
Total Protein: 9 g/dL — ABNORMAL HIGH (ref 6.5–8.1)

## 2022-03-12 LAB — URINALYSIS, ROUTINE W REFLEX MICROSCOPIC
Bilirubin Urine: NEGATIVE
Glucose, UA: NEGATIVE mg/dL
Ketones, ur: 5 mg/dL — AB
Nitrite: NEGATIVE
Protein, ur: 100 mg/dL — AB
Specific Gravity, Urine: 1.015 (ref 1.005–1.030)
WBC, UA: >50 WBC/hpf — ABNORMAL HIGH (ref 0–5)
pH: 5 (ref 5.0–8.0)

## 2022-03-12 LAB — RAPID URINE DRUG SCREEN, HOSP PERFORMED
Amphetamines: NOT DETECTED
Barbiturates: NOT DETECTED
Benzodiazepines: POSITIVE — AB
Cocaine: NOT DETECTED
Opiates: POSITIVE — AB
Tetrahydrocannabinol: NOT DETECTED

## 2022-03-12 LAB — SARS CORONAVIRUS 2 BY RT PCR: SARS Coronavirus 2 by RT PCR: NEGATIVE

## 2022-03-12 LAB — ETHANOL: Alcohol, Ethyl (B): <10 mg/dL (ref <10)

## 2022-03-12 LAB — LACTIC ACID, PLASMA: Lactic Acid, Venous: 3.5 mmol/L (ref 0.5–1.9)

## 2022-03-12 MED ORDER — SODIUM CHLORIDE 0.9 % IV SOLN
2.0000 g | INTRAVENOUS | Status: DC
  Administered 2022-03-12 – 2022-03-14 (×3): 2 g via INTRAVENOUS
  Filled 2022-03-12 (×3): qty 20

## 2022-03-12 MED ORDER — SODIUM CHLORIDE 0.9 % IV BOLUS
1000.0000 mL | Freq: Once | INTRAVENOUS | Status: AC
  Administered 2022-03-12: 1000 mL via INTRAVENOUS

## 2022-03-12 MED ORDER — SODIUM CHLORIDE 0.9 % IV BOLUS
1000.0000 mL | Freq: Once | INTRAVENOUS | Status: AC
Start: 2022-03-12 — End: 2022-03-12
  Administered 2022-03-12: 1000 mL via INTRAVENOUS

## 2022-03-12 MED ORDER — LACTATED RINGERS IV SOLN: INTRAVENOUS | Status: DC

## 2022-03-12 NOTE — ED Notes (Signed)
Patient transported to CT 

## 2022-03-12 NOTE — ED Triage Notes (Signed)
Patient brother call the patient and patient was not able to answer. Patient brother call for welfare check. Patient was found outside of her home with alert mental status, cold and cyanotic with RR 24

## 2022-03-12 NOTE — ED Provider Notes (Signed)
[facility]-EMERGENCY DEPT Provider Note   CSN: [id number] Arrival date & time: 03/12/22  1817     History  Chief Complaint  Patient presents with   Altered Mental Status    Stephanie Carrillo is a 66 y.o. female.  Pt is a 66 yo female with a pmhx significant for HTN, seizures (not on seizure meds and nl EEG in July), chronic pain, UTIs, substance abuse, ckd, gerd, and arthritis.  Pt's brother checks on her daily.  He could not get ahold of pt today, so he called EMS for a well check.  EMS found her outside her home on the ground.  She had AMS, was cold, cyanotic in the lips, and hypotensive.  EMS did not give her any meds or fluids as they were unable to establish access.  However, pt is awake, bp nl, and O2 nl here.  Pt denies any pain.       Home Medications Prior to Admission medications   Medication Sig Start Date End Date Taking? Authorizing Provider  acetaminophen (TYLENOL) 500 MG tablet Take 2 tablets (1,000 mg total) by mouth every 6 (six) hours as needed. 04/05/21   Meredeth Ide, MD  azithromycin (ZITHROMAX) 250 MG tablet Take 1 tablet by mouth as directed. 01/29/22   [provider]  buprenorphine (SUBUTEX) 2 MG SUBL SL tablet Place 2 mg under the tongue 2 (two) times daily. 07/02/21   [provider]  clonazePAM (KLONOPIN) 0.5 MG tablet Take 1 tablet (0.5 mg total) by mouth 3 (three) times daily as needed for anxiety. 04/15/21   Eldred Manges, MD  Daridorexant HCl (QUVIVIQ PO) Take by mouth.    [provider]  diclofenac Sodium (VOLTAREN) 1 % GEL Apply 2 g topically daily as needed (pain).    [provider]  diphenhydrAMINE (BENADRYL) 50 MG tablet Take 150 mg by mouth at bedtime as needed.    [provider]  DULoxetine (CYMBALTA) 60 MG capsule Take 1 capsule (60 mg total) by mouth daily. 03/26/21   Hilts, Casimiro Needle, MD  gabapentin (NEURONTIN) 800 MG tablet Take 800 mg by mouth 3 (three) times daily.    [provider]  HYDROcodone-acetaminophen (NORCO) 10-325 MG tablet Take 1 tablet by mouth in the morning, at noon, in the evening, and at bedtime. 06/04/21   [provider]  LASIX 40 MG tablet Take 1 tablet by mouth daily as needed. 11/25/21   [provider]  LINZESS 72 MCG capsule Take 72 mcg by mouth daily. 09/22/21   [provider]  lisinopril (ZESTRIL) 10 MG tablet Take 10 mg by mouth daily. 09/10/21   [provider]  metoprolol tartrate (LOPRESSOR) 100 MG tablet Take 1 tablet (100 mg total) by mouth 2 (two) times daily. 04/05/21   Meredeth Ide, MD  Multiple Vitamins-Minerals (CENTRUM SILVER 50+WOMEN) TABS Take 1 tablet by mouth daily.    [provider]  nitrofurantoin, macrocrystal-monohydrate, (MACROBID) 100 MG capsule Take 100 mg by mouth 2 (two) times daily.    [provider]  NORVASC 10 MG tablet Take 10 mg by mouth daily. 09/10/21   [provider]  pantoprazole (PROTONIX) 40 MG tablet Take 1 tablet (40 mg total) by mouth daily at 6 (six) AM. 10/08/20   Rodolph Bong, MD  rosuvastatin (CRESTOR) 10 MG tablet Take 10 mg by mouth at bedtime. 07/02/21   [provider]  tiZANidine (ZANAFLEX) 4 MG tablet Take 4 mg by mouth 3 (  three) times daily. 05/18/21   [provider]  trimethoprim (TRIMPEX) 100 MG tablet Take 100 mg by mouth daily. 06/17/21   [provider]      Allergies    Ciprofloxacin, Penicillins, and Oxycodone    Review of Systems   Review of Systems  Neurological:  Positive for weakness.  All other systems reviewed and are negative.   Physical Exam Updated Vital Signs BP (!) 154/88   Pulse (!) 110   Temp 98.2 F (36.8 C) (Rectal)   Resp 17   SpO2 99%  Physical Exam Vitals and nursing note reviewed.  Constitutional:      Appearance: Normal appearance.  HENT:     Head: Normocephalic and atraumatic.     Right Ear: External ear normal.     Left Ear: External ear normal.      Nose: Nose normal.     Mouth/Throat:     Mouth: Mucous membranes are dry.  Eyes:     Extraocular Movements: Extraocular movements intact.     Conjunctiva/sclera: Conjunctivae normal.     Pupils: Pupils are equal, round, and reactive to light.  Cardiovascular:     Rate and Rhythm: Regular rhythm. Tachycardia present.     Pulses: Normal pulses.     Heart sounds: Normal heart sounds.  Pulmonary:     Effort: Pulmonary effort is normal.     Breath sounds: Normal breath sounds.  Abdominal:     General: Abdomen is flat. Bowel sounds are normal.     Palpations: Abdomen is soft.  Musculoskeletal:        General: Normal range of motion.     Cervical back: Normal range of motion and neck supple.  Skin:    General: Skin is warm.     Capillary Refill: Capillary refill takes less than 2 seconds.  Neurological:     Mental Status: She is alert and oriented to person, place, and time.  Psychiatric:        Mood and Affect: Mood is anxious.     ED Results / Procedures / Treatments   Labs (all labs ordered are listed, but only abnormal results are displayed) Labs Reviewed  CBC WITH DIFFERENTIAL/PLATELET - Abnormal; Notable for the following components:      Result Value   WBC 20.2 (*)    Hemoglobin 17.0 (*)    HCT 49.0 (*)    Neutro Abs 15.3 (*)    Monocytes Absolute 2.2 (*)    Abs Immature Granulocytes 0.16 (*)    All other components within normal limits  URINALYSIS, ROUTINE W REFLEX MICROSCOPIC - Abnormal; Notable for the following components:   APPearance HAZY (*)    Hgb urine dipstick MODERATE (*)    Ketones, ur 5 (*)    Protein, ur 100 (*)    Leukocytes,Ua MODERATE (*)    WBC, UA >50 (*)    Bacteria, UA MANY (*)    All other components within normal limits  RAPID URINE DRUG SCREEN, HOSP PERFORMED - Abnormal; Notable for the following components:   Opiates POSITIVE (*)    Benzodiazepines POSITIVE (*)    All other components within normal limits  COMPREHENSIVE METABOLIC PANEL  - Abnormal; Notable for the following components:   CO2 19 (*)    BUN 33 (*)    Creatinine, Ser 1.35 (*)    Total Protein 9.0 (*)    AST 58 (*)    ALT 53 (*)    Total Bilirubin 1.4 (*)  GFR, Estimated 43 (*)    Anion gap 17 (*)    All other components within normal limits  LACTIC ACID, PLASMA - Abnormal; Notable for the following components:   Lactic Acid, Venous 3.5 (*)    All other components within normal limits  CBG MONITORING, ED - Abnormal; Notable for the following components:   Glucose-Capillary 124 (*)    All other components within normal limits  SARS CORONAVIRUS 2 BY RT PCR  CULTURE, BLOOD (ROUTINE X 2)  CULTURE, BLOOD (ROUTINE X 2)  ETHANOL  LACTIC ACID, PLASMA  LACTIC ACID, PLASMA    EKG EKG Interpretation  Date/Time:  Wednesday March 12 2022 18:30:27 EDT Ventricular Rate:  118 PR Interval:  140 QRS Duration: 74 QT Interval:  336 QTC Calculation: 471 R Axis:   63 Text Interpretation: Sinus tachycardia Probable left atrial enlargement Borderline repolarization abnormality No significant change since last tracing Confirmed by Jacalyn Lefevre [phone]) on 03/12/2022 7:09:40 PM  Radiology CT Head Wo Contrast  Result Date: 03/12/2022 CLINICAL DATA:  Altered mental status EXAM: CT HEAD WITHOUT CONTRAST CT CERVICAL SPINE WITHOUT CONTRAST TECHNIQUE: Multidetector CT imaging of the head and cervical spine was performed following the standard protocol without intravenous contrast. Multiplanar CT image reconstructions of the cervical spine were also generated. RADIATION DOSE REDUCTION: This exam was performed according to the departmental dose-optimization program which includes automated exposure control, adjustment of the mA and/or kV according to patient size and/or use of iterative reconstruction technique. COMPARISON:  03/29/2021 CT head, no prior CT cervical spine FINDINGS: CT HEAD FINDINGS Brain: No evidence of acute infarct, hemorrhage, mass, mass effect, or midline  shift. No hydrocephalus or extra-axial fluid collection. Vascular: No hyperdense vessel. Skull: Hyperostosis frontalis. Negative for fracture or focal lesion. Sinuses/Orbits: No acute finding. Other: The mastoid air cells are well aerated. CT CERVICAL SPINE FINDINGS Evaluation is somewhat limited by motion artifact. Alignment: Straightening of the normal cervical lordosis. Skull base and vertebrae: ACDF C6-C7. Evidence of osseous fusion across C5-T1, although evaluation is limited by motion. No definite acute fracture. No suspicious osseous lesion. Soft tissues and spinal canal: No prevertebral fluid or swelling. No visible canal hematoma. Disc levels: Multilevel degenerative changes, with ossification along the posterior aspect of C6, C7, and T1 causing severe spinal canal stenosis. Upper chest: No focal pulmonary opacity in the imaged lungs. Other: None. IMPRESSION: 1.  No acute intracranial process. 2. Evaluation of the cervical spine is limited by motion. Within this limitation, no acute osseous abnormality. 3. Ossification along the posterior aspect C6, C7, and T1 causes severe spinal canal stenosis. Electronically Signed   By: Wiliam Ke M.D.   On: 03/12/2022 19:17   CT Cervical Spine Wo Contrast  Result Date: 03/12/2022 CLINICAL DATA:  Altered mental status EXAM: CT HEAD WITHOUT CONTRAST CT CERVICAL SPINE WITHOUT CONTRAST TECHNIQUE: Multidetector CT imaging of the head and cervical spine was performed following the standard protocol without intravenous contrast. Multiplanar CT image reconstructions of the cervical spine were also generated. RADIATION DOSE REDUCTION: This exam was performed according to the departmental dose-optimization program which includes automated exposure control, adjustment of the mA and/or kV according to patient size and/or use of iterative reconstruction technique. COMPARISON:  03/29/2021 CT head, no prior CT cervical spine FINDINGS: CT HEAD FINDINGS Brain: No evidence of  acute infarct, hemorrhage, mass, mass effect, or midline shift. No hydrocephalus or extra-axial fluid collection. Vascular: No hyperdense vessel. Skull: Hyperostosis frontalis. Negative for fracture or focal lesion. Sinuses/Orbits: No acute finding.  Other: The mastoid air cells are well aerated. CT CERVICAL SPINE FINDINGS Evaluation is somewhat limited by motion artifact. Alignment: Straightening of the normal cervical lordosis. Skull base and vertebrae: ACDF C6-C7. Evidence of osseous fusion across C5-T1, although evaluation is limited by motion. No definite acute fracture. No suspicious osseous lesion. Soft tissues and spinal canal: No prevertebral fluid or swelling. No visible canal hematoma. Disc levels: Multilevel degenerative changes, with ossification along the posterior aspect of C6, C7, and T1 causing severe spinal canal stenosis. Upper chest: No focal pulmonary opacity in the imaged lungs. Other: None. IMPRESSION: 1.  No acute intracranial process. 2. Evaluation of the cervical spine is limited by motion. Within this limitation, no acute osseous abnormality. 3. Ossification along the posterior aspect C6, C7, and T1 causes severe spinal canal stenosis. Electronically Signed   By: Merilyn Baba M.D.   On: 03/12/2022 19:17   DG Chest Portable 1 View  Result Date: 03/12/2022 CLINICAL DATA:  Altered mental status EXAM: PORTABLE CHEST 1 VIEW COMPARISON:  03/28/2021 FINDINGS: Heart and mediastinal contours are within normal limits. No focal opacities or effusions. No acute bony abnormality. IMPRESSION: No active disease. Electronically Signed   By: Rolm Baptise M.D.   On: 03/12/2022 19:05    Procedures Procedures    Medications Ordered in ED Medications  lactated ringers infusion (has no administration in time range)  cefTRIAXone (ROCEPHIN) 2 g in sodium chloride 0.9 % 100 mL IVPB (has no administration in time range)  sodium chloride 0.9 % bolus 1,000 mL (has no administration in time range)   sodium chloride 0.9 % bolus 1,000 mL (0 mLs Intravenous Stopped 03/12/22 2254)  sodium chloride 0.9 % bolus 1,000 mL (0 mLs Intravenous Stopped 03/12/22 2254)    ED Course/ Medical Decision Making/ A&P                           Medical Decision Making Amount and/or Complexity of Data Reviewed Labs: ordered. Radiology: ordered.  Risk Prescription drug management. Decision regarding hospitalization.   This patient presents to the ED for concern of ams, this involves an extensive number of treatment options, and is a complaint that carries with it a high risk of complications and morbidity.  The differential diagnosis includes infection, drug od, electrolyte abn   Co morbidities that complicate the patient evaluation  HTN, seizures (not on seizure meds and nl EEG in July), chronic pain, UTIs, substance abuse, ckd, gerd, and arthritis   Additional history obtained:  Additional history obtained from epic chart review External records from outside source obtained and reviewed including EMS report   Lab Tests:  I Ordered, and personally interpreted labs.  The pertinent results include:  wbc elevated at 20.2; cmp with Cr 1.35 (chronic), UA with + UTI; covid neg; UDS + opiates and BZD   Imaging Studies ordered:  I ordered imaging studies including cxr, ct head/ct cervical spine  I independently visualized and interpreted imaging which showed  Cxr: IMPRESSION:  No active disease.  CT head and c-spine: IMPRESSION:  1.  No acute intracranial process.  2. Evaluation of the cervical spine is limited by motion. Within  this limitation, no acute osseous abnormality.  3. Ossification along the posterior aspect C6, C7, and T1 causes  severe spinal canal stenosis    I agree with the radiologist interpretation   Cardiac Monitoring:  The patient was maintained on a cardiac monitor.  I personally viewed and interpreted the  cardiac monitored which showed an underlying rhythm of: sinus  tachy   Medicines ordered and prescription drug management:  I ordered medication including rocephin  for uti  Reevaluation of the patient after these medicines showed that the patient improved I have reviewed the patients home medicines and have made adjustments as needed   Test Considered:  ct   Critical Interventions:  Ivfs/abx   Consultations Obtained:  I requested consultation with the hospitalist (Dr. Flossie Buffy),  and discussed lab and imaging findings as well as pertinent plan - she will admit   Problem List / ED Course:  AMS:  Improved with IVFs.  Initially, I thought it was secondary to a possible drug od.  However, it is more likely due to sepsis.  Pt is now awake and alert and looks much better than she did when she arrived. Metabolic encephalopathy:  likely due to UTI. Pt given rocephin.   Reevaluation:  After the interventions noted above, I reevaluated the patient and found that they have :improved   Social Determinants of Health:  Lives at home   Dispostion:  After consideration of the diagnostic results and the patients response to treatment, I feel that the patent would benefit from admission.    CRITICAL CARE Performed by: Isla Pence   Total critical care time: 30 minutes  Critical care time was exclusive of separately billable procedures and treating other patients.  Critical care was necessary to treat or prevent imminent or life-threatening deterioration.  Critical care was time spent personally by me on the following activities: development of treatment plan with patient and/or surrogate as well as nursing, discussions with consultants, evaluation of patient's response to treatment, examination of patient, obtaining history from patient or surrogate, ordering and performing treatments and interventions, ordering and review of laboratory studies, ordering and review of radiographic studies, pulse oximetry and re-evaluation of patient's  condition.         Final Clinical Impression(s) / ED Diagnoses Final diagnoses:  Acute metabolic encephalopathy  Dehydration  Acute cystitis with hematuria    Rx / DC Orders ED Discharge Orders     None         Isla Pence, MD 03/12/22 2310

## 2022-03-12 NOTE — ED Notes (Signed)
Blood work recollected

## 2022-03-12 NOTE — ED Notes (Signed)
IV team in room at this time.  

## 2022-03-12 NOTE — ED Notes (Signed)
Family in room at this time. 

## 2022-03-12 NOTE — Progress Notes (Signed)
IV attempted x2 with [place] and unsuccessful. Observed veins in upper arm too deep for midline. Pt currently has 22G in wrist with fluids infusing. Per covering RN, unknown if pt will be admitted. Notified her of the above.

## 2022-03-12 NOTE — Progress Notes (Signed)
On arrival for IV consult, pt up to bedside commode. VAST to return at later time.

## 2022-03-12 NOTE — ED Notes (Signed)
Pt stated while it felt like she needed to urinate, she was unable to.

## 2022-03-12 NOTE — Progress Notes (Signed)
Received consult for IV. Noted IV documented on flowsheet. At this time, no indication for 2nd site noted. Secure chat sent to RN to verify.

## 2022-03-13 DIAGNOSIS — A419 Sepsis, unspecified organism: Secondary | ICD-10-CM

## 2022-03-13 DIAGNOSIS — N390 Urinary tract infection, site not specified: Secondary | ICD-10-CM | POA: Diagnosis not present

## 2022-03-13 DIAGNOSIS — R7989 Other specified abnormal findings of blood chemistry: Secondary | ICD-10-CM

## 2022-03-13 DIAGNOSIS — E872 Acidosis, unspecified: Secondary | ICD-10-CM

## 2022-03-13 LAB — COMPREHENSIVE METABOLIC PANEL
ALT: 39 U/L (ref 0–44)
AST: 38 U/L (ref 15–41)
Albumin: 3.5 g/dL (ref 3.5–5.0)
Alkaline Phosphatase: 93 U/L (ref 38–126)
Anion gap: 11 (ref 5–15)
BUN: 23 mg/dL (ref 8–23)
CO2: 20 mmol/L — ABNORMAL LOW (ref 22–32)
Calcium: 8.5 mg/dL — ABNORMAL LOW (ref 8.9–10.3)
Chloride: 104 mmol/L (ref 98–111)
Creatinine, Ser: 0.79 mg/dL (ref 0.44–1.00)
GFR, Estimated: >60 mL/min (ref >60)
Glucose, Bld: 85 mg/dL (ref 70–99)
Potassium: 2.9 mmol/L — ABNORMAL LOW (ref 3.5–5.1)
Sodium: 135 mmol/L (ref 135–145)
Total Bilirubin: 1 mg/dL (ref 0.3–1.2)
Total Protein: 6.8 g/dL (ref 6.5–8.1)

## 2022-03-13 LAB — BLOOD CULTURE ID PANEL (REFLEXED) - BCID2

## 2022-03-13 LAB — CBC
HCT: 39.6 % (ref 36.0–46.0)
Hemoglobin: 13.4 g/dL (ref 12.0–15.0)
MCH: 34.3 pg — ABNORMAL HIGH (ref 26.0–34.0)
MCHC: 33.8 g/dL (ref 30.0–36.0)
MCV: 101.3 fL — ABNORMAL HIGH (ref 80.0–100.0)
Platelets: 222 10*3/uL (ref 150–400)
RBC: 3.91 MIL/uL (ref 3.87–5.11)
RDW: 12.9 % (ref 11.5–15.5)
WBC: 16.9 10*3/uL — ABNORMAL HIGH (ref 4.0–10.5)
nRBC: 0 % (ref 0.0–0.2)

## 2022-03-13 LAB — LACTIC ACID, PLASMA: Lactic Acid, Venous: 1 mmol/L (ref 0.5–1.9)

## 2022-03-13 MED ORDER — HYDROCODONE-ACETAMINOPHEN 10-325 MG PO TABS
1.0000 | ORAL_TABLET | Freq: Four times a day (QID) | ORAL | Status: DC | PRN
  Administered 2022-03-13 – 2022-03-14 (×3): 1 via ORAL
  Filled 2022-03-13 (×3): qty 1

## 2022-03-13 MED ORDER — METOPROLOL TARTRATE 50 MG PO TABS
100.0000 mg | ORAL_TABLET | Freq: Two times a day (BID) | ORAL | Status: DC
  Administered 2022-03-13 – 2022-03-15 (×6): 100 mg via ORAL
  Filled 2022-03-13 (×2): qty 2
  Filled 2022-03-13: qty 4
  Filled 2022-03-13 (×2): qty 2
  Filled 2022-03-13: qty 4

## 2022-03-13 MED ORDER — DULOXETINE HCL 30 MG PO CPEP
60.0000 mg | ORAL_CAPSULE | Freq: Every day | ORAL | Status: DC
  Administered 2022-03-13 – 2022-03-15 (×3): 60 mg via ORAL
  Filled 2022-03-13 (×3): qty 2

## 2022-03-13 MED ORDER — POTASSIUM CHLORIDE CRYS ER 20 MEQ PO TBCR
40.0000 meq | EXTENDED_RELEASE_TABLET | ORAL | Status: AC
  Administered 2022-03-13 (×2): 40 meq via ORAL
  Filled 2022-03-13 (×2): qty 2

## 2022-03-13 MED ORDER — GABAPENTIN 400 MG PO CAPS
400.0000 mg | ORAL_CAPSULE | Freq: Three times a day (TID) | ORAL | Status: DC
  Administered 2022-03-13 – 2022-03-15 (×7): 400 mg via ORAL
  Filled 2022-03-13 (×7): qty 1

## 2022-03-13 MED ORDER — LINACLOTIDE 72 MCG PO CAPS
72.0000 ug | ORAL_CAPSULE | Freq: Every day | ORAL | Status: DC
  Administered 2022-03-13 – 2022-03-15 (×3): 72 ug via ORAL
  Filled 2022-03-13 (×3): qty 1

## 2022-03-13 MED ORDER — BUPRENORPHINE HCL 2 MG SL SUBL
2.0000 mg | SUBLINGUAL_TABLET | Freq: Two times a day (BID) | SUBLINGUAL | Status: DC
  Administered 2022-03-13 – 2022-03-15 (×5): 2 mg via SUBLINGUAL
  Filled 2022-03-13 (×5): qty 1

## 2022-03-13 MED ORDER — CLONAZEPAM 0.5 MG PO TABS
0.5000 mg | ORAL_TABLET | Freq: Three times a day (TID) | ORAL | Status: DC | PRN
  Administered 2022-03-13 – 2022-03-14 (×3): 0.5 mg via ORAL
  Filled 2022-03-13 (×3): qty 1

## 2022-03-13 MED ORDER — ROSUVASTATIN CALCIUM 10 MG PO TABS
10.0000 mg | ORAL_TABLET | Freq: Every day | ORAL | Status: DC
  Administered 2022-03-13 – 2022-03-14 (×3): 10 mg via ORAL
  Filled 2022-03-13 (×3): qty 1

## 2022-03-13 MED ORDER — ENOXAPARIN SODIUM 40 MG/0.4ML IJ SOSY
40.0000 mg | PREFILLED_SYRINGE | INTRAMUSCULAR | Status: DC
  Administered 2022-03-14 – 2022-03-15 (×2): 40 mg via SUBCUTANEOUS
  Filled 2022-03-13 (×3): qty 0.4

## 2022-03-13 MED ORDER — LACTATED RINGERS IV SOLN: INTRAVENOUS | Status: AC

## 2022-03-13 NOTE — ED Notes (Signed)
Pt requesting klonopin and pain medication

## 2022-03-13 NOTE — Assessment & Plan Note (Signed)
Secondary to sepsis.  Continuous IV fluid overnight.

## 2022-03-13 NOTE — Progress Notes (Signed)
Sepsis tracking by eLINK 

## 2022-03-13 NOTE — Progress Notes (Signed)
PHARMACY - PHYSICIAN COMMUNICATION CRITICAL VALUE ALERT - BLOOD CULTURE IDENTIFICATION (BCID)  Stephanie Carrillo is an 66 y.o. female who presented to [facility] on 03/12/2022 with a chief complaint of altered mental status.  Assessment:  1 of 3 bottles Staph species, possible contaminant  Name of physician (or Provider) Contacted: Dr. Sunnie Nielsen  Current antibiotics: Ceftriaxone for sepsis due to UTI.  Changes to prescribed antibiotics recommended:  Recommendations accepted by provider - continue current therapy for UTI, monitor closely and consider addition of vancomycin if develops fever.  Results for orders placed or performed during the hospital encounter of 03/12/22  Blood Culture ID Panel (Reflexed) (Collected: 03/12/2022  7:44 PM)  Result Value Ref Range   Enterococcus faecalis NOT DETECTED NOT DETECTED   Enterococcus Faecium NOT DETECTED NOT DETECTED   Listeria monocytogenes NOT DETECTED NOT DETECTED   Staphylococcus species DETECTED (A) NOT DETECTED   Staphylococcus aureus (BCID) NOT DETECTED NOT DETECTED   Staphylococcus epidermidis NOT DETECTED NOT DETECTED   Staphylococcus lugdunensis NOT DETECTED NOT DETECTED   Streptococcus species NOT DETECTED NOT DETECTED   Streptococcus agalactiae NOT DETECTED NOT DETECTED   Streptococcus pneumoniae NOT DETECTED NOT DETECTED   Streptococcus pyogenes NOT DETECTED NOT DETECTED   A.calcoaceticus-baumannii NOT DETECTED NOT DETECTED   Bacteroides fragilis NOT DETECTED NOT DETECTED   Enterobacterales NOT DETECTED NOT DETECTED   Enterobacter cloacae complex NOT DETECTED NOT DETECTED   Escherichia coli NOT DETECTED NOT DETECTED   Klebsiella aerogenes NOT DETECTED NOT DETECTED   Klebsiella oxytoca NOT DETECTED NOT DETECTED   Klebsiella pneumoniae NOT DETECTED NOT DETECTED   Proteus species NOT DETECTED NOT DETECTED   Salmonella species NOT DETECTED NOT DETECTED   Serratia marcescens NOT DETECTED NOT DETECTED   Haemophilus influenzae NOT  DETECTED NOT DETECTED   Neisseria meningitidis NOT DETECTED NOT DETECTED   Pseudomonas aeruginosa NOT DETECTED NOT DETECTED   Stenotrophomonas maltophilia NOT DETECTED NOT DETECTED   Candida albicans NOT DETECTED NOT DETECTED   Candida auris NOT DETECTED NOT DETECTED   Candida glabrata NOT DETECTED NOT DETECTED   Candida krusei NOT DETECTED NOT DETECTED   Candida parapsilosis NOT DETECTED NOT DETECTED   Candida tropicalis NOT DETECTED NOT DETECTED   Cryptococcus neoformans/gattii NOT DETECTED NOT DETECTED    Loralee Pacas, PharmD, BCPS Pharmacy: [phone] 03/13/2022  4:23 PM

## 2022-03-13 NOTE — Assessment & Plan Note (Signed)
Could be secondary to sepsis.  AST and ALT mildly elevated.  Follow repeat CMP in the morning.

## 2022-03-13 NOTE — Assessment & Plan Note (Addendum)
-  Presented with tachycardia, tachypnea with leukocytosis and grossly positive UA -Continue IV Rocephin pending urine culture -Continuous IV fluid resuscitation overnight -Later started to have urinary retention-will put foley catheter in

## 2022-03-13 NOTE — Assessment & Plan Note (Addendum)
-  Patient currently alert and oriented only to self.  Unclear baseline but likely altered due to urosepsis. -continue Subutex and Norco to avoid withdrawal -continue PRN Clonazepam -will lower dose of Gabapentin for now from 800mg  TID to 400mg  TID  -hold home Tizanidine -Continue treatment for UTI as above

## 2022-03-13 NOTE — Progress Notes (Addendum)
PROGRESS NOTE    Stephanie Carrillo  GMW:[id number] DOB: Jul 14, 1956 DOA: 03/12/2022 PCP: Lavada Mesi, MD   Brief Narrative: 66 year old with past medical history significant for hypertension, chronic pain syndrome who presented with concern of altered mental status.  Patient was feeling unwell with generalized malaise.  Her family was not able to get on hold of  her and wellness check was called.  Patient was found outside of her apartment with low blood pressure and  cyanotic lips/.   She was initially hypotensive with systolic blood pressure 78/34 pulse 110.  Subsequently blood pressure increased to 150.  Patient received IV fluids.  Patient has been admitted with sepsis secondary to UTI, acute metabolic encephalopathy.    Assessment & Plan:   Principal Problem:   Sepsis secondary to UTI [facility]) Active Problems:   Acute metabolic encephalopathy   AKI (acute kidney injury) (HCC)   Metabolic acidosis   Elevated LFTs  1-Sepsis secondary to UTI: Patient presented with tachycardia, tachypnea, leukocytosis, UA with more than 50 white blood cell. He received IV fluids. He subsequently developed urinary retention, Foley catheter was placed 8/9. Continue with IV ceftriaxone. Blood cultures: No growth today. Follow Urine  culture.  Acute metabolic encephalopathy: She was found down outside her apartment.  She was initially confused on admission.  Improving. Probably  related to infection. Continue with buprenorphine  and Norco to avoid withdrawal symptoms. On as needed clonazepam. Gabapentin dose has been reduced  Transaminases: Secondary to hypotension and or sepsis.  Resolved.   Metabolic acidosis, lactic acidosis in the setting of sepsis, continue with IV fluids Lactic acid trending down.   Hypokalemia; Replete IV and orally.   AKI: Present with a creatinine of 1.3. Improved with IV fluids Cr down to 0.7  Hyperlipidemia: Continue with Crestor   Estimated body mass index  is 30.36 kg/m as calculated from the following:   Height as of 02/05/22: 5\' 4"  (1.626 m).   Weight as of 10/08/21: 80.2 kg.   DVT prophylaxis: Lovenox Code Status: Full code Family Communication: Care discussed with patient Disposition Plan:  Status is: Inpatient Remains inpatient appropriate because: Remain in the hospital for management of sepsis and UTI.    Consultants:  None  Procedures:  none  Antimicrobials:    Subjective: She is alert, conversant. She is started to remember events She denies pain.   Objective: Vitals:   03/13/22 0300 03/13/22 0330 03/13/22 0345 03/13/22 0721  BP: (!) 152/88 (!) 150/87 (!) 155/88 (!) 148/86  Pulse: 93 98 93 80  Resp: 20 20 16 19   Temp: 98 F (36.7 C) 98.2 F (36.8 C)  98 F (36.7 C)  TempSrc:    Oral  SpO2: 100% 98% 99% 95%    Intake/Output Summary (Last 24 hours) at 03/13/2022 0730 Last data filed at 03/13/2022 0127 Gross per 24 hour  Intake 3100 ml  Output --  Net 3100 ml   There were no vitals filed for this visit.  Examination:  General exam: Appears calm and comfortable  Respiratory system: Clear to auscultation. Respiratory effort normal. Cardiovascular system: S1 & S2 heard, RRR. No JVD, murmurs, rubs, gallops or clicks. No pedal edema. Gastrointestinal system: Abdomen is nondistended, soft and nontender. No organomegaly or masses felt. Normal bowel sounds heard. Central nervous system: Alert and oriented. No focal neurological deficits. Extremities: Symmetric 5 x 5 power.    Data Reviewed: I have personally reviewed following labs and imaging studies  CBC: Recent Labs  Lab 03/12/22 1827 03/13/22  0402  WBC 20.2* 16.9*  NEUTROABS 15.3*  --   HGB 17.0* 13.4  HCT 49.0* 39.6  MCV 97.4 101.3*  PLT 370 [id number]   Basic Metabolic Panel: Recent Labs  Lab 03/12/22 1940 03/13/22 0402  NA 135 135  K 3.9 2.9*  CL 99 104  CO2 19* 20*  GLUCOSE 98 85  BUN 33* 23  CREATININE 1.35* 0.79  CALCIUM 9.8 8.5*    GFR: CrCl cannot be calculated (Unknown ideal weight.). Liver Function Tests: Recent Labs  Lab 03/12/22 1940 03/13/22 0402  AST 58* 38  ALT 53* 39  ALKPHOS 126 93  BILITOT 1.4* 1.0  PROT 9.0* 6.8  ALBUMIN 4.7 3.5   No results for input(s): "LIPASE", "AMYLASE" in the last 168 hours. No results for input(s): "AMMONIA" in the last 168 hours. Coagulation Profile: No results for input(s): "INR", "PROTIME" in the last 168 hours. Cardiac Enzymes: No results for input(s): "CKTOTAL", "CKMB", "CKMBINDEX", "TROPONINI" in the last 168 hours. BNP (last 3 results) No results for input(s): "PROBNP" in the last 8760 hours. HbA1C: No results for input(s): "HGBA1C" in the last 72 hours. CBG: Recent Labs  Lab 03/12/22 1835  GLUCAP 124*   Lipid Profile: No results for input(s): "CHOL", "HDL", "LDLCALC", "TRIG", "CHOLHDL", "LDLDIRECT" in the last 72 hours. Thyroid Function Tests: No results for input(s): "TSH", "T4TOTAL", "FREET4", "T3FREE", "THYROIDAB" in the last 72 hours. Anemia Panel: No results for input(s): "VITAMINB12", "FOLATE", "FERRITIN", "TIBC", "IRON", "RETICCTPCT" in the last 72 hours. Sepsis Labs: Recent Labs  Lab 03/12/22 1940 03/12/22 2345  LATICACIDVEN 3.5* 1.0    Recent Results (from the past 240 hour(s))  SARS Coronavirus 2 by RT PCR (hospital order, performed in [facility] hospital lab) *cepheid single result test* Anterior Nasal Swab     Status: None   Collection Time: 03/12/22  7:53 PM   Specimen: Anterior Nasal Swab  Result Value Ref Range Status   SARS Coronavirus 2 by RT PCR NEGATIVE NEGATIVE Final    Comment: (NOTE) SARS-CoV-2 target nucleic acids are NOT DETECTED.  The SARS-CoV-2 RNA is generally detectable in upper and lower respiratory specimens during the acute phase of infection. The lowest concentration of SARS-CoV-2 viral copies this assay can detect is 250 copies / mL. A negative result does not preclude SARS-CoV-2 infection and should not be  used as the sole basis for treatment or other patient management decisions.  A negative result may occur with improper specimen collection / handling, submission of specimen other than nasopharyngeal swab, presence of viral mutation(s) within the areas targeted by this assay, and inadequate number of viral copies (<250 copies / mL). A negative result must be combined with clinical observations, patient history, and epidemiological information.  Fact Sheet for Patients:   https://www.patel.info/  Fact Sheet for Healthcare Providers: https://hall.com/  This test is not yet approved or  cleared by the [place] FDA and has been authorized for detection and/or diagnosis of SARS-CoV-2 by FDA under an Emergency Use Authorization (EUA).  This EUA will remain in effect (meaning this test can be used) for the duration of the COVID-19 declaration under Section 564(b)(1) of the Act, 21 U.S.C. section 360bbb-3(b)(1), unless the authorization is terminated or revoked sooner.  Performed at [facility], [place] [address] [place], [place] [address]          Radiology Studies: CT Head Wo Contrast  Result Date: 03/12/2022 CLINICAL DATA:  Altered mental status EXAM: CT HEAD WITHOUT CONTRAST CT CERVICAL SPINE WITHOUT  CONTRAST TECHNIQUE: Multidetector CT imaging of the head and cervical spine was performed following the standard protocol without intravenous contrast. Multiplanar CT image reconstructions of the cervical spine were also generated. RADIATION DOSE REDUCTION: This exam was performed according to the departmental dose-optimization program which includes automated exposure control, adjustment of the mA and/or kV according to patient size and/or use of iterative reconstruction technique. COMPARISON:  03/29/2021 CT head, no prior CT cervical spine FINDINGS: CT HEAD FINDINGS Brain: No evidence of acute infarct, hemorrhage, mass, mass  effect, or midline shift. No hydrocephalus or extra-axial fluid collection. Vascular: No hyperdense vessel. Skull: Hyperostosis frontalis. Negative for fracture or focal lesion. Sinuses/Orbits: No acute finding. Other: The mastoid air cells are well aerated. CT CERVICAL SPINE FINDINGS Evaluation is somewhat limited by motion artifact. Alignment: Straightening of the normal cervical lordosis. Skull base and vertebrae: ACDF C6-C7. Evidence of osseous fusion across C5-T1, although evaluation is limited by motion. No definite acute fracture. No suspicious osseous lesion. Soft tissues and spinal canal: No prevertebral fluid or swelling. No visible canal hematoma. Disc levels: Multilevel degenerative changes, with ossification along the posterior aspect of C6, C7, and T1 causing severe spinal canal stenosis. Upper chest: No focal pulmonary opacity in the imaged lungs. Other: None. IMPRESSION: 1.  No acute intracranial process. 2. Evaluation of the cervical spine is limited by motion. Within this limitation, no acute osseous abnormality. 3. Ossification along the posterior aspect C6, C7, and T1 causes severe spinal canal stenosis. Electronically Signed   By: Merilyn Baba M.D.   On: 03/12/2022 19:17   CT Cervical Spine Wo Contrast  Result Date: 03/12/2022 CLINICAL DATA:  Altered mental status EXAM: CT HEAD WITHOUT CONTRAST CT CERVICAL SPINE WITHOUT CONTRAST TECHNIQUE: Multidetector CT imaging of the head and cervical spine was performed following the standard protocol without intravenous contrast. Multiplanar CT image reconstructions of the cervical spine were also generated. RADIATION DOSE REDUCTION: This exam was performed according to the departmental dose-optimization program which includes automated exposure control, adjustment of the mA and/or kV according to patient size and/or use of iterative reconstruction technique. COMPARISON:  03/29/2021 CT head, no prior CT cervical spine FINDINGS: CT HEAD FINDINGS Brain:  No evidence of acute infarct, hemorrhage, mass, mass effect, or midline shift. No hydrocephalus or extra-axial fluid collection. Vascular: No hyperdense vessel. Skull: Hyperostosis frontalis. Negative for fracture or focal lesion. Sinuses/Orbits: No acute finding. Other: The mastoid air cells are well aerated. CT CERVICAL SPINE FINDINGS Evaluation is somewhat limited by motion artifact. Alignment: Straightening of the normal cervical lordosis. Skull base and vertebrae: ACDF C6-C7. Evidence of osseous fusion across C5-T1, although evaluation is limited by motion. No definite acute fracture. No suspicious osseous lesion. Soft tissues and spinal canal: No prevertebral fluid or swelling. No visible canal hematoma. Disc levels: Multilevel degenerative changes, with ossification along the posterior aspect of C6, C7, and T1 causing severe spinal canal stenosis. Upper chest: No focal pulmonary opacity in the imaged lungs. Other: None. IMPRESSION: 1.  No acute intracranial process. 2. Evaluation of the cervical spine is limited by motion. Within this limitation, no acute osseous abnormality. 3. Ossification along the posterior aspect C6, C7, and T1 causes severe spinal canal stenosis. Electronically Signed   By: Merilyn Baba M.D.   On: 03/12/2022 19:17   DG Chest Portable 1 View  Result Date: 03/12/2022 CLINICAL DATA:  Altered mental status EXAM: PORTABLE CHEST 1 VIEW COMPARISON:  03/28/2021 FINDINGS: Heart and mediastinal contours are within normal limits. No focal opacities or  effusions. No acute bony abnormality. IMPRESSION: No active disease. Electronically Signed   By: Charlett Nose M.D.   On: 03/12/2022 19:05        Scheduled Meds:  buprenorphine  2 mg Sublingual BID   DULoxetine  60 mg Oral Daily   enoxaparin (LOVENOX) injection  40 mg Subcutaneous Q24H   gabapentin  400 mg Oral TID   linaclotide  72 mcg Oral Daily   metoprolol tartrate  100 mg Oral BID   rosuvastatin  10 mg Oral QHS   Continuous  Infusions:  cefTRIAXone (ROCEPHIN)  IV Stopped (03/13/22 0127)   lactated ringers 150 mL/hr at 03/13/22 0126     LOS: 1 day    Time spent: 35 minutes.     Alba Cory, MD [facility]   If 7PM-7AM, please contact night-coverage [email or web address]  03/13/2022, 7:30 AM

## 2022-03-13 NOTE — Assessment & Plan Note (Signed)
Creatinine of 1.35 from 0.52. Continue IV fluid.  -avoid nephrotoxic agent

## 2022-03-13 NOTE — ED Notes (Signed)
Bladder scan performed with . MD aware

## 2022-03-13 NOTE — H&P (Addendum)
History and Physical    Patient: Stephanie Carrillo ALP:[id number] DOB: [birth year] DOA: 03/12/2022 DOS: the patient was seen and examined on 03/13/2022 PCP: Lavada Mesi, MD  Patient coming from: Home  Chief Complaint:  Chief Complaint  Patient presents with   Altered Mental Status   HPI: Stephanie Carrillo is a 66 y.o. female with medical history significant of hypertension, chronic pain syndrome who presents with concerns of altered mental status.  Patient reports feeling unwell with generalized malaise today. Believes she had a fever.  Thinks she might of called her brother who lives out of state and they called EMS.  She reports going outside to wait for the ambulance although reportedly they found her laying on the ground outside, unresponsive, cyanotic and hypotensive. Denies any nausea, vomiting, abdominal pain or diarrhea.  No coughing, shortness of breath or chest pain.  In the ED, she was afebrile tachycardic and tachypneic and intermittently hypertensive up to SBP of 160s on room air. Has leukocytosis of 20.2 K, AKI with creatinine of 1.35 with anion gap of 17.  Lactate of 3.5. Mild elevated AST and ALT of 58 and 53 respectively.  Total bilirubin 1.4. UA with moderate leukocyte, negative nitrate many bacteria.  UDS positive for benzo and opioids which are prescribed to her. She was fluid resuscitated with 3 L of IV normal saline bolus and started on IV Rocephin.  Hospitalist called for mission.  Review of Systems: As mentioned in the history of present illness. All other systems reviewed and are negative. Past Medical History:  Diagnosis Date   Anxiety    Arthritis    Chronic kidney disease    Depression    GERD (gastroesophageal reflux disease)    Heart murmur    Hypertension    Insomnia    Meningitis    Pneumonia    Recurrent UTI    Seizures (HCC)    Substance abuse [facility])    Past Surgical History:  Procedure Laterality Date   BACK SURGERY     CERVICAL DISC SURGERY      FRACTURE SURGERY N/A    [employer]rees[employer] 05/07/2020   SPINE SURGERY N/A    [employer] 05/07/2020   Social History:  reports that she has quit smoking. Her smoking use included cigarettes. She has a 30.00 pack-year smoking history. She has never used smokeless tobacco. She reports that she does not currently use alcohol. She reports that she does not currently use drugs.  Allergies  Allergen Reactions   Ciprofloxacin Shortness Of Breath    Respiratory arrest   Penicillins Shortness Of Breath and Rash    Did it involve swelling of the face/tongue/throat, SOB, or low BP? y Did it involve sudden or severe rash/hives, skin peeling, or any reaction on the inside of your mouth or nose? y Did you need to seek medical attention at a hospital or doctor's office? n When did it last happen?  1985 If all above answers are "NO", may proceed with cephalosporin use.  Tolerated Ceftriaxone 10/05/20 - 10/07/20   Oxycodone Other (See Comments)    Patient in recovery process.    Family History  Problem Relation Age of Onset   Multiple sclerosis Mother    Cancer Mother        Possibly breast or colon mets to brain   Cancer Father    Lung cancer Father    Heart disease Father    Cancer Brother    Testicular cancer Brother    Prostate cancer Brother  Skin cancer Brother    Colon polyps Brother    Colon polyps Brother     Prior to Admission medications   Medication Sig Start Date End Date Taking? Authorizing Provider  acetaminophen (TYLENOL) 500 MG tablet Take 2 tablets (1,000 mg total) by mouth every 6 (six) hours as needed. 04/05/21  Yes Meredeth Ide, MD  buprenorphine (SUBUTEX) 2 MG SUBL SL tablet Place 2 mg under the tongue 2 (two) times daily. 07/02/21  Yes [provider]  clonazePAM (KLONOPIN) 0.5 MG tablet Take 1 tablet (0.5 mg total) by mouth 3 (three) times daily as needed for anxiety. 04/15/21  Yes Eldred Manges, MD  diclofenac Sodium (VOLTAREN) 1 % GEL Apply 2 g topically daily as  needed (pain).   Yes [provider]  diphenhydrAMINE (BENADRYL) 50 MG tablet Take 150 mg by mouth at bedtime as needed.   Yes [provider]  DULoxetine (CYMBALTA) 60 MG capsule Take 1 capsule (60 mg total) by mouth daily. 03/26/21  Yes Hilts, Casimiro Needle, MD  gabapentin (NEURONTIN) 800 MG tablet Take 800 mg by mouth 3 (three) times daily.   Yes [provider]  HYDROcodone-acetaminophen (NORCO) 10-325 MG tablet Take 1 tablet by mouth in the morning, at noon, in the evening, and at bedtime. 06/04/21  Yes [provider]  LASIX 40 MG tablet Take 1 tablet by mouth daily as needed for fluid. 11/25/21  Yes [provider]  LINZESS 72 MCG capsule Take 72 mcg by mouth daily. 09/22/21  Yes [provider]  lisinopril (ZESTRIL) 10 MG tablet Take 10 mg by mouth daily. 09/10/21  Yes [provider]  metoprolol tartrate (LOPRESSOR) 100 MG tablet Take 1 tablet (100 mg total) by mouth 2 (two) times daily. 04/05/21  Yes Meredeth Ide, MD  Multiple Vitamins-Minerals (CENTRUM SILVER 50+WOMEN) TABS Take 1 tablet by mouth daily.   Yes [provider]  NORVASC 10 MG tablet Take 10 mg by mouth daily. 09/10/21  Yes [provider]  rosuvastatin (CRESTOR) 10 MG tablet Take 10 mg by mouth at bedtime. 07/02/21  Yes [provider]  tiZANidine (ZANAFLEX) 4 MG tablet Take 4 mg by mouth 3 (three) times daily. 05/18/21  Yes [provider]  trimethoprim (TRIMPEX) 100 MG tablet Take 100 mg by mouth daily. 06/17/21  Yes [provider]  pantoprazole (PROTONIX) 40 MG tablet Take 1 tablet (40 mg total) by mouth daily at 6 (six) AM. Patient not taking: Reported on 03/12/2022 10/08/20   Rodolph Bong, MD    Physical Exam: Vitals:   03/12/22 2230 03/12/22 2245 03/12/22 2328 03/13/22 0015  BP: (!) 159/92 (!) 154/88  (!) 161/85  Pulse: (!) 108 (!) 110  (!) 109  Resp: 15 17  18   Temp:   97.8 F (36.6 C)   TempSrc:   Oral   SpO2:  97% 99%  99%   Constitutional: NAD, calm, comfortable and nontoxic appearing elderly female laying flat in bed Eyes:  lids and conjunctivae normal ENMT: Mucous membranes are moist. Neck: normal, supple Respiratory: clear to auscultation bilaterally, no wheezing, no crackles. Normal respiratory effort. No accessory muscle use.  Cardiovascular: Regular rate and rhythm, no murmurs / rubs / gallops. No extremity edema.  Abdomen: Soft, nontender nondistended.  Bowel sounds positive.  Musculoskeletal: no clubbing / cyanosis. No joint deformity upper and lower extremities. Good ROM, no contractures. Normal muscle tone.  Skin: no rashes, lesions, ulcers. No induration Neurologic: CN 2-12 grossly intact. Strength  5/5 in all 4.  Alert and oriented only to self but able to have normal conversation. Psychiatric:  Normal mood. Data Reviewed:  See HPI  Assessment and Plan: * Sepsis secondary to UTI (HCC) -Presented with tachycardia, tachypnea with leukocytosis and grossly positive UA -Continue IV Rocephin pending urine culture -Continuous IV fluid resuscitation overnight -Later started to have urinary retention-will put foley catheter in  Elevated LFTs Could be secondary to sepsis.  AST and ALT mildly elevated.  Follow repeat CMP in the morning.  Metabolic acidosis Secondary to sepsis.  Continuous IV fluid overnight.  AKI (acute kidney injury) (HCC) Creatinine of 1.35 from 0.52. Continue IV fluid.  -avoid nephrotoxic agent  Acute metabolic encephalopathy -Patient currently alert and oriented only to self.  Unclear baseline but likely altered due to urosepsis. -continue Subutex and Norco to avoid withdrawal -continue PRN Clonazepam -will lower dose of Gabapentin for now from 800mg  TID to 400mg  TID  -hold home Tizanidine -Continue treatment for UTI as above      Advance Care Planning:   Code Status: Full Code   Consults: none  Family Communication: none at bedside  Severity of  Illness: The appropriate patient status for this patient is INPATIENT. Inpatient status is judged to be reasonable and necessary in order to provide the required intensity of service to ensure the patient's safety. The patient's presenting symptoms, physical exam findings, and initial radiographic and laboratory data in the context of their chronic comorbidities is felt to place them at high risk for further clinical deterioration. Furthermore, it is not anticipated that the patient will be medically stable for discharge from the hospital within 2 midnights of admission.   * I certify that at the point of admission it is my clinical judgment that the patient will require inpatient hospital care spanning beyond 2 midnights from the point of admission due to high intensity of service, high risk for further deterioration and high frequency of surveillance required.*  Author: , DO 03/13/2022 1:21 AM  For on call review [email or web address].

## 2022-03-14 DIAGNOSIS — N390 Urinary tract infection, site not specified: Secondary | ICD-10-CM | POA: Diagnosis not present

## 2022-03-14 DIAGNOSIS — A419 Sepsis, unspecified organism: Secondary | ICD-10-CM | POA: Diagnosis not present

## 2022-03-14 LAB — CBC
HCT: 39.9 % (ref 36.0–46.0)
Hemoglobin: 13.6 g/dL (ref 12.0–15.0)
MCH: 33.8 pg (ref 26.0–34.0)
MCHC: 34.1 g/dL (ref 30.0–36.0)
MCV: 99.3 fL (ref 80.0–100.0)
Platelets: 189 10*3/uL (ref 150–400)
RBC: 4.02 MIL/uL (ref 3.87–5.11)
RDW: 12.8 % (ref 11.5–15.5)
WBC: 13.1 10*3/uL — ABNORMAL HIGH (ref 4.0–10.5)
nRBC: 0 % (ref 0.0–0.2)

## 2022-03-14 LAB — BASIC METABOLIC PANEL
Anion gap: 10 (ref 5–15)
BUN: 12 mg/dL (ref 8–23)
CO2: 26 mmol/L (ref 22–32)
Calcium: 8.7 mg/dL — ABNORMAL LOW (ref 8.9–10.3)
Chloride: 99 mmol/L (ref 98–111)
Creatinine, Ser: 0.67 mg/dL (ref 0.44–1.00)
GFR, Estimated: >60 mL/min (ref >60)
Glucose, Bld: 90 mg/dL (ref 70–99)
Potassium: 3.7 mmol/L (ref 3.5–5.1)
Sodium: 135 mmol/L (ref 135–145)

## 2022-03-14 LAB — MAGNESIUM: Magnesium: 1.4 mg/dL — ABNORMAL LOW (ref 1.7–2.4)

## 2022-03-14 MED ORDER — LACTATED RINGERS IV SOLN: INTRAVENOUS | Status: DC

## 2022-03-14 MED ORDER — MAGNESIUM SULFATE 2 GM/50ML IV SOLN
2.0000 g | Freq: Once | INTRAVENOUS | Status: AC
  Administered 2022-03-14: 2 g via INTRAVENOUS
  Filled 2022-03-14: qty 50

## 2022-03-14 NOTE — Plan of Care (Signed)

## 2022-03-14 NOTE — Progress Notes (Signed)
PROGRESS NOTE    Stephanie BackersLisa Carrillo  ZOX:[id number] DOB: [birth year] DOA: 03/12/2022 PCP: Lavada MesiHilts, Michael, MD   Brief Narrative: 66 year old with past medical history significant for hypertension, chronic pain syndrome who presented with concern of altered mental status.  Patient was feeling unwell with generalized malaise.  Her family was not able to get on hold of  her and wellness check was called.  Patient was found outside of her apartment with low blood pressure and  cyanotic lips/.   She was initially hypotensive with systolic blood pressure 78/34 pulse 110.  Subsequently blood pressure increased to 150.  Patient received IV fluids.  Patient has been admitted with sepsis secondary to UTI, acute metabolic encephalopathy.    Assessment & Plan:   Principal Problem:   Sepsis secondary to UTI [facility](HCC) Active Problems:   Acute metabolic encephalopathy   AKI (acute kidney injury) (HCC)   Metabolic acidosis   Elevated LFTs  1-Sepsis secondary to UTI: Patient presented with tachycardia, tachypnea, leukocytosis, UA with more than 50 white blood cell. She received IV fluids. She subsequently developed urinary retention, Foley catheter was placed 8/9. Continue with IV ceftriaxone. Blood cultures: 1 out of 4 bottle grew staph hominis. Likely contaminate.  Follow Urine  culture. Pending.  White blood cell trending down, tachycardia improved. Plan to keep 1 more day on IV antibiotics  Acute metabolic encephalopathy: She was found down outside her apartment.  She was initially confused on admission.  Improving. Probably  related to infection. Continue with buprenorphine  and Norco to avoid withdrawal symptoms. On as needed clonazepam. Gabapentin dose has been reduced Back to baseline.  Resolved.   1 out of 4 blood cultures positive for staph hominis Likely a contaminant Plan to repeat blood cultures  Transaminases: Secondary to hypotension and or sepsis.  Resolved.   Metabolic acidosis,  lactic acidosis in the setting of sepsis, continue with IV fluids Lactic acid trending down.   Hypokalemia; Replaced.   AKI: Present with a creatinine of 1.3. Improved with IV fluids Cr down to 0.7  Hypomagnesemia;  Replete IV.   Hyperlipidemia: Continue with Crestor   Estimated body mass index is 23.69 kg/m as calculated from the following:   Height as of this encounter: 5\' 4"  (1.626 m).   Weight as of this encounter: 62.6 kg.   DVT prophylaxis: Lovenox Code Status: Full code Family Communication: Care discussed with patient Disposition Plan:  Status is: Inpatient Remains inpatient appropriate because: Remain in the hospital for management of sepsis and UTI.    Consultants:  None  Procedures:  none  Antimicrobials:    Subjective: She is feeling better, she is asking when she can go home.  She denies any pain.  Plan to proceed with voiding trial today.  Objective: Vitals:   03/13/22 2251 03/14/22 0422 03/14/22 0707 03/14/22 1235  BP: 133/83 112/73 118/74 132/88  Pulse: 92 84 88 88  Resp: 16 18 18 20   Temp: 98 F (36.7 C) (!) 97.5 F (36.4 C) 98.1 F (36.7 C)   TempSrc: Oral Oral Oral   SpO2: 95% 95% 93% 94%  Weight:      Height:        Intake/Output Summary (Last 24 hours) at 03/14/2022 1355 Last data filed at 03/14/2022 1243 Gross per 24 hour  Intake 1846.81 ml  Output 3100 ml  Net -1253.19 ml    Filed Weights   03/13/22 0816  Weight: 62.6 kg    Examination:  General exam: NAD Respiratory system: CTA  Cardiovascular system: S 1, S 2 RRR Gastrointestinal system: BS present, soft, nt Central nervous system: non focal.  Extremities: no edema    Data Reviewed: I have personally reviewed following labs and imaging studies  CBC: Recent Labs  Lab 03/12/22 1827 03/13/22 0402 03/14/22 0730  WBC 20.2* 16.9* 13.1*  NEUTROABS 15.3*  --   --   HGB 17.0* 13.4 13.6  HCT 49.0* 39.6 39.9  MCV 97.4 101.3* 99.3  PLT 370 222 189    Basic  Metabolic Panel: Recent Labs  Lab 03/12/22 1940 03/13/22 0402 03/14/22 0730  NA 135 135 135  K 3.9 2.9* 3.7  CL 99 104 99  CO2 19* 20* 26  GLUCOSE 98 85 90  BUN 33* 23 12  CREATININE 1.35* 0.79 0.67  CALCIUM 9.8 8.5* 8.7*  MG  --   --  1.4*    GFR: Estimated Creatinine Clearance: 59.7 mL/min (by C-G formula based on SCr of 0.67 mg/dL). Liver Function Tests: Recent Labs  Lab 03/12/22 1940 03/13/22 0402  AST 58* 38  ALT 53* 39  ALKPHOS 126 93  BILITOT 1.4* 1.0  PROT 9.0* 6.8  ALBUMIN 4.7 3.5    No results for input(s): "LIPASE", "AMYLASE" in the last 168 hours. No results for input(s): "AMMONIA" in the last 168 hours. Coagulation Profile: No results for input(s): "INR", "PROTIME" in the last 168 hours. Cardiac Enzymes: No results for input(s): "CKTOTAL", "CKMB", "CKMBINDEX", "TROPONINI" in the last 168 hours. BNP (last 3 results) No results for input(s): "PROBNP" in the last 8760 hours. HbA1C: No results for input(s): "HGBA1C" in the last 72 hours. CBG: Recent Labs  Lab 03/12/22 1835  GLUCAP 124*    Lipid Profile: No results for input(s): "CHOL", "HDL", "LDLCALC", "TRIG", "CHOLHDL", "LDLDIRECT" in the last 72 hours. Thyroid Function Tests: No results for input(s): "TSH", "T4TOTAL", "FREET4", "T3FREE", "THYROIDAB" in the last 72 hours. Anemia Panel: No results for input(s): "VITAMINB12", "FOLATE", "FERRITIN", "TIBC", "IRON", "RETICCTPCT" in the last 72 hours. Sepsis Labs: Recent Labs  Lab 03/12/22 1940 03/12/22 2345  LATICACIDVEN 3.5* 1.0     Recent Results (from the past 240 hour(s))  Culture, blood (routine x 2)     Status: Abnormal (Preliminary result)   Collection Time: 03/12/22  7:44 PM   Specimen: BLOOD  Result Value Ref Range Status   Specimen Description   Final    BLOOD THUMB Performed at [facility], [address], [place], [place] [address]    Special Requests   Final    BOTTLES DRAWN AEROBIC AND ANAEROBIC Blood  Culture results may not be optimal due to an inadequate volume of blood received in culture bottles Performed at [facility], [address] [place], [place] [address]    Culture  Setup Time   Final    GRAM POSITIVE COCCI ANAEROBIC BOTTLE ONLY CRITICAL RESULT CALLED TO, READ BACK BY AND VERIFIED WITH: PHARMD ERIN WILLIAMSON ON 03/13/22 @ 1618 BY DRT     Culture (A)  Final    STAPHYLOCOCCUS HOMINIS THE SIGNIFICANCE OF ISOLATING THIS ORGANISM FROM A SINGLE SET OF BLOOD CULTURES WHEN MULTIPLE SETS ARE DRAWN IS UNCERTAIN. PLEASE NOTIFY THE MICROBIOLOGY DEPARTMENT WITHIN ONE WEEK IF SPECIATION AND SENSITIVITIES ARE REQUIRED. Performed at [facility], [address], [place], [place] [address]    Report Status PENDING  Incomplete  Blood Culture ID Panel (Reflexed)     Status: Abnormal   Collection Time: 03/12/22  7:44 PM  Result  Value Ref Range Status   Enterococcus faecalis NOT DETECTED NOT DETECTED Final   Enterococcus Faecium NOT DETECTED NOT DETECTED Final   Listeria monocytogenes NOT DETECTED NOT DETECTED Final   Staphylococcus species DETECTED (A) NOT DETECTED Final    Comment: CRITICAL RESULT CALLED TO, READ BACK BY AND VERIFIED WITH: PHARMD ERIN WILLIAMSON ON 03/13/22 @ 1618 BY DRT     Staphylococcus aureus (BCID) NOT DETECTED NOT DETECTED Final   Staphylococcus epidermidis NOT DETECTED NOT DETECTED Final   Staphylococcus lugdunensis NOT DETECTED NOT DETECTED Final   Streptococcus species NOT DETECTED NOT DETECTED Final   Streptococcus agalactiae NOT DETECTED NOT DETECTED Final   Streptococcus pneumoniae NOT DETECTED NOT DETECTED Final   Streptococcus pyogenes NOT DETECTED NOT DETECTED Final   A.calcoaceticus-baumannii NOT DETECTED NOT DETECTED Final   Bacteroides fragilis NOT DETECTED NOT DETECTED Final   Enterobacterales NOT DETECTED NOT DETECTED Final   Enterobacter cloacae complex NOT DETECTED NOT DETECTED Final   Escherichia coli NOT DETECTED NOT  DETECTED Final   Klebsiella aerogenes NOT DETECTED NOT DETECTED Final   Klebsiella oxytoca NOT DETECTED NOT DETECTED Final   Klebsiella pneumoniae NOT DETECTED NOT DETECTED Final   Proteus species NOT DETECTED NOT DETECTED Final   Salmonella species NOT DETECTED NOT DETECTED Final   Serratia marcescens NOT DETECTED NOT DETECTED Final   Haemophilus influenzae NOT DETECTED NOT DETECTED Final   Neisseria meningitidis NOT DETECTED NOT DETECTED Final   Pseudomonas aeruginosa NOT DETECTED NOT DETECTED Final   Stenotrophomonas maltophilia NOT DETECTED NOT DETECTED Final   Candida albicans NOT DETECTED NOT DETECTED Final   Candida auris NOT DETECTED NOT DETECTED Final   Candida glabrata NOT DETECTED NOT DETECTED Final   Candida krusei NOT DETECTED NOT DETECTED Final   Candida parapsilosis NOT DETECTED NOT DETECTED Final   Candida tropicalis NOT DETECTED NOT DETECTED Final   Cryptococcus neoformans/gattii NOT DETECTED NOT DETECTED Final    Comment: Performed at [facility] [place] [facility], [address], [place], [place] [address]  SARS Coronavirus 2 by RT PCR (hospital order, performed in [facility] hospital lab) *cepheid single result test* Anterior Nasal Swab     Status: None   Collection Time: 03/12/22  7:53 PM   Specimen: Anterior Nasal Swab  Result Value Ref Range Status   SARS Coronavirus 2 by RT PCR NEGATIVE NEGATIVE Final    Comment: (NOTE) SARS-CoV-2 target nucleic acids are NOT DETECTED.  The SARS-CoV-2 RNA is generally detectable in upper and lower respiratory specimens during the acute phase of infection. The lowest concentration of SARS-CoV-2 viral copies this assay can detect is 250 copies / mL. A negative result does not preclude SARS-CoV-2 infection and should not be used as the sole basis for treatment or other patient management decisions.  A negative result may occur with improper specimen collection / handling, submission of specimen other than nasopharyngeal swab, presence of  viral mutation(s) within the areas targeted by this assay, and inadequate number of viral copies (<250 copies / mL). A negative result must be combined with clinical observations, patient history, and epidemiological information.  Fact Sheet for Patients:   RoadLapTop.co.za  Fact Sheet for Healthcare Providers: http://kim-miller.com/  This test is not yet approved or  cleared by the [place] FDA and has been authorized for detection and/or diagnosis of SARS-CoV-2 by FDA under an Emergency Use Authorization (EUA).  This EUA will remain in effect (meaning this test can be used) for the duration of the COVID-19 declaration under Section 564(b)(1)  of the Act, 21 U.S.C. section 360bbb-3(b)(1), unless the authorization is terminated or revoked sooner.  Performed at [facility], [address] [place], [place] [address]   Culture, blood (routine x 2)     Status: None (Preliminary result)   Collection Time: 03/12/22 11:12 PM   Specimen: BLOOD  Result Value Ref Range Status   Specimen Description   Final    BLOOD SITE NOT SPECIFIED Performed at [facility], [address], [place], [place] [address]    Special Requests   Final    BOTTLES DRAWN AEROBIC ONLY Blood Culture results may not be optimal due to an inadequate volume of blood received in culture bottles Performed at [facility], [address], [place], [place] [address]    Culture   Final    NO GROWTH 1 DAY Performed at [facility], [address] [place], [place] [address]    Report Status PENDING  Incomplete  Urine Culture     Status: None (Preliminary result)   Collection Time: 03/13/22  7:28 AM   Specimen: Urine, Clean Catch  Result Value Ref Range Status   Specimen Description   Final    URINE, CLEAN CATCH Performed at [facility], [address] [place], [place] [address]    Special Requests    Final    NONE Performed at [facility], [address] [place], [place] [address]    Culture   Final    CULTURE REINCUBATED FOR BETTER GROWTH Performed at [facility], [address] [place], [place] [address]    Report Status PENDING  Incomplete         Radiology Studies: CT Head Wo Contrast  Result Date: 03/12/2022 CLINICAL DATA:  Altered mental status EXAM: CT HEAD WITHOUT CONTRAST CT CERVICAL SPINE WITHOUT CONTRAST TECHNIQUE: Multidetector CT imaging of the head and cervical spine was performed following the standard protocol without intravenous contrast. Multiplanar CT image reconstructions of the cervical spine were also generated. RADIATION DOSE REDUCTION: This exam was performed according to the departmental dose-optimization program which includes automated exposure control, adjustment of the mA and/or kV according to patient size and/or use of iterative reconstruction technique. COMPARISON:  03/29/2021 CT head, no prior CT cervical spine FINDINGS: CT HEAD FINDINGS Brain: No evidence of acute infarct, hemorrhage, mass, mass effect, or midline shift. No hydrocephalus or extra-axial fluid collection. Vascular: No hyperdense vessel. Skull: Hyperostosis frontalis. Negative for fracture or focal lesion. Sinuses/Orbits: No acute finding. Other: The mastoid air cells are well aerated. CT CERVICAL SPINE FINDINGS Evaluation is somewhat limited by motion artifact. Alignment: Straightening of the normal cervical lordosis. Skull base and vertebrae: ACDF C6-C7. Evidence of osseous fusion across C5-T1, although evaluation is limited by motion. No definite acute fracture. No suspicious osseous lesion. Soft tissues and spinal canal: No prevertebral fluid or swelling. No visible canal hematoma. Disc levels: Multilevel degenerative changes, with ossification along the posterior aspect of C6, C7, and T1 causing severe spinal canal stenosis. Upper chest: No focal pulmonary  opacity in the imaged lungs. Other: None. IMPRESSION: 1.  No acute intracranial process. 2. Evaluation of the cervical spine is limited by motion. Within this limitation, no acute osseous abnormality. 3. Ossification along the posterior aspect C6, C7, and T1 causes severe spinal canal stenosis. Electronically Signed   By: Wiliam Ke M.D.   On: 03/12/2022 19:17   CT Cervical Spine Wo Contrast  Result Date: 03/12/2022  CLINICAL DATA:  Altered mental status EXAM: CT HEAD WITHOUT CONTRAST CT CERVICAL SPINE WITHOUT CONTRAST TECHNIQUE: Multidetector CT imaging of the head and cervical spine was performed following the standard protocol without intravenous contrast. Multiplanar CT image reconstructions of the cervical spine were also generated. RADIATION DOSE REDUCTION: This exam was performed according to the departmental dose-optimization program which includes automated exposure control, adjustment of the mA and/or kV according to patient size and/or use of iterative reconstruction technique. COMPARISON:  03/29/2021 CT head, no prior CT cervical spine FINDINGS: CT HEAD FINDINGS Brain: No evidence of acute infarct, hemorrhage, mass, mass effect, or midline shift. No hydrocephalus or extra-axial fluid collection. Vascular: No hyperdense vessel. Skull: Hyperostosis frontalis. Negative for fracture or focal lesion. Sinuses/Orbits: No acute finding. Other: The mastoid air cells are well aerated. CT CERVICAL SPINE FINDINGS Evaluation is somewhat limited by motion artifact. Alignment: Straightening of the normal cervical lordosis. Skull base and vertebrae: ACDF C6-C7. Evidence of osseous fusion across C5-T1, although evaluation is limited by motion. No definite acute fracture. No suspicious osseous lesion. Soft tissues and spinal canal: No prevertebral fluid or swelling. No visible canal hematoma. Disc levels: Multilevel degenerative changes, with ossification along the posterior aspect of C6, C7, and T1 causing severe  spinal canal stenosis. Upper chest: No focal pulmonary opacity in the imaged lungs. Other: None. IMPRESSION: 1.  No acute intracranial process. 2. Evaluation of the cervical spine is limited by motion. Within this limitation, no acute osseous abnormality. 3. Ossification along the posterior aspect C6, C7, and T1 causes severe spinal canal stenosis. Electronically Signed   By: Wiliam Ke M.D.   On: 03/12/2022 19:17   DG Chest Portable 1 View  Result Date: 03/12/2022 CLINICAL DATA:  Altered mental status EXAM: PORTABLE CHEST 1 VIEW COMPARISON:  03/28/2021 FINDINGS: Heart and mediastinal contours are within normal limits. No focal opacities or effusions. No acute bony abnormality. IMPRESSION: No active disease. Electronically Signed   By: Charlett Nose M.D.   On: 03/12/2022 19:05        Scheduled Meds:  buprenorphine  2 mg Sublingual BID   DULoxetine  60 mg Oral Daily   enoxaparin (LOVENOX) injection  40 mg Subcutaneous Q24H   gabapentin  400 mg Oral TID   linaclotide  72 mcg Oral Daily   metoprolol tartrate  100 mg Oral BID   rosuvastatin  10 mg Oral QHS   Continuous Infusions:  cefTRIAXone (ROCEPHIN)  IV Stopped (03/14/22 0010)   lactated ringers 100 mL/hr at 03/13/22 1510   lactated ringers 75 mL/hr at 03/14/22 1003   magnesium sulfate bolus IVPB 2 g (03/14/22 1320)     LOS: 2 days    Time spent: 35 minutes.     Alba Cory, MD [facility]   If 7PM-7AM, please contact night-coverage [email or web address]  03/14/2022, 1:55 PM

## 2022-03-14 NOTE — Evaluation (Signed)
Physical Therapy Evaluation Patient Details Name: Stephanie Carrillo MRN: [id number] DOB: Feb 26, 1956 Today's Date: 03/14/2022  History of Present Illness  66 year old with past medical history significant for hypertension, chronic pain syndrome, seizures , anxiety, depression, meningitis, R RCT non surgical  who presented 03/12/22 with concern of altered mental status,Patient was found outside of her apartment with low blood pressure and  cyanotic lips,admitted with sepsis secondary to UTI, acute metabolic encephalopathy.  Clinical Impression  Patient known from previous admission.  Patient ambulatory, although slow and guarded, without use of AD. Patient  resides alone with support of friend and brother. Patient should progress to return home.  Pt admitted with above diagnosis.  Pt currently with functional limitations due to the deficits listed below (see PT Problem List). Pt will benefit from skilled PT to increase their independence and safety with mobility to allow discharge to the venue listed below.          Recommendations for follow up therapy are one component of a multi-disciplinary discharge planning process, led by the attending physician.  Recommendations may be updated based on patient status, additional functional criteria and insurance authorization.  Follow Up Recommendations No PT follow up      Assistance Recommended at Discharge PRN  Patient can return home with the following  Assistance with cooking/housework;Assist for transportation    Equipment Recommendations None recommended by PT  Recommendations for Other Services       Functional Status Assessment Patient has had a recent decline in their functional status and demonstrates the ability to make significant improvements in function in a reasonable and predictable amount of time.     Precautions / Restrictions Precautions Precautions: Fall Precaution Comments: sz, limited RUE shoulder function      Mobility   Bed Mobility Overal bed mobility: Modified Independent                  Transfers Overall transfer level: Modified independent                      Ambulation/Gait Ambulation/Gait assistance: Supervision Gait Distance (Feet): 100 Feet Assistive device: IV Pole, None Gait Pattern/deviations: Step-through pattern Gait velocity: decr Gait velocity interpretation: <1.31 ft/sec, indicative of household ambulator   General Gait Details: gait is slow , ambulated without support.  Stairs            Wheelchair Mobility    Modified Rankin (Stroke Patients Only)       Balance Overall balance assessment: Mild deficits observed, not formally tested                                           Pertinent Vitals/Pain Pain Assessment Pain Assessment: No/denies pain    Home Living Family/patient expects to be discharged to:: Private residence Living Arrangements: Alone Available Help at Discharge: Friend(s) Type of Home: Apartment Home Access: Level entry       Home Layout: One level Home Equipment: Shower seat Additional Comments: brother brought cane and put shower seat in apt    Prior Function Prior Level of Function : Independent/Modified Independent             Mobility Comments: does not drive, orders groceries or friend picks  them up ADLs Comments: uses seat in shower     Hand Dominance        Extremity/Trunk Assessment  Upper Extremity Assessment Upper Extremity Assessment: RUE deficits/detail RUE Deficits / Details: limited shoulder flexion, arm tends to hang         Cervical / Trunk Assessment Cervical / Trunk Assessment: Normal  Communication      Cognition Arousal/Alertness: Awake/alert Behavior During Therapy: WFL for tasks assessed/performed Overall Cognitive Status: Within Functional Limits for tasks assessed                                          General Comments       Exercises     Assessment/Plan    PT Assessment Patient needs continued PT services  PT Problem List Decreased mobility;Decreased strength;Decreased activity tolerance       PT Treatment Interventions Therapeutic activities;Gait training;Therapeutic exercise;Patient/family education;Functional mobility training    PT Goals (Current goals can be found in the Care Plan section)  Acute Rehab PT Goals Patient Stated Goal: go home PT Goal Formulation: With patient Time For Goal Achievement: 03/28/22 Potential to Achieve Goals: Good    Frequency Min 3X/week     Co-evaluation               AM-PAC PT "6 Clicks" Mobility  Outcome Measure Help needed turning from your back to your side while in a flat bed without using bedrails?: None Help needed moving from lying on your back to sitting on the side of a flat bed without using bedrails?: None Help needed moving to and from a bed to a chair (including a wheelchair)?: None Help needed standing up from a chair using your arms (e.g., wheelchair or bedside chair)?: None Help needed to walk in hospital room?: A Little Help needed climbing 3-5 steps with a railing? : A Little 6 Click Score: 22    End of Session Equipment Utilized During Treatment: Gait belt Activity Tolerance: Patient tolerated treatment well Patient left: in chair;with call bell/phone within reach;with chair alarm set Nurse Communication: Mobility status PT Visit Diagnosis: Unsteadiness on feet (R26.81);Difficulty in walking, not elsewhere classified (R26.2)    Time: 5885-0277 PT Time Calculation (min) (ACUTE ONLY): 22 min   Charges:   PT Evaluation $PT Eval Low Complexity: 1 Low          Blanchard Kelch PT Acute Rehabilitation Services Office [phone] Weekend pager-[phone]   Rada Hay 03/14/2022, 2:14 PM

## 2022-03-15 DIAGNOSIS — A419 Sepsis, unspecified organism: Secondary | ICD-10-CM | POA: Diagnosis not present

## 2022-03-15 DIAGNOSIS — N390 Urinary tract infection, site not specified: Secondary | ICD-10-CM | POA: Diagnosis not present

## 2022-03-15 LAB — BASIC METABOLIC PANEL
Anion gap: 10 (ref 5–15)
BUN: 15 mg/dL (ref 8–23)
CO2: 30 mmol/L (ref 22–32)
Calcium: 8.7 mg/dL — ABNORMAL LOW (ref 8.9–10.3)
Chloride: 98 mmol/L (ref 98–111)
Creatinine, Ser: 0.72 mg/dL (ref 0.44–1.00)
GFR, Estimated: >60 mL/min (ref >60)
Glucose, Bld: 90 mg/dL (ref 70–99)
Potassium: 4 mmol/L (ref 3.5–5.1)
Sodium: 138 mmol/L (ref 135–145)

## 2022-03-15 LAB — CULTURE, BLOOD (ROUTINE X 2)

## 2022-03-15 LAB — URINE CULTURE: Culture: >=100000 — AB

## 2022-03-15 LAB — CBC
HCT: 35.7 % — ABNORMAL LOW (ref 36.0–46.0)
Hemoglobin: 12.6 g/dL (ref 12.0–15.0)
MCH: 34 pg (ref 26.0–34.0)
MCHC: 35.3 g/dL (ref 30.0–36.0)
MCV: 96.2 fL (ref 80.0–100.0)
Platelets: 207 10*3/uL (ref 150–400)
RBC: 3.71 MIL/uL — ABNORMAL LOW (ref 3.87–5.11)
RDW: 13.2 % (ref 11.5–15.5)
WBC: 16 10*3/uL — ABNORMAL HIGH (ref 4.0–10.5)
nRBC: 0 % (ref 0.0–0.2)

## 2022-03-15 LAB — MAGNESIUM: Magnesium: 1.7 mg/dL (ref 1.7–2.4)

## 2022-03-15 MED ORDER — SODIUM CHLORIDE 0.9 % IV SOLN: 2.0000 g | INTRAVENOUS | Status: DC

## 2022-03-15 MED ORDER — CEPHALEXIN 500 MG PO CAPS
500.0000 mg | ORAL_CAPSULE | Freq: Four times a day (QID) | ORAL | 0 refills | Status: AC
Start: 2022-03-15 — End: 2022-03-20

## 2022-03-15 MED ORDER — SODIUM CHLORIDE 0.9 % IV SOLN
2.0000 g | INTRAVENOUS | Status: DC
  Administered 2022-03-15: 2 g via INTRAVENOUS
  Filled 2022-03-15: qty 20

## 2022-03-15 NOTE — Discharge Summary (Signed)
Physician Discharge Summary   Patient: Stephanie Carrillo MRN: [id number] DOB: 21-Sep-1955  Admit date:     03/12/2022  Discharge date: 03/15/22  Discharge Physician: Elmarie Shiley   PCP: Eunice Blase, MD   Recommendations at discharge:   Needs follow up resolution of UTI.  Needs CBC   Discharge Diagnoses: Principal Problem:   Sepsis secondary to UTI [facility]) Active Problems:   Acute metabolic encephalopathy   AKI (acute kidney injury) ([place])   Metabolic acidosis   Elevated LFTs  Resolved Problems:   * No resolved hospital problems. *  Hospital Course: 66 year old with past medical history significant for hypertension, chronic pain syndrome who presented with concern of altered mental status.  Patient was feeling unwell with generalized malaise.  Her family was not able to get on hold of  her and wellness check was called.  Patient was found outside of her apartment with low blood pressure and  cyanotic lips/.    She was initially hypotensive with systolic blood pressure [id number] pulse 110.  Subsequently blood pressure increased to 150.  Patient received IV fluids.   Patient has been admitted with sepsis secondary to UTI, acute metabolic encephalopathy.    Assessment and Plan: 1-Sepsis secondary to UTI: Patient presented with tachycardia, tachypnea, leukocytosis, UA with more than 50 white blood cell. She received IV fluids. She subsequently developed urinary retention, Foley catheter was placed 8/9. Treated  IV ceftriaxone for 3 days in patient. . Blood cultures: 1 out of 4 bottle grew staph hominis. Likely contaminate.  Urine culture grew E coli sensitive to ceftriaxone, resistant to cipro and Bactrim. She will be discharge on Keflex. WBC increase today,. She has remain afebrile. Patient wanted to go home today. She wa not willing to stay today. She received IV antibiotics prior to discharge. She will be discharge on Keflex for 5 days.  Tachycardia improved. Voiding trial  8/11---she has been able to urinate.    Acute metabolic encephalopathy: She was found down outside her apartment.  She was initially confused on admission.  Improving. Probably  related to infection. Continue with buprenorphine  and Norco to avoid withdrawal symptoms. On as needed clonazepam. Gabapentin dose has been reduced Back to baseline.  Resolved.    1 out of 4 blood cultures positive for staph hominis Likely a contaminant Repeated blood cultures 8/11; no growth to date.    Transaminases: Secondary to hypotension and or sepsis.  Resolved.    Metabolic acidosis, lactic acidosis in the setting of sepsis, continue with IV fluids Lactic acid trending down.    Hypokalemia; Replaced.    AKI: Present with a creatinine of 1.3. Improved with IV fluids Cr down to 0.7   Hypomagnesemia;  Replaced.    Hyperlipidemia: Continue with Crestor     Estimated body mass index is 23.69 kg/m as calculated from the following:   Height as of this encounter: 5\' 4"  (1.626 m).   Weight as of this encounter: 62.6 kg.        Consultants: None Procedures performed: None Disposition: Home Diet recommendation:  Discharge Diet Orders (From admission, onward)     Start     Ordered   03/15/22 0000  Diet - low sodium heart healthy        03/15/22 1052           Cardiac diet DISCHARGE MEDICATION: Allergies as of 03/15/2022       Reactions   Ciprofloxacin Shortness Of Breath   Respiratory arrest   Penicillins Shortness  Of Breath, Rash   Did it involve swelling of the face/tongue/throat, SOB, or low BP? y Did it involve sudden or severe rash/hives, skin peeling, or any reaction on the inside of your mouth or nose? y Did you need to seek medical attention at a hospital or doctor's office? n When did it last happen?  1985 If all above answers are "NO", may proceed with cephalosporin use. Tolerated Ceftriaxone 10/05/20 - 10/07/20   Oxycodone Other (See Comments)   Patient in recovery  process.        Medication List     STOP taking these medications    Lasix 40 MG tablet Generic drug: furosemide   lisinopril 10 MG tablet Commonly known as: ZESTRIL   Norvasc 10 MG tablet Generic drug: amLODipine   tiZANidine 4 MG tablet Commonly known as: ZANAFLEX   trimethoprim 100 MG tablet Commonly known as: TRIMPEX       TAKE these medications    acetaminophen 500 MG tablet Commonly known as: TYLENOL Take 2 tablets (1,000 mg total) by mouth every 6 (six) hours as needed.   buprenorphine 2 MG Subl SL tablet Commonly known as: SUBUTEX Place 2 mg under the tongue 2 (two) times daily.   Centrum Silver 50+Women Tabs Take 1 tablet by mouth daily.   cephALEXin 500 MG capsule Commonly known as: KEFLEX Take 1 capsule (500 mg total) by mouth 4 (four) times daily for 5 days.   clonazePAM 0.5 MG tablet Commonly known as: KLONOPIN Take 1 tablet (0.5 mg total) by mouth 3 (three) times daily as needed for anxiety.   diclofenac Sodium 1 % Gel Commonly known as: VOLTAREN Apply 2 g topically daily as needed (pain).   diphenhydrAMINE 50 MG tablet Commonly known as: BENADRYL Take 150 mg by mouth at bedtime as needed.   DULoxetine 60 MG capsule Commonly known as: Cymbalta Take 1 capsule (60 mg total) by mouth daily.   gabapentin 800 MG tablet Commonly known as: NEURONTIN Take 800 mg by mouth 3 (three) times daily.   HYDROcodone-acetaminophen 10-325 MG tablet Commonly known as: NORCO Take 1 tablet by mouth in the morning, at noon, in the evening, and at bedtime.   Linzess 72 MCG capsule Generic drug: linaclotide Take 72 mcg by mouth daily.   metoprolol tartrate 100 MG tablet Commonly known as: LOPRESSOR Take 1 tablet (100 mg total) by mouth 2 (two) times daily.   pantoprazole 40 MG tablet Commonly known as: PROTONIX Take 1 tablet (40 mg total) by mouth daily at 6 (six) AM.   rosuvastatin 10 MG tablet Commonly known as: CRESTOR Take 10 mg by mouth at  bedtime.        Discharge Exam: Filed Weights   03/13/22 0816  Weight: 62.6 kg   General NAD  Condition at discharge: stable  The results of significant diagnostics from this hospitalization (including imaging, microbiology, ancillary and laboratory) are listed below for reference.   Imaging Studies: CT Head Wo Contrast  Result Date: 03/12/2022 CLINICAL DATA:  Altered mental status EXAM: CT HEAD WITHOUT CONTRAST CT CERVICAL SPINE WITHOUT CONTRAST TECHNIQUE: Multidetector CT imaging of the head and cervical spine was performed following the standard protocol without intravenous contrast. Multiplanar CT image reconstructions of the cervical spine were also generated. RADIATION DOSE REDUCTION: This exam was performed according to the departmental dose-optimization program which includes automated exposure control, adjustment of the mA and/or kV according to patient size and/or use of iterative reconstruction technique. COMPARISON:  03/29/2021 CT head, no  prior CT cervical spine FINDINGS: CT HEAD FINDINGS Brain: No evidence of acute infarct, hemorrhage, mass, mass effect, or midline shift. No hydrocephalus or extra-axial fluid collection. Vascular: No hyperdense vessel. Skull: Hyperostosis frontalis. Negative for fracture or focal lesion. Sinuses/Orbits: No acute finding. Other: The mastoid air cells are well aerated. CT CERVICAL SPINE FINDINGS Evaluation is somewhat limited by motion artifact. Alignment: Straightening of the normal cervical lordosis. Skull base and vertebrae: ACDF C6-C7. Evidence of osseous fusion across C5-T1, although evaluation is limited by motion. No definite acute fracture. No suspicious osseous lesion. Soft tissues and spinal canal: No prevertebral fluid or swelling. No visible canal hematoma. Disc levels: Multilevel degenerative changes, with ossification along the posterior aspect of C6, C7, and T1 causing severe spinal canal stenosis. Upper chest: No focal pulmonary opacity  in the imaged lungs. Other: None. IMPRESSION: 1.  No acute intracranial process. 2. Evaluation of the cervical spine is limited by motion. Within this limitation, no acute osseous abnormality. 3. Ossification along the posterior aspect C6, C7, and T1 causes severe spinal canal stenosis. Electronically Signed   By: Wiliam Ke M.D.   On: 03/12/2022 19:17   CT Cervical Spine Wo Contrast  Result Date: 03/12/2022 CLINICAL DATA:  Altered mental status EXAM: CT HEAD WITHOUT CONTRAST CT CERVICAL SPINE WITHOUT CONTRAST TECHNIQUE: Multidetector CT imaging of the head and cervical spine was performed following the standard protocol without intravenous contrast. Multiplanar CT image reconstructions of the cervical spine were also generated. RADIATION DOSE REDUCTION: This exam was performed according to the departmental dose-optimization program which includes automated exposure control, adjustment of the mA and/or kV according to patient size and/or use of iterative reconstruction technique. COMPARISON:  03/29/2021 CT head, no prior CT cervical spine FINDINGS: CT HEAD FINDINGS Brain: No evidence of acute infarct, hemorrhage, mass, mass effect, or midline shift. No hydrocephalus or extra-axial fluid collection. Vascular: No hyperdense vessel. Skull: Hyperostosis frontalis. Negative for fracture or focal lesion. Sinuses/Orbits: No acute finding. Other: The mastoid air cells are well aerated. CT CERVICAL SPINE FINDINGS Evaluation is somewhat limited by motion artifact. Alignment: Straightening of the normal cervical lordosis. Skull base and vertebrae: ACDF C6-C7. Evidence of osseous fusion across C5-T1, although evaluation is limited by motion. No definite acute fracture. No suspicious osseous lesion. Soft tissues and spinal canal: No prevertebral fluid or swelling. No visible canal hematoma. Disc levels: Multilevel degenerative changes, with ossification along the posterior aspect of C6, C7, and T1 causing severe spinal  canal stenosis. Upper chest: No focal pulmonary opacity in the imaged lungs. Other: None. IMPRESSION: 1.  No acute intracranial process. 2. Evaluation of the cervical spine is limited by motion. Within this limitation, no acute osseous abnormality. 3. Ossification along the posterior aspect C6, C7, and T1 causes severe spinal canal stenosis. Electronically Signed   By: Wiliam Ke M.D.   On: 03/12/2022 19:17   DG Chest Portable 1 View  Result Date: 03/12/2022 CLINICAL DATA:  Altered mental status EXAM: PORTABLE CHEST 1 VIEW COMPARISON:  03/28/2021 FINDINGS: Heart and mediastinal contours are within normal limits. No focal opacities or effusions. No acute bony abnormality. IMPRESSION: No active disease. Electronically Signed   By: Charlett Nose M.D.   On: 03/12/2022 19:05   EEG adult  Result Date: 02/25/2022 Windell Norfolk, MD     02/25/2022  5:01 PM History: 66 year old woman with history of seizure, now with multiple falls. EEG classification: Awake and drowsy Description of the recording: The background rhythms of this recording consists  of a fairly well modulated medium amplitude alpha rhythm of 8.5 Hz that is reactive to eye opening and closure. As the record progresses, the patient appears to remain in the waking state throughout the recording. Photic stimulation was performed, did not show any abnormalities. Hyperventilation was also performed, did not show any abnormalities. Toward the end of the recording, the patient enters the drowsy state with slight symmetric slowing seen. The patient never enters stage II sleep. No abnormal epileptiform discharges seen during this recording. There was no focal slowing. EKG monitor shows no evidence of cardiac rhythm abnormalities with a heart rate of 60. Abnormality: None Impression: This is a normal EEG recording in the waking and drowsy state. No evidence of interictal epileptiform discharges seen. A normal EEG does not exclude a diagnosis of epilepsy. Windell NorfolkAmadou  Camara, MD [facility]    Microbiology: Results for orders placed or performed during the hospital encounter of 03/12/22  Culture, blood (routine x 2)     Status: Abnormal   Collection Time: 03/12/22  7:44 PM   Specimen: BLOOD  Result Value Ref Range Status   Specimen Description   Final    BLOOD THUMB Performed at [facility], [address] [place], [place] [address]    Special Requests   Final    BOTTLES DRAWN AEROBIC AND ANAEROBIC Blood Culture results may not be optimal due to an inadequate volume of blood received in culture bottles Performed at [facility], [address] [place], [place] [address]    Culture  Setup Time   Final    GRAM POSITIVE COCCI ANAEROBIC BOTTLE ONLY CRITICAL RESULT CALLED TO, READ BACK BY AND VERIFIED WITH: PHARMD ERIN WILLIAMSON ON 03/13/22 @ 1618 BY DRT     Culture (A)  Final    STAPHYLOCOCCUS HOMINIS THE SIGNIFICANCE OF ISOLATING THIS ORGANISM FROM A SINGLE SET OF BLOOD CULTURES WHEN MULTIPLE SETS ARE DRAWN IS UNCERTAIN. PLEASE NOTIFY THE MICROBIOLOGY DEPARTMENT WITHIN ONE WEEK IF SPECIATION AND SENSITIVITIES ARE REQUIRED. Performed at [facility], [address] [place], [place] [address]    Report Status 03/15/2022 FINAL  Final  Blood Culture ID Panel (Reflexed)     Status: Abnormal   Collection Time: 03/12/22  7:44 PM  Result Value Ref Range Status   Enterococcus faecalis NOT DETECTED NOT DETECTED Final   Enterococcus Faecium NOT DETECTED NOT DETECTED Final   Listeria monocytogenes NOT DETECTED NOT DETECTED Final   Staphylococcus species DETECTED (A) NOT DETECTED Final    Comment: CRITICAL RESULT CALLED TO, READ BACK BY AND VERIFIED WITH: PHARMD ERIN WILLIAMSON ON 03/13/22 @ 1618 BY DRT     Staphylococcus aureus (BCID) NOT DETECTED NOT DETECTED Final   Staphylococcus epidermidis NOT DETECTED NOT DETECTED Final   Staphylococcus lugdunensis NOT DETECTED NOT DETECTED Final    Streptococcus species NOT DETECTED NOT DETECTED Final   Streptococcus agalactiae NOT DETECTED NOT DETECTED Final   Streptococcus pneumoniae NOT DETECTED NOT DETECTED Final   Streptococcus pyogenes NOT DETECTED NOT DETECTED Final   A.calcoaceticus-baumannii NOT DETECTED NOT DETECTED Final   Bacteroides fragilis NOT DETECTED NOT DETECTED Final   Enterobacterales NOT DETECTED NOT DETECTED Final   Enterobacter cloacae complex NOT DETECTED NOT DETECTED Final   Escherichia coli NOT DETECTED NOT DETECTED Final   Klebsiella aerogenes NOT DETECTED NOT DETECTED Final   Klebsiella oxytoca NOT DETECTED NOT DETECTED Final   Klebsiella pneumoniae NOT DETECTED NOT DETECTED Final   Proteus species NOT DETECTED NOT  DETECTED Final   Salmonella species NOT DETECTED NOT DETECTED Final   Serratia marcescens NOT DETECTED NOT DETECTED Final   Haemophilus influenzae NOT DETECTED NOT DETECTED Final   Neisseria meningitidis NOT DETECTED NOT DETECTED Final   Pseudomonas aeruginosa NOT DETECTED NOT DETECTED Final   Stenotrophomonas maltophilia NOT DETECTED NOT DETECTED Final   Candida albicans NOT DETECTED NOT DETECTED Final   Candida auris NOT DETECTED NOT DETECTED Final   Candida glabrata NOT DETECTED NOT DETECTED Final   Candida krusei NOT DETECTED NOT DETECTED Final   Candida parapsilosis NOT DETECTED NOT DETECTED Final   Candida tropicalis NOT DETECTED NOT DETECTED Final   Cryptococcus neoformans/gattii NOT DETECTED NOT DETECTED Final    Comment: Performed at [facility], [address] [place], [place] [address]  SARS Coronavirus 2 by RT PCR (hospital order, performed in [facility] hospital lab) *cepheid single result test* Anterior Nasal Swab     Status: None   Collection Time: 03/12/22  7:53 PM   Specimen: Anterior Nasal Swab  Result Value Ref Range Status   SARS Coronavirus 2 by RT PCR NEGATIVE NEGATIVE Final    Comment: (NOTE) SARS-CoV-2 target nucleic acids are NOT DETECTED.  The  SARS-CoV-2 RNA is generally detectable in upper and lower respiratory specimens during the acute phase of infection. The lowest concentration of SARS-CoV-2 viral copies this assay can detect is 250 copies / mL. A negative result does not preclude SARS-CoV-2 infection and should not be used as the sole basis for treatment or other patient management decisions.  A negative result may occur with improper specimen collection / handling, submission of specimen other than nasopharyngeal swab, presence of viral mutation(s) within the areas targeted by this assay, and inadequate number of viral copies (<250 copies / mL). A negative result must be combined with clinical observations, patient history, and epidemiological information.  Fact Sheet for Patients:   RoadLapTop.co.za  Fact Sheet for Healthcare Providers: http://kim-miller.com/  This test is not yet approved or  cleared by the [place] FDA and has been authorized for detection and/or diagnosis of SARS-CoV-2 by FDA under an Emergency Use Authorization (EUA).  This EUA will remain in effect (meaning this test can be used) for the duration of the COVID-19 declaration under Section 564(b)(1) of the Act, 21 U.S.C. section 360bbb-3(b)(1), unless the authorization is terminated or revoked sooner.  Performed at [facility], [address], [place], [place] [address]   Culture, blood (routine x 2)     Status: None (Preliminary result)   Collection Time: 03/12/22 11:12 PM   Specimen: BLOOD  Result Value Ref Range Status   Specimen Description   Final    BLOOD SITE NOT SPECIFIED Performed at [facility], [address], [place], [place] [address]    Special Requests   Final    BOTTLES DRAWN AEROBIC ONLY Blood Culture results may not be optimal due to an inadequate volume of blood received in culture bottles Performed at [facility], [address], [place], [place] [address]    Culture   Final    NO GROWTH 2 DAYS Performed at [facility], [address] [place], [place] [address]    Report Status PENDING  Incomplete  Urine Culture     Status: Abnormal   Collection Time: 03/13/22  7:28 AM   Specimen: Urine, Clean Catch  Result Value Ref Range Status   Specimen Description   Final  URINE, CLEAN CATCH Performed at [facility], [place] [address] [place], [place] [address]    Special Requests   Final    NONE Performed at [facility], [place] [address] [place], [place] [address]    Culture >=100,000 COLONIES/mL ESCHERICHIA COLI (A)  Final   Report Status 03/15/2022 FINAL  Final   Organism ID, Bacteria ESCHERICHIA COLI (A)  Final      Susceptibility   Escherichia coli - MIC*    AMPICILLIN <=2 SENSITIVE Sensitive     CEFAZOLIN <=4 SENSITIVE Sensitive     CEFEPIME <=0.12 SENSITIVE Sensitive     CEFTRIAXONE <=0.25 SENSITIVE Sensitive     CIPROFLOXACIN >=4 RESISTANT Resistant     GENTAMICIN <=1 SENSITIVE Sensitive     IMIPENEM <=0.25 SENSITIVE Sensitive     NITROFURANTOIN <=16 SENSITIVE Sensitive     TRIMETH/SULFA >=320 RESISTANT Resistant     AMPICILLIN/SULBACTAM <=2 SENSITIVE Sensitive     PIP/TAZO <=4 SENSITIVE Sensitive     * >=100,000 COLONIES/mL ESCHERICHIA COLI  Culture, blood (Routine X 2) w Reflex to ID Panel     Status: None (Preliminary result)   Collection Time: 03/14/22  9:38 AM   Specimen: BLOOD LEFT HAND  Result Value Ref Range Status   Specimen Description   Final    BLOOD LEFT HAND Performed at [place] [address], [place], [place] [address]    Special Requests   Final    BOTTLES DRAWN AEROBIC AND ANAEROBIC Blood Culture adequate volume Performed at [place] [address] [place], [place] [address]    Culture   Final    NO GROWTH < 24 HOURS Performed at [place] [address],  [place], [place] [address]    Report Status PENDING  Incomplete  Culture, blood (Routine X 2) w Reflex to ID Panel     Status: None (Preliminary result)   Collection Time: 03/14/22  9:47 AM   Specimen: BLOOD RIGHT HAND  Result Value Ref Range Status   Specimen Description   Final    BLOOD RIGHT HAND Performed at [place] [address], [place], [place] [address]    Special Requests   Final    BOTTLES DRAWN AEROBIC AND ANAEROBIC Blood Culture adequate volume Performed at [place] [address], [place],  [address]    Culture   Final    NO GROWTH < 24 HOURS Performed at [place] [address], [place],  [address]    Report Status PENDING  Incomplete    Labs: CBC: Recent Labs  Lab 03/12/22 1827 03/13/22 0402 03/14/22 0730 03/15/22 0533  WBC 20.2* 16.9* 13.1* 16.0*  NEUTROABS 15.3*  --   --   --   HGB 17.0* 13.4 13.6 12.6  HCT 49.0* 39.6 39.9 35.7*  MCV 97.4 101.3* 99.3 96.2  PLT 370 222 189 [id number]   Basic Metabolic Panel: Recent Labs  Lab 03/12/22 1940 03/13/22 0402 03/14/22 0730 03/15/22 0533  NA 135 135 135 138  K 3.9 2.9* 3.7 4.0  CL 99 104 99 98  CO2 19* 20* 26 30  GLUCOSE 98 85 90 90  BUN 33* 23 12 15   CREATININE 1.35* 0.79 0.67 0.72  CALCIUM 9.8 8.5* 8.7* 8.7*  MG  --   --  1.4* 1.7   Liver Function Tests: Recent Labs  Lab 03/12/22 1940 03/13/22 0402  AST 58* 38  ALT  53* 39  ALKPHOS 126 93  BILITOT 1.4* 1.0  PROT 9.0* 6.8  ALBUMIN 4.7 3.5   CBG: Recent Labs  Lab 03/12/22 1835  GLUCAP 124*    Discharge time spent: greater than 30 minutes.  Signed: Alba Cory, MD [facility] 03/15/2022

## 2022-03-15 NOTE — Plan of Care (Signed)

## 2022-03-15 NOTE — TOC CM/SW Note (Signed)
  Transition of Care [facility]) Screening Note   Patient Details  Name: Stephanie Carrillo Date of Birth: 1956-03-23   Transition of Care [facility]) CM/SW Contact:    Darleene Cleaver, LCSW Phone Number: 03/15/2022, 11:01 AM    Transition of Care Department [facility]) has reviewed patient and no TOC needs have been identified at this time. We will continue to monitor patient advancement through interdisciplinary progression rounds. If new patient transition needs arise, please place a TOC consult.

## 2022-03-15 NOTE — Progress Notes (Signed)
Patient discharge held until completion of antibiotics at 1400 per physician orders.

## 2022-03-15 NOTE — Plan of Care (Signed)
  Problem: Education: Goal: Knowledge of General Education information will improve Description Including pain rating scale, medication(s)/side effects and non-pharmacologic comfort measures Outcome: Progressing   Problem: Activity: Goal: Risk for activity intolerance will decrease Outcome: Progressing   Problem: Safety: Goal: Ability to remain free from injury will improve Outcome: Progressing   

## 2022-03-18 LAB — CULTURE, BLOOD (ROUTINE X 2): Culture: NO GROWTH

## 2022-03-19 LAB — CULTURE, BLOOD (ROUTINE X 2)
Culture: NO GROWTH
Culture: NO GROWTH
Special Requests: ADEQUATE
Special Requests: ADEQUATE

## 2022-03-23 DIAGNOSIS — S62101S Fracture of unspecified carpal bone, right wrist, sequela: Secondary | ICD-10-CM | POA: Diagnosis not present

## 2022-03-23 DIAGNOSIS — G8929 Other chronic pain: Secondary | ICD-10-CM | POA: Diagnosis not present

## 2022-03-23 DIAGNOSIS — M75121 Complete rotator cuff tear or rupture of right shoulder, not specified as traumatic: Secondary | ICD-10-CM | POA: Diagnosis not present

## 2022-03-23 DIAGNOSIS — Z79899 Other long term (current) drug therapy: Secondary | ICD-10-CM | POA: Diagnosis not present

## 2022-03-23 DIAGNOSIS — M25511 Pain in right shoulder: Secondary | ICD-10-CM | POA: Diagnosis not present

## 2022-03-25 ENCOUNTER — Encounter: Payer: Self-pay | Admitting: Neurology

## 2022-03-25 ENCOUNTER — Telehealth: Payer: Self-pay

## 2022-03-25 NOTE — Telephone Encounter (Signed)
Done

## 2022-03-25 NOTE — Telephone Encounter (Signed)
Received surgical clearance form for pt. This request received from [facility] [place] for right shoulder reverse shoulder arthroplasty under general anaesthesia.  PW placed in MD's office for review/signature if appropriate.

## 2022-03-26 NOTE — Telephone Encounter (Signed)
I have faxed the clearance to # [phone]. Confirmation received.

## 2022-04-09 ENCOUNTER — Other Ambulatory Visit: Payer: Medicare Other

## 2022-04-10 DIAGNOSIS — N179 Acute kidney failure, unspecified: Secondary | ICD-10-CM | POA: Diagnosis not present

## 2022-04-10 DIAGNOSIS — N390 Urinary tract infection, site not specified: Secondary | ICD-10-CM | POA: Diagnosis not present

## 2022-04-10 DIAGNOSIS — Z79899 Other long term (current) drug therapy: Secondary | ICD-10-CM | POA: Diagnosis not present

## 2022-04-10 DIAGNOSIS — I129 Hypertensive chronic kidney disease with stage 1 through stage 4 chronic kidney disease, or unspecified chronic kidney disease: Secondary | ICD-10-CM | POA: Diagnosis not present

## 2022-04-23 ENCOUNTER — Ambulatory Visit: Payer: Medicare Other | Admitting: Orthopedic Surgery

## 2022-05-16 ENCOUNTER — Other Ambulatory Visit: Payer: Self-pay | Admitting: Family Medicine

## 2022-05-16 DIAGNOSIS — R296 Repeated falls: Secondary | ICD-10-CM

## 2022-05-16 DIAGNOSIS — R410 Disorientation, unspecified: Secondary | ICD-10-CM

## 2022-05-17 ENCOUNTER — Ambulatory Visit
Admission: RE | Admit: 2022-05-17 | Discharge: 2022-05-17 | Disposition: A | Discharge status: Home / Self Care | Payer: Medicare Other | Source: Ambulatory Visit | Attending: Family Medicine | Admitting: Family Medicine

## 2022-05-17 DIAGNOSIS — R296 Repeated falls: Secondary | ICD-10-CM

## 2022-05-17 DIAGNOSIS — R410 Disorientation, unspecified: Secondary | ICD-10-CM

## 2022-05-22 ENCOUNTER — Ambulatory Visit
Admission: RE | Admit: 2022-05-22 | Discharge: 2022-05-22 | Disposition: A | Discharge status: Home / Self Care | Payer: Medicare Other | Source: Ambulatory Visit | Attending: Orthopedic Surgery | Admitting: Orthopedic Surgery

## 2022-05-22 DIAGNOSIS — M25511 Pain in right shoulder: Secondary | ICD-10-CM

## 2022-05-26 NOTE — Progress Notes (Signed)
Does she have f/u with us?

## 2022-05-26 NOTE — Progress Notes (Signed)
No appt had been scheduled to review scan but I Have called and scheduled OV for patient

## 2022-06-04 ENCOUNTER — Telehealth: Payer: Self-pay | Admitting: Surgical

## 2022-06-04 ENCOUNTER — Ambulatory Visit: Payer: Medicare Other | Admitting: Orthopedic Surgery

## 2022-06-04 NOTE — Telephone Encounter (Signed)
Received call from [facility] advised appointment today need to be canceled because patient is experiencing seizure activity. Holly asked if Lurena Joiner would call her so that she can explain more. The number to contact Earnest Bailey is [phone]

## 2022-06-04 NOTE — Telephone Encounter (Signed)
I called and spoke with [facility].  She states that Stephanie Carrillo has a history of seizure disorder that has been pretty well controlled over the last 20 years up until most recently where she has begun having seizure events fairly frequently.  Dr. Junius Roads is working on getting them in to see a neurologist at [facility] most likely.  They would like to hold off on any discussion about shoulder surgery until after the seizures get figured out.  I agreed that this is a good idea and recommended that Stephanie Carrillo/Stephanie Carrillo reach back out to come in to see Dr. Marlou Sa after the seizures are under control to discuss the possibility of surgery.

## 2022-06-05 NOTE — Telephone Encounter (Signed)
Thx for calling

## 2022-10-06 ENCOUNTER — Telehealth: Payer: Self-pay | Admitting: Orthopedic Surgery

## 2022-10-06 NOTE — Telephone Encounter (Signed)
Patient states she is ready for surgery and she is cleared by all drs please advise

## 2022-10-08 NOTE — Telephone Encounter (Signed)
Yes  thx

## 2022-10-09 NOTE — Telephone Encounter (Signed)
noted 

## 2022-10-09 NOTE — Telephone Encounter (Signed)
Could you please make patient follow up with Dr. Marlou Sa. He needs to see her back in the office prior to scheduling surgery. Thanks.

## 2022-10-29 ENCOUNTER — Ambulatory Visit: Payer: Self-pay | Admitting: Orthopedic Surgery

## 2022-11-10 ENCOUNTER — Ambulatory Visit: Payer: Self-pay | Admitting: Orthopedic Surgery

## 2022-12-31 ENCOUNTER — Other Ambulatory Visit: Payer: Self-pay

## 2022-12-31 ENCOUNTER — Telehealth: Payer: Self-pay | Admitting: Cardiovascular Disease

## 2022-12-31 DIAGNOSIS — R55 Syncope and collapse: Secondary | ICD-10-CM

## 2022-12-31 NOTE — Telephone Encounter (Signed)
I received a Secure Chat today from [facility] primary care doctor, Dr. Prince Rome, asking if we could see her to arrange a cardiac monitor for syncope. Can we add her onto my schedule next week on Tuesday? If she can't make this we can find a DOD spot for her over the next week or two. Thanks, chris

## 2023-01-02 NOTE — Addendum Note (Signed)
Addended by: Franchot Gallo on: 01/02/2023 09:10 AM   Modules accepted: Orders

## 2023-01-02 NOTE — Telephone Encounter (Signed)
Called and spoke with patient informing her that her PCP Dr. Prince Rome had reached out to Dr. Clifton James. It was recommended that patient wear a 30 day heart monitor and be scheduled to see Dr. Clifton James next week.  Appt with Dr. Clifton James scheduled for 01/06/23 at 2:40PM. Patient states she will need to get a ride to her appt and if she is unable to make this appt she will let us know Monday.  30 day cardiac event monitor ordered for syncope and will be mailed to patient's home. Patient verbalized understanding and expressed appreciation for call.

## 2023-01-05 NOTE — Progress Notes (Unsigned)
No chief complaint on file.   History of Present Illness: 67 yo female with history of chronic pain syndrome, anxiety, depression, HTN and seizures who is here today for follow up. I met her in November 2022 for preoperative cardiac risk assessment. *** She smoked 2 ppd for 15 years and quit 17 years ago. Echo in November 2022 with LVEF=55-60%, normal RV function. No significant valve disease. She was seen recently by Dr. Prince Rome and reported recent syncopal events. She denies any chest pain, dyspnea, palpitations, lower extremity edema, orthopnea, PND. She tells me today that she ***.   Primary Care Physician:Hilts, Casimiro Needle, MD  Past Medical History:  Diagnosis Date   Anxiety    Arthritis    Chronic kidney disease    Depression    GERD (gastroesophageal reflux disease)    Heart murmur    Hypertension    Insomnia    Meningitis    Pneumonia    Recurrent UTI    Seizures (HCC)    Substance abuse (HCC)     Past Surgical History:  Procedure Laterality Date   BACK SURGERY     CERVICAL DISC SURGERY     FRACTURE SURGERY N/A    [facility] 05/07/2020   SPINE SURGERY N/A    [facility] 05/07/2020    Current Outpatient Medications  Medication Sig Dispense Refill   acetaminophen (TYLENOL) 500 MG tablet Take 2 tablets (1,000 mg total) by mouth every 6 (six) hours as needed. 30 tablet 0   buprenorphine (SUBUTEX) 2 MG SUBL SL tablet Place 2 mg under the tongue 2 (two) times daily.     clonazePAM (KLONOPIN) 0.5 MG tablet Take 1 tablet (0.5 mg total) by mouth 3 (three) times daily as needed for anxiety. 90 tablet 1   diclofenac Sodium (VOLTAREN) 1 % GEL Apply 2 g topically daily as needed (pain).     diphenhydrAMINE (BENADRYL) 50 MG tablet Take 150 mg by mouth at bedtime as needed.     DULoxetine (CYMBALTA) 60 MG capsule Take 1 capsule (60 mg total) by mouth daily. 90 capsule 1   gabapentin (NEURONTIN) 800 MG tablet Take 800 mg by mouth 3 (three) times daily.     HYDROcodone-acetaminophen  (NORCO) 10-325 MG tablet Take 1 tablet by mouth in the morning, at noon, in the evening, and at bedtime.     LINZESS 72 MCG capsule Take 72 mcg by mouth daily.     metoprolol tartrate (LOPRESSOR) 100 MG tablet Take 1 tablet (100 mg total) by mouth 2 (two) times daily.     Multiple Vitamins-Minerals (CENTRUM SILVER 50+WOMEN) TABS Take 1 tablet by mouth daily.     pantoprazole (PROTONIX) 40 MG tablet Take 1 tablet (40 mg total) by mouth daily at 6 (six) AM. (Patient not taking: Reported on 03/12/2022) 30 tablet 1   rosuvastatin (CRESTOR) 10 MG tablet Take 10 mg by mouth at bedtime.     No current facility-administered medications for this visit.    Allergies  Allergen Reactions   Ciprofloxacin Shortness Of Breath    Respiratory arrest   Penicillins Shortness Of Breath and Rash    Did it involve swelling of the face/tongue/throat, SOB, or low BP? y Did it involve sudden or severe rash/hives, skin peeling, or any reaction on the inside of your mouth or nose? y Did you need to seek medical attention at a hospital or doctor's office? n When did it last happen?  1985 If all above answers are "NO", may proceed with cephalosporin  use.  Tolerated Ceftriaxone 10/05/20 - 10/07/20   Oxycodone Other (See Comments)    Patient in recovery process.    Social History   Socioeconomic History   Marital status: Divorced    Spouse name: Not on file   Number of children: 0   Years of education: Not on file   Highest education level: Not on file  Occupational History   Not on file  Tobacco Use   Smoking status: Former    Packs/day: 2.00    Years: 15.00    Additional pack years: 0.00    Total pack years: 30.00    Types: Cigarettes   Smokeless tobacco: Never  Vaping Use   Vaping Use: Never used  Substance and Sexual Activity   Alcohol use: Not Currently   Drug use: Not Currently    Comment: opiates -clean 6 months   Sexual activity: Not on file  Other Topics Concern   Not on file  Social History  Narrative   Not on file   Social Determinants of Health   Financial Resource Strain: Not on file  Food Insecurity: Not on file  Transportation Needs: Not on file  Physical Activity: Not on file  Stress: Not on file  Social Connections: Not on file  Intimate Partner Violence: Not on file    Family History  Problem Relation Age of Onset   Multiple sclerosis Mother    Cancer Mother        Possibly breast or colon mets to brain   Cancer Father    Lung cancer Father    Heart disease Father    Cancer Brother    Testicular cancer Brother    Prostate cancer Brother    Skin cancer Brother    Colon polyps Brother    Colon polyps Brother     Review of Systems:  As stated in the HPI and otherwise negative.   There were no vitals taken for this visit.  Physical Examination: General: Well developed, well nourished, NAD  HEENT: OP clear, mucus membranes moist  SKIN: warm, dry. No rashes. Neuro: No focal deficits  Musculoskeletal: Muscle strength 5/5 all ext  Psychiatric: Mood and affect normal  Neck: No JVD, no carotid bruits, no thyromegaly, no lymphadenopathy.  Lungs:Clear bilaterally, no wheezes, rhonci, crackles Cardiovascular: Regular rate and rhythm. No murmurs, gallops or rubs. Abdomen:Soft. Bowel sounds present. Non-tender.  Extremities: No lower extremity edema. Pulses are 2 + in the bilateral DP/PT.  EKG:  EKG is *** ordered today. The ekg is personally reviewed and shows ***  Recent Labs: 03/13/2022: ALT 39 03/15/2022: BUN 15; Creatinine, Ser 0.72; Hemoglobin 12.6; Magnesium 1.7; Platelets 207; Potassium 4.0; Sodium 138   Lipid Panel    Component Value Date/Time   CHOL 241 (H) 10/26/2020 1408   TRIG 520 (H) 10/26/2020 1408   HDL 43 (L) 10/26/2020 1408   CHOLHDL 5.6 (H) 10/26/2020 1408   LDLCALC  10/26/2020 1408     Comment:     . LDL cholesterol not calculated. Triglyceride levels greater than 400 mg/dL invalidate calculated LDL results. . Reference range:  <100 . Desirable range <100 mg/dL for primary prevention;   <70 mg/dL for patients with CHD or diabetic patients  with > or = 2 CHD risk factors. Marland Kitchen LDL-C is now calculated using the Martin-Hopkins  calculation, which is a validated novel method providing  better accuracy than the Friedewald equation in the  estimation of LDL-C.  Horald Pollen et al. Lenox Ahr. 8295;621(30): [phone]  (  http://education.[employer].com/faq/FAQ164)      Wt Readings from Last 3 Encounters:  03/13/22 62.6 kg  10/08/21 80.2 kg  07/15/21 77.2 kg   Assessment and Plan:   Syncope: ***  Labs/ tests ordered today include:  No orders of the defined types were placed in this encounter.  Disposition:   F/U with me as needed.   Signed, Verne Carrow, MD 01/05/2023 1:41 PM    [facility] [address] [place], [place], [place]  [address] Phone: [phone]; Fax: [phone]

## 2023-01-06 ENCOUNTER — Ambulatory Visit: Payer: Medicare Other | Attending: Cardiovascular Disease | Admitting: Cardiovascular Disease

## 2023-01-06 ENCOUNTER — Encounter: Payer: Self-pay | Admitting: Cardiovascular Disease

## 2023-01-06 VITALS — BP 130/80 | HR 69 | Ht 66.0 in | Wt 168.0 lb

## 2023-01-06 DIAGNOSIS — R531 Weakness: Secondary | ICD-10-CM

## 2023-01-06 NOTE — Patient Instructions (Signed)
Medication Instructions:  Your physician recommends that you continue on your current medications as directed. Please refer to the Current Medication list given to you today.  *If you need a refill on your cardiac medications before your next appointment, please call your pharmacy*   Lab Work: None ordered   Testing/Procedures: Your physician has recommended that you wear an event monitor. Event monitors are medical devices that record the heart's electrical activity. Doctors most often [place] these monitors to diagnose arrhythmias. Arrhythmias are problems with the speed or rhythm of the heartbeat. The monitor is a small, portable device. You can wear one while you do your normal daily activities. This is usually used to diagnose what is causing palpitations/syncope (passing out).   Follow-Up: As needed  Other Instructions [employer] Cardiac Event Monitor Instructions  Your physician has requested you wear your cardiac event monitor for 30 days. [employer] may call or text to confirm a shipping address. The monitor will be sent to a land address via UPS. [employer] will not ship a monitor to a PO BOX. It typically takes 3-5 days to receive your monitor after it has been enrolled. [employer] will assist with USPS tracking if your package is delayed. The telephone number for [employer] is [phone]. Once you have received your monitor, please review the enclosed instructions. Instruction tutorials can also be viewed under help and settings on the enclosed cell phone. Your monitor has already been registered assigning a specific monitor serial # to you.  Billing and Self Pay Discount Information  [employer] has been provided the insurance information we had on file for you.  If your insurance has been updated, please call [employer] at [phone] to provide them with your updated insurance information.   [employer] offers a discounted Self Pay option for patients who have insurance  that does not cover their cardiac event monitor or patients without insurance.  The discounted cost of a Self Pay Cardiac Event Monitor would be $225.00 , if the patient contacts [employer] at [phone] within 7 days of applying the monitor to make payment arrangements.  If the patient does not contact [employer] within 7 days of applying the monitor, the cost of the cardiac event monitor will be $350.00.  Applying the monitor  Remove cell phone from case and turn it on. The cell phone works as IT consultant and needs to be within [employer] of you at all times. The cell phone will need to be charged on a daily basis. We recommend you plug the cell phone into the enclosed charger at your bedside table every night.  Monitor batteries: You will receive two monitor batteries labelled #1 and #2. These are your recorders. Plug battery #2 onto the second connection on the enclosed charger. Keep one battery on the charger at all times. This will keep the monitor battery deactivated. It will also keep it fully charged for when you need to switch your monitor batteries. A small light will be blinking on the battery emblem when it is charging. The light on the battery emblem will remain on when the battery is fully charged.  Open package of a Monitor strip. Insert battery #1 into black hood on strip and gently squeeze monitor battery onto connection as indicated in instruction booklet. Set aside while preparing skin.  Choose location for your strip, vertical or horizontal, as indicated in the instruction booklet. Shave to remove all hair from location. There cannot be any lotions, oils, powders, or colognes on skin where monitor is to  be applied. Wipe skin clean with enclosed Saline wipe. Dry skin completely.  Peel paper labeled #1 off the back of the Monitor strip exposing the adhesive. Place the monitor on the chest in the vertical or horizontal position shown in the instruction booklet. One arrow  on the monitor strip must be pointing upward. Carefully remove paper labeled #2, attaching remainder of strip to your skin. Try not to create any folds or wrinkles in the strip as you apply it.  Firmly press and release the circle in the center of the monitor battery. You will hear a small beep. This is turning the monitor battery on. The heart emblem on the monitor battery will light up every 5 seconds if the monitor battery in turned on and connected to the patient securely. Do not push and hold the circle down as this turns the monitor battery off. The cell phone will locate the monitor battery. A screen will appear on the cell phone checking the connection of your monitor strip. This may read poor connection initially but change to good connection within the next minute. Once your monitor accepts the connection you will hear a series of 3 beeps followed by a climbing crescendo of beeps. A screen will appear on the cell phone showing the two monitor strip placement options. Touch the picture that demonstrates where you applied the monitor strip.  Your monitor strip and battery are waterproof. You are able to shower, bathe, or swim with the monitor on. They just ask you do not submerge deeper than 3 feet underwater. We recommend removing the monitor if you are swimming in a lake, river, or ocean.  Your monitor battery will need to be switched to a fully charged monitor battery approximately once a week. The cell phone will alert you of an action which needs to be made.  On the cell phone, tap for details to reveal connection status, monitor battery status, and cell phone battery status. The green dots indicates your monitor is in good status. A red dot indicates there is something that needs your attention.  To record a symptom, click the circle on the monitor battery. In 30-60 seconds a list of symptoms will appear on the cell phone. Select your symptom and tap save. Your monitor will  record a sustained or significant arrhythmia regardless of you clicking the button. Some patients do not feel the heart rhythm irregularities. [employer] will notify us of any serious or critical events.  Refer to instruction booklet for instructions on switching batteries, changing strips, the Do not disturb or Pause features, or any additional questions.  Call [employer] at [phone], to confirm your monitor is transmitting and record your baseline. They will answer any questions you may have regarding the monitor instructions at that time.  Returning the monitor to [employer]  Place all equipment back into blue box. Peel off strip of paper to expose adhesive and close box securely. There is a prepaid UPS shipping label on this box. Drop in a UPS drop box, or at a UPS facility like [employer]. You may also contact [employer] to arrange UPS to pick up monitor package at your home.

## 2023-01-19 DIAGNOSIS — R55 Syncope and collapse: Secondary | ICD-10-CM | POA: Diagnosis not present

## 2023-02-25 ENCOUNTER — Ambulatory Visit: Payer: Medicare Other | Attending: Cardiovascular Disease

## 2023-02-25 DIAGNOSIS — R55 Syncope and collapse: Secondary | ICD-10-CM

## 2023-02-27 ENCOUNTER — Telehealth: Payer: Self-pay | Admitting: *Deleted

## 2023-02-27 NOTE — Telephone Encounter (Signed)
Left detailed message per DPR of results and adv to call back if any questions or she would like to discuss further.

## 2023-02-27 NOTE — Telephone Encounter (Signed)
-----   Message from Verne Carrow sent at 02/25/2023  1:19 PM EDT ----- Sinus with premature beats. No changes.

## 2023-04-08 ENCOUNTER — Emergency Department (HOSPITAL_BASED_OUTPATIENT_CLINIC_OR_DEPARTMENT_OTHER): Payer: Medicare Other | Admitting: Radiology

## 2023-04-08 ENCOUNTER — Other Ambulatory Visit (HOSPITAL_BASED_OUTPATIENT_CLINIC_OR_DEPARTMENT_OTHER): Payer: Self-pay

## 2023-04-08 ENCOUNTER — Other Ambulatory Visit: Payer: Self-pay

## 2023-04-08 ENCOUNTER — Encounter (HOSPITAL_BASED_OUTPATIENT_CLINIC_OR_DEPARTMENT_OTHER): Payer: Self-pay

## 2023-04-08 ENCOUNTER — Inpatient Hospital Stay (HOSPITAL_BASED_OUTPATIENT_CLINIC_OR_DEPARTMENT_OTHER)
Admission: EM | Admit: 2023-04-08 | Discharge: 2023-04-11 | DRG: 871 | Disposition: A | Discharge status: Home / Self Care | Payer: Medicare Other | Attending: Internal Medicine | Admitting: Internal Medicine

## 2023-04-08 ENCOUNTER — Emergency Department (HOSPITAL_BASED_OUTPATIENT_CLINIC_OR_DEPARTMENT_OTHER): Payer: Medicare Other

## 2023-04-08 DIAGNOSIS — N189 Chronic kidney disease, unspecified: Secondary | ICD-10-CM | POA: Diagnosis present

## 2023-04-08 DIAGNOSIS — G9341 Metabolic encephalopathy: Secondary | ICD-10-CM | POA: Diagnosis present

## 2023-04-08 DIAGNOSIS — Z808 Family history of malignant neoplasm of other organs or systems: Secondary | ICD-10-CM

## 2023-04-08 DIAGNOSIS — A419 Sepsis, unspecified organism: Principal | ICD-10-CM | POA: Diagnosis present

## 2023-04-08 DIAGNOSIS — R6520 Severe sepsis without septic shock: Secondary | ICD-10-CM | POA: Diagnosis present

## 2023-04-08 DIAGNOSIS — K219 Gastro-esophageal reflux disease without esophagitis: Secondary | ICD-10-CM | POA: Diagnosis present

## 2023-04-08 DIAGNOSIS — Z1152 Encounter for screening for COVID-19: Secondary | ICD-10-CM

## 2023-04-08 DIAGNOSIS — Z79890 Hormone replacement therapy (postmenopausal): Secondary | ICD-10-CM

## 2023-04-08 DIAGNOSIS — E785 Hyperlipidemia, unspecified: Secondary | ICD-10-CM | POA: Diagnosis present

## 2023-04-08 DIAGNOSIS — Z820 Family history of epilepsy and other diseases of the nervous system: Secondary | ICD-10-CM

## 2023-04-08 DIAGNOSIS — N390 Urinary tract infection, site not specified: Secondary | ICD-10-CM | POA: Diagnosis present

## 2023-04-08 DIAGNOSIS — F32A Depression, unspecified: Secondary | ICD-10-CM | POA: Diagnosis present

## 2023-04-08 DIAGNOSIS — G894 Chronic pain syndrome: Secondary | ICD-10-CM | POA: Diagnosis present

## 2023-04-08 DIAGNOSIS — Z79899 Other long term (current) drug therapy: Secondary | ICD-10-CM

## 2023-04-08 DIAGNOSIS — Z83719 Family history of colon polyps, unspecified: Secondary | ICD-10-CM

## 2023-04-08 DIAGNOSIS — Z87891 Personal history of nicotine dependence: Secondary | ICD-10-CM

## 2023-04-08 DIAGNOSIS — Z8249 Family history of ischemic heart disease and other diseases of the circulatory system: Secondary | ICD-10-CM | POA: Diagnosis not present

## 2023-04-08 DIAGNOSIS — M1990 Unspecified osteoarthritis, unspecified site: Secondary | ICD-10-CM | POA: Diagnosis present

## 2023-04-08 DIAGNOSIS — Z882 Allergy status to sulfonamides status: Secondary | ICD-10-CM

## 2023-04-08 DIAGNOSIS — Z8661 Personal history of infections of the central nervous system: Secondary | ICD-10-CM

## 2023-04-08 DIAGNOSIS — Z880 Allergy status to penicillin: Secondary | ICD-10-CM

## 2023-04-08 DIAGNOSIS — I129 Hypertensive chronic kidney disease with stage 1 through stage 4 chronic kidney disease, or unspecified chronic kidney disease: Secondary | ICD-10-CM | POA: Diagnosis present

## 2023-04-08 DIAGNOSIS — N12 Tubulo-interstitial nephritis, not specified as acute or chronic: Secondary | ICD-10-CM | POA: Diagnosis present

## 2023-04-08 DIAGNOSIS — Z8701 Personal history of pneumonia (recurrent): Secondary | ICD-10-CM

## 2023-04-08 DIAGNOSIS — F419 Anxiety disorder, unspecified: Secondary | ICD-10-CM | POA: Diagnosis present

## 2023-04-08 DIAGNOSIS — Z8042 Family history of malignant neoplasm of prostate: Secondary | ICD-10-CM | POA: Diagnosis not present

## 2023-04-08 DIAGNOSIS — Z801 Family history of malignant neoplasm of trachea, bronchus and lung: Secondary | ICD-10-CM | POA: Diagnosis not present

## 2023-04-08 DIAGNOSIS — Z885 Allergy status to narcotic agent status: Secondary | ICD-10-CM

## 2023-04-08 DIAGNOSIS — E876 Hypokalemia: Secondary | ICD-10-CM | POA: Diagnosis present

## 2023-04-08 LAB — URINALYSIS, W/ REFLEX TO CULTURE (INFECTION SUSPECTED)
Bacteria, UA: NONE SEEN
Bilirubin Urine: NEGATIVE
Glucose, UA: NEGATIVE mg/dL
Ketones, ur: NEGATIVE mg/dL
Nitrite: NEGATIVE
Specific Gravity, Urine: 1.005 (ref 1.005–1.030)
pH: 5.5 (ref 5.0–8.0)

## 2023-04-08 LAB — CBC WITH DIFFERENTIAL/PLATELET
Abs Immature Granulocytes: 0.06 10*3/uL (ref 0.00–0.07)
Basophils Absolute: 0 10*3/uL (ref 0.0–0.1)
Basophils Relative: 0 %
Eosinophils Absolute: 0 10*3/uL (ref 0.0–0.5)
Eosinophils Relative: 0 %
HCT: 41 % (ref 36.0–46.0)
Hemoglobin: 14.1 g/dL (ref 12.0–15.0)
Immature Granulocytes: 0 %
Lymphocytes Relative: 5 %
Lymphs Abs: 0.7 10*3/uL (ref 0.7–4.0)
MCH: 34.1 pg — ABNORMAL HIGH (ref 26.0–34.0)
MCHC: 34.4 g/dL (ref 30.0–36.0)
MCV: 99.3 fL (ref 80.0–100.0)
Monocytes Absolute: 1.9 10*3/uL — ABNORMAL HIGH (ref 0.1–1.0)
Monocytes Relative: 14 %
Neutro Abs: 11.5 10*3/uL — ABNORMAL HIGH (ref 1.7–7.7)
Neutrophils Relative %: 81 %
Platelets: 215 10*3/uL (ref 150–400)
RBC: 4.13 MIL/uL (ref 3.87–5.11)
RDW: 11.9 % (ref 11.5–15.5)
WBC: 14.2 10*3/uL — ABNORMAL HIGH (ref 4.0–10.5)
nRBC: 0 % (ref 0.0–0.2)

## 2023-04-08 LAB — COMPREHENSIVE METABOLIC PANEL
ALT: 10 U/L (ref 0–44)
AST: 15 U/L (ref 15–41)
Albumin: 4.3 g/dL (ref 3.5–5.0)
Alkaline Phosphatase: 108 U/L (ref 38–126)
Anion gap: 13 (ref 5–15)
BUN: 17 mg/dL (ref 8–23)
CO2: 22 mmol/L (ref 22–32)
Calcium: 8.6 mg/dL — ABNORMAL LOW (ref 8.9–10.3)
Chloride: 98 mmol/L (ref 98–111)
Creatinine, Ser: 1.08 mg/dL — ABNORMAL HIGH (ref 0.44–1.00)
GFR, Estimated: 56 mL/min — ABNORMAL LOW (ref >60)
Glucose, Bld: 113 mg/dL — ABNORMAL HIGH (ref 70–99)
Potassium: 3 mmol/L — ABNORMAL LOW (ref 3.5–5.1)
Sodium: 133 mmol/L — ABNORMAL LOW (ref 135–145)
Total Bilirubin: 1 mg/dL (ref 0.3–1.2)
Total Protein: 8.3 g/dL — ABNORMAL HIGH (ref 6.5–8.1)

## 2023-04-08 LAB — PHOSPHORUS: Phosphorus: 3.1 mg/dL (ref 2.5–4.6)

## 2023-04-08 LAB — RESP PANEL BY RT-PCR (RSV, FLU A&B, COVID)  RVPGX2
Influenza A by PCR: NEGATIVE
Influenza B by PCR: NEGATIVE
Resp Syncytial Virus by PCR: NEGATIVE
SARS Coronavirus 2 by RT PCR: NEGATIVE

## 2023-04-08 LAB — PROTIME-INR
INR: 1.2 (ref 0.8–1.2)
Prothrombin Time: 14.9 s (ref 11.4–15.2)

## 2023-04-08 LAB — MAGNESIUM: Magnesium: 1.5 mg/dL — ABNORMAL LOW (ref 1.7–2.4)

## 2023-04-08 LAB — LACTIC ACID, PLASMA
Lactic Acid, Venous: 0.8 mmol/L (ref 0.5–1.9)
Lactic Acid, Venous: 0.9 mmol/L (ref 0.5–1.9)

## 2023-04-08 MED ORDER — BUPRENORPHINE HCL 2 MG SL SUBL
2.0000 mg | SUBLINGUAL_TABLET | Freq: Two times a day (BID) | SUBLINGUAL | Status: DC
  Administered 2023-04-09 – 2023-04-11 (×5): 2 mg via SUBLINGUAL
  Filled 2023-04-08 (×5): qty 1

## 2023-04-08 MED ORDER — PANTOPRAZOLE SODIUM 40 MG PO TBEC
40.0000 mg | DELAYED_RELEASE_TABLET | Freq: Every day | ORAL | Status: DC
  Administered 2023-04-09 – 2023-04-11 (×3): 40 mg via ORAL
  Filled 2023-04-08 (×3): qty 1

## 2023-04-08 MED ORDER — ENOXAPARIN SODIUM 40 MG/0.4ML IJ SOSY
40.0000 mg | PREFILLED_SYRINGE | INTRAMUSCULAR | Status: DC
  Administered 2023-04-08 – 2023-04-10 (×3): 40 mg via SUBCUTANEOUS
  Filled 2023-04-08 (×3): qty 0.4

## 2023-04-08 MED ORDER — ONDANSETRON HCL 4 MG PO TABS: 4.0000 mg | ORAL_TABLET | Freq: Four times a day (QID) | ORAL | Status: DC | PRN

## 2023-04-08 MED ORDER — PROCHLORPERAZINE EDISYLATE 10 MG/2ML IJ SOLN
5.0000 mg | Freq: Four times a day (QID) | INTRAMUSCULAR | Status: AC | PRN
  Administered 2023-04-08 – 2023-04-09 (×2): 5 mg via INTRAVENOUS
  Filled 2023-04-08 (×2): qty 2

## 2023-04-08 MED ORDER — CLONAZEPAM 0.5 MG PO TABS
0.5000 mg | ORAL_TABLET | ORAL | Status: AC
  Administered 2023-04-08: 0.5 mg via ORAL
  Filled 2023-04-08: qty 1

## 2023-04-08 MED ORDER — CLONAZEPAM 0.5 MG PO TABS
0.5000 mg | ORAL_TABLET | Freq: Three times a day (TID) | ORAL | Status: DC | PRN
  Administered 2023-04-09 – 2023-04-11 (×3): 0.5 mg via ORAL
  Filled 2023-04-08 (×3): qty 1

## 2023-04-08 MED ORDER — ROSUVASTATIN CALCIUM 10 MG PO TABS
10.0000 mg | ORAL_TABLET | Freq: Every day | ORAL | Status: DC
  Administered 2023-04-09 – 2023-04-10 (×2): 10 mg via ORAL
  Filled 2023-04-08 (×2): qty 1

## 2023-04-08 MED ORDER — ONDANSETRON HCL 4 MG/2ML IJ SOLN
4.0000 mg | Freq: Four times a day (QID) | INTRAMUSCULAR | Status: DC | PRN
  Administered 2023-04-08 – 2023-04-09 (×2): 4 mg via INTRAVENOUS
  Filled 2023-04-08 (×2): qty 2

## 2023-04-08 MED ORDER — MAGNESIUM GLUCONATE 500 MG PO TABS
500.0000 mg | ORAL_TABLET | Freq: Every day | ORAL | Status: DC
  Administered 2023-04-09 – 2023-04-11 (×3): 500 mg via ORAL
  Filled 2023-04-08 (×3): qty 1

## 2023-04-08 MED ORDER — GABAPENTIN 800 MG PO TABS: 800.0000 mg | ORAL_TABLET | Freq: Three times a day (TID) | ORAL | Status: DC

## 2023-04-08 MED ORDER — POTASSIUM CHLORIDE CRYS ER 20 MEQ PO TBCR
40.0000 meq | EXTENDED_RELEASE_TABLET | Freq: Once | ORAL | Status: AC
  Administered 2023-04-08: 40 meq via ORAL
  Filled 2023-04-08: qty 2

## 2023-04-08 MED ORDER — METOPROLOL TARTRATE 50 MG PO TABS: 100.0000 mg | ORAL_TABLET | Freq: Two times a day (BID) | ORAL | Status: DC

## 2023-04-08 MED ORDER — IOHEXOL 300 MG/ML  SOLN
100.0000 mL | Freq: Once | INTRAMUSCULAR | Status: AC | PRN
  Administered 2023-04-08: 85 mL via INTRAVENOUS

## 2023-04-08 MED ORDER — HYDROCODONE-ACETAMINOPHEN 10-325 MG PO TABS
1.0000 | ORAL_TABLET | Freq: Four times a day (QID) | ORAL | Status: DC | PRN
  Administered 2023-04-08: 1 via ORAL
  Filled 2023-04-08: qty 1

## 2023-04-08 MED ORDER — MAGNESIUM SULFATE 2 GM/50ML IV SOLN
2.0000 g | Freq: Once | INTRAVENOUS | Status: AC
  Administered 2023-04-08: 2 g via INTRAVENOUS
  Filled 2023-04-08: qty 50

## 2023-04-08 MED ORDER — HYDROCODONE-ACETAMINOPHEN 5-325 MG PO TABS
2.0000 | ORAL_TABLET | Freq: Once | ORAL | Status: DC
  Filled 2023-04-08: qty 2

## 2023-04-08 MED ORDER — BUPRENORPHINE HCL 2 MG SL SUBL
2.0000 mg | SUBLINGUAL_TABLET | Freq: Once | SUBLINGUAL | Status: AC
  Administered 2023-04-08: 2 mg via SUBLINGUAL
  Filled 2023-04-08: qty 1

## 2023-04-08 MED ORDER — LACTATED RINGERS IV BOLUS
1500.0000 mL | Freq: Once | INTRAVENOUS | Status: AC
  Administered 2023-04-08: 1500 mL via INTRAVENOUS

## 2023-04-08 MED ORDER — ACETAMINOPHEN 325 MG PO TABS
650.0000 mg | ORAL_TABLET | Freq: Four times a day (QID) | ORAL | Status: DC | PRN
  Administered 2023-04-08 – 2023-04-11 (×3): 650 mg via ORAL
  Filled 2023-04-08 (×3): qty 2

## 2023-04-08 MED ORDER — VITAMIN B-12 100 MCG PO TABS
100.0000 ug | ORAL_TABLET | Freq: Every day | ORAL | Status: DC
  Administered 2023-04-08 – 2023-04-11 (×4): 100 ug via ORAL
  Filled 2023-04-08 (×4): qty 1

## 2023-04-08 MED ORDER — FLUOXETINE HCL 20 MG PO CAPS
40.0000 mg | ORAL_CAPSULE | Freq: Every day | ORAL | Status: DC
  Administered 2023-04-08 – 2023-04-11 (×4): 40 mg via ORAL
  Filled 2023-04-08 (×4): qty 2

## 2023-04-08 MED ORDER — ACETAMINOPHEN 650 MG RE SUPP: 650.0000 mg | Freq: Four times a day (QID) | RECTAL | Status: DC | PRN

## 2023-04-08 MED ORDER — SODIUM CHLORIDE 0.9 % IV SOLN
1.0000 g | Freq: Once | INTRAVENOUS | Status: AC
  Administered 2023-04-08: 1 g via INTRAVENOUS
  Filled 2023-04-08: qty 10

## 2023-04-08 MED ORDER — ACETAMINOPHEN 500 MG PO TABS
1000.0000 mg | ORAL_TABLET | Freq: Once | ORAL | Status: AC
  Administered 2023-04-08: 1000 mg via ORAL
  Filled 2023-04-08: qty 2

## 2023-04-08 MED ORDER — LACTATED RINGERS IV SOLN: INTRAVENOUS | Status: DC

## 2023-04-08 MED ORDER — TIZANIDINE HCL 4 MG PO TABS: 4.0000 mg | ORAL_TABLET | Freq: Three times a day (TID) | ORAL | Status: DC | PRN

## 2023-04-08 MED ORDER — SODIUM CHLORIDE 0.9 % IV SOLN
1.0000 g | INTRAVENOUS | Status: DC
  Administered 2023-04-09 – 2023-04-10 (×2): 1 g via INTRAVENOUS
  Filled 2023-04-08 (×2): qty 10

## 2023-04-08 MED ORDER — POTASSIUM CHLORIDE IN NACL 20-0.9 MEQ/L-% IV SOLN
INTRAVENOUS | Status: AC
  Filled 2023-04-08 (×2): qty 1000

## 2023-04-08 MED ORDER — GABAPENTIN 300 MG PO CAPS
300.0000 mg | ORAL_CAPSULE | Freq: Three times a day (TID) | ORAL | Status: DC
  Administered 2023-04-08 – 2023-04-11 (×9): 300 mg via ORAL
  Filled 2023-04-08 (×9): qty 1

## 2023-04-08 NOTE — ED Notes (Signed)
Called  [facility] to transport patient to [facility] Rm# [id number]

## 2023-04-08 NOTE — ED Triage Notes (Signed)
Pt arrived via [facility] c/o high fever; Pt w/ hx of UTI c/o burning with urination as well as minor hematuria. Pt febrile and tachycardic in triage

## 2023-04-08 NOTE — H&P (Signed)
History and Physical    Patient: Stephanie Carrillo WRU:[id number] DOB: [birth year] DOA: 04/08/2023 DOS: the patient was seen and examined on 04/08/2023 PCP: Lavada Mesi, MD  Patient coming from: Home  Chief Complaint:  Chief Complaint  Patient presents with   Dysuria   HPI: Stephanie Carrillo is a 67 y.o. female with medical history significant of anxiety, depression, osteoarthritis, heart murmur, hypertension, hyperlipidemia, insomnia, unspecified CKD, history of meningitis, history of pneumonia, seizures, substance abuse, recurrent UTIs with history of E. coli sepsis/bacteremia who is coming to the emergency department with complaints of dysuria for several days, altered mental status and fever since earlier today.  She is still mildly confused with inability to elaborate extensively, but feeling better.  She is already partially oriented to time and date. He denied fever, chills, rhinorrhea, sore throat, wheezing or hemoptysis.  No chest pain, palpitations, diaphoresis, PND, orthopnea or recent pitting edema of the lower extremities.  No abdominal pain, nausea, emesis, diarrhea, constipation, melena or hematochezia.  No polyuria, polydipsia, polyphagia or blurred vision.   Lab work: Urinalysis shows small hemoglobin, trace protein and small leukocyte esterase.  Coronavirus, influenza and RSV PCR negative.  CBC showed a white count of 14.2, hemoglobin 14.1 g/dL platelets 914.  Normal PT and INR.  Normal lactic acid x 2.  Magnesium 1.5 and phosphorus 3.1 mg/dL.  CMP showed a sodium 133, potassium 3.0 mmol/L.  Normal chloride, CO2 and anion gap.  Glucose 113, creatinine 1.08, BUN 17 and calcium 8.6 mg/dL.  LFTs were normal except for total protein of 8.3 g/dL.  Imaging: Underinflated, but negative chest radiograph.  CT head was negative.  CT abdomen/pelvis with contrast with signs of right pyelonephritis.  There was also sick referential bladder wall thickening.  Left basilar atelectasis or scar.   ED  course: Initial vital signs were temperature 102 F, pulse 102, respiration 20, BP 137/64 mmHg O2 sat 95% on room air.  The patient received acetaminophen 1000 mg p.o., Norco 5/325 mg 2 tablets p.o. x 1, K-Lor 40 mEq p.o. x 1, LR 1500 mL bolus and ceftriaxone 1 g IVPB x 1.  Review of Systems: As mentioned in the history of present illness. All other systems reviewed and are negative. Past Medical History:  Diagnosis Date   Anxiety    Arthritis    Chronic kidney disease    Depression    GERD (gastroesophageal reflux disease)    Heart murmur    Hypertension    Insomnia    Meningitis    Pneumonia    Recurrent UTI    Seizures (HCC)    Substance abuse [facility])    Past Surgical History:  Procedure Laterality Date   BACK SURGERY     CERVICAL DISC SURGERY     FRACTURE SURGERY N/A    [employer] 05/07/2020   SPINE SURGERY N/A    [employer] 05/07/2020   Social History:  reports that she has quit smoking. Her smoking use included cigarettes. She has a 30 pack-year smoking history. She has never used smokeless tobacco. She reports that she does not currently use alcohol. She reports that she does not currently use drugs.  Allergies  Allergen Reactions   Ciprofloxacin Shortness Of Breath    Respiratory arrest   Penicillins Shortness Of Breath and Rash    Did it involve swelling of the face/tongue/throat, SOB, or low BP? y Did it involve sudden or severe rash/hives, skin peeling, or any reaction on the inside of your mouth or nose? y Did  you need to seek medical attention at a hospital or doctor's office? n When did it last happen?  1985 If all above answers are "NO", may proceed with cephalosporin use.  Tolerated Ceftriaxone 10/05/20 - 10/07/20   Oxycodone Other (See Comments)    Patient in recovery process.    Family History  Problem Relation Age of Onset   Multiple sclerosis Mother    Cancer Mother        Possibly breast or colon mets to brain   Cancer Father    Lung cancer Father     Heart disease Father    Cancer Brother    Testicular cancer Brother    Prostate cancer Brother    Skin cancer Brother    Colon polyps Brother    Colon polyps Brother     Prior to Admission medications   Medication Sig Start Date End Date Taking? Authorizing Provider  buprenorphine (SUBUTEX) 2 MG SUBL SL tablet Place 2 mg under the tongue 2 (two) times daily. 07/02/21  Yes [provider]  clonazePAM (KLONOPIN) 0.5 MG tablet Take 1 tablet (0.5 mg total) by mouth 3 (three) times daily as needed for anxiety. 04/15/21  Yes Eldred Manges, MD  cyanocobalamin (VITAMIN B12) 100 MCG tablet Take 100 mcg by mouth daily.   Yes [provider]  diphenhydrAMINE (BENADRYL) 50 MG tablet Take 150 mg by mouth at bedtime as needed.   Yes [provider]  FLUoxetine (PROZAC) 40 MG capsule Take 40 mg by mouth daily.   Yes [provider]  gabapentin (NEURONTIN) 800 MG tablet Take 800 mg by mouth 3 (three) times daily.   Yes [provider]  HYDROcodone-acetaminophen (NORCO) 10-325 MG tablet Take 1 tablet by mouth in the morning, at noon, in the evening, and at bedtime. 06/04/21  Yes [provider]  levothyroxine (SYNTHROID) 50 MCG tablet Take 50 mcg by mouth daily.   Yes [provider]  lisinopril (ZESTRIL) 10 MG tablet Take 10 mg by mouth as needed. Taking when blood pressure is 160's (abnormal will take) 04/20/21  Yes [provider]  magnesium gluconate (MAGONATE) 500 MG tablet Take 500 mg by mouth daily.   Yes [provider]  metoprolol tartrate (LOPRESSOR) 100 MG tablet Take 1 tablet (100 mg total) by mouth 2 (two) times daily. 04/05/21  Yes Meredeth Ide, MD  Multiple Vitamins-Minerals (CENTRUM SILVER 50+WOMEN) TABS Take 1 tablet by mouth daily.   Yes [provider]  pantoprazole (PROTONIX) 40 MG tablet Take 1 tablet (40 mg total) by mouth daily at 6 (six) AM. 10/08/20  Yes Rodolph Bong, MD  rosuvastatin (CRESTOR)  10 MG tablet Take 10 mg by mouth at bedtime. 07/02/21  Yes [provider]  tiZANidine (ZANAFLEX) 4 MG tablet Take 4 mg by mouth 3 (three) times daily as needed.   Yes [provider]  acetaminophen (TYLENOL) 500 MG tablet Take 2 tablets (1,000 mg total) by mouth every 6 (six) hours as needed. 04/05/21   Meredeth Ide, MD  MACROBID 100 MG capsule Take 100 mg by mouth 2 (two) times daily.    [provider]    Physical Exam: Vitals:   04/08/23 1400 04/08/23 1411 04/08/23 1527 04/08/23 1545  BP: 130/72  133/72 110/84  Pulse: 88  93 95  Resp:   20 14  Temp:  99.8 F (37.7 C) 98.6 F (37 C) 98.4 F (36.9 C)  TempSrc:  Oral Oral Oral  SpO2: 99%  95% 93%  Weight:      Height:       Physical Exam Vitals and nursing note reviewed.  Constitutional:      General: She is awake. She is not in acute distress.    Appearance: Normal appearance. She is ill-appearing. She is not toxic-appearing.  HENT:     Head: Normocephalic.     Nose: No rhinorrhea.     Mouth/Throat:     Mouth: Mucous membranes are moist.  Eyes:     General: No scleral icterus.    Pupils: Pupils are equal, round, and reactive to light.  Neck:     Vascular: No JVD.  Cardiovascular:     Rate and Rhythm: Normal rate and regular rhythm.     Heart sounds: S1 normal and S2 normal.  Pulmonary:     Effort: Pulmonary effort is normal.     Breath sounds: Normal breath sounds. No wheezing, rhonchi or rales.  Abdominal:     General: Bowel sounds are normal. There is no distension.     Palpations: Abdomen is soft.     Tenderness: There is abdominal tenderness in the suprapubic area. There is no right CVA tenderness, left CVA tenderness, guarding or rebound.  Musculoskeletal:     Cervical back: Neck supple.     Right lower leg: No edema.     Left lower leg: No edema.  Skin:    General: Skin is warm and dry.  Neurological:     General: No focal deficit present.     Mental Status: She is alert. She is  disoriented.  Psychiatric:        Mood and Affect: Mood normal.        Behavior: Behavior normal. Behavior is cooperative.     Data Reviewed:  Results are pending, will review when available.  Assessment and Plan: Principal Problem:   Acute metabolic encephalopathy In the setting of:   Sepsis secondary to UTI (HCC) Admit to PCU/inpatient. Neurochecks every shift. Acetaminophen as needed for fever. Antiemetics and analgesics as needed. Continue ceftriaxone 1 g IVPB daily. Follow urine culture and sensitivity. Follow-up blood culture and sensitivity. Follow-up CBC and chemistry in the morning.  Active Problems:   Anxiety and depression Continue clonazepam 0.5 mg p.o. 3 times daily as needed. Continue fluoxetine 40 mg p.o. daily.    Chronic pain syndrome Continue Norco 10/325 mg p.o. 4 times daily as needed. Continue gabapentin 300 mg p.o. 3 times daily..    Hypokalemia Supplementing. Follow-up potassium level in AM.    Hypomagnesemia Magnesium sulfate 2 g IVPB. Continue magnesium gluconate 500 mg p.o. daily.    Hyperlipidemia Continue rosuvastatin 10 mg p.o. daily.    Advance Care Planning:   Code Status: Full Code   Consults:   Family Communication:   Severity of Illness: The appropriate patient status for this patient is INPATIENT. Inpatient status is judged to be reasonable and necessary in order to provide the required intensity of service to ensure the patient's safety. The patient's presenting symptoms, physical exam findings, and initial radiographic and laboratory data in the context of their chronic comorbidities is felt to place them at high risk for further clinical deterioration. Furthermore, it is not anticipated that the patient will be medically stable for discharge from the hospital within 2 midnights of admission.   * I certify that at the point of admission it is my clinical judgment that the patient will require inpatient hospital care spanning  beyond 2 midnights from  the point of admission due to high intensity of service, high risk for further deterioration and high frequency of surveillance required.*  Author: Bobette Mo, MD 04/08/2023 4:08 PM  For on call review [email or web address].   This document was prepared using Dragon voice recognition software and may contain some unintended transcription errors.

## 2023-04-08 NOTE — Plan of Care (Signed)
  Problem: Education: Goal: Knowledge of General Education information will improve Description Including pain rating scale, medication(s)/side effects and non-pharmacologic comfort measures Outcome: Progressing   

## 2023-04-08 NOTE — ED Provider Notes (Addendum)
[facility] AT [facility] Provider Note   CSN: [id number] Arrival date & time: 04/08/23  0900     History  Chief Complaint  Patient presents with   Dysuria    Stephanie Carrillo is a 67 y.o. female with past medical history significant for hypertension, previous history of substance abuse, current narcotic, benzo use chronically who presents concern for high fever, endorses dysuria, minor hematuria.  Patient arrives with fever, elevated heart rate.  She endorses some headache, denies any chest pain, shortness of breath.  She was notably admitted last year with sepsis secondary to UTI with metabolic encephalopathy at that time.  Patient is somewhat confused during interview, she is alert and oriented to name, but not time, somewhat confused about place although correctly identifies [place].   Dysuria      Home Medications Prior to Admission medications   Medication Sig Start Date End Date Taking? Authorizing Provider  buprenorphine (SUBUTEX) 2 MG SUBL SL tablet Place 2 mg under the tongue 2 (two) times daily. 07/02/21  Yes [provider]  clonazePAM (KLONOPIN) 0.5 MG tablet Take 1 tablet (0.5 mg total) by mouth 3 (three) times daily as needed for anxiety. 04/15/21  Yes Eldred Manges, MD  cyanocobalamin (VITAMIN B12) 100 MCG tablet Take 100 mcg by mouth daily.   Yes [provider]  diphenhydrAMINE (BENADRYL) 50 MG tablet Take 150 mg by mouth at bedtime as needed.   Yes [provider]  FLUoxetine (PROZAC) 40 MG capsule Take 40 mg by mouth daily.   Yes [provider]  gabapentin (NEURONTIN) 800 MG tablet Take 800 mg by mouth 3 (three) times daily.   Yes [provider]  HYDROcodone-acetaminophen (NORCO) 10-325 MG tablet Take 1 tablet by mouth in the morning, at noon, in the evening, and at bedtime. 06/04/21  Yes [provider]  levothyroxine (SYNTHROID) 50 MCG tablet Take 50 mcg by mouth daily.   Yes  [provider]  lisinopril (ZESTRIL) 10 MG tablet Take 10 mg by mouth as needed. Taking when blood pressure is 160's (abnormal will take) 04/20/21  Yes [provider]  magnesium gluconate (MAGONATE) 500 MG tablet Take 500 mg by mouth daily.   Yes [provider]  metoprolol tartrate (LOPRESSOR) 100 MG tablet Take 1 tablet (100 mg total) by mouth 2 (two) times daily. 04/05/21  Yes Meredeth Ide, MD  Multiple Vitamins-Minerals (CENTRUM SILVER 50+WOMEN) TABS Take 1 tablet by mouth daily.   Yes [provider]  pantoprazole (PROTONIX) 40 MG tablet Take 1 tablet (40 mg total) by mouth daily at 6 (six) AM. 10/08/20  Yes Rodolph Bong, MD  rosuvastatin (CRESTOR) 10 MG tablet Take 10 mg by mouth at bedtime. 07/02/21  Yes [provider]  tiZANidine (ZANAFLEX) 4 MG tablet Take 4 mg by mouth 3 (three) times daily as needed.   Yes [provider]  acetaminophen (TYLENOL) 500 MG tablet Take 2 tablets (1,000 mg total) by mouth every 6 (six) hours as needed. 04/05/21   Meredeth Ide, MD  MACROBID 100 MG capsule Take 100 mg by mouth 2 (two) times daily.    [provider]      Allergies    Ciprofloxacin, Penicillins, and Oxycodone    Review of Systems   Review of Systems  Genitourinary:  Positive for dysuria.  All other systems reviewed and are negative.   Physical Exam Updated Vital Signs BP 110/84 (BP Location: Right Arm)  Pulse 95   Temp 98.4 F (36.9 C) (Oral)   Resp 14   Ht 5\' 6"  (1.676 m)   Wt 70.3 kg   SpO2 93%   BMI 25.02 kg/m  Physical Exam Vitals and nursing note reviewed.  Constitutional:      General: She is not in acute distress.    Appearance: Normal appearance.  HENT:     Head: Normocephalic and atraumatic.  Eyes:     General:        Right eye: No discharge.        Left eye: No discharge.  Neck:     Comments: No neck rigidity, no meningismus Cardiovascular:     Rate and Rhythm: Regular rhythm. Tachycardia  present.     Heart sounds: No murmur heard.    No friction rub. No gallop.  Pulmonary:     Effort: Pulmonary effort is normal.     Breath sounds: Normal breath sounds.  Abdominal:     General: Bowel sounds are normal.     Palpations: Abdomen is soft.     Comments: Some diffuse tenderness palpation of the abdomen, no rebound, rigidity, guarding  Musculoskeletal:     Cervical back: No tenderness.  Skin:    General: Skin is warm and dry.     Capillary Refill: Capillary refill takes less than 2 seconds.  Neurological:     Mental Status: She is alert.     Comments: Patient alert and oriented to self but not time, place, some answers to questions are somewhat nonsensical, but does follow commands spontaneously.  Can move all 4 limbs spontaneously.  CN II through XII grossly intact.  Psychiatric:        Mood and Affect: Mood normal.        Behavior: Behavior normal.     ED Results / Procedures / Treatments   Labs (all labs ordered are listed, but only abnormal results are displayed) Labs Reviewed  COMPREHENSIVE METABOLIC PANEL - Abnormal; Notable for the following components:      Result Value   Sodium 133 (*)    Potassium 3.0 (*)    Glucose, Bld 113 (*)    Creatinine, Ser 1.08 (*)    Calcium 8.6 (*)    Total Protein 8.3 (*)    GFR, Estimated 56 (*)    All other components within normal limits  CBC WITH DIFFERENTIAL/PLATELET - Abnormal; Notable for the following components:   WBC 14.2 (*)    MCH 34.1 (*)    Neutro Abs 11.5 (*)    Monocytes Absolute 1.9 (*)    All other components within normal limits  URINALYSIS, W/ REFLEX TO CULTURE (INFECTION SUSPECTED) - Abnormal; Notable for the following components:   Hgb urine dipstick SMALL (*)    Protein, ur TRACE (*)    Leukocytes,Ua SMALL (*)    All other components within normal limits  MAGNESIUM - Abnormal; Notable for the following components:   Magnesium 1.5 (*)    All other components within normal limits  CULTURE, BLOOD  (ROUTINE X 2)  RESP PANEL BY RT-PCR (RSV, FLU A&B, COVID)  [id number]  CULTURE, BLOOD (ROUTINE X 2)  URINE CULTURE  LACTIC ACID, PLASMA  LACTIC ACID, PLASMA  PROTIME-INR  PHOSPHORUS  HIV ANTIBODY (ROUTINE TESTING W REFLEX)    EKG None  Radiology CT CHEST ABDOMEN PELVIS W CONTRAST  Result Date: 04/08/2023 CLINICAL DATA:  Sepsis Tech note: Pt arrived via [facility] c/o high fever; Pt w/ hx of UTI  c/o burning with urination as well as minor hematuria. Pt febrile and tachycardic in triage Hx of CKD, substance abuse, back surgery EXAM: CT CHEST, ABDOMEN, AND PELVIS WITH CONTRAST TECHNIQUE: Multidetector CT imaging of the chest, abdomen and pelvis was performed following the standard protocol during bolus administration of intravenous contrast. RADIATION DOSE REDUCTION: This exam was performed according to the departmental dose-optimization program which includes automated exposure control, adjustment of the mA and/or kV according to patient size and/or use of iterative reconstruction technique. CONTRAST:  85mL OMNIPAQUE IOHEXOL 300 MG/ML  SOLN COMPARISON:  None Available. FINDINGS: CT CHEST FINDINGS Cardiovascular: No significant vascular findings. Normal heart size. No pericardial effusion. Mediastinum/Nodes: No enlarged mediastinal, hilar, or axillary lymph nodes. Thyroid gland, trachea, and esophagus demonstrate no significant findings. Lungs/Pleura: Linear left basilar opacities, likely atelectasis/scar. Otherwise, lungs are clear. No pleural effusions or pneumothorax. Musculoskeletal: 6 seconds no chest wall mass or suspicious bone lesions identified. CT ABDOMEN PELVIS FINDINGS Hepatobiliary: No focal liver abnormality is seen. No gallstones, gallbladder wall thickening, or biliary dilatation. Pancreas: Unremarkable. No pancreatic ductal dilatation or surrounding inflammatory changes. Spleen: Normal in size without focal abnormality. Adrenals/Urinary Tract: Normal adrenal glands. mildly asymmetric right  greater than left perinephric. A few areas of ill-defined hypoattenuation in the right kidney. No hydronephrosis. Circumferential bladder wall thickening. The bladder is decompressed with Foley catheter in place. Small amount of bladder gas is likely from instrumentation. Stomach/Bowel: Stomach is within normal limits. No evidence of bowel wall thickening, distention, or inflammatory changes. Moderate to large amount of stool in the distal colon and rectum. Vascular/Lymphatic: Aorta bi-iliac calcific atherosclerosis. Reproductive: Status post hysterectomy. No adnexal masses. Other: No significant CP abdominal wall hernia or abnormality. No abdominopelvic ascites. Musculoskeletal: Multilevel spine degenerative change with multilevel lumbar ankylosis. IMPRESSION: 1. A few areas of ill-defined hypoattenuation in the right kidney which are nonspecific but suspicious for early pyelonephritis given the reported clinical history and mildly asymmetric right greater than left perinephric stranding. A follow-up ultrasound after treatment is recommended to exclude a renal lesion. 2. Circumferential bladder wall thickening which could be due to bladder decompression or cystitis. 3. Left basilar atelectasis or scar. Electronically Signed   By: Feliberto Harts M.D.   On: 04/08/2023 13:02   CT Head Wo Contrast  Result Date: 04/08/2023 CLINICAL DATA:  Headache, fever EXAM: CT HEAD WITHOUT CONTRAST TECHNIQUE: Contiguous axial images were obtained from the base of the skull through the vertex without intravenous contrast. RADIATION DOSE REDUCTION: This exam was performed according to the departmental dose-optimization program which includes automated exposure control, adjustment of the mA and/or kV according to patient size and/or use of iterative reconstruction technique. COMPARISON:  Brain MRI 05/17/2022 FINDINGS: Brain: There is no acute intracranial hemorrhage, extra-axial fluid collection, or acute infarct Parenchymal  volume is normal. The ventricles are normal in size. Gray-white differentiation is preserved The pituitary and suprasellar region are normal. There is no mass lesion. There is no mass effect or midline shift. Vascular: No hyperdense vessel or unexpected calcification. Skull: Normal. Negative for fracture or focal lesion. Sinuses/Orbits: The imaged paranasal sinuses are clear. The globes and orbits are unremarkable. Other: The mastoid air cells and middle ear cavities are clear. IMPRESSION: No acute intracranial pathology. Electronically Signed   By: Lesia Hausen M.D.   On: 04/08/2023 11:47   DG Chest 2 View  Result Date: 04/08/2023 CLINICAL DATA:  Sepsis. EXAM: CHEST - 2 VIEW COMPARISON:  03/12/2022 FINDINGS: No consolidation, pneumothorax or effusion. No edema. Underinflation. Normal  cardiopericardial silhouette. Films are under penetrated. Fixation hardware along the lower cervical spine at the edge of the imaging field. Overlapping cardiac leads. Degenerative changes of the spine on lateral view. IMPRESSION: Underinflation.  No acute cardiopulmonary disease. Electronically Signed   By: Karen Kays M.D.   On: 04/08/2023 11:35    Procedures .Critical Care  Performed by: Olene Floss, PA-C Authorized by: Olene Floss, PA-C   Critical care provider statement:    Critical care time (minutes):  35   Critical care was necessary to treat or prevent imminent or life-threatening deterioration of the following conditions:  Sepsis   Critical care was time spent personally by me on the following activities:  Development of treatment plan with patient or surrogate, discussions with consultants, evaluation of patient's response to treatment, examination of patient, ordering and review of laboratory studies, ordering and review of radiographic studies, ordering and performing treatments and interventions, pulse oximetry, re-evaluation of patient's condition and review of old charts   Care  discussed with: admitting provider       Medications Ordered in ED Medications  0.9 % NaCl with KCl 20 mEq/ L  infusion ( Intravenous New Bag/Given 04/08/23 1436)  HYDROcodone-acetaminophen (NORCO/VICODIN) 5-325 MG per tablet 2 tablet (2 tablets Oral Incomplete 04/08/23 1440)  cefTRIAXone (ROCEPHIN) 1 g in sodium chloride 0.9 % 100 mL IVPB (has no administration in time range)  enoxaparin (LOVENOX) injection 40 mg (has no administration in time range)  acetaminophen (TYLENOL) tablet 650 mg (has no administration in time range)    Or  acetaminophen (TYLENOL) suppository 650 mg (has no administration in time range)  ondansetron (ZOFRAN) tablet 4 mg (has no administration in time range)    Or  ondansetron (ZOFRAN) injection 4 mg (has no administration in time range)  vitamin B-12 (CYANOCOBALAMIN) tablet 100 mcg (100 mcg Oral Given 04/08/23 1729)  clonazePAM (KLONOPIN) tablet 0.5 mg (has no administration in time range)  buprenorphine (SUBUTEX) SL tablet 2 mg (has no administration in time range)  FLUoxetine (PROZAC) capsule 40 mg (40 mg Oral Given 04/08/23 1728)  HYDROcodone-acetaminophen (NORCO) 10-325 MG per tablet 1 tablet (has no administration in time range)  magnesium gluconate (MAGONATE) tablet 500 mg (has no administration in time range)  pantoprazole (PROTONIX) EC tablet 40 mg (has no administration in time range)  rosuvastatin (CRESTOR) tablet 10 mg (has no administration in time range)  tiZANidine (ZANAFLEX) tablet 4 mg (has no administration in time range)  gabapentin (NEURONTIN) capsule 300 mg (300 mg Oral Given 04/08/23 1729)  acetaminophen (TYLENOL) tablet 1,000 mg (1,000 mg Oral Given 04/08/23 1005)  lactated ringers bolus 1,500 mL (0 mLs Intravenous Stopped 04/08/23 1213)  cefTRIAXone (ROCEPHIN) 1 g in sodium chloride 0.9 % 100 mL IVPB (0 g Intravenous Stopped 04/08/23 1047)  potassium chloride SA (KLOR-CON M) CR tablet 40 mEq (40 mEq Oral Given 04/08/23 1112)  iohexol (OMNIPAQUE) 300 MG/ML  solution 100 mL (85 mLs Intravenous Contrast Given 04/08/23 1155)  clonazePAM (KLONOPIN) tablet 0.5 mg (0.5 mg Oral Given 04/08/23 1635)  buprenorphine (SUBUTEX) SL tablet 2 mg (2 mg Sublingual Given 04/08/23 1635)  magnesium sulfate IVPB 2 g 50 mL (0 g Intravenous Stopped 04/08/23 1738)    ED Course/ Medical Decision Making/ A&P Clinical Course as of 04/08/23 1859  Wed Apr 08, 2023  1435 Patient actively withdrawing from norco 10-325, clonazepam, or subutex. Currently still with some confusion, not with medical capacity to leave at this time.  [CP]    Clinical  Course User Index [CP] Olene Floss, PA-C                                 Medical Decision Making Amount and/or Complexity of Data Reviewed Labs: ordered. Radiology: ordered. ECG/medicine tests: ordered.  Risk OTC drugs. Prescription drug management. Decision regarding hospitalization.   This patient is a 67 y.o. female who presents to the ED for concern of confusion, dysuria, hematuria, this involves an extensive number of treatment options, and is a complaint that carries with it a high risk of complications and morbidity. The emergent differential diagnosis prior to evaluation includes, but is not limited to,  uti, pyelonephritis, hematuria, sepsis, CVA, seizure, hypotension, sepsis, hypoglycemia, hypoxic encephalopathy, metabolic encephalopathy, polypharmacy, substance abuse, developing dementia or alzheimers, meningitis, encephalitis, hypertensive emergency, other systemic infection, acute alcohol intoxication, acute alcohol or other drug withdrawal or psychiatric manifestation vs other . This is not an exhaustive differential.   Past Medical History / Co-morbidities / Social History: Hypertension, seizures, chronic pain, CKD, GERD  Additional history: Chart reviewed. Pertinent results include: Reviewed lab work, imaging for patient's admission within the last year for acute metabolic encephalopathy secondary to UTI and  acute urinary retention at that time  Physical Exam: Physical exam performed. The pertinent findings include: Patient initially confused, only alert and oriented to self, with somewhat nonsensical speech, after Foley catheter and fluid rehydration patient somewhat improved, alert and oriented to self and time, as well as situation, but with somewhat nonsensical answers to questions at this time still.  Lab Tests: I ordered, and personally interpreted labs.  The pertinent results include: CBC notable for leukocytosis, white blood cells 14.2, no other significant abnormality.  Her UA with small leukocytes, trace protein and small hemoglobin, no bacteria seen, no white blood cell count, however remains suspicious for possible urinary source with no other evident sources at this time.  CMP notable for mild hyponatremia, sodium 133, potassium 3.0, elevated creatinine 1.08.  RVP negative for COVID, flu, RSV, initial lactic acid normal.   Imaging Studies: I ordered imaging studies including plain film chest x-ray, head CT, and CT chest abdomen pelvis to search for source of undifferentiated sepsis. I independently visualized and interpreted imaging which showed bladder thickening as well as stranding around kidneys is suspicious for pyelonephritis despite UA results, we will send for culture and treat presumptively, no other significant intrathoracic or intra-abdominal findings noted, no significant intracranial abnormality noted. I agree with the radiologist interpretation.   Cardiac Monitoring:  The patient was maintained on a cardiac monitor.  My attending physician Dr. Wallace Cullens viewed and interpreted the cardiac monitored which showed an underlying rhythm of: NSR. I agree with this interpretation.   Medications: I ordered medication including 30 cc/kg fluid bolus, Rocephin, Tylenol for fever, sepsis with suspected urinary source. Reevaluation of the patient after these medicines showed that the patient  improved.  Despite some improvement given her acute urinary retention as well as sepsis on arrival, as well as ongoing encephalopathy I do think the patient would benefit from admission.  Consultations Obtained: I requested consultation with the hospitalist, spoke with Dr. Robb Matar,  and discussed lab and imaging findings as well as pertinent plan - they recommend: Admit for sepsis secondary to UTI, acute encephalopathy secondary to sepsis   Disposition: After consideration of the diagnostic results and the patients response to treatment, I feel that patient would benefit from admission.   I  discussed this case with my attending physician Dr. Wallace Cullens who cosigned this note including patient's presenting symptoms, physical exam, and planned diagnostics and interventions. Attending physician stated agreement with plan or made changes to plan which were implemented.    Final Clinical Impression(s) / ED Diagnoses Final diagnoses:  Sepsis with encephalopathy without septic shock, due to unspecified organism [facility])    Rx / DC Orders ED Discharge Orders     None         Olene Floss, PA-C 04/08/23 1633    Olene Floss, PA-C 04/08/23 1859    Franne Forts, DO 04/14/23 0009

## 2023-04-08 NOTE — Progress Notes (Signed)
Plan of Care Note for accepted transfer   Patient: Stephanie Carrillo MRN: [id number]   DOA: 04/08/2023  Facility requesting transfer: [facility]. Requesting Provider: Edwin Dada, DO. Reason for transfer: AMS due to urosepsis. Facility course:   67 year old female with a past medical history of anxiety, depression, chronic pain syndrome, history of UTIs, history of sepsis secondary to UTI with altered mental status who presented to the emergency department with fever, tachycardia and confusion.  Workup shows evidence of recurrence of sepsis due to pyelonephritis.  She received, acetaminophen 1000 mg p.o., KCl 40 mEq p.o., IV fluids and ceftriaxone 1 g IVPB.  Lab work: Urine Culture [[id number]]   Collected: 04/08/23 1109   Updated: 04/08/23 1337   Specimen Source: Urine, Catheterized   Lactic acid, plasma [[id number]]   Collected: 04/08/23 1222   Updated: 04/08/23 1250   Specimen Type: Blood    Lactic Acid, Venous 0.9 mmol/L  Urinalysis, w/ Reflex to Culture (Infection Suspected) -Urine, Clean Catch [[id number]] (Abnormal)   Collected: 04/08/23 1109   Updated: 04/08/23 1121   Specimen Source: Urine, Catheterized    Specimen Source URINE, CATHETERIZED   Color, Urine YELLOW   APPearance CLEAR   Specific Gravity, Urine 1.005   pH 5.5   Glucose, UA NEGATIVE mg/dL   Hgb urine dipstick SMALL Abnormal    Bilirubin Urine NEGATIVE   Ketones, ur NEGATIVE mg/dL   Protein, ur TRACE Abnormal  mg/dL   Nitrite NEGATIVE   Leukocytes,Ua SMALL Abnormal    RBC / HPF 0-5 RBC/hpf   WBC, UA 0-5 WBC/hpf   Bacteria, UA NONE SEEN   Squamous Epithelial / HPF 0-5 /HPF  Resp panel by RT-PCR (RSV, Flu A&B, Covid) Anterior Nasal Swab [[id number]]   Collected: 04/08/23 1013   Updated: 04/08/23 1055   Specimen Source: Anterior Nasal Swab    SARS Coronavirus 2 by RT PCR NEGATIVE   Influenza A by PCR NEGATIVE   Influenza B by PCR NEGATIVE   Resp Syncytial Virus by PCR NEGATIVE  Lactic acid, plasma [[id number]]    Collected: 04/08/23 0949   Updated: 04/08/23 1033   Specimen Type: Blood   Specimen Source: Vein    Lactic Acid, Venous 0.8 mmol/L  Comprehensive metabolic panel [[id number]] (Abnormal)   Collected: 04/08/23 0949   Updated: 04/08/23 1033   Specimen Type: Blood   Specimen Source: Vein    Sodium 133 Low  mmol/L   Potassium 3.0 Low  mmol/L   Chloride 98 mmol/L   CO2 22 mmol/L   Glucose, Bld 113 High  mg/dL   BUN 17 mg/dL   Creatinine, Ser 7.56 High  mg/dL   Calcium 8.6 Low  mg/dL   Total Protein 8.3 High  g/dL   Albumin 4.3 g/dL   AST 15 U/L   ALT 10 U/L   Alkaline Phosphatase 108 U/L   Total Bilirubin 1.0 mg/dL   GFR, Estimated 56 Low  mL/min   Anion gap 13  Protime-INR [[id number]]   Collected: 04/08/23 0949   Updated: 04/08/23 1020   Specimen Type: Blood   Specimen Source: Vein    Prothrombin Time 14.9 seconds   INR 1.2  Culture, blood (Routine x 2) [[id number]]   Collected: 04/08/23 0949   Updated: 04/08/23 1015   Specimen Type: Blood   Specimen Source: Peripheral   CBC with Differential [[id number]] (Abnormal)   Collected: 04/08/23 0949   Updated: 04/08/23 1012   Specimen Type: Blood   Specimen Source: Vein    WBC 14.2 High  K/uL  RBC 4.13 MIL/uL   Hemoglobin 14.1 g/dL   HCT 40.9 %   MCV 81.1 fL   MCH 34.1 High  pg   MCHC 34.4 g/dL   RDW 91.4 %   Platelets 215 K/uL   nRBC 0.0 %   Neutrophils Relative % 81 %   Neutro Abs 11.5 High  K/uL   Lymphocytes Relative 5 %   Lymphs Abs 0.7 K/uL   Monocytes Relative 14 %   Monocytes Absolute 1.9 High  K/uL   Eosinophils Relative 0 %   Eosinophils Absolute 0.0 K/uL   Basophils Relative 0 %   Basophils Absolute 0.0 K/uL   Immature Granulocytes 0 %   Abs Immature Granulocytes 0.06 K/uL   Imaging: Underinflated, but negative chest radiograph.  CT head was negative.  CT abdomen/pelvis with contrast with signs of right pyelonephritis.  There was also sick referential bladder wall thickening.  Left basilar atelectasis  or scar.  Plan of care: The patient is accepted for admission to Progressive unit, at [facility].  Author: Bobette Mo, MD 04/08/2023  Check [email or web address] for on-call coverage.  Nursing staff, Please call [facility] Admits & Consults System-Wide number on Amion as soon as patient's arrival, so appropriate admitting provider can evaluate the pt.

## 2023-04-08 NOTE — ED Notes (Signed)
RT Note: Patient assessed due to heavy chest feeling

## 2023-04-08 NOTE — Progress Notes (Signed)
Pt had  one diarrhea, 5-6 vomiting, and low grade fever (100.9). IV Zofran given, but pt kept vomiting. Contacted attending hospitalist for other antiemetic. Compazine was given. Pt wanted to try PO Tylenol for fever after taking IV Zofran. However, pt vomited 10-15 min after taking Tylenol. No pill in the emesis. Will continue to monitor.

## 2023-04-09 ENCOUNTER — Inpatient Hospital Stay (HOSPITAL_COMMUNITY): Payer: Medicare Other

## 2023-04-09 DIAGNOSIS — N390 Urinary tract infection, site not specified: Secondary | ICD-10-CM

## 2023-04-09 DIAGNOSIS — R6520 Severe sepsis without septic shock: Secondary | ICD-10-CM

## 2023-04-09 DIAGNOSIS — G9341 Metabolic encephalopathy: Secondary | ICD-10-CM

## 2023-04-09 DIAGNOSIS — A419 Sepsis, unspecified organism: Secondary | ICD-10-CM | POA: Diagnosis not present

## 2023-04-09 DIAGNOSIS — F419 Anxiety disorder, unspecified: Secondary | ICD-10-CM

## 2023-04-09 DIAGNOSIS — E876 Hypokalemia: Secondary | ICD-10-CM | POA: Diagnosis not present

## 2023-04-09 DIAGNOSIS — F32A Depression, unspecified: Secondary | ICD-10-CM

## 2023-04-09 LAB — MAGNESIUM: Magnesium: 1.8 mg/dL (ref 1.7–2.4)

## 2023-04-09 LAB — CBC WITH DIFFERENTIAL/PLATELET
Abs Immature Granulocytes: 0.02 10*3/uL (ref 0.00–0.07)
Basophils Absolute: 0 10*3/uL (ref 0.0–0.1)
Basophils Relative: 0 %
Eosinophils Absolute: 0 10*3/uL (ref 0.0–0.5)
Eosinophils Relative: 0 %
HCT: 42.6 % (ref 36.0–46.0)
Hemoglobin: 14.1 g/dL (ref 12.0–15.0)
Immature Granulocytes: 0 %
Lymphocytes Relative: 5 %
Lymphs Abs: 0.5 10*3/uL — ABNORMAL LOW (ref 0.7–4.0)
MCH: 33.9 pg (ref 26.0–34.0)
MCHC: 33.1 g/dL (ref 30.0–36.0)
MCV: 102.4 fL — ABNORMAL HIGH (ref 80.0–100.0)
Monocytes Absolute: 0.5 10*3/uL (ref 0.1–1.0)
Monocytes Relative: 4 %
Neutro Abs: 10.3 10*3/uL — ABNORMAL HIGH (ref 1.7–7.7)
Neutrophils Relative %: 91 %
Platelets: 155 10*3/uL (ref 150–400)
RBC: 4.16 MIL/uL (ref 3.87–5.11)
RDW: 11.8 % (ref 11.5–15.5)
WBC: 11.3 10*3/uL — ABNORMAL HIGH (ref 4.0–10.5)
nRBC: 0 % (ref 0.0–0.2)

## 2023-04-09 LAB — BASIC METABOLIC PANEL
Anion gap: 14 (ref 5–15)
BUN: 9 mg/dL (ref 8–23)
CO2: 21 mmol/L — ABNORMAL LOW (ref 22–32)
Calcium: 8.8 mg/dL — ABNORMAL LOW (ref 8.9–10.3)
Chloride: 101 mmol/L (ref 98–111)
Creatinine, Ser: 0.67 mg/dL (ref 0.44–1.00)
GFR, Estimated: >60 mL/min (ref >60)
Glucose, Bld: 137 mg/dL — ABNORMAL HIGH (ref 70–99)
Potassium: 3.5 mmol/L (ref 3.5–5.1)
Sodium: 136 mmol/L (ref 135–145)

## 2023-04-09 LAB — URINE CULTURE: Culture: NO GROWTH

## 2023-04-09 LAB — HIV ANTIBODY (ROUTINE TESTING W REFLEX): HIV Screen 4th Generation wRfx: NONREACTIVE

## 2023-04-09 MED ORDER — POTASSIUM CHLORIDE CRYS ER 20 MEQ PO TBCR
40.0000 meq | EXTENDED_RELEASE_TABLET | Freq: Once | ORAL | Status: AC
  Administered 2023-04-09: 40 meq via ORAL
  Filled 2023-04-09: qty 2

## 2023-04-09 MED ORDER — SODIUM CHLORIDE 0.9 % IV SOLN
12.5000 mg | Freq: Once | INTRAVENOUS | Status: AC
  Administered 2023-04-09: 12.5 mg via INTRAVENOUS
  Filled 2023-04-09: qty 12.5

## 2023-04-09 MED ORDER — SODIUM CHLORIDE 0.9 % IV SOLN
12.5000 mg | Freq: Four times a day (QID) | INTRAVENOUS | Status: DC | PRN
  Filled 2023-04-09: qty 0.5

## 2023-04-09 MED ORDER — CHLORHEXIDINE GLUCONATE CLOTH 2 % EX PADS
6.0000 | MEDICATED_PAD | Freq: Every day | CUTANEOUS | Status: DC
  Administered 2023-04-09 – 2023-04-10 (×2): 6 via TOPICAL

## 2023-04-09 NOTE — Progress Notes (Signed)
Mobility Specialist - Progress Note   04/09/23 1353  Mobility  Activity Ambulated with assistance in hallway  Level of Assistance Standby assist, set-up cues, supervision of patient - no hands on  Assistive Device Front wheel walker  Distance Ambulated (ft) 160 ft  Activity Response Tolerated well  Mobility Referral Yes  $Mobility charge 1 Mobility  Mobility Specialist Start Time (ACUTE ONLY) 1340  Mobility Specialist Stop Time (ACUTE ONLY) 1357  Mobility Specialist Time Calculation (min) (ACUTE ONLY) 17 min   Pt received in bed and agreeable to mobility. No complaints during session. Pt to bed after session with all needs met. Bed alarm on.      [facility]

## 2023-04-09 NOTE — Plan of Care (Signed)
  Problem: Nutrition: Goal: Adequate nutrition will be maintained Outcome: Not Progressing   Problem: Coping: Goal: Level of anxiety will decrease Outcome: Progressing   Problem: Pain Managment: Goal: General experience of comfort will improve Outcome: Progressing   

## 2023-04-09 NOTE — TOC CM/SW Note (Signed)
Transition of Care [facility]) - Inpatient Brief Assessment   Patient Details  Name: Stephanie Carrillo MRN: [id number] Date of Birth: [birth year]  Transition of Care [facility]) CM/SW Contact:    Howell Rucks, RN Phone Number: 04/09/2023, 10:25 AM   Clinical Narrative: Met with pt at bedside to introduce role of TOC/NCM and review for dc planning. Pt reports she has a PCP and pharmacy in place, no current home care services, reports she has home DME she uses as needed. Pt reports she feels safe returning home with support from family/friends and neighbors. Pt reports she has transportation available at discharge. TOC Brief Assessment completed. No TOC needs identified.     Transition of Care Asessment: Insurance and Status: Insurance coverage has been reviewed Patient has primary care physician: Yes Home environment has been reviewed: home with support from friends/family/ neighbors Prior level of function:: Independent Prior/Current Home Services: No current home services Social Determinants of Health Reivew: SDOH reviewed no interventions necessary Readmission risk has been reviewed: Yes Transition of care needs: no transition of care needs at this time

## 2023-04-09 NOTE — Progress Notes (Signed)
   04/08/23 2336  Assess: MEWS Score  Temp (!) 100.6 F (38.1 C)  BP (!) 157/104  MAP (mmHg) 120  Pulse Rate (!) 102  Resp 20  SpO2 95 %  O2 Device Room Air  Assess: MEWS Score  MEWS Temp 1  MEWS Systolic 0  MEWS Pulse 1  MEWS RR 0  MEWS LOC 0  MEWS Score 2  MEWS Score Color Yellow  Assess: if the MEWS score is Yellow or Red  Were vital signs accurate and taken at a resting state? Yes  Does the patient meet 2 or more of the SIRS criteria? Yes  Does the patient have a confirmed or suspected source of infection? No  MEWS guidelines implemented  Yes, yellow  Treat  MEWS Interventions Considered administering scheduled or prn medications/treatments as ordered  Take Vital Signs  Increase Vital Sign Frequency  Yellow: Q2hr x1, continue Q4hrs until patient remains green for 12hrs  Escalate  MEWS: Escalate Yellow: Discuss with charge nurse and consider notifying provider and/or RRT  Notify: Charge Nurse/RN  Name of Charge Nurse/RN Notified Lyons, RN  Provider Notification  Provider Name/Title Anthoney Harada, NP  Date Provider Notified 04/08/23  Time Provider Notified 2350  Method of Notification Page (secure chat)  Notification Reason Change in status  Provider response No new orders  Date of Provider Response 04/08/23  Time of Provider Response 2352  Assess: SIRS CRITERIA  SIRS Temperature  0  SIRS Pulse 1  SIRS Respirations  0  SIRS WBC 0  SIRS Score Sum  1   Yellow MEWS started. CN and Larwance Rote NP made aware. Will continue to monitor.

## 2023-04-09 NOTE — Progress Notes (Addendum)
[facility] PROGRESS NOTE    Progress Note  Stephanie Carrillo  ZHY:[id number] DOB: 1956/01/06 DOA: 04/08/2023 PCP: Lavada Mesi, MD     Brief Narrative:   Stephanie Carrillo is an 67 y.o. female past medical history significant for anxiety, depression, hyperlipidemia, history of meningitis, history of seizures substance abuse recurrent UTI, with a history of E. coli sepsis bacteremia comes into the emergency room complaining of dysuria for several days altered mental status and fever.  White blood cell count of 14, hemoglobin of 14 platelet count of 200 lactic acid of 2, magnesium 1.5.  CT of the head showed no acute findings, CT scan of the abdomen and pelvis with contrast showed right-sided pyelonephritis and some bladder wall thickening.  Assessment/Plan:   Severe sepsis secondary to UTI [facility]): Empirically on IV Rocephin urine cultures and blood cultures were sent. She is defervescing, with a Tmax of 100.6. Use Tylenol for fevers. Blood pressure is stable tachycardia is resolving. Patient relates she feels better she could deal with this at home, with medication. Assess decision-making capacity based on the patient's ability to: To the reason for her going home and refusing treatment, she did not get a lot of sleep in the hospital.  And she has been most of the night throwing up. She can communicate that she wants to go home and she know the risk and benefits she does know that if the bacteria is in her blood and she is not treated with antibiotics this could contribute to her demise. She understand the proposed treatment and she understands that she needs IV antibiotics, but she relates she can take oral antibiotics at home. Stephanie Carrillo grasp the consequences of accepting or declining the suggested treatment of IV antibiotics which can lead to her demise. I have explained to her that I cannot discharge her as she requires at least 2 more days of IV antibiotics and we need to await blood  cultures.  She seems to understand this.  Acute metabolic encephalopathy Likely due to infectious etiology. Now resolved, she relates she has been all night vomiting and she could not sleep. She wants to leave she does have capacity and I have explained to her the risk and benefits see above for the details.  Anxiety and depression Resume her clonazepam tizanidine and fluoxetine. She seems very anxious this morning she relates she could not get any sleep, this could be causing her to wanting to leave AGAINST MEDICAL ADVICE advised the nurse to give her the medication this morning.  But the patient is extremely anxious this morning.  Chronic pain syndrome: Continue Norco and gabapentin.  Hypokalemia: Repleted, basic metabolic panels pending this morning.  Hypomagnesemia: Repleted IV and orally, magnesium is pending this morning.  Hyperlipidemia: Continue statins.  DVT prophylaxis: lovenox Family Communication:none Status is: Inpatient Remains inpatient appropriate because: Severe sepsis due to UTI    Code Status:     Code Status Orders  (From admission, onward)           Start     Ordered   04/08/23 1617  Full code  Continuous       Question:  By:  Answer:  Consent: discussion documented in EHR   04/08/23 1617           Code Status History     Date Active Date Inactive Code Status Order ID Comments User Context   03/13/2022 0055 03/15/2022 1755 Full Code [id number]  Anselm Jungling, DO ED   03/28/2021 1558  04/06/2021 0020 DNR [id number]  Joseph Art, DO ED   03/17/2021 2118 03/21/2021 1802 DNR [id number]  Eduard Clos, MD ED   10/05/2020 1111 10/07/2020 2147 DNR [id number]  Rodolph Bong, MD Inpatient   10/04/2020 1657 10/05/2020 1110 Full Code [id number]  Teddy Spike, DO Inpatient   02/23/2020 2030 02/24/2020 2133 Full Code [id number]  Verdene Lennert, MD ED   01/27/2020 1807 01/30/2020 2158 Full Code [id number]  Dellia Cloud, MD ED   09/22/2018 2136 09/25/2018 1723  Full Code [id number]  Hillary Bow, DO ED         IV Access:   Peripheral IV   Procedures and diagnostic studies:   DG Abd 1 View  Result Date: 04/09/2023 CLINICAL DATA:  [id number] Vomiting [id number] EXAM: ABDOMEN - 1 VIEW COMPARISON:  CT chest abdomen pelvis 04/08/2023, chest x-ray 04/08/2023, x-ray abdomen 03/18/2021 FINDINGS: Gaseous distension of the stomach. The bowel gas pattern is normal. No radio-opaque calculi or other significant radiographic abnormality are seen. IMPRESSION: Nonspecific gaseous distension of the stomach. Electronically Signed   By: Tish Frederickson M.D.   On: 04/09/2023 01:25   CT CHEST ABDOMEN PELVIS W CONTRAST  Result Date: 04/08/2023 CLINICAL DATA:  Sepsis Tech note: Pt arrived via [facility] c/o high fever; Pt w/ hx of UTI c/o burning with urination as well as minor hematuria. Pt febrile and tachycardic in triage Hx of CKD, substance abuse, back surgery EXAM: CT CHEST, ABDOMEN, AND PELVIS WITH CONTRAST TECHNIQUE: Multidetector CT imaging of the chest, abdomen and pelvis was performed following the standard protocol during bolus administration of intravenous contrast. RADIATION DOSE REDUCTION: This exam was performed according to the departmental dose-optimization program which includes automated exposure control, adjustment of the mA and/or kV according to patient size and/or use of iterative reconstruction technique. CONTRAST:  85mL OMNIPAQUE IOHEXOL 300 MG/ML  SOLN COMPARISON:  None Available. FINDINGS: CT CHEST FINDINGS Cardiovascular: No significant vascular findings. Normal heart size. No pericardial effusion. Mediastinum/Nodes: No enlarged mediastinal, hilar, or axillary lymph nodes. Thyroid gland, trachea, and esophagus demonstrate no significant findings. Lungs/Pleura: Linear left basilar opacities, likely atelectasis/scar. Otherwise, lungs are clear. No pleural effusions or pneumothorax. Musculoskeletal: 6 seconds no chest wall mass or suspicious bone lesions  identified. CT ABDOMEN PELVIS FINDINGS Hepatobiliary: No focal liver abnormality is seen. No gallstones, gallbladder wall thickening, or biliary dilatation. Pancreas: Unremarkable. No pancreatic ductal dilatation or surrounding inflammatory changes. Spleen: Normal in size without focal abnormality. Adrenals/Urinary Tract: Normal adrenal glands. mildly asymmetric right greater than left perinephric. A few areas of ill-defined hypoattenuation in the right kidney. No hydronephrosis. Circumferential bladder wall thickening. The bladder is decompressed with Foley catheter in place. Small amount of bladder gas is likely from instrumentation. Stomach/Bowel: Stomach is within normal limits. No evidence of bowel wall thickening, distention, or inflammatory changes. Moderate to large amount of stool in the distal colon and rectum. Vascular/Lymphatic: Aorta bi-iliac calcific atherosclerosis. Reproductive: Status post hysterectomy. No adnexal masses. Other: No significant CP abdominal wall hernia or abnormality. No abdominopelvic ascites. Musculoskeletal: Multilevel spine degenerative change with multilevel lumbar ankylosis. IMPRESSION: 1. A few areas of ill-defined hypoattenuation in the right kidney which are nonspecific but suspicious for early pyelonephritis given the reported clinical history and mildly asymmetric right greater than left perinephric stranding. A follow-up ultrasound after treatment is recommended to exclude a renal lesion. 2. Circumferential bladder wall thickening which could be due to bladder decompression or cystitis. 3. Left basilar atelectasis or scar. Electronically Signed   By: Gelene Mink  Barnett Applebaum M.D.   On: 04/08/2023 13:02   CT Head Wo Contrast  Result Date: 04/08/2023 CLINICAL DATA:  Headache, fever EXAM: CT HEAD WITHOUT CONTRAST TECHNIQUE: Contiguous axial images were obtained from the base of the skull through the vertex without intravenous contrast. RADIATION DOSE REDUCTION: This exam was  performed according to the departmental dose-optimization program which includes automated exposure control, adjustment of the mA and/or kV according to patient size and/or use of iterative reconstruction technique. COMPARISON:  Brain MRI 05/17/2022 FINDINGS: Brain: There is no acute intracranial hemorrhage, extra-axial fluid collection, or acute infarct Parenchymal volume is normal. The ventricles are normal in size. Gray-white differentiation is preserved The pituitary and suprasellar region are normal. There is no mass lesion. There is no mass effect or midline shift. Vascular: No hyperdense vessel or unexpected calcification. Skull: Normal. Negative for fracture or focal lesion. Sinuses/Orbits: The imaged paranasal sinuses are clear. The globes and orbits are unremarkable. Other: The mastoid air cells and middle ear cavities are clear. IMPRESSION: No acute intracranial pathology. Electronically Signed   By: Lesia Hausen M.D.   On: 04/08/2023 11:47   DG Chest 2 View  Result Date: 04/08/2023 CLINICAL DATA:  Sepsis. EXAM: CHEST - 2 VIEW COMPARISON:  03/12/2022 FINDINGS: No consolidation, pneumothorax or effusion. No edema. Underinflation. Normal cardiopericardial silhouette. Films are under penetrated. Fixation hardware along the lower cervical spine at the edge of the imaging field. Overlapping cardiac leads. Degenerative changes of the spine on lateral view. IMPRESSION: Underinflation.  No acute cardiopulmonary disease. Electronically Signed   By: Karen Kays M.D.   On: 04/08/2023 11:35     Medical Consultants:   None.   Subjective:    Stephanie Carrillo she relates she spent all night vomiting now this morning is better, she relates she can take antibiotics at home and she wants to leave.  Objective:    Vitals:   04/09/23 0108 04/09/23 0207 04/09/23 0226 04/09/23 0610  BP:  (!) 161/96  (!) 168/94  Pulse:  (!) 105  100  Resp:  20  18  Temp: 98.8 F (37.1 C) 99.1 F (37.3 C) 98.9 F (37.2 C)  98.6 F (37 C)  TempSrc: Oral Oral Oral Oral  SpO2:  95%  96%  Weight:      Height:       SpO2: 96 % O2 Flow Rate (L/min): 0 L/min   Intake/Output Summary (Last 24 hours) at 04/09/2023 0724 Last data filed at 04/09/2023 0865 Gross per 24 hour  Intake 1576.39 ml  Output 2250 ml  Net -673.61 ml   Filed Weights   04/08/23 0912  Weight: 70.3 kg    Exam: General exam: In no acute distress. Respiratory system: Good air movement and clear to auscultation. Cardiovascular system: S1 & S2 heard, RRR. No JVD.  Gastrointestinal system: Abdomen is nondistended, soft and nontender.  Extremities: No pedal edema. Skin: No rashes, lesions or ulcers Psychiatry: Judgment and insight appear to be intact mood and affect she is a little bit anxious.  Which I think is contributing to her wanting to go home.   Data Reviewed:    Labs: Basic Metabolic Panel: Recent Labs  Lab 04/08/23 0949  NA 133*  K 3.0*  CL 98  CO2 22  GLUCOSE 113*  BUN 17  CREATININE 1.08*  CALCIUM 8.6*  MG 1.5*  PHOS 3.1   GFR Estimated Creatinine Clearance: 47.3 mL/min (A) (by C-G formula based on SCr of 1.08 mg/dL (H)). Liver Function Tests: Recent  Labs  Lab 04/08/23 0949  AST 15  ALT 10  ALKPHOS 108  BILITOT 1.0  PROT 8.3*  ALBUMIN 4.3   No results for input(s): "LIPASE", "AMYLASE" in the last 168 hours. No results for input(s): "AMMONIA" in the last 168 hours. Coagulation profile Recent Labs  Lab 04/08/23 0949  INR 1.2   COVID-19 Labs  No results for input(s): "DDIMER", "FERRITIN", "LDH", "CRP" in the last 72 hours.  Lab Results  Component Value Date   SARSCOV2NAA NEGATIVE 04/08/2023   SARSCOV2NAA NEGATIVE 03/12/2022   SARSCOV2NAA NEGATIVE 10/11/2021   SARSCOV2NAA NEGATIVE 07/15/2021    CBC: Recent Labs  Lab 04/08/23 0949  WBC 14.2*  NEUTROABS 11.5*  HGB 14.1  HCT 41.0  MCV 99.3  PLT 215   Cardiac Enzymes: No results for input(s): "CKTOTAL", "CKMB", "CKMBINDEX", "TROPONINI"  in the last 168 hours. BNP (last 3 results) No results for input(s): "PROBNP" in the last 8760 hours. CBG: No results for input(s): "GLUCAP" in the last 168 hours. D-Dimer: No results for input(s): "DDIMER" in the last 72 hours. Hgb A1c: No results for input(s): "HGBA1C" in the last 72 hours. Lipid Profile: No results for input(s): "CHOL", "HDL", "LDLCALC", "TRIG", "CHOLHDL", "LDLDIRECT" in the last 72 hours. Thyroid function studies: No results for input(s): "TSH", "T4TOTAL", "T3FREE", "THYROIDAB" in the last 72 hours.  Invalid input(s): "FREET3" Anemia work up: No results for input(s): "VITAMINB12", "FOLATE", "FERRITIN", "TIBC", "IRON", "RETICCTPCT" in the last 72 hours. Sepsis Labs: Recent Labs  Lab 04/08/23 0949 04/08/23 1222  WBC 14.2*  --   LATICACIDVEN 0.8 0.9   Microbiology Recent Results (from the past 240 hour(s))  Culture, blood (Routine x 2)     Status: None (Preliminary result)   Collection Time: 04/08/23  9:49 AM   Specimen: BLOOD RIGHT FOREARM  Result Value Ref Range Status   Specimen Description   Final    BLOOD RIGHT FOREARM Performed at [facility], [address] [place], [place] [address]    Special Requests   Final    BOTTLES DRAWN AEROBIC AND ANAEROBIC Blood Culture adequate volume Performed at [facility], [address], [place], [place] [address]    Culture PENDING  Incomplete   Report Status PENDING  Incomplete  Resp panel by RT-PCR (RSV, Flu A&B, Covid) Anterior Nasal Swab     Status: None   Collection Time: 04/08/23 10:13 AM   Specimen: Anterior Nasal Swab  Result Value Ref Range Status   SARS Coronavirus 2 by RT PCR NEGATIVE NEGATIVE Final    Comment: (NOTE) SARS-CoV-2 target nucleic acids are NOT DETECTED.  The SARS-CoV-2 RNA is generally detectable in upper respiratory specimens during the acute phase of infection. The lowest concentration of SARS-CoV-2 viral copies this assay can detect is 138 copies/mL.  A negative result does not preclude SARS-Cov-2 infection and should not be used as the sole basis for treatment or other patient management decisions. A negative result may occur with  improper specimen collection/handling, submission of specimen other than nasopharyngeal swab, presence of viral mutation(s) within the areas targeted by this assay, and inadequate number of viral copies(<138 copies/mL). A negative result must be combined with clinical observations, patient history, and epidemiological information. The expected result is Negative.  Fact Sheet for Patients:  BloggerCourse.com  Fact Sheet for Healthcare Providers:  SeriousBroker.it  This test is no t yet approved or cleared by the [place] FDA and  has been authorized for detection and/or diagnosis of SARS-CoV-2 by FDA  under an Emergency Use Authorization (EUA). This EUA will remain  in effect (meaning this test can be used) for the duration of the COVID-19 declaration under Section 564(b)(1) of the Act, 21 U.S.C.section 360bbb-3(b)(1), unless the authorization is terminated  or revoked sooner.       Influenza A by PCR NEGATIVE NEGATIVE Final   Influenza B by PCR NEGATIVE NEGATIVE Final    Comment: (NOTE) The Xpert Xpress SARS-CoV-2/FLU/RSV plus assay is intended as an aid in the diagnosis of influenza from Nasopharyngeal swab specimens and should not be used as a sole basis for treatment. Nasal washings and aspirates are unacceptable for Xpert Xpress SARS-CoV-2/FLU/RSV testing.  Fact Sheet for Patients: BloggerCourse.com  Fact Sheet for Healthcare Providers: SeriousBroker.it  This test is not yet approved or cleared by the [place] FDA and has been authorized for detection and/or diagnosis of SARS-CoV-2 by FDA under an Emergency Use Authorization (EUA). This EUA will remain in effect (meaning this test  can be used) for the duration of the COVID-19 declaration under Section 564(b)(1) of the Act, 21 U.S.C. section 360bbb-3(b)(1), unless the authorization is terminated or revoked.     Resp Syncytial Virus by PCR NEGATIVE NEGATIVE Final    Comment: (NOTE) Fact Sheet for Patients: BloggerCourse.com  Fact Sheet for Healthcare Providers: SeriousBroker.it  This test is not yet approved or cleared by the [place] FDA and has been authorized for detection and/or diagnosis of SARS-CoV-2 by FDA under an Emergency Use Authorization (EUA). This EUA will remain in effect (meaning this test can be used) for the duration of the COVID-19 declaration under Section 564(b)(1) of the Act, 21 U.S.C. section 360bbb-3(b)(1), unless the authorization is terminated or revoked.  Performed at [facility], [address], [place], [place] [address]      Medications:    buprenorphine  2 mg Sublingual BID   enoxaparin (LOVENOX) injection  40 mg Subcutaneous Q24H   FLUoxetine  40 mg Oral Daily   gabapentin  300 mg Oral TID   HYDROcodone-acetaminophen  2 tablet Oral Once   magnesium gluconate  500 mg Oral Daily   pantoprazole  40 mg Oral Daily   rosuvastatin  10 mg Oral QHS   cyanocobalamin  100 mcg Oral Daily   Continuous Infusions:  cefTRIAXone (ROCEPHIN)  IV        LOS: 1 day   Stephanie Carrillo  [facility]  04/09/2023, 7:24 AM

## 2023-04-10 DIAGNOSIS — A419 Sepsis, unspecified organism: Secondary | ICD-10-CM | POA: Diagnosis not present

## 2023-04-10 DIAGNOSIS — G9341 Metabolic encephalopathy: Secondary | ICD-10-CM | POA: Diagnosis not present

## 2023-04-10 DIAGNOSIS — N390 Urinary tract infection, site not specified: Secondary | ICD-10-CM | POA: Diagnosis not present

## 2023-04-10 DIAGNOSIS — F419 Anxiety disorder, unspecified: Secondary | ICD-10-CM | POA: Diagnosis not present

## 2023-04-10 LAB — BASIC METABOLIC PANEL
Anion gap: 16 — ABNORMAL HIGH (ref 5–15)
BUN: 10 mg/dL (ref 8–23)
CO2: 19 mmol/L — ABNORMAL LOW (ref 22–32)
Calcium: 9.3 mg/dL (ref 8.9–10.3)
Chloride: 98 mmol/L (ref 98–111)
Creatinine, Ser: 0.77 mg/dL (ref 0.44–1.00)
GFR, Estimated: >60 mL/min (ref >60)
Glucose, Bld: 80 mg/dL (ref 70–99)
Potassium: 4.9 mmol/L (ref 3.5–5.1)
Sodium: 133 mmol/L — ABNORMAL LOW (ref 135–145)

## 2023-04-10 MED ORDER — METOPROLOL TARTRATE 50 MG PO TABS
100.0000 mg | ORAL_TABLET | Freq: Two times a day (BID) | ORAL | Status: DC
  Administered 2023-04-10 – 2023-04-11 (×3): 100 mg via ORAL
  Filled 2023-04-10 (×3): qty 2

## 2023-04-10 MED ORDER — GABAPENTIN 400 MG PO CAPS
800.0000 mg | ORAL_CAPSULE | Freq: Three times a day (TID) | ORAL | Status: DC
  Filled 2023-04-10: qty 2

## 2023-04-10 NOTE — Plan of Care (Signed)

## 2023-04-10 NOTE — Progress Notes (Signed)
Mobility Specialist - Progress Note   04/10/23 1345  Mobility  Activity Ambulated with assistance in hallway  Level of Assistance Standby assist, set-up cues, supervision of patient - no hands on  Assistive Device Front wheel walker  Distance Ambulated (ft) 200 ft  Range of Motion/Exercises Active  Activity Response Tolerated well  Mobility Referral Yes  $Mobility charge 1 Mobility  Mobility Specialist Start Time (ACUTE ONLY) 1320  Mobility Specialist Stop Time (ACUTE ONLY) 1345  Mobility Specialist Time Calculation (min) (ACUTE ONLY) 25 min   Pt was found in bed and agreeable to ambulate. C/o knees aching prior to session. At EOS returned to bed with all needs met. Call bell in reach.  Billey Chang Mobility Specialist

## 2023-04-10 NOTE — Progress Notes (Signed)
[facility] PROGRESS NOTE    Progress Note  Mihira Peyer  ZOX:[id number] DOB: June 14, 1956 DOA: 04/08/2023 PCP: Lavada Mesi, MD     Brief Narrative:   Lakshya Chhoeun is an 67 y.o. female past medical history significant for anxiety, depression, hyperlipidemia, history of meningitis, history of seizures substance abuse recurrent UTI, with a history of E. coli sepsis bacteremia comes into the emergency room complaining of dysuria for several days altered mental status and fever.  White blood cell count of 14, hemoglobin of 14 platelet count of 200 lactic acid of 2, magnesium 1.5.  CT of the head showed no acute findings, CT scan of the abdomen and pelvis with contrast showed right-sided pyelonephritis and some bladder wall thickening.  Assessment/Plan:   Severe sepsis secondary to UTI [facility]): Continue IV Rocephin blood cultures are remain negative till date. She has defervesced. Patient agreed to stay. Urine cultures have been negative at the time of this dictation. Blood pressure is stable tachycardia has resolved.  Acute metabolic encephalopathy Likely due to infectious etiology. Now resolved.  Anxiety and depression Continue clonazepam, tenacity and and fluoxetine. But the patient is extremely anxious this morning.  Chronic pain syndrome: Continue Norco and gabapentin.  Hypokalemia: Repleted now resolved.  Hypomagnesemia: Repleted now resolved.  Hyperlipidemia: Continue statins.  DVT prophylaxis: lovenox Family Communication:none Status is: Inpatient Remains inpatient appropriate because: Severe sepsis due to UTI    Code Status:     Code Status Orders  (From admission, onward)           Start     Ordered   04/08/23 1617  Full code  Continuous       Question:  By:  Answer:  Consent: discussion documented in EHR   04/08/23 1617           Code Status History     Date Active Date Inactive Code Status Order ID Comments User Context   03/13/2022  0055 03/15/2022 1755 Full Code [id number]  Anselm Jungling, DO ED   03/28/2021 1558 04/06/2021 0020 DNR [id number]  Joseph Art, DO ED   03/17/2021 2118 03/21/2021 1802 DNR [id number]  Eduard Clos, MD ED   10/05/2020 1111 10/07/2020 2147 DNR [id number]  Rodolph Bong, MD Inpatient   10/04/2020 1657 10/05/2020 1110 Full Code [id number]  Teddy Spike, DO Inpatient   02/23/2020 2030 02/24/2020 2133 Full Code [id number]  Verdene Lennert, MD ED   01/27/2020 1807 01/30/2020 2158 Full Code [id number]  Dellia Cloud, MD ED   09/22/2018 2136 09/25/2018 1723 Full Code [id number]  Hillary Bow, DO ED         IV Access:   Peripheral IV   Procedures and diagnostic studies:   DG Abd 1 View  Result Date: 04/09/2023 CLINICAL DATA:  [id number] Vomiting [id number] EXAM: ABDOMEN - 1 VIEW COMPARISON:  CT chest abdomen pelvis 04/08/2023, chest x-ray 04/08/2023, x-ray abdomen 03/18/2021 FINDINGS: Gaseous distension of the stomach. The bowel gas pattern is normal. No radio-opaque calculi or other significant radiographic abnormality are seen. IMPRESSION: Nonspecific gaseous distension of the stomach. Electronically Signed   By: Tish Frederickson M.D.   On: 04/09/2023 01:25   CT CHEST ABDOMEN PELVIS W CONTRAST  Result Date: 04/08/2023 CLINICAL DATA:  Sepsis Tech note: Pt arrived via [facility] c/o high fever; Pt w/ hx of UTI c/o burning with urination as well as minor hematuria. Pt febrile and tachycardic in triage Hx of CKD, substance abuse, back surgery EXAM: CT CHEST, ABDOMEN, AND PELVIS  WITH CONTRAST TECHNIQUE: Multidetector CT imaging of the chest, abdomen and pelvis was performed following the standard protocol during bolus administration of intravenous contrast. RADIATION DOSE REDUCTION: This exam was performed according to the departmental dose-optimization program which includes automated exposure control, adjustment of the mA and/or kV according to patient size and/or use of iterative reconstruction technique. CONTRAST:  85mL  OMNIPAQUE IOHEXOL 300 MG/ML  SOLN COMPARISON:  None Available. FINDINGS: CT CHEST FINDINGS Cardiovascular: No significant vascular findings. Normal heart size. No pericardial effusion. Mediastinum/Nodes: No enlarged mediastinal, hilar, or axillary lymph nodes. Thyroid gland, trachea, and esophagus demonstrate no significant findings. Lungs/Pleura: Linear left basilar opacities, likely atelectasis/scar. Otherwise, lungs are clear. No pleural effusions or pneumothorax. Musculoskeletal: 6 seconds no chest wall mass or suspicious bone lesions identified. CT ABDOMEN PELVIS FINDINGS Hepatobiliary: No focal liver abnormality is seen. No gallstones, gallbladder wall thickening, or biliary dilatation. Pancreas: Unremarkable. No pancreatic ductal dilatation or surrounding inflammatory changes. Spleen: Normal in size without focal abnormality. Adrenals/Urinary Tract: Normal adrenal glands. mildly asymmetric right greater than left perinephric. A few areas of ill-defined hypoattenuation in the right kidney. No hydronephrosis. Circumferential bladder wall thickening. The bladder is decompressed with Foley catheter in place. Small amount of bladder gas is likely from instrumentation. Stomach/Bowel: Stomach is within normal limits. No evidence of bowel wall thickening, distention, or inflammatory changes. Moderate to large amount of stool in the distal colon and rectum. Vascular/Lymphatic: Aorta bi-iliac calcific atherosclerosis. Reproductive: Status post hysterectomy. No adnexal masses. Other: No significant CP abdominal wall hernia or abnormality. No abdominopelvic ascites. Musculoskeletal: Multilevel spine degenerative change with multilevel lumbar ankylosis. IMPRESSION: 1. A few areas of ill-defined hypoattenuation in the right kidney which are nonspecific but suspicious for early pyelonephritis given the reported clinical history and mildly asymmetric right greater than left perinephric stranding. A follow-up ultrasound after  treatment is recommended to exclude a renal lesion. 2. Circumferential bladder wall thickening which could be due to bladder decompression or cystitis. 3. Left basilar atelectasis or scar. Electronically Signed   By: Feliberto Harts M.D.   On: 04/08/2023 13:02   CT Head Wo Contrast  Result Date: 04/08/2023 CLINICAL DATA:  Headache, fever EXAM: CT HEAD WITHOUT CONTRAST TECHNIQUE: Contiguous axial images were obtained from the base of the skull through the vertex without intravenous contrast. RADIATION DOSE REDUCTION: This exam was performed according to the departmental dose-optimization program which includes automated exposure control, adjustment of the mA and/or kV according to patient size and/or use of iterative reconstruction technique. COMPARISON:  Brain MRI 05/17/2022 FINDINGS: Brain: There is no acute intracranial hemorrhage, extra-axial fluid collection, or acute infarct Parenchymal volume is normal. The ventricles are normal in size. Gray-white differentiation is preserved The pituitary and suprasellar region are normal. There is no mass lesion. There is no mass effect or midline shift. Vascular: No hyperdense vessel or unexpected calcification. Skull: Normal. Negative for fracture or focal lesion. Sinuses/Orbits: The imaged paranasal sinuses are clear. The globes and orbits are unremarkable. Other: The mastoid air cells and middle ear cavities are clear. IMPRESSION: No acute intracranial pathology. Electronically Signed   By: Lesia Hausen M.D.   On: 04/08/2023 11:47   DG Chest 2 View  Result Date: 04/08/2023 CLINICAL DATA:  Sepsis. EXAM: CHEST - 2 VIEW COMPARISON:  03/12/2022 FINDINGS: No consolidation, pneumothorax or effusion. No edema. Underinflation. Normal cardiopericardial silhouette. Films are under penetrated. Fixation hardware along the lower cervical spine at the edge of the imaging field. Overlapping cardiac leads. Degenerative changes of the spine  on lateral view. IMPRESSION:  Underinflation.  No acute cardiopulmonary disease. Electronically Signed   By: Karen Kays M.D.   On: 04/08/2023 11:35     Medical Consultants:   None.   Subjective:    Nikkia Ruotolo feels today nausea is resolved tolerating her diet, was able to sleep all night.  Objective:    Vitals:   04/09/23 0908 04/09/23 1303 04/09/23 2109 04/10/23 0428  BP:  (!) 164/91 (!) 157/96 (!) 131/96  Pulse:  93 (!) 109 (!) 109  Resp:  20 18 20   Temp: 98.8 F (37.1 C) 98.2 F (36.8 C) 98.2 F (36.8 C) 98.2 F (36.8 C)  TempSrc: Oral Oral Oral Oral  SpO2:  94% 94% 93%  Weight:      Height:       SpO2: 93 % O2 Flow Rate (L/min): 0 L/min   Intake/Output Summary (Last 24 hours) at 04/10/2023 0824 Last data filed at 04/10/2023 0600 Gross per 24 hour  Intake 390.71 ml  Output 1500 ml  Net -1109.29 ml   Filed Weights   04/08/23 0912  Weight: 70.3 kg    Exam: General exam: In no acute distress. Respiratory system: Good air movement and clear to auscultation. Cardiovascular system: S1 & S2 heard, RRR. No JVD. Gastrointestinal system: Abdomen is nondistended, soft and nontender.  Extremities: No pedal edema. Skin: No rashes, lesions or ulcers Psychiatry: Judgement and insight appear normal. Mood & affect appropriate.   Data Reviewed:    Labs: Basic Metabolic Panel: Recent Labs  Lab 04/08/23 0949 04/09/23 0809 04/10/23 0418  NA 133* 136 133*  K 3.0* 3.5 4.9  CL 98 101 98  CO2 22 21* 19*  GLUCOSE 113* 137* 80  BUN 17 9 10   CREATININE 1.08* 0.67 0.77  CALCIUM 8.6* 8.8* 9.3  MG 1.5* 1.8  --   PHOS 3.1  --   --    GFR Estimated Creatinine Clearance: 63.9 mL/min (by C-G formula based on SCr of 0.77 mg/dL). Liver Function Tests: Recent Labs  Lab 04/08/23 0949  AST 15  ALT 10  ALKPHOS 108  BILITOT 1.0  PROT 8.3*  ALBUMIN 4.3   No results for input(s): "LIPASE", "AMYLASE" in the last 168 hours. No results for input(s): "AMMONIA" in the last 168 hours. Coagulation  profile Recent Labs  Lab 04/08/23 0949  INR 1.2   COVID-19 Labs  No results for input(s): "DDIMER", "FERRITIN", "LDH", "CRP" in the last 72 hours.  Lab Results  Component Value Date   SARSCOV2NAA NEGATIVE 04/08/2023   SARSCOV2NAA NEGATIVE 03/12/2022   SARSCOV2NAA NEGATIVE 10/11/2021   SARSCOV2NAA NEGATIVE 07/15/2021    CBC: Recent Labs  Lab 04/08/23 0949 04/09/23 0809  WBC 14.2* 11.3*  NEUTROABS 11.5* 10.3*  HGB 14.1 14.1  HCT 41.0 42.6  MCV 99.3 102.4*  PLT 215 155   Cardiac Enzymes: No results for input(s): "CKTOTAL", "CKMB", "CKMBINDEX", "TROPONINI" in the last 168 hours. BNP (last 3 results) No results for input(s): "PROBNP" in the last 8760 hours. CBG: No results for input(s): "GLUCAP" in the last 168 hours. D-Dimer: No results for input(s): "DDIMER" in the last 72 hours. Hgb A1c: No results for input(s): "HGBA1C" in the last 72 hours. Lipid Profile: No results for input(s): "CHOL", "HDL", "LDLCALC", "TRIG", "CHOLHDL", "LDLDIRECT" in the last 72 hours. Thyroid function studies: No results for input(s): "TSH", "T4TOTAL", "T3FREE", "THYROIDAB" in the last 72 hours.  Invalid input(s): "FREET3" Anemia work up: No results for input(s): "VITAMINB12", "FOLATE", "FERRITIN", "TIBC", "IRON", "  RETICCTPCT" in the last 72 hours. Sepsis Labs: Recent Labs  Lab 04/08/23 0949 04/08/23 1222 04/09/23 0809  WBC 14.2*  --  11.3*  LATICACIDVEN 0.8 0.9  --    Microbiology Recent Results (from the past 240 hour(s))  Culture, blood (Routine x 2)     Status: None (Preliminary result)   Collection Time: 04/08/23  9:49 AM   Specimen: BLOOD RIGHT FOREARM  Result Value Ref Range Status   Specimen Description   Final    BLOOD RIGHT FOREARM Performed at [facility], [address] [place], [place] [address]    Special Requests   Final    BOTTLES DRAWN AEROBIC AND ANAEROBIC Blood Culture adequate volume Performed at [facility], [address], [place], [place] [address]    Culture   Final    NO GROWTH < 24 HOURS Performed at [facility], [address], [place], [place] [address]    Report Status PENDING  Incomplete  Resp panel by RT-PCR (RSV, Flu A&B, Covid) Anterior Nasal Swab     Status: None   Collection Time: 04/08/23 10:13 AM   Specimen: Anterior Nasal Swab  Result Value Ref Range Status   SARS Coronavirus 2 by RT PCR NEGATIVE NEGATIVE Final    Comment: (NOTE) SARS-CoV-2 target nucleic acids are NOT DETECTED.  The SARS-CoV-2 RNA is generally detectable in upper respiratory specimens during the acute phase of infection. The lowest concentration of SARS-CoV-2 viral copies this assay can detect is 138 copies/mL. A negative result does not preclude SARS-Cov-2 infection and should not be used as the sole basis for treatment or other patient management decisions. A negative result may occur with  improper specimen collection/handling, submission of specimen other than nasopharyngeal swab, presence of viral mutation(s) within the areas targeted by this assay, and inadequate number of viral copies(<138 copies/mL). A negative result must be combined with clinical observations, patient history, and epidemiological information. The expected result is Negative.  Fact Sheet for Patients:  BloggerCourse.com  Fact Sheet for Healthcare Providers:  SeriousBroker.it  This test is no t yet approved or cleared by the [place] FDA and  has been authorized for detection and/or diagnosis of SARS-CoV-2 by FDA under an Emergency Use Authorization (EUA). This EUA will remain  in effect (meaning this test can be used) for the duration of the COVID-19 declaration under Section 564(b)(1) of the Act, 21 U.S.C.section 360bbb-3(b)(1), unless the authorization is terminated  or revoked sooner.       Influenza A by PCR NEGATIVE NEGATIVE Final   Influenza B by PCR NEGATIVE  NEGATIVE Final    Comment: (NOTE) The Xpert Xpress SARS-CoV-2/FLU/RSV plus assay is intended as an aid in the diagnosis of influenza from Nasopharyngeal swab specimens and should not be used as a sole basis for treatment. Nasal washings and aspirates are unacceptable for Xpert Xpress SARS-CoV-2/FLU/RSV testing.  Fact Sheet for Patients: BloggerCourse.com  Fact Sheet for Healthcare Providers: SeriousBroker.it  This test is not yet approved or cleared by the [place] FDA and has been authorized for detection and/or diagnosis of SARS-CoV-2 by FDA under an Emergency Use Authorization (EUA). This EUA will remain in effect (meaning this test can be used) for the duration of the COVID-19 declaration under Section 564(b)(1) of the Act, 21 U.S.C. section 360bbb-3(b)(1), unless the authorization is terminated or revoked.     Resp Syncytial Virus by PCR NEGATIVE NEGATIVE Final    Comment: (NOTE) Fact Sheet for Patients: BloggerCourse.com  Fact Sheet for Healthcare Providers: SeriousBroker.it  This test is not yet approved or cleared by the [place] FDA and has been authorized for detection and/or diagnosis of SARS-CoV-2 by FDA under an Emergency Use Authorization (EUA). This EUA will remain in effect (meaning this test can be used) for the duration of the COVID-19 declaration under Section 564(b)(1) of the Act, 21 U.S.C. section 360bbb-3(b)(1), unless the authorization is terminated or revoked.  Performed at [facility], [address], [place], [place] [address]   Urine Culture     Status: None   Collection Time: 04/08/23 11:09 AM   Specimen: Urine, Catheterized  Result Value Ref Range Status   Specimen Description   Final    URINE, CATHETERIZED Performed at [facility], [address], [place], [place] [address]    Special  Requests   Final    NONE Performed at [facility], [address] [place], [place] [address]    Culture   Final    NO GROWTH Performed at [facility], [address], [place], [place] [address]    Report Status 04/09/2023 FINAL  Final     Medications:    buprenorphine  2 mg Sublingual BID   Chlorhexidine Gluconate Cloth  6 each Topical Daily   enoxaparin (LOVENOX) injection  40 mg Subcutaneous Q24H   FLUoxetine  40 mg Oral Daily   gabapentin  300 mg Oral TID   HYDROcodone-acetaminophen  2 tablet Oral Once   magnesium gluconate  500 mg Oral Daily   pantoprazole  40 mg Oral Daily   rosuvastatin  10 mg Oral QHS   cyanocobalamin  100 mcg Oral Daily   Continuous Infusions:  cefTRIAXone (ROCEPHIN)  IV 1 g (04/09/23 1046)   promethazine (PHENERGAN) injection (IM or IVPB)        LOS: 2 days   Marinda Elk  [facility]  04/10/2023, 8:24 AM

## 2023-04-11 DIAGNOSIS — A419 Sepsis, unspecified organism: Secondary | ICD-10-CM | POA: Diagnosis not present

## 2023-04-11 DIAGNOSIS — F419 Anxiety disorder, unspecified: Secondary | ICD-10-CM | POA: Diagnosis not present

## 2023-04-11 DIAGNOSIS — R6520 Severe sepsis without septic shock: Secondary | ICD-10-CM | POA: Diagnosis not present

## 2023-04-11 DIAGNOSIS — N390 Urinary tract infection, site not specified: Secondary | ICD-10-CM | POA: Diagnosis not present

## 2023-04-11 MED ORDER — CEPHALEXIN 500 MG PO CAPS
500.0000 mg | ORAL_CAPSULE | Freq: Three times a day (TID) | ORAL | Status: DC
  Administered 2023-04-11: 500 mg via ORAL
  Filled 2023-04-11: qty 1

## 2023-04-11 MED ORDER — CEPHALEXIN 500 MG PO CAPS: 500.0000 mg | ORAL_CAPSULE | Freq: Three times a day (TID) | ORAL | 0 refills | Status: DC

## 2023-04-11 MED ORDER — ORAL CARE MOUTH RINSE: 15.0000 mL | OROMUCOSAL | Status: DC | PRN

## 2023-04-11 MED ORDER — CEPHALEXIN 500 MG PO CAPS: 500.0000 mg | ORAL_CAPSULE | Freq: Three times a day (TID) | ORAL | 0 refills | Status: AC

## 2023-04-11 NOTE — Plan of Care (Signed)

## 2023-04-11 NOTE — Plan of Care (Signed)

## 2023-04-11 NOTE — Discharge Summary (Signed)
Physician Discharge Summary  Stephanie Carrillo WUJ:[id number] DOB: 12-17-1955 DOA: 04/08/2023  PCP: Lavada Mesi, MD  Admit date: 04/08/2023 Discharge date: 04/11/2023  Admitted From: Home Disposition:  Home  Recommendations for Outpatient Follow-up:  Follow up with PCP in 1-2 weeks   Home Health:no Equipment/Devices:None  Discharge Condition:Stable CODE STATUS:Full Diet recommendation: Heart Healthy    Brief/Interim Summary: 67 y.o. female past medical history significant for anxiety, depression, hyperlipidemia, history of meningitis, history of seizures substance abuse recurrent UTI, with a history of E. coli sepsis bacteremia comes into the emergency room complaining of dysuria for several days altered mental status and fever.  White blood cell count of 14, hemoglobin of 14 platelet count of 200 lactic acid of 2, magnesium 1.5.  CT of the head showed no acute findings, CT scan of the abdomen and pelvis with contrast showed right-sided pyelonephritis and some bladder wall thickening.    Discharge Diagnoses:  Principal Problem:   Sepsis secondary to UTI [facility]) Active Problems:   Anxiety and depression   Chronic pain syndrome   Hypokalemia   Acute metabolic encephalopathy   Hypomagnesemia   Hyperlipidemia  Severe sepsis secondary to UTI: Start empirically on IV Rocephin blood cultures remain negative. She defervesced leukocytosis improved. Urine culture remain needed till date. She was changed to oral Keflex which she will continue as an outpatient.  Acute metabolic encephalopathy: Due to infectious etiology now resolved  Anxiety/depression:  No changes made to his medication.  Chronic pain syndrome: Continue current meds no changes made per  Hypokalemia: Repleted now resolved.  Hypomagnesemia: Repleted now resolved  Hyperlipidemia: Sign continue statins.  Discharge Instructions  Discharge Instructions     Diet - low sodium heart healthy   Complete by: As  directed    Increase activity slowly   Complete by: As directed       Allergies as of 04/11/2023       Reactions   Ciprofloxacin Shortness Of Breath   Respiratory arrest   Penicillins Shortness Of Breath, Rash   Did it involve swelling of the face/tongue/throat, SOB, or low BP? y Did it involve sudden or severe rash/hives, skin peeling, or any reaction on the inside of your mouth or nose? y Did you need to seek medical attention at a hospital or doctor's office? n When did it last happen?  1985 If all above answers are "NO", may proceed with cephalosporin use. Tolerated Ceftriaxone 10/05/20 - 10/07/20   Oxycodone Other (See Comments)   Patient in recovery process.        Medication List     TAKE these medications    acetaminophen 500 MG tablet Commonly known as: TYLENOL Take 2 tablets (1,000 mg total) by mouth every 6 (six) hours as needed.   buprenorphine 2 MG Subl SL tablet Commonly known as: SUBUTEX Place 2 mg under the tongue 2 (two) times daily.   Centrum Silver 50+Women Tabs Take 1 tablet by mouth daily.   cephALEXin 500 MG capsule Commonly known as: KEFLEX Take 1 capsule (500 mg total) by mouth every 8 (eight) hours for 5 days.   clonazePAM 0.5 MG tablet Commonly known as: KLONOPIN Take 1 tablet (0.5 mg total) by mouth 3 (three) times daily as needed for anxiety.   cyanocobalamin 100 MCG tablet Commonly known as: VITAMIN B12 Take 100 mcg by mouth daily.   diphenhydrAMINE 50 MG tablet Commonly known as: BENADRYL Take 150 mg by mouth at bedtime as needed.   FLUoxetine 40 MG capsule Commonly known  as: PROZAC Take 40 mg by mouth daily.   gabapentin 800 MG tablet Commonly known as: NEURONTIN Take 800 mg by mouth 3 (three) times daily.   HYDROcodone-acetaminophen 10-325 MG tablet Commonly known as: NORCO Take 1 tablet by mouth in the morning, at noon, in the evening, and at bedtime.   levothyroxine 50 MCG tablet Commonly known as: SYNTHROID Take 50  mcg by mouth daily.   lisinopril 10 MG tablet Commonly known as: ZESTRIL Take 10 mg by mouth as needed. Taking when blood pressure is 160's (abnormal will take)   magnesium gluconate 500 MG tablet Commonly known as: MAGONATE Take 500 mg by mouth daily.   metoprolol tartrate 100 MG tablet Commonly known as: LOPRESSOR Take 1 tablet (100 mg total) by mouth 2 (two) times daily.   pantoprazole 40 MG tablet Commonly known as: PROTONIX Take 1 tablet (40 mg total) by mouth daily at 6 (six) AM.   rosuvastatin 10 MG tablet Commonly known as: CRESTOR Take 10 mg by mouth at bedtime.   tiZANidine 4 MG tablet Commonly known as: ZANAFLEX Take 4 mg by mouth 3 (three) times daily as needed.        Allergies  Allergen Reactions   Ciprofloxacin Shortness Of Breath    Respiratory arrest   Penicillins Shortness Of Breath and Rash    Did it involve swelling of the face/tongue/throat, SOB, or low BP? y Did it involve sudden or severe rash/hives, skin peeling, or any reaction on the inside of your mouth or nose? y Did you need to seek medical attention at a hospital or doctor's office? n When did it last happen?  1985 If all above answers are "NO", may proceed with cephalosporin use.  Tolerated Ceftriaxone 10/05/20 - 10/07/20   Oxycodone Other (See Comments)    Patient in recovery process.    Consultations: None   Procedures/Studies: DG Abd 1 View  Result Date: 04/09/2023 CLINICAL DATA:  [id number] Vomiting [id number] EXAM: ABDOMEN - 1 VIEW COMPARISON:  CT chest abdomen pelvis 04/08/2023, chest x-ray 04/08/2023, x-ray abdomen 03/18/2021 FINDINGS: Gaseous distension of the stomach. The bowel gas pattern is normal. No radio-opaque calculi or other significant radiographic abnormality are seen. IMPRESSION: Nonspecific gaseous distension of the stomach. Electronically Signed   By: Tish Frederickson M.D.   On: 04/09/2023 01:25   CT CHEST ABDOMEN PELVIS W CONTRAST  Result Date: 04/08/2023 CLINICAL DATA:   Sepsis Tech note: Pt arrived via [facility] c/o high fever; Pt w/ hx of UTI c/o burning with urination as well as minor hematuria. Pt febrile and tachycardic in triage Hx of CKD, substance abuse, back surgery EXAM: CT CHEST, ABDOMEN, AND PELVIS WITH CONTRAST TECHNIQUE: Multidetector CT imaging of the chest, abdomen and pelvis was performed following the standard protocol during bolus administration of intravenous contrast. RADIATION DOSE REDUCTION: This exam was performed according to the departmental dose-optimization program which includes automated exposure control, adjustment of the mA and/or kV according to patient size and/or use of iterative reconstruction technique. CONTRAST:  85mL OMNIPAQUE IOHEXOL 300 MG/ML  SOLN COMPARISON:  None Available. FINDINGS: CT CHEST FINDINGS Cardiovascular: No significant vascular findings. Normal heart size. No pericardial effusion. Mediastinum/Nodes: No enlarged mediastinal, hilar, or axillary lymph nodes. Thyroid gland, trachea, and esophagus demonstrate no significant findings. Lungs/Pleura: Linear left basilar opacities, likely atelectasis/scar. Otherwise, lungs are clear. No pleural effusions or pneumothorax. Musculoskeletal: 6 seconds no chest wall mass or suspicious bone lesions identified. CT ABDOMEN PELVIS FINDINGS Hepatobiliary: No focal liver abnormality is seen. No  gallstones, gallbladder wall thickening, or biliary dilatation. Pancreas: Unremarkable. No pancreatic ductal dilatation or surrounding inflammatory changes. Spleen: Normal in size without focal abnormality. Adrenals/Urinary Tract: Normal adrenal glands. mildly asymmetric right greater than left perinephric. A few areas of ill-defined hypoattenuation in the right kidney. No hydronephrosis. Circumferential bladder wall thickening. The bladder is decompressed with Foley catheter in place. Small amount of bladder gas is likely from instrumentation. Stomach/Bowel: Stomach is within normal limits. No evidence of  bowel wall thickening, distention, or inflammatory changes. Moderate to large amount of stool in the distal colon and rectum. Vascular/Lymphatic: Aorta bi-iliac calcific atherosclerosis. Reproductive: Status post hysterectomy. No adnexal masses. Other: No significant CP abdominal wall hernia or abnormality. No abdominopelvic ascites. Musculoskeletal: Multilevel spine degenerative change with multilevel lumbar ankylosis. IMPRESSION: 1. A few areas of ill-defined hypoattenuation in the right kidney which are nonspecific but suspicious for early pyelonephritis given the reported clinical history and mildly asymmetric right greater than left perinephric stranding. A follow-up ultrasound after treatment is recommended to exclude a renal lesion. 2. Circumferential bladder wall thickening which could be due to bladder decompression or cystitis. 3. Left basilar atelectasis or scar. Electronically Signed   By: Feliberto Harts M.D.   On: 04/08/2023 13:02   CT Head Wo Contrast  Result Date: 04/08/2023 CLINICAL DATA:  Headache, fever EXAM: CT HEAD WITHOUT CONTRAST TECHNIQUE: Contiguous axial images were obtained from the base of the skull through the vertex without intravenous contrast. RADIATION DOSE REDUCTION: This exam was performed according to the departmental dose-optimization program which includes automated exposure control, adjustment of the mA and/or kV according to patient size and/or use of iterative reconstruction technique. COMPARISON:  Brain MRI 05/17/2022 FINDINGS: Brain: There is no acute intracranial hemorrhage, extra-axial fluid collection, or acute infarct Parenchymal volume is normal. The ventricles are normal in size. Gray-white differentiation is preserved The pituitary and suprasellar region are normal. There is no mass lesion. There is no mass effect or midline shift. Vascular: No hyperdense vessel or unexpected calcification. Skull: Normal. Negative for fracture or focal lesion. Sinuses/Orbits:  The imaged paranasal sinuses are clear. The globes and orbits are unremarkable. Other: The mastoid air cells and middle ear cavities are clear. IMPRESSION: No acute intracranial pathology. Electronically Signed   By: Lesia Hausen M.D.   On: 04/08/2023 11:47   DG Chest 2 View  Result Date: 04/08/2023 CLINICAL DATA:  Sepsis. EXAM: CHEST - 2 VIEW COMPARISON:  03/12/2022 FINDINGS: No consolidation, pneumothorax or effusion. No edema. Underinflation. Normal cardiopericardial silhouette. Films are under penetrated. Fixation hardware along the lower cervical spine at the edge of the imaging field. Overlapping cardiac leads. Degenerative changes of the spine on lateral view. IMPRESSION: Underinflation.  No acute cardiopulmonary disease. Electronically Signed   By: Karen Kays M.D.   On: 04/08/2023 11:35   (Echo, Carotid, EGD, Colonoscopy, ERCP)    Subjective: No complaints  Discharge Exam: Vitals:   04/10/23 2103 04/11/23 0422  BP: 122/78 117/65  Pulse: 86 70  Resp: 17 18  Temp: 98.3 F (36.8 C) 97.6 F (36.4 C)  SpO2: 94% 92%   Vitals:   04/10/23 0428 04/10/23 1218 04/10/23 2103 04/11/23 0422  BP: (!) 131/96 129/85 122/78 117/65  Pulse: (!) 109 99 86 70  Resp: 20 20 17 18   Temp: 98.2 F (36.8 C) 98.2 F (36.8 C) 98.3 F (36.8 C) 97.6 F (36.4 C)  TempSrc: Oral Oral Oral Oral  SpO2: 93% 91% 94% 92%  Weight:      Height:  General: Pt is alert, awake, not in acute distress Cardiovascular: RRR, S1/S2 +, no rubs, no gallops Respiratory: CTA bilaterally, no wheezing, no rhonchi Abdominal: Soft, NT, ND, bowel sounds + Extremities: no edema, no cyanosis    The results of significant diagnostics from this hospitalization (including imaging, microbiology, ancillary and laboratory) are listed below for reference.     Microbiology: Recent Results (from the past 240 hour(s))  Culture, blood (Routine x 2)     Status: None (Preliminary result)   Collection Time: 04/08/23  9:49 AM    Specimen: BLOOD RIGHT FOREARM  Result Value Ref Range Status   Specimen Description   Final    BLOOD RIGHT FOREARM Performed at [facility], [address], [place], [place] [address]    Special Requests   Final    BOTTLES DRAWN AEROBIC AND ANAEROBIC Blood Culture adequate volume Performed at [facility], [address] [place], [place] [address]    Culture   Final    NO GROWTH 3 DAYS Performed at [facility], [address], [place], [place] [address]    Report Status PENDING  Incomplete  Resp panel by RT-PCR (RSV, Flu A&B, Covid) Anterior Nasal Swab     Status: None   Collection Time: 04/08/23 10:13 AM   Specimen: Anterior Nasal Swab  Result Value Ref Range Status   SARS Coronavirus 2 by RT PCR NEGATIVE NEGATIVE Final    Comment: (NOTE) SARS-CoV-2 target nucleic acids are NOT DETECTED.  The SARS-CoV-2 RNA is generally detectable in upper respiratory specimens during the acute phase of infection. The lowest concentration of SARS-CoV-2 viral copies this assay can detect is 138 copies/mL. A negative result does not preclude SARS-Cov-2 infection and should not be used as the sole basis for treatment or other patient management decisions. A negative result may occur with  improper specimen collection/handling, submission of specimen other than nasopharyngeal swab, presence of viral mutation(s) within the areas targeted by this assay, and inadequate number of viral copies(<138 copies/mL). A negative result must be combined with clinical observations, patient history, and epidemiological information. The expected result is Negative.  Fact Sheet for Patients:  BloggerCourse.com  Fact Sheet for Healthcare Providers:  SeriousBroker.it  This test is no t yet approved or cleared by the [place] FDA and  has been authorized for detection and/or diagnosis of SARS-CoV-2 by FDA under an  Emergency Use Authorization (EUA). This EUA will remain  in effect (meaning this test can be used) for the duration of the COVID-19 declaration under Section 564(b)(1) of the Act, 21 U.S.C.section 360bbb-3(b)(1), unless the authorization is terminated  or revoked sooner.       Influenza A by PCR NEGATIVE NEGATIVE Final   Influenza B by PCR NEGATIVE NEGATIVE Final    Comment: (NOTE) The Xpert Xpress SARS-CoV-2/FLU/RSV plus assay is intended as an aid in the diagnosis of influenza from Nasopharyngeal swab specimens and should not be used as a sole basis for treatment. Nasal washings and aspirates are unacceptable for Xpert Xpress SARS-CoV-2/FLU/RSV testing.  Fact Sheet for Patients: BloggerCourse.com  Fact Sheet for Healthcare Providers: SeriousBroker.it  This test is not yet approved or cleared by the [place] FDA and has been authorized for detection and/or diagnosis of SARS-CoV-2 by FDA under an Emergency Use Authorization (EUA). This EUA will remain in effect (meaning this test can be used) for the duration of the COVID-19 declaration under Section 564(b)(1) of the Act, 21 U.S.C. section 360bbb-3(b)(1), unless the  authorization is terminated or revoked.     Resp Syncytial Virus by PCR NEGATIVE NEGATIVE Final    Comment: (NOTE) Fact Sheet for Patients: BloggerCourse.com  Fact Sheet for Healthcare Providers: SeriousBroker.it  This test is not yet approved or cleared by the [place] FDA and has been authorized for detection and/or diagnosis of SARS-CoV-2 by FDA under an Emergency Use Authorization (EUA). This EUA will remain in effect (meaning this test can be used) for the duration of the COVID-19 declaration under Section 564(b)(1) of the Act, 21 U.S.C. section 360bbb-3(b)(1), unless the authorization is terminated or revoked.  Performed at [facility], [address] [place], [place] [address]   Urine Culture     Status: None   Collection Time: 04/08/23 11:09 AM   Specimen: Urine, Catheterized  Result Value Ref Range Status   Specimen Description   Final    URINE, CATHETERIZED Performed at [facility], [address] [place], [place] [address]    Special Requests   Final    NONE Performed at [facility], [address], [place], [place] [address]    Culture   Final    NO GROWTH Performed at [facility], [address], [place], [place] [address]    Report Status 04/09/2023 FINAL  Final     Labs: BNP (last 3 results) No results for input(s): "BNP" in the last 8760 hours. Basic Metabolic Panel: Recent Labs  Lab 04/08/23 0949 04/09/23 0809 04/10/23 0418  NA 133* 136 133*  K 3.0* 3.5 4.9  CL 98 101 98  CO2 22 21* 19*  GLUCOSE 113* 137* 80  BUN 17 9 10   CREATININE 1.08* 0.67 0.77  CALCIUM 8.6* 8.8* 9.3  MG 1.5* 1.8  --   PHOS 3.1  --   --    Liver Function Tests: Recent Labs  Lab 04/08/23 0949  AST 15  ALT 10  ALKPHOS 108  BILITOT 1.0  PROT 8.3*  ALBUMIN 4.3   No results for input(s): "LIPASE", "AMYLASE" in the last 168 hours. No results for input(s): "AMMONIA" in the last 168 hours. CBC: Recent Labs  Lab 04/08/23 0949 04/09/23 0809  WBC 14.2* 11.3*  NEUTROABS 11.5* 10.3*  HGB 14.1 14.1  HCT 41.0 42.6  MCV 99.3 102.4*  PLT 215 155   Cardiac Enzymes: No results for input(s): "CKTOTAL", "CKMB", "CKMBINDEX", "TROPONINI" in the last 168 hours. BNP: Invalid input(s): "POCBNP" CBG: No results for input(s): "GLUCAP" in the last 168 hours. D-Dimer No results for input(s): "DDIMER" in the last 72 hours. Hgb A1c No results for input(s): "HGBA1C" in the last 72 hours. Lipid Profile No results for input(s): "CHOL", "HDL", "LDLCALC", "TRIG", "CHOLHDL", "LDLDIRECT" in the last 72 hours. Thyroid function studies No results for input(s):  "TSH", "T4TOTAL", "T3FREE", "THYROIDAB" in the last 72 hours.  Invalid input(s): "FREET3" Anemia work up No results for input(s): "VITAMINB12", "FOLATE", "FERRITIN", "TIBC", "IRON", "RETICCTPCT" in the last 72 hours. Urinalysis    Component Value Date/Time   COLORURINE YELLOW 04/08/2023 1109   APPEARANCEUR CLEAR 04/08/2023 1109   LABSPEC 1.005 04/08/2023 1109   PHURINE 5.5 04/08/2023 1109   GLUCOSEU NEGATIVE 04/08/2023 1109   HGBUR SMALL (A) 04/08/2023 1109   BILIRUBINUR NEGATIVE 04/08/2023 1109   KETONESUR NEGATIVE 04/08/2023 1109   PROTEINUR TRACE (A) 04/08/2023 1109   NITRITE NEGATIVE 04/08/2023 1109   LEUKOCYTESUR SMALL (A) 04/08/2023 1109   Sepsis Labs Recent Labs  Lab 04/08/23 0949 04/09/23 0809  WBC  14.2* 11.3*   Microbiology Recent Results (from the past 240 hour(s))  Culture, blood (Routine x 2)     Status: None (Preliminary result)   Collection Time: 04/08/23  9:49 AM   Specimen: BLOOD RIGHT FOREARM  Result Value Ref Range Status   Specimen Description   Final    BLOOD RIGHT FOREARM Performed at [facility], [address] [place], [place] [address]    Special Requests   Final    BOTTLES DRAWN AEROBIC AND ANAEROBIC Blood Culture adequate volume Performed at [facility], [address], [place], [place] [address]    Culture   Final    NO GROWTH 3 DAYS Performed at [facility], [address], [place], [place] [address]    Report Status PENDING  Incomplete  Resp panel by RT-PCR (RSV, Flu A&B, Covid) Anterior Nasal Swab     Status: None   Collection Time: 04/08/23 10:13 AM   Specimen: Anterior Nasal Swab  Result Value Ref Range Status   SARS Coronavirus 2 by RT PCR NEGATIVE NEGATIVE Final    Comment: (NOTE) SARS-CoV-2 target nucleic acids are NOT DETECTED.  The SARS-CoV-2 RNA is generally detectable in upper respiratory specimens during the acute phase of infection. The lowest concentration of SARS-CoV-2 viral copies  this assay can detect is 138 copies/mL. A negative result does not preclude SARS-Cov-2 infection and should not be used as the sole basis for treatment or other patient management decisions. A negative result may occur with  improper specimen collection/handling, submission of specimen other than nasopharyngeal swab, presence of viral mutation(s) within the areas targeted by this assay, and inadequate number of viral copies(<138 copies/mL). A negative result must be combined with clinical observations, patient history, and epidemiological information. The expected result is Negative.  Fact Sheet for Patients:  BloggerCourse.com  Fact Sheet for Healthcare Providers:  SeriousBroker.it  This test is no t yet approved or cleared by the [place] FDA and  has been authorized for detection and/or diagnosis of SARS-CoV-2 by FDA under an Emergency Use Authorization (EUA). This EUA will remain  in effect (meaning this test can be used) for the duration of the COVID-19 declaration under Section 564(b)(1) of the Act, 21 U.S.C.section 360bbb-3(b)(1), unless the authorization is terminated  or revoked sooner.       Influenza A by PCR NEGATIVE NEGATIVE Final   Influenza B by PCR NEGATIVE NEGATIVE Final    Comment: (NOTE) The Xpert Xpress SARS-CoV-2/FLU/RSV plus assay is intended as an aid in the diagnosis of influenza from Nasopharyngeal swab specimens and should not be used as a sole basis for treatment. Nasal washings and aspirates are unacceptable for Xpert Xpress SARS-CoV-2/FLU/RSV testing.  Fact Sheet for Patients: BloggerCourse.com  Fact Sheet for Healthcare Providers: SeriousBroker.it  This test is not yet approved or cleared by the [place] FDA and has been authorized for detection and/or diagnosis of SARS-CoV-2 by FDA under an Emergency Use Authorization (EUA). This EUA  will remain in effect (meaning this test can be used) for the duration of the COVID-19 declaration under Section 564(b)(1) of the Act, 21 U.S.C. section 360bbb-3(b)(1), unless the authorization is terminated or revoked.     Resp Syncytial Virus by PCR NEGATIVE NEGATIVE Final    Comment: (NOTE) Fact Sheet for Patients: BloggerCourse.com  Fact Sheet for Healthcare Providers: SeriousBroker.it  This test is not yet approved or cleared by the [place] FDA and has been authorized for detection and/or diagnosis of SARS-CoV-2  by FDA under an Emergency Use Authorization (EUA). This EUA will remain in effect (meaning this test can be used) for the duration of the COVID-19 declaration under Section 564(b)(1) of the Act, 21 U.S.C. section 360bbb-3(b)(1), unless the authorization is terminated or revoked.  Performed at [facility], [address], [place], [place] [address]   Urine Culture     Status: None   Collection Time: 04/08/23 11:09 AM   Specimen: Urine, Catheterized  Result Value Ref Range Status   Specimen Description   Final    URINE, CATHETERIZED Performed at [facility], [address] [place], [place] [address]    Special Requests   Final    NONE Performed at [facility], [address] [place], [place] [address]    Culture   Final    NO GROWTH Performed at [facility], [address] [place], [place] [address]    Report Status 04/09/2023 FINAL  Final     Time coordinating discharge: Over 30 minutes  SIGNED:   Marinda Elk, MD  [facility] 04/11/2023, 10:07 AM Pager   If 7PM-7AM, please contact night-coverage [email or web address] Password [email or web address]

## 2023-04-11 NOTE — Progress Notes (Signed)
Mobility Specialist - Progress Note   04/11/23 1153  Mobility  Activity Ambulated with assistance in hallway  Level of Assistance Standby assist, set-up cues, supervision of patient - no hands on  Assistive Device Front wheel walker  Distance Ambulated (ft) 350 ft  Range of Motion/Exercises Active  Activity Response Tolerated well  Mobility Referral Yes  $Mobility charge 1 Mobility  Mobility Specialist Start Time (ACUTE ONLY) 1140  Mobility Specialist Stop Time (ACUTE ONLY) 1153  Mobility Specialist Time Calculation (min) (ACUTE ONLY) 13 min   Pt was found in bed and agreeable to ambulate. No complaints with session. At EOS returned to bed with all needs met. Call bell in reach. Friend in room.  Billey Chang Mobility Specialist

## 2023-04-13 LAB — CULTURE, BLOOD (ROUTINE X 2)
Culture: NO GROWTH
Special Requests: ADEQUATE

## 2023-07-01 ENCOUNTER — Other Ambulatory Visit (HOSPITAL_BASED_OUTPATIENT_CLINIC_OR_DEPARTMENT_OTHER): Payer: Self-pay

## 2023-07-01 ENCOUNTER — Emergency Department (HOSPITAL_BASED_OUTPATIENT_CLINIC_OR_DEPARTMENT_OTHER): Payer: Medicare Other

## 2023-07-01 ENCOUNTER — Emergency Department (HOSPITAL_BASED_OUTPATIENT_CLINIC_OR_DEPARTMENT_OTHER)
Admission: EM | Admit: 2023-07-01 | Discharge: 2023-07-01 | Discharge status: Against Medical Advice | Payer: Medicare Other | Attending: Emergency Medicine | Admitting: Emergency Medicine

## 2023-07-01 ENCOUNTER — Other Ambulatory Visit: Payer: Self-pay

## 2023-07-01 DIAGNOSIS — I491 Atrial premature depolarization: Secondary | ICD-10-CM | POA: Diagnosis not present

## 2023-07-01 DIAGNOSIS — Z1152 Encounter for screening for COVID-19: Secondary | ICD-10-CM | POA: Insufficient documentation

## 2023-07-01 DIAGNOSIS — N189 Chronic kidney disease, unspecified: Secondary | ICD-10-CM | POA: Diagnosis not present

## 2023-07-01 DIAGNOSIS — E871 Hypo-osmolality and hyponatremia: Secondary | ICD-10-CM | POA: Insufficient documentation

## 2023-07-01 DIAGNOSIS — Z79899 Other long term (current) drug therapy: Secondary | ICD-10-CM | POA: Insufficient documentation

## 2023-07-01 DIAGNOSIS — Z5329 Procedure and treatment not carried out because of patient's decision for other reasons: Secondary | ICD-10-CM | POA: Insufficient documentation

## 2023-07-01 DIAGNOSIS — D72829 Elevated white blood cell count, unspecified: Secondary | ICD-10-CM | POA: Diagnosis not present

## 2023-07-01 DIAGNOSIS — I129 Hypertensive chronic kidney disease with stage 1 through stage 4 chronic kidney disease, or unspecified chronic kidney disease: Secondary | ICD-10-CM | POA: Diagnosis not present

## 2023-07-01 DIAGNOSIS — A419 Sepsis, unspecified organism: Secondary | ICD-10-CM | POA: Insufficient documentation

## 2023-07-01 DIAGNOSIS — N12 Tubulo-interstitial nephritis, not specified as acute or chronic: Secondary | ICD-10-CM | POA: Insufficient documentation

## 2023-07-01 DIAGNOSIS — R112 Nausea with vomiting, unspecified: Secondary | ICD-10-CM | POA: Diagnosis present

## 2023-07-01 LAB — CBC WITH DIFFERENTIAL/PLATELET
Abs Immature Granulocytes: 0.05 10*3/uL (ref 0.00–0.07)
Basophils Absolute: 0 10*3/uL (ref 0.0–0.1)
Basophils Relative: 0 %
Eosinophils Absolute: 0 10*3/uL (ref 0.0–0.5)
Eosinophils Relative: 0 %
HCT: 41.6 % (ref 36.0–46.0)
Hemoglobin: 14.7 g/dL (ref 12.0–15.0)
Immature Granulocytes: 0 %
Lymphocytes Relative: 15 %
Lymphs Abs: 1.9 10*3/uL (ref 0.7–4.0)
MCH: 32.5 pg (ref 26.0–34.0)
MCHC: 35.3 g/dL (ref 30.0–36.0)
MCV: 92 fL (ref 80.0–100.0)
Monocytes Absolute: 1.8 10*3/uL — ABNORMAL HIGH (ref 0.1–1.0)
Monocytes Relative: 14 %
Neutro Abs: 8.9 10*3/uL — ABNORMAL HIGH (ref 1.7–7.7)
Neutrophils Relative %: 71 %
Platelets: 323 10*3/uL (ref 150–400)
RBC: 4.52 MIL/uL (ref 3.87–5.11)
RDW: 13.2 % (ref 11.5–15.5)
WBC: 13.2 10*3/uL — ABNORMAL HIGH (ref 4.0–10.5)
nRBC: 0 % (ref 0.0–0.2)

## 2023-07-01 LAB — COMPREHENSIVE METABOLIC PANEL
ALT: 34 U/L (ref 0–44)
AST: 50 U/L — ABNORMAL HIGH (ref 15–41)
Albumin: 4.3 g/dL (ref 3.5–5.0)
Alkaline Phosphatase: 99 U/L (ref 38–126)
Anion gap: 16 — ABNORMAL HIGH (ref 5–15)
BUN: 29 mg/dL — ABNORMAL HIGH (ref 8–23)
CO2: 24 mmol/L (ref 22–32)
Calcium: 9.9 mg/dL (ref 8.9–10.3)
Chloride: 92 mmol/L — ABNORMAL LOW (ref 98–111)
Creatinine, Ser: 0.94 mg/dL (ref 0.44–1.00)
GFR, Estimated: >60 mL/min (ref >60)
Glucose, Bld: 92 mg/dL (ref 70–99)
Potassium: 3.2 mmol/L — ABNORMAL LOW (ref 3.5–5.1)
Sodium: 132 mmol/L — ABNORMAL LOW (ref 135–145)
Total Bilirubin: 1.7 mg/dL — ABNORMAL HIGH (ref <1.2)
Total Protein: 8.3 g/dL — ABNORMAL HIGH (ref 6.5–8.1)

## 2023-07-01 LAB — URINALYSIS, W/ REFLEX TO CULTURE (INFECTION SUSPECTED)
Bacteria, UA: NONE SEEN
Bilirubin Urine: NEGATIVE
Glucose, UA: NEGATIVE mg/dL
Ketones, ur: 15 mg/dL — AB
Nitrite: POSITIVE — AB
Specific Gravity, Urine: 1.023 (ref 1.005–1.030)
Trans Epithel, UA: <1
WBC, UA: >50 WBC/hpf (ref 0–5)
pH: 8 (ref 5.0–8.0)

## 2023-07-01 LAB — RESP PANEL BY RT-PCR (RSV, FLU A&B, COVID)  RVPGX2
Influenza A by PCR: NEGATIVE
Influenza B by PCR: NEGATIVE
Resp Syncytial Virus by PCR: NEGATIVE
SARS Coronavirus 2 by RT PCR: NEGATIVE

## 2023-07-01 LAB — LIPASE, BLOOD: Lipase: 43 U/L (ref 11–51)

## 2023-07-01 LAB — LACTIC ACID, PLASMA: Lactic Acid, Venous: 0.8 mmol/L (ref 0.5–1.9)

## 2023-07-01 LAB — MAGNESIUM: Magnesium: 1.7 mg/dL (ref 1.7–2.4)

## 2023-07-01 LAB — TROPONIN I (HIGH SENSITIVITY)
Troponin I (High Sensitivity): 59 ng/L — ABNORMAL HIGH (ref <18)
Troponin I (High Sensitivity): 62 ng/L — ABNORMAL HIGH (ref <18)

## 2023-07-01 LAB — D-DIMER, QUANTITATIVE: D-Dimer, Quant: 0.3 ug{FEU}/mL (ref 0.00–0.50)

## 2023-07-01 MED ORDER — ONDANSETRON 4 MG PO TBDP
4.0000 mg | ORAL_TABLET | Freq: Three times a day (TID) | ORAL | 0 refills | Status: DC | PRN
  Filled 2023-07-01 (×2): qty 20, 7d supply, fill #0

## 2023-07-01 MED ORDER — LACTATED RINGERS IV SOLN: INTRAVENOUS | Status: DC

## 2023-07-01 MED ORDER — METOCLOPRAMIDE HCL 5 MG/ML IJ SOLN
10.0000 mg | Freq: Once | INTRAMUSCULAR | Status: AC
  Administered 2023-07-01: 10 mg via INTRAVENOUS
  Filled 2023-07-01: qty 2

## 2023-07-01 MED ORDER — POTASSIUM CHLORIDE CRYS ER 20 MEQ PO TBCR
40.0000 meq | EXTENDED_RELEASE_TABLET | Freq: Once | ORAL | Status: AC
  Administered 2023-07-01: 40 meq via ORAL
  Filled 2023-07-01: qty 2

## 2023-07-01 MED ORDER — SODIUM CHLORIDE 0.9 % IV SOLN
1.0000 g | Freq: Once | INTRAVENOUS | Status: AC
  Administered 2023-07-01: 1 g via INTRAVENOUS
  Filled 2023-07-01: qty 10

## 2023-07-01 MED ORDER — SODIUM CHLORIDE 0.9 % IV BOLUS
1000.0000 mL | Freq: Once | INTRAVENOUS | Status: AC
  Administered 2023-07-01: 1000 mL via INTRAVENOUS

## 2023-07-01 MED ORDER — ONDANSETRON HCL 4 MG/2ML IJ SOLN
4.0000 mg | Freq: Once | INTRAMUSCULAR | Status: AC
  Administered 2023-07-01: 4 mg via INTRAVENOUS
  Filled 2023-07-01: qty 2

## 2023-07-01 MED ORDER — IOHEXOL 300 MG/ML  SOLN
75.0000 mL | Freq: Once | INTRAMUSCULAR | Status: AC | PRN
  Administered 2023-07-01: 75 mL via INTRAVENOUS

## 2023-07-01 MED ORDER — CEFPODOXIME PROXETIL 200 MG PO TABS
200.0000 mg | ORAL_TABLET | Freq: Two times a day (BID) | ORAL | 0 refills | Status: AC
  Filled 2023-07-01: qty 20, 10d supply, fill #0

## 2023-07-01 MED ORDER — DIPHENHYDRAMINE HCL 50 MG/ML IJ SOLN
12.5000 mg | Freq: Once | INTRAMUSCULAR | Status: AC
  Administered 2023-07-01: 12.5 mg via INTRAVENOUS
  Filled 2023-07-01: qty 1

## 2023-07-01 MED ORDER — METOPROLOL TARTRATE 25 MG PO TABS
100.0000 mg | ORAL_TABLET | Freq: Two times a day (BID) | ORAL | Status: DC
  Administered 2023-07-01: 100 mg via ORAL
  Filled 2023-07-01: qty 4

## 2023-07-01 NOTE — Sepsis Progress Note (Signed)
eLink is following this Code Sepsis. °

## 2023-07-01 NOTE — ED Notes (Signed)
Patient transported to CT 

## 2023-07-01 NOTE — Sepsis Progress Note (Signed)
Per bedside RN Freida Busman, pt is refusing blood cultures but OK with receiving fluids and antibiotics.

## 2023-07-01 NOTE — ED Notes (Signed)
Patient refused blood cultures

## 2023-07-01 NOTE — ED Provider Notes (Signed)
[place] EMERGENCY DEPARTMENT AT [facility] Provider Note   CSN: [id number] Arrival date & time: 07/01/23  1031     History  Chief Complaint  Patient presents with   Nausea   Emesis    Stephanie Carrillo is a 67 y.o. female.  HPI   67 year old female with medical history significant for HTN, anxiety, depression, polysubstance abuse, CKD, GERD, presenting to the emergency department with nausea and vomiting.  The patient states that she had fried chicken at home on Friday and has had episodes of nausea and vomiting every consents.  That she states that she is on metoprolol and has been able to keep the medication down for the last few days.  She also has been able to take her home prescribed opiates.  She endorses persistent nausea and vomiting, denies any diarrhea.  She denies any fevers, chills, upper respiratory symptoms, denies any sick contacts.  She denies any abdominal pain.  She has not had a bowel movement in 6 days but this is not necessarily unusual for her.  She did note that she had not been passing gas for several days but states that she is passing gas today.  She is on Macrobid chronically for UTI suppression but has not been able to take any of her Macrobid as she has been vomiting her medications up.  She had been experiencing some dysuria over the past few days. No fevers or chills.   Home Medications Prior to Admission medications   Medication Sig Start Date End Date Taking? Authorizing Provider  cefpodoxime (VANTIN) 200 MG tablet Take 1 tablet (200 mg total) by mouth 2 (two) times daily for 10 days. 07/01/23 07/11/23 Yes Ernie Avena, MD  nitrofurantoin, macrocrystal-monohydrate, (MACROBID) 100 MG capsule Take 100 mg by mouth 2 (two) times daily. 06/08/23  Yes [provider]  ondansetron (ZOFRAN-ODT) 4 MG disintegrating tablet Dissolve 1 tablet under the tongue every 8 (eight) hours as needed. 07/01/23  Yes Ernie Avena, MD  oxyCODONE-acetaminophen  (PERCOCET/ROXICET) 5-325 MG tablet Take 1 tablet by mouth every 6 (six) hours as needed. 06/13/23  Yes [provider]  SUMAtriptan (IMITREX) 100 MG tablet Take 100 mg by mouth once. 06/03/23  Yes [provider]  acetaminophen (TYLENOL) 500 MG tablet Take 2 tablets (1,000 mg total) by mouth every 6 (six) hours as needed. 04/05/21   Meredeth Ide, MD  buprenorphine (SUBUTEX) 2 MG SUBL SL tablet Place 2 mg under the tongue 2 (two) times daily. 07/02/21   [provider]  clonazePAM (KLONOPIN) 0.5 MG tablet Take 1 tablet (0.5 mg total) by mouth 3 (three) times daily as needed for anxiety. 04/15/21   Eldred Manges, MD  cyanocobalamin (VITAMIN B12) 100 MCG tablet Take 100 mcg by mouth daily.    [provider]  diphenhydrAMINE (BENADRYL) 50 MG tablet Take 150 mg by mouth at bedtime as needed.    [provider]  FLUoxetine (PROZAC) 40 MG capsule Take 40 mg by mouth daily.    [provider]  gabapentin (NEURONTIN) 800 MG tablet Take 800 mg by mouth 3 (three) times daily.    [provider]  HYDROcodone-acetaminophen (NORCO) 10-325 MG tablet Take 1 tablet by mouth in the morning, at noon, in the evening, and at bedtime. 06/04/21   [provider]  levothyroxine (SYNTHROID) 50 MCG tablet Take 50 mcg by mouth daily.    [provider]  lisinopril (ZESTRIL) 10 MG tablet Take 10 mg by mouth as needed.  Taking when blood pressure is 160's (abnormal will take) 04/20/21   [provider]  magnesium gluconate (MAGONATE) 500 MG tablet Take 500 mg by mouth daily.    [provider]  metoprolol tartrate (LOPRESSOR) 100 MG tablet Take 1 tablet (100 mg total) by mouth 2 (two) times daily. 04/05/21   Meredeth Ide, MD  Multiple Vitamins-Minerals (CENTRUM SILVER 50+WOMEN) TABS Take 1 tablet by mouth daily.    [provider]  pantoprazole (PROTONIX) 40 MG tablet Take 1 tablet (40 mg total) by mouth daily at 6 (six) AM.  10/08/20   Rodolph Bong, MD  rosuvastatin (CRESTOR) 10 MG tablet Take 10 mg by mouth at bedtime. 07/02/21   [provider]  tiZANidine (ZANAFLEX) 4 MG tablet Take 4 mg by mouth 3 (three) times daily as needed.    [provider]      Allergies    Ciprofloxacin, Penicillins, and Oxycodone    Review of Systems   Review of Systems  All other systems reviewed and are negative.   Physical Exam Updated Vital Signs BP (!) 172/96   Pulse 90   Temp 98.1 F (36.7 C) (Oral)   Resp 16   Ht 5\' 6"  (1.676 m)   Wt 73.5 kg   SpO2 97%   BMI 26.15 kg/m  Physical Exam Vitals and nursing note reviewed.  Constitutional:      General: She is not in acute distress.    Appearance: She is well-developed.  HENT:     Head: Normocephalic and atraumatic.  Eyes:     Conjunctiva/sclera: Conjunctivae normal.  Cardiovascular:     Rate and Rhythm: Normal rate and regular rhythm.     Heart sounds: No murmur heard. Pulmonary:     Effort: Pulmonary effort is normal. No respiratory distress.     Breath sounds: Normal breath sounds.  Abdominal:     Palpations: Abdomen is soft.     Tenderness: There is generalized abdominal tenderness. There is no guarding or rebound.  Musculoskeletal:        General: No swelling.     Cervical back: Neck supple.  Skin:    General: Skin is warm and dry.     Capillary Refill: Capillary refill takes less than 2 seconds.  Neurological:     Mental Status: She is alert.  Psychiatric:        Mood and Affect: Mood normal.     ED Results / Procedures / Treatments   Labs (all labs ordered are listed, but only abnormal results are displayed) Labs Reviewed  CBC WITH DIFFERENTIAL/PLATELET - Abnormal; Notable for the following components:      Result Value   WBC 13.2 (*)    Neutro Abs 8.9 (*)    Monocytes Absolute 1.8 (*)    All other components within normal limits  COMPREHENSIVE METABOLIC PANEL - Abnormal; Notable for the following components:    Sodium 132 (*)    Potassium 3.2 (*)    Chloride 92 (*)    BUN 29 (*)    Total Protein 8.3 (*)    AST 50 (*)    Total Bilirubin 1.7 (*)    Anion gap 16 (*)    All other components within normal limits  URINALYSIS, W/ REFLEX TO CULTURE (INFECTION SUSPECTED) - Abnormal; Notable for the following components:   APPearance HAZY (*)    Hgb urine dipstick SMALL (*)    Ketones, ur 15 (*)    Protein, ur TRACE (*)  Nitrite POSITIVE (*)    Leukocytes,Ua LARGE (*)    All other components within normal limits  TROPONIN I (HIGH SENSITIVITY) - Abnormal; Notable for the following components:   Troponin I (High Sensitivity) 59 (*)    All other components within normal limits  TROPONIN I (HIGH SENSITIVITY) - Abnormal; Notable for the following components:   Troponin I (High Sensitivity) 62 (*)    All other components within normal limits  RESP PANEL BY RT-PCR (RSV, FLU A&B, COVID)  [id number]  URINE CULTURE  CULTURE, BLOOD (ROUTINE X 2)  CULTURE, BLOOD (ROUTINE X 2)  LIPASE, BLOOD  MAGNESIUM  LACTIC ACID, PLASMA  D-DIMER, QUANTITATIVE    EKG EKG Interpretation Date/Time:  Wednesday July 01 2023 11:04:19 EST Ventricular Rate:  119 PR Interval:  140 QRS Duration:  129 QT Interval:  323 QTC Calculation: 455 R Axis:   11  Text Interpretation: Sinus tachycardia Atrial premature complex Consider right atrial enlargement Nonspecific intraventricular conduction delay Nonspecific T abnormalities, diffuse leads Confirmed by Ernie Avena ([id number]) on 07/01/2023 11:15:12 AM  Radiology CT ABDOMEN PELVIS W CONTRAST  Result Date: 07/01/2023 CLINICAL DATA:  Abdominal pain, acute, nonlocalized. Generalized abdominal pain. Nausea and vomiting. EXAM: CT ABDOMEN AND PELVIS WITH CONTRAST TECHNIQUE: Multidetector CT imaging of the abdomen and pelvis was performed using the standard protocol following bolus administration of intravenous contrast. RADIATION DOSE REDUCTION: This exam was performed according to  the departmental dose-optimization program which includes automated exposure control, adjustment of the mA and/or kV according to patient size and/or use of iterative reconstruction technique. CONTRAST:  75mL OMNIPAQUE IOHEXOL 300 MG/ML  SOLN COMPARISON:  CT scan abdomen and pelvis from 04/08/2023. FINDINGS: Lower chest: There are patchy atelectatic changes in the visualized lung bases. No overt consolidation. No pleural effusion. The heart is normal in size. No pericardial effusion. Hepatobiliary: The liver is normal in size. Non-cirrhotic configuration. No suspicious mass. No intrahepatic or extrahepatic bile duct dilation. No calcified gallstones. Normal gallbladder wall thickness. No pericholecystic inflammatory changes. Pancreas: Unremarkable. No pancreatic ductal dilatation or surrounding inflammatory changes. Spleen: Within normal limits. No focal lesion. Adrenals/Urinary Tract: Adrenal glands are unremarkable. There are multiple ill-defined peripheral wedge-shaped hypoattenuating areas in bilateral kidneys, right more than left (marked with electronic arrow sign on series 5), majority of which appear slightly more pronounced than the prior exam. There is also new/increased bilateral urothelial thickening and bilateral ureteric wall thickening. In appropriate clinical settings findings favor urinary tract infection/acute pyelonephritis. No nephroureterolithiasis or hydroureteronephrosis on either side. Several areas of focal scarring noted throughout bilateral kidneys, suggesting sequela of chronic urinary tract infections/reflux. Partially distended urinary bladder. There is mild diffuse circumferential wall thickening and subtle perivesical fat stranding. Stomach/Bowel: No disproportionate dilation of the small or large bowel loops. No evidence of abnormal bowel wall thickening or inflammatory changes. The appendix is unremarkable. Vascular/Lymphatic: No ascites or pneumoperitoneum. No abdominal or pelvic  lymphadenopathy, by size criteria. No aneurysmal dilation of the major abdominal arteries. There are mild peripheral atherosclerotic vascular calcifications of the aorta and its major branches. Reproductive: The uterus is unremarkable. No large adnexal mass. Other: There is a tiny fat containing umbilical hernia. The soft tissues and abdominal wall are otherwise unremarkable. Musculoskeletal: No suspicious osseous lesions. There are moderate multilevel degenerative changes in the visualized spine. Partial solid fusion of L2-3 and L4-5 vertebral bodies noted. IMPRESSION: *Interval increase in the size of previously seen multiple ill-defined peripheral wedge-shaped hypoattenuating areas in bilateral kidneys. There is also new/increased bilateral urothelial  thickening and bilateral ureteric wall thickening. There is also mild thickening of urinary bladder wall with subtle perivesical fat stranding. These constellation of findings favor acute pyelonephritis/urinary tract infection. Correlate clinically and with urinalysis. *No other acute inflammatory process identified within the abdomen or pelvis. No bowel obstruction. *Multiple other nonacute observations, as described above. Electronically Signed   By: Jules Schick M.D.   On: 07/01/2023 13:46    Procedures Procedures    Medications Ordered in ED Medications  metoprolol tartrate (LOPRESSOR) tablet 100 mg (100 mg Oral Given 07/01/23 1231)  cefTRIAXone (ROCEPHIN) 1 g in sodium chloride 0.9 % 100 mL IVPB (1 g Intravenous New Bag/Given 07/01/23 1449)  lactated ringers infusion (has no administration in time range)  sodium chloride 0.9 % bolus 1,000 mL (has no administration in time range)  sodium chloride 0.9 % bolus 1,000 mL (0 mLs Intravenous Stopped 07/01/23 1245)  ondansetron (ZOFRAN) injection 4 mg (4 mg Intravenous Given 07/01/23 1115)  potassium chloride SA (KLOR-CON M) CR tablet 40 mEq (40 mEq Oral Given 07/01/23 1244)  iohexol (OMNIPAQUE) 300  MG/ML solution 75 mL (75 mLs Intravenous Contrast Given 07/01/23 1157)  metoCLOPramide (REGLAN) injection 10 mg (10 mg Intravenous Given 07/01/23 1221)  diphenhydrAMINE (BENADRYL) injection 12.5 mg (12.5 mg Intravenous Given 07/01/23 1221)    ED Course/ Medical Decision Making/ A&P Clinical Course as of 07/01/23 1502  Wed Jul 01, 2023  1158 WBC(!): 13.2 [JL]  1159 Potassium(!): 3.2 [JL]  1248 Troponin I (High Sensitivity)(!): 59 [JL]  1248 D-Dimer, Quant: 0.30 [JL]  1249 Magnesium: 1.7 [JL]  1249 Lipase: 43 [JL]  1436 Protein(!): TRACE [JL]  1436 Nitrite(!): POSITIVE [JL]  1436 Leukocytes,Ua(!): LARGE [JL]  1436 WBC, UA: >50 [JL]    Clinical Course User Index [JL] Ernie Avena, MD                                 Medical Decision Making Amount and/or Complexity of Data Reviewed Labs: ordered. Decision-making details documented in ED Course. Radiology: ordered.  Risk Prescription drug management.    68 year old female with medical history significant for HTN, anxiety, depression, polysubstance abuse, CKD, GERD, presenting to the emergency department with nausea and vomiting.  The patient states that she had fried chicken at home on Friday and has had episodes of nausea and vomiting every consents.  That she states that she is on metoprolol and has been able to keep the medication down for the last few days.  She also has been able to take her home prescribed opiates.  She endorses persistent nausea and vomiting, denies any diarrhea.  She denies any fevers, chills, upper respiratory symptoms, denies any sick contacts.  She denies any abdominal pain.  She has not had a bowel movement in 6 days but this is not necessarily unusual for her.  She did note that she had not been passing gas for several days but states that she is passing gas today.  She is on Macrobid chronically for UTI suppression but has not been able to take any of her Macrobid as she has been vomiting her medications  up.  She had been experiencing some dysuria over the past few days. No fevers or chills.  On arrival, the patient was afebrile, tachycardic heart rate 115, BP 159/121, not tachypneic, saturating 90% on room air.  Sinus tachycardia noted on cardiac telemetry.  Physical exam revealed mild generalized abdominal tenderness to palpation, no  rebound or guarding, no clear focality.  Sinus tachycardia noted.  Patient symptoms consistent with likely gastritis, foodborne illness, less likely bowel obstruction or ileus.  Considered biliary disease, UTI/pyelonephritis, diverticulitis, appendicitis.   Labs: CBC with a leukocytosis to 13.2, lactic acid normal at 0.8, magnesium normal at 1.7, CMP with mild hyponatremia 132, kalemia to 3.2, replenished orally, lipase normal, D-dimer negative, COVID and influenza and RSV PCR testing negative, initial cardiac troponin 59, repeat flat at 62.  Urinalysis was grossly positive for UTI with nitrates positive, leukocytes large, greater than 50 WBCs.  Patient with urinary symptoms.  Imaging:  IMPRESSION:  *Interval increase in the size of previously seen multiple  ill-defined peripheral wedge-shaped hypoattenuating areas in  bilateral kidneys. There is also new/increased bilateral urothelial  thickening and bilateral ureteric wall thickening. There is also  mild thickening of urinary bladder wall with subtle perivesical fat  stranding. These constellation of findings favor acute  pyelonephritis/urinary tract infection. Correlate clinically and  with urinalysis.  *No other acute inflammatory process identified within the abdomen  or pelvis. No bowel obstruction.  *Multiple other nonacute observations, as described above.   Due to the patient's evidence of pyelonephritis, infected urine, leukocytosis and tachycardia, concern for sepsis, code sepsis called and blood cultures were ordered and the patient was administered IV Rocephin.  I discussed the need for admission for  observation in the setting of this however the patient expressed a strong desire to leave AGAINST MEDICAL ADVICE.  I explained the risk of bacteremia and subsequent cardiovascular collapse, resulting in cardiac arrest and death.  The patient preferred to still be discharged AGAINST MEDICAL ADVICE.  Will discharge the patient on Vantin as her Macrobid is not adequate treatment for pyelonephritis.  Will provide Zofran for nausea.  Advised strict return precautions in the event of any severe worsening symptoms.   Final Clinical Impression(s) / ED Diagnoses Final diagnoses:  Pyelonephritis  Sepsis, due to unspecified organism, unspecified whether acute organ dysfunction present [facility])    Rx / DC Orders ED Discharge Orders          Ordered    cefpodoxime (VANTIN) 200 MG tablet  2 times daily        07/01/23 1455    ondansetron (ZOFRAN-ODT) 4 MG disintegrating tablet  Every 8 hours PRN        07/01/23 1455              Ernie Avena, MD 07/01/23 1502

## 2023-07-01 NOTE — ED Notes (Signed)
Patient refusing to be admitted.  Dr Karene Fry with this nurse present reviewed the risk of leaving against medical advice including getting worse up to possible death if not treated.  Patient states she wants to go home and not admitted.  Encouraged to return if changes her decision or gets worse.

## 2023-07-01 NOTE — ED Triage Notes (Addendum)
Pt brought in by [facility] from home. C/O of nausea and vomiting since Friday after eating fried chicken. Pt states she has not been taking her metoprolol. Been having elevated BP. CBG 162. Chronic back and leg pain.

## 2023-07-01 NOTE — ED Notes (Addendum)
Assisted pt to [facility]. Pt attempted to provide urine sample. Unsuccessful.

## 2023-07-01 NOTE — Discharge Instructions (Addendum)
You were explained the diagnosis of sepsis from pyelonephritis however you strongly desired to leave AGAINST MEDICAL ADVICE despite the risk of cardiovascular collapse and death.  We will discharge you on Vantin as Macrobid is not indicated for treatment of pyelonephritis.

## 2023-07-03 LAB — URINE CULTURE: Culture: >=100000 — AB

## 2023-07-04 ENCOUNTER — Telehealth (HOSPITAL_BASED_OUTPATIENT_CLINIC_OR_DEPARTMENT_OTHER): Payer: Self-pay | Admitting: *Deleted

## 2023-07-04 NOTE — Telephone Encounter (Signed)
Post ED Visit - Positive Culture Follow-up: Unsuccessful Patient Follow-up  Culture assessed and recommendations reviewed by:  [x]  Wilburn Cornelia, Pharm.D. []  Celedonio Miyamoto, Pharm.D., BCPS AQ-ID []  Garvin Fila, Pharm.D., BCPS []  Georgina Pillion, Pharm.D., BCPS []  [place], [address].D., BCPS, AAHIVP []  Estella Husk, Pharm.D., BCPS, AAHIVP []  Sherlynn Carbon, PharmD []  Pollyann Samples, PharmD, BCPS  Positive urine culture  Plan: Continue Cefpodoxime Stop Nitrofurantoin due to resistant organisms. F/U with urology for alternative suppressive abx therapy per Beckey Downing, MD  Unable to contact patient , letter will be sent to address on file  Stephanie Carrillo 07/04/2023, 4:40 PM

## 2023-07-04 NOTE — Progress Notes (Signed)
ED Antimicrobial Stewardship Positive Culture Follow Up   Stephanie Carrillo is an 67 y.o. female who presented to [facility] on 07/01/2023 with a chief complaint of  Chief Complaint  Patient presents with   Nausea   Emesis    Recent Results (from the past 720 hour(s))  Resp panel by RT-PCR (RSV, Flu A&B, Covid) Anterior Nasal Swab     Status: None   Collection Time: 07/01/23  1:24 PM   Specimen: Anterior Nasal Swab  Result Value Ref Range Status   SARS Coronavirus 2 by RT PCR NEGATIVE NEGATIVE Final    Comment: (NOTE) SARS-CoV-2 target nucleic acids are NOT DETECTED.  The SARS-CoV-2 RNA is generally detectable in upper respiratory specimens during the acute phase of infection. The lowest concentration of SARS-CoV-2 viral copies this assay can detect is 138 copies/mL. A negative result does not preclude SARS-Cov-2 infection and should not be used as the sole basis for treatment or other patient management decisions. A negative result may occur with  improper specimen collection/handling, submission of specimen other than nasopharyngeal swab, presence of viral mutation(s) within the areas targeted by this assay, and inadequate number of viral copies(<138 copies/mL). A negative result must be combined with clinical observations, patient history, and epidemiological information. The expected result is Negative.  Fact Sheet for Patients:  BloggerCourse.com  Fact Sheet for Healthcare Providers:  SeriousBroker.it  This test is no t yet approved or cleared by the [place] FDA and  has been authorized for detection and/or diagnosis of SARS-CoV-2 by FDA under an Emergency Use Authorization (EUA). This EUA will remain  in effect (meaning this test can be used) for the duration of the COVID-19 declaration under Section 564(b)(1) of the Act, 21 U.S.C.section 360bbb-3(b)(1), unless the authorization is terminated  or revoked sooner.        Influenza A by PCR NEGATIVE NEGATIVE Final   Influenza B by PCR NEGATIVE NEGATIVE Final    Comment: (NOTE) The Xpert Xpress SARS-CoV-2/FLU/RSV plus assay is intended as an aid in the diagnosis of influenza from Nasopharyngeal swab specimens and should not be used as a sole basis for treatment. Nasal washings and aspirates are unacceptable for Xpert Xpress SARS-CoV-2/FLU/RSV testing.  Fact Sheet for Patients: BloggerCourse.com  Fact Sheet for Healthcare Providers: SeriousBroker.it  This test is not yet approved or cleared by the [place] FDA and has been authorized for detection and/or diagnosis of SARS-CoV-2 by FDA under an Emergency Use Authorization (EUA). This EUA will remain in effect (meaning this test can be used) for the duration of the COVID-19 declaration under Section 564(b)(1) of the Act, 21 U.S.C. section 360bbb-3(b)(1), unless the authorization is terminated or revoked.     Resp Syncytial Virus by PCR NEGATIVE NEGATIVE Final    Comment: (NOTE) Fact Sheet for Patients: BloggerCourse.com  Fact Sheet for Healthcare Providers: SeriousBroker.it  This test is not yet approved or cleared by the [place] FDA and has been authorized for detection and/or diagnosis of SARS-CoV-2 by FDA under an Emergency Use Authorization (EUA). This EUA will remain in effect (meaning this test can be used) for the duration of the COVID-19 declaration under Section 564(b)(1) of the Act, 21 U.S.C. section 360bbb-3(b)(1), unless the authorization is terminated or revoked.  Performed at [facility], [address] [place], [place] [address]   Urine Culture     Status: Abnormal   Collection Time: 07/01/23  1:55 PM   Specimen: Urine, Random  Result Value Ref Range Status  Specimen Description   Final    URINE, RANDOM Performed at [facility], [address], [place], [place] [address]    Special Requests   Final    NONE Reflexed from [id number] Performed at [facility], [address] [place], [place] [address]    Culture >=100,000 COLONIES/mL PROTEUS MIRABILIS (A)  Final   Report Status 07/03/2023 FINAL  Final   Organism ID, Bacteria PROTEUS MIRABILIS (A)  Final      Susceptibility   Proteus mirabilis - MIC*    AMPICILLIN <=2 SENSITIVE Sensitive     CEFAZOLIN <=4 SENSITIVE Sensitive     CEFEPIME <=0.12 SENSITIVE Sensitive     CEFTRIAXONE <=0.25 SENSITIVE Sensitive     CIPROFLOXACIN <=0.25 SENSITIVE Sensitive     GENTAMICIN <=1 SENSITIVE Sensitive     IMIPENEM 2 SENSITIVE Sensitive     NITROFURANTOIN 128 RESISTANT Resistant     TRIMETH/SULFA <=20 SENSITIVE Sensitive     AMPICILLIN/SULBACTAM <=2 SENSITIVE Sensitive     PIP/TAZO <=4 SENSITIVE Sensitive ug/mL    * >=100,000 COLONIES/mL PROTEUS MIRABILIS   Treated with cefpodoxime, which is susceptible. However, patient also taking nitrofurantoin for UTI suppression, however Proteus is resistant to nitrofurantoin.   New antibiotic prescription: Continue cefpodoxime. STOP nitrofurantoin. Recommend patient follow up with PCP or urologist for alternative UTI suppressive antibiotic regimen.  ED Provider: Dr. Modesto Charon, PharmD, BCPS 07/04/2023, 1:24 PM Clinical Pharmacist Monday - Friday phone -  [phone] Saturday - Sunday phone - [phone]

## 2023-07-31 ENCOUNTER — Emergency Department (HOSPITAL_COMMUNITY)
Admission: EM | Admit: 2023-07-31 | Discharge: 2023-08-01 | Disposition: A | Discharge status: Home / Self Care | Payer: Medicare Other

## 2023-07-31 ENCOUNTER — Other Ambulatory Visit: Payer: Self-pay

## 2023-07-31 DIAGNOSIS — R350 Frequency of micturition: Secondary | ICD-10-CM | POA: Insufficient documentation

## 2023-07-31 DIAGNOSIS — Z79899 Other long term (current) drug therapy: Secondary | ICD-10-CM | POA: Diagnosis not present

## 2023-07-31 DIAGNOSIS — K219 Gastro-esophageal reflux disease without esophagitis: Secondary | ICD-10-CM | POA: Diagnosis not present

## 2023-07-31 DIAGNOSIS — R000 Tachycardia, unspecified: Secondary | ICD-10-CM | POA: Diagnosis not present

## 2023-07-31 DIAGNOSIS — R112 Nausea with vomiting, unspecified: Secondary | ICD-10-CM | POA: Diagnosis present

## 2023-07-31 DIAGNOSIS — N183 Chronic kidney disease, stage 3 unspecified: Secondary | ICD-10-CM | POA: Diagnosis not present

## 2023-07-31 DIAGNOSIS — R3915 Urgency of urination: Secondary | ICD-10-CM | POA: Diagnosis not present

## 2023-07-31 DIAGNOSIS — R109 Unspecified abdominal pain: Secondary | ICD-10-CM | POA: Insufficient documentation

## 2023-07-31 DIAGNOSIS — I129 Hypertensive chronic kidney disease with stage 1 through stage 4 chronic kidney disease, or unspecified chronic kidney disease: Secondary | ICD-10-CM | POA: Diagnosis not present

## 2023-07-31 LAB — COMPREHENSIVE METABOLIC PANEL
ALT: 13 U/L (ref 0–44)
AST: 22 U/L (ref 15–41)
Albumin: 4.7 g/dL (ref 3.5–5.0)
Alkaline Phosphatase: 143 U/L — ABNORMAL HIGH (ref 38–126)
Anion gap: 16 — ABNORMAL HIGH (ref 5–15)
BUN: 19 mg/dL (ref 8–23)
CO2: 17 mmol/L — ABNORMAL LOW (ref 22–32)
Calcium: 9.7 mg/dL (ref 8.9–10.3)
Chloride: 103 mmol/L (ref 98–111)
Creatinine, Ser: 0.73 mg/dL (ref 0.44–1.00)
GFR, Estimated: >60 mL/min (ref >60)
Glucose, Bld: 188 mg/dL — ABNORMAL HIGH (ref 70–99)
Potassium: 3.4 mmol/L — ABNORMAL LOW (ref 3.5–5.1)
Sodium: 136 mmol/L (ref 135–145)
Total Bilirubin: 1 mg/dL (ref <1.2)
Total Protein: 9.9 g/dL — ABNORMAL HIGH (ref 6.5–8.1)

## 2023-07-31 LAB — CBC WITH DIFFERENTIAL/PLATELET
Abs Immature Granulocytes: 0.09 10*3/uL — ABNORMAL HIGH (ref 0.00–0.07)
Basophils Absolute: 0 10*3/uL (ref 0.0–0.1)
Basophils Relative: 0 %
Eosinophils Absolute: 0.1 10*3/uL (ref 0.0–0.5)
Eosinophils Relative: 0 %
HCT: 48.2 % — ABNORMAL HIGH (ref 36.0–46.0)
Hemoglobin: 15.8 g/dL — ABNORMAL HIGH (ref 12.0–15.0)
Immature Granulocytes: 1 %
Lymphocytes Relative: 7 %
Lymphs Abs: 1 10*3/uL (ref 0.7–4.0)
MCH: 31.1 pg (ref 26.0–34.0)
MCHC: 32.8 g/dL (ref 30.0–36.0)
MCV: 94.9 fL (ref 80.0–100.0)
Monocytes Absolute: 0.3 10*3/uL (ref 0.1–1.0)
Monocytes Relative: 2 %
Neutro Abs: 12.5 10*3/uL — ABNORMAL HIGH (ref 1.7–7.7)
Neutrophils Relative %: 90 %
Platelets: 642 10*3/uL — ABNORMAL HIGH (ref 150–400)
RBC: 5.08 MIL/uL (ref 3.87–5.11)
RDW: 14.6 % (ref 11.5–15.5)
WBC: 13.9 10*3/uL — ABNORMAL HIGH (ref 4.0–10.5)
nRBC: 0 % (ref 0.0–0.2)

## 2023-07-31 MED ORDER — ONDANSETRON HCL 4 MG/2ML IJ SOLN
4.0000 mg | Freq: Once | INTRAMUSCULAR | Status: AC
  Administered 2023-07-31: 4 mg via INTRAVENOUS
  Filled 2023-07-31: qty 2

## 2023-07-31 MED ORDER — METOPROLOL TARTRATE 5 MG/5ML IV SOLN
10.0000 mg | Freq: Once | INTRAVENOUS | Status: AC
  Administered 2023-07-31: 10 mg via INTRAVENOUS
  Filled 2023-07-31: qty 10

## 2023-07-31 MED ORDER — SODIUM CHLORIDE 0.9 % IV BOLUS
1000.0000 mL | Freq: Once | INTRAVENOUS | Status: AC
  Administered 2023-07-31: 1000 mL via INTRAVENOUS

## 2023-07-31 MED ORDER — SODIUM CHLORIDE 0.9 % IV SOLN
12.5000 mg | Freq: Four times a day (QID) | INTRAVENOUS | Status: DC | PRN
  Administered 2023-07-31: 12.5 mg via INTRAVENOUS
  Filled 2023-07-31: qty 12.5

## 2023-07-31 NOTE — ED Triage Notes (Signed)
Pt BIB [facility] from home. Pt c/o N+V for past 12 hours. Pt also c/o urinary urgency with retention. Pt Hx pyelonephritis. Left [facility] from [facility] with sepsis and pyelonephritis earlier this week. Ems administered 4 mg zofran. 6-7 episodes of vomitting with EMS.  22g IV LH 199/117 110HR 98% RA 221 CBG

## 2023-07-31 NOTE — ED Notes (Signed)
This RN attempted to initiate 2nd IV line and collect 2nd set of cultures with no success.  Thayer Ohm RN able to initiate one IV line and collect one set of cultures. Provider notified via secure chat. Provider stated that if antibiotics was ordered, the second set would would need to be collected before administration.

## 2023-07-31 NOTE — ED Provider Notes (Signed)
[place] EMERGENCY DEPARTMENT AT [facility] Provider Note   CSN: [id number] Arrival date & time: 07/31/23  2141     History {Add pertinent medical, surgical, social history, OB history to HPI:1} Chief Complaint  Patient presents with   Nausea    Argentina Stadtler is a 67 y.o. female.  Patient to ED with nausea and vomiting that started this morning. She also c/o urinary urgency/frequency, recent diagnosis of recurrent pyelonephritis when seen in ED on 11/27 but left AMA from the emergency department. She reports persistent flank and abdominal discomfort that did not really get better from last evaluation. No cough, congestion. She reports her temperature has been low with oral temperatures at 95 and 97.   The history is provided by the patient. No language interpreter was used.       Home Medications Prior to Admission medications   Medication Sig Start Date End Date Taking? Authorizing Provider  acetaminophen (TYLENOL) 500 MG tablet Take 2 tablets (1,000 mg total) by mouth every 6 (six) hours as needed. 04/05/21   Meredeth Ide, MD  buprenorphine (SUBUTEX) 2 MG SUBL SL tablet Place 2 mg under the tongue 2 (two) times daily. 07/02/21   [provider]  clonazePAM (KLONOPIN) 0.5 MG tablet Take 1 tablet (0.5 mg total) by mouth 3 (three) times daily as needed for anxiety. 04/15/21   Eldred Manges, MD  cyanocobalamin (VITAMIN B12) 100 MCG tablet Take 100 mcg by mouth daily.    [provider]  diphenhydrAMINE (BENADRYL) 50 MG tablet Take 150 mg by mouth at bedtime as needed.    [provider]  FLUoxetine (PROZAC) 40 MG capsule Take 40 mg by mouth daily.    [provider]  gabapentin (NEURONTIN) 800 MG tablet Take 800 mg by mouth 3 (three) times daily.    [provider]  HYDROcodone-acetaminophen (NORCO) 10-325 MG tablet Take 1 tablet by mouth in the morning, at noon, in the evening, and at bedtime. 06/04/21   [provider]  levothyroxine (SYNTHROID) 50 MCG tablet Take 50 mcg by mouth daily.    [provider]  lisinopril (ZESTRIL) 10 MG tablet Take 10 mg by mouth as needed. Taking when blood pressure is 160's (abnormal will take) 04/20/21   [provider]  magnesium gluconate (MAGONATE) 500 MG tablet Take 500 mg by mouth daily.    [provider]  metoprolol tartrate (LOPRESSOR) 100 MG tablet Take 1 tablet (100 mg total) by mouth 2 (two) times daily. 04/05/21   Meredeth Ide, MD  Multiple Vitamins-Minerals (CENTRUM SILVER 50+WOMEN) TABS Take 1 tablet by mouth daily.    [provider]  nitrofurantoin, macrocrystal-monohydrate, (MACROBID) 100 MG capsule Take 100 mg by mouth 2 (two) times daily. 06/08/23   [provider]  ondansetron (ZOFRAN-ODT) 4 MG disintegrating tablet Dissolve 1 tablet under the tongue every 8 (eight) hours as needed. 07/01/23   Ernie Avena, MD  oxyCODONE-acetaminophen (PERCOCET/ROXICET) 5-325 MG tablet Take 1 tablet by mouth every 6 (six) hours as needed. 06/13/23   [provider]  pantoprazole (PROTONIX) 40 MG tablet Take 1 tablet (40 mg total) by mouth daily at 6 (six) AM. 10/08/20   Rodolph Bong, MD  rosuvastatin (CRESTOR) 10 MG tablet Take 10 mg by mouth at bedtime. 07/02/21   [provider]  SUMAtriptan (IMITREX) 100 MG tablet Take 100 mg by mouth once. 06/03/23   [provider]  tiZANidine (ZANAFLEX) 4 MG tablet Take 4 mg  by mouth 3 (three) times daily as needed.    [provider]      Allergies    Ciprofloxacin, Penicillins, and Oxycodone    Review of Systems   Review of Systems  Physical Exam Updated Vital Signs BP (!) 217/157   Pulse 97   Temp 97.8 F (36.6 C) (Oral)   Resp 17   Ht 5\' 6"  (1.676 m)   Wt 73.5 kg   SpO2 99%   BMI 26.15 kg/m  Physical Exam  ED Results / Procedures / Treatments   Labs (all labs ordered are listed, but only abnormal results are displayed) Labs  Reviewed - No data to display  EKG None  Radiology No results found.  Procedures Procedures  {Document cardiac monitor, telemetry assessment procedure when appropriate:1}  Medications Ordered in ED Medications - No data to display  ED Course/ Medical Decision Making/ A&P   {   Click here for ABCD2, HEART and other calculatorsREFRESH Note before signing :1}                              Medical Decision Making This patient presents to the ED for concern of flank pain, this involves an extensive number of treatment options, and is a complaint that carries with it a high risk of complications and morbidity.  The differential diagnosis includes pyelonephritis, UTI, sepsis   Co morbidities that complicate the patient evaluation  HTN, seizures, polysubstance abuse, meningitis, CKD (patient reports stage 3), GERD, arthritis   Additional history obtained:  Additional history and/or information obtained from chart review, notable for EMR - previous ED visit 11/27, diagnosis pyelonephritis, concern for sepsis.   Lab Tests:  I Ordered, and personally interpreted labs.  The pertinent results include:  ***    Imaging Studies ordered:  I ordered imaging studies including *** I independently visualized and interpreted imaging which showed *** I agree with the radiologist interpretation   Cardiac Monitoring:  The patient was maintained on a cardiac monitor.  I personally viewed and interpreted the cardiac monitored which showed an underlying rhythm of: ***   Medicines ordered and prescription drug management:  I ordered medication including ***  for *** Reevaluation of the patient after these medicines showed that the patient {resolved/improved/worsened:23923::"improved"} I have reviewed the patients home medicines and have made adjustments as needed   Test Considered:  ***   Critical Interventions:  ***   Consultations Obtained:  I requested consultation with the  ***,  and discussed lab and imaging findings as well as pertinent plan - they recommend: ***   Problem List / ED Course:  ***   Reevaluation:  After the interventions noted above, I reevaluated the patient and found that they have :{resolved/improved/worsened:23923::"improved"}   Social Determinants of Health:  ***   Disposition:  After consideration of the diagnostic results and the patients response to treatment, I feel that the patient would benefit from ***.   Amount and/or Complexity of Data Reviewed Labs: ordered.   ***  {Document critical care time when appropriate:1} {Document review of labs and clinical decision tools ie heart score, Chads2Vasc2 etc:1}  {Document your independent review of radiology images, and any outside records:1} {Document your discussion with family members, caretakers, and with consultants:1} {Document social determinants of health affecting pt's care:1} {Document your decision making why or why not admission, treatments were needed:1} Final Clinical Impression(s) / ED Diagnoses Final diagnoses:  None  Rx / DC Orders ED Discharge Orders     None

## 2023-08-01 ENCOUNTER — Emergency Department (HOSPITAL_COMMUNITY): Payer: Medicare Other

## 2023-08-01 LAB — URINALYSIS, ROUTINE W REFLEX MICROSCOPIC
Bacteria, UA: NONE SEEN
Bilirubin Urine: NEGATIVE
Glucose, UA: 150 mg/dL — AB
Ketones, ur: NEGATIVE mg/dL
Leukocytes,Ua: NEGATIVE
Nitrite: NEGATIVE
Protein, ur: 100 mg/dL — AB
Specific Gravity, Urine: 1.013 (ref 1.005–1.030)
pH: 6 (ref 5.0–8.0)

## 2023-08-01 LAB — RAPID URINE DRUG SCREEN, HOSP PERFORMED
Amphetamines: NOT DETECTED
Barbiturates: NOT DETECTED
Benzodiazepines: NOT DETECTED
Cocaine: NOT DETECTED
Opiates: NOT DETECTED
Tetrahydrocannabinol: NOT DETECTED

## 2023-08-01 MED ORDER — IOHEXOL 300 MG/ML  SOLN
100.0000 mL | Freq: Once | INTRAMUSCULAR | Status: AC | PRN
  Administered 2023-08-01: 100 mL via INTRAVENOUS

## 2023-08-01 MED ORDER — METOPROLOL TARTRATE 25 MG PO TABS
100.0000 mg | ORAL_TABLET | Freq: Once | ORAL | Status: AC
  Administered 2023-08-01: 100 mg via ORAL
  Filled 2023-08-01: qty 4

## 2023-08-01 MED ORDER — HYDRALAZINE HCL 20 MG/ML IJ SOLN
10.0000 mg | Freq: Once | INTRAMUSCULAR | Status: AC
  Administered 2023-08-01: 10 mg via INTRAVENOUS
  Filled 2023-08-01: qty 1

## 2023-08-01 MED ORDER — METOCLOPRAMIDE HCL 10 MG PO TABS: 10.0000 mg | ORAL_TABLET | Freq: Four times a day (QID) | ORAL | 0 refills | Status: DC

## 2023-08-01 MED ORDER — ACETAMINOPHEN 500 MG PO TABS: 1000.0000 mg | ORAL_TABLET | Freq: Once | ORAL | Status: DC

## 2023-08-01 MED ORDER — DIPHENHYDRAMINE HCL 50 MG/ML IJ SOLN
12.5000 mg | Freq: Once | INTRAMUSCULAR | Status: AC
  Administered 2023-08-01: 12.5 mg via INTRAVENOUS
  Filled 2023-08-01: qty 1

## 2023-08-01 MED ORDER — HYDROMORPHONE HCL 1 MG/ML IJ SOLN
1.0000 mg | Freq: Once | INTRAMUSCULAR | Status: AC
  Administered 2023-08-01: 1 mg via INTRAVENOUS
  Filled 2023-08-01: qty 1

## 2023-08-01 MED ORDER — SODIUM CHLORIDE 0.9 % IV BOLUS
1000.0000 mL | Freq: Once | INTRAVENOUS | Status: AC
  Administered 2023-08-01: 1000 mL via INTRAVENOUS

## 2023-08-01 MED ORDER — METOCLOPRAMIDE HCL 5 MG/ML IJ SOLN
10.0000 mg | Freq: Once | INTRAMUSCULAR | Status: AC
  Administered 2023-08-01: 10 mg via INTRAVENOUS
  Filled 2023-08-01: qty 2

## 2023-08-01 NOTE — ED Provider Notes (Signed)
Patient is a handoff from [place], PA-C.  In short, patient here with concerns for poorly controlled nausea, vomiting and progressively worsening tachycardia.  Patient takes metoprolol which she reports she has taken.  Plan at time of handoff is to evaluate for any possible improvement in patient's tachycardia as she has not had improvement in her blood pressure and nausea.  Otherwise stable and simply trying to ensure that patient's tachycardia is improving.  No acute indication for admission at this time.  Physical Exam  BP (!) 124/96 (BP Location: Right Arm)   Pulse (!) 118   Temp 98.7 F (37.1 C) (Oral)   Resp 18   Ht 5\' 6"  (1.676 m)   Wt 73.5 kg   SpO2 96%   BMI 26.15 kg/m   Physical Exam Constitutional:      Appearance: She is well-developed.  HENT:     Head: Normocephalic.     Mouth/Throat:     Mouth: Mucous membranes are dry.  Cardiovascular:     Rate and Rhythm: Regular rhythm. Tachycardia present.     Heart sounds: No murmur heard. Pulmonary:     Effort: Pulmonary effort is normal.     Breath sounds: Normal breath sounds. No wheezing, rhonchi or rales.  Abdominal:     Palpations: Abdomen is soft.     Tenderness: There is abdominal tenderness (RLQ, moderate, without guarding). There is no guarding or rebound.  Musculoskeletal:        General: Normal range of motion.     Cervical back: Normal range of motion and neck supple.  Skin:    General: Skin is warm and dry.  Neurological:     General: No focal deficit present.     Mental Status: She is alert and oriented to person, place, and time.     Procedures  Procedures . ED Course / MDM   Clinical Course as of 08/01/23 0918  Fri Jul 31, 2023  2356 Patient continues to have nausea with vomiting after 4 mg Zofran (EMS), and 12.5 mg Phenergan in ED. Blood pressure still elevated after IV metoprolol - will continue to monitor. For now, will repeat Zofran 4 mg IV dose.  [SU]  Sat Aug 01, 2023  0033 BP improving,  nausea improved. Patient providing urine specimen now.  [SU]  [id number] Patient continues to have recurrent nausea with vomiting. Continues to be tachycardic to 118 on the monitor. Tender to lower abdomen without guarding or peritonitis, however, will obtain CT given persistent symptoms. No evidence urinary infection. Has a mild leukocytosis.  [SU]  0525 Remains hypertensive and tachycardic. She reports she feels better, not having significant pain, nausea is controlled. Further medications/fluids ordered for HTN, tachycardia. Will continue to observe. She voices she wants to be go home today and does not want to be admitted.  [SU]    Clinical Course User Index [SU] Elpidio Anis, PA-C   Medical Decision Making Amount and/or Complexity of Data Reviewed Labs: ordered. Radiology: ordered.  Risk OTC drugs. Prescription drug management.   Patient is a handoff from [place], PA-C.  Please see her note for full HPI and physical exam findings.  In short, patient here with concerns for poorly controlled nausea, vomiting and progressively worsening tachycardia.  Patient takes metoprolol which she reports she has taken.  Plan at time of handoff is to evaluate for any possible improvement in patient's tachycardia as she has not had improvement in her blood pressure and nausea.  Otherwise stable and simply trying to  ensure that patient's tachycardia is improving.  No acute indication for admission at this time.  Patient's heart rate is slowly improving.  Most recent vitals showed a heart rate is at 118.  When I evaluated patient in the room, heart rate was persistently between 100 to 115 bpm.  Patient does report that she has known anxiety and typically takes Klonopin 3 times a day but has not had Klonopin now for the last 12+ hours.  She is unsure if this may be potentially causing some of the racing heart feeling.  She denies any other acute concerns or symptoms at this time.  She does report that she has  Klonopin available at home.  Given the patient has had notable improvement, will plan on discharging patient home with close PCP follow-up.  Discussed return precautions.  Patient otherwise stable and discharged home.       Smitty Knudsen, PA-C 08/01/23 [phone]

## 2023-08-01 NOTE — ED Notes (Signed)
Rn to room to administer IV medication. Observed pt putting fingers in mouth and towards the back of their throat until they vomited. Pt apologized and stated that they can't throw up otherwise. RN advised against doing this behavior.

## 2023-08-01 NOTE — Discharge Instructions (Addendum)
Your labs do not show any infection tonight and your vomiting has come under control. You can continue Zofran at home, and a prescription for Reglan has been sent to your pharmacy to use if Zofran is ineffective.   Please follow up with your doctor for recheck on Monday.   Return to the ED with any or concerning symptoms at any time.

## 2023-08-01 NOTE — ED Notes (Signed)
Patients ride will be here shortly with clothing for her as well

## 2023-08-02 LAB — URINE CULTURE: Culture: <10000 — AB

## 2023-08-06 LAB — CULTURE, BLOOD (ROUTINE X 2): Culture: NO GROWTH

## 2023-09-25 ENCOUNTER — Encounter: Payer: Self-pay | Admitting: Neurosurgery

## 2023-09-25 DIAGNOSIS — M4802 Spinal stenosis, cervical region: Secondary | ICD-10-CM

## 2023-09-28 ENCOUNTER — Other Ambulatory Visit: Payer: Self-pay | Admitting: Neurosurgery

## 2023-09-28 DIAGNOSIS — M4802 Spinal stenosis, cervical region: Secondary | ICD-10-CM

## 2023-10-11 ENCOUNTER — Ambulatory Visit
Admission: RE | Admit: 2023-10-11 | Discharge: 2023-10-11 | Disposition: A | Discharge status: Home / Self Care | Payer: Medicare Other | Source: Ambulatory Visit | Attending: Neurosurgery | Admitting: Neurosurgery

## 2023-10-11 DIAGNOSIS — M4802 Spinal stenosis, cervical region: Secondary | ICD-10-CM

## 2023-12-29 ENCOUNTER — Telehealth: Payer: Self-pay

## 2023-12-29 NOTE — Telephone Encounter (Signed)
 Got call back from [facility] informing our office that they canceled the pt procedure due to waiting for the Preop clearance.   Called pt and got patient scheduled for IN OFFICE preop clearance appt. Pt is scheduled for 01/11/24 with Theotis Flake, PA-C

## 2023-12-29 NOTE — Telephone Encounter (Signed)
   Pre-operative Risk Assessment    Patient Name: Stephanie Carrillo  DOB: 05-04-1956 MRN: [id number]   Date of last office visit: 01/06/23 Antoinette Batman, MD Date of next office visit: NONE   Request for Surgical Clearance    Procedure:  *AIRO 1* C3-T2 POSTERIOR INSTRUMENTED FUSION WITH C4-T1 DECOMPRESSION; ARTHRODESIS, POSTERIOR OR POSTEROLATERAL TECHNIQUE; LAMINECTOMY WITHY EXPLORATION AND/OR DECOMPRESSION OF SPINAL CORD; POSTERIOR SEGMENTAL INSTRUMENTATION   Date of Surgery:  Clearance 12/29/23  STAT                                Surgeon:  NOT INDICATED Surgeon's Group or Practice Name:  [facility] Phone number:  [phone] Fax number:  [phone]   Type of Clearance Requested:   - Medical    Type of Anesthesia:  General    Additional requests/questions:    SignedCollin Deal   12/29/2023, 7:32 AM

## 2023-12-29 NOTE — Telephone Encounter (Signed)
   Name:  Stephanie Carrillo  DOB:  13-Jul-1956  MRN:  [id number]   Primary Cardiologist: None  Chart reviewed as part of pre-operative protocol coverage. Patient was contacted 12/29/2023 in reference to pre-operative risk assessment for pending surgery as outlined below.  Stephanie Carrillo was last seen on 01/06/2024 by Dr. Abel Hoe.  Since that day, Stephanie Carrillo has done.  Stephanie Carrillo will require a follow-up visit for further pre-operative risk assessment.  Pre-op covering staff: - Please schedule appointment and call patient to inform them. If patient already had an upcoming appointment within acceptable timeframe, please add "pre-op clearance" to the appointment notes so provider is aware. - Please contact requesting surgeon's office via preferred method (i.e, phone, fax) to inform them of need for appointment prior to surgery.  Francene Ing, Retha Cast, NP 12/29/2023, 8:14 AM

## 2023-12-29 NOTE — Telephone Encounter (Signed)
 Called and spoke with resource nurse @ [phone] (corrected #) and informed them that office just got the Preop clearance this morning and that the pt is going to need an IN OFFICE appt for this Preop clearance.   Nurse stated that they will talk with the provider and give a call back after talking with them.

## 2024-01-01 NOTE — Progress Notes (Deleted)
  Cardiology Office Note:  .   Date:  01/01/2024  ID:  Stephanie Carrillo, DOB Nov 02, 1955, MRN [id number] PCP: Rey Catholic, MD  [facility] Providers Cardiologist:  None { Click to update primary MD,subspecialty MD or APP then REFRESH:1}   History of Present Illness: .   Stephanie Carrillo is a 68 y.o. female  with history of chronic pain syndrome, anxiety, depression, HTN and seizures, former smoker. Echo in November 2022 with LVEF=55-60%, normal RV function. No significant valve disease. Normal renal artery dopplers in January 2023.   Patient saw Dr. Abel Hoe Feb 16, 2023 for weakness with falls but no LOC. EKG normal. 30 day zio NSR with 4 beat NSVT, PAC, PVC's. In ED with N/V tachycardia 07/2023 resolved.  Patient here for Preop clearance  ROS: ***  Studies Reviewed: Stephanie Carrillo         Prior CV Studies: {Select studies to display:26339}   Echo 02/15/21 IMPRESSIONS     1. Left ventricular ejection fraction, by estimation, is 55 to 60%. Left  ventricular ejection fraction by 3D volume is 60 %. The left ventricle has  normal function. The left ventricle has no regional wall motion  abnormalities. Left ventricular diastolic   parameters were normal.   2. Right ventricular systolic function is normal. The right ventricular  size is normal.   3. The mitral valve is normal in structure. Trivial mitral valve  regurgitation.   4. The aortic valve is normal in structure. Aortic valve regurgitation is  not visualized. No aortic stenosis is present.    Risk Assessment/Calculations:   {Does this patient have ATRIAL FIBRILLATION?:[phone]} No BP recorded.  {Refresh Note OR Click here to enter BP  :1}***       Physical Exam:   VS:  There were no vitals taken for this visit.   Wt Readings from Last 3 Encounters:  07/31/23 162 lb (73.5 kg)  07/01/23 162 lb (73.5 kg)  04/08/23 155 lb (70.3 kg)    GEN: Well nourished, well developed in no acute distress NECK: No JVD; No carotid bruits CARDIAC:  ***RRR, no murmurs, rubs, gallops RESPIRATORY:  Clear to auscultation without rales, wheezing or rhonchi  ABDOMEN: Soft, non-tender, non-distended EXTREMITIES:  No edema; No deformity   ASSESSMENT AND PLAN: .    Preop clearance for C3-T2 POSTERIOR INSTRUMENTED FUSION WITH C4-T1 DECOMPRESSION; ARTHRODESIS, POSTERIOR OR POSTEROLATERAL TECHNIQUE; LAMINECTOMY WITHY EXPLORATION AND/OR DECOMPRESSION OF SPINAL CORD; POSTERIOR SEGMENTAL INSTRUMENTATION at [facility].  HTN  NSVT, PAC's, PVC's on metoprolol      {Are you ordering a CV Procedure (e.g. stress test, cath, DCCV, TEE, etc)?   Press F2        :[id number]}  Dispo: ***  Signed, Theotis Flake, PA-C

## 2024-01-11 ENCOUNTER — Ambulatory Visit: Attending: Internal Medicine | Admitting: Physician Assistant

## 2024-01-11 NOTE — Telephone Encounter (Signed)
 Per Stephanie Carrillo, PAC who updated our pre op team the pt did not show for her appt today for preop clearance.   The pt will need to call back and reschedule an appt in the office for preop clearance.

## 2024-02-24 NOTE — Progress Notes (Deleted)
 Cardiology Office Note    Date:  02/24/2024  ID:  Stephanie Carrillo, DOB June 18, 1956, MRN [id number] PCP:  Stephanie Sharper, MD  Cardiologist:  None  Electrophysiologist:  None   Chief Complaint: ***  History of Present Illness: .    Stephanie Carrillo is a 68 y.o. female with visit-pertinent history of chronic pain syndrome, anxiety, depression, hypertension and seizures.  Patient was first evaluated by Dr. Verlin on 06/2021 for preoperative cardiac risk assessment.  Patient previous smoked 2 packs/day for 15 years and quit around 18 years ago.  Echo in November 2022 with LVEF 55 to 60%, normal RV function, no significant valve disease.  In January 2023 she had normal renal artery Dopplers.  Patient was last seen in clinic by Dr. Verlin on 6//24, she had recently reported syncopal events.  She denied any chest pain, dyspnea, palpitations, lower extremity edema, orthopnea or PND.  Patient reported that she been having episodes of falling and did not lose consciousness.  It was noted that was not clear that she was having true syncope, she reported becoming weak and following with no loss of consciousness.  Cardiac exam was normal.  A 30-day cardiac monitor showed predominant rhythm was sinus rhythm with a 4 beat run of nonsustained ventricular tachycardia, PACs and PVCs.  Today she presents for preoperative cardiac evaluation for C3-T2 posterior instrumented fusion with C4-T1 decompression, arthrodesis, posterior or posterior lateral technique, laminectomy with exploration and/or decompression of spinal cord, posterior segmental instrumentation with Stephanie Carrillo. She reports that she   Preoperative cardiac evaluation:  {Click Here to Calculate RCRI      :789639253}  { Click Here to Calculate DASI      :789639253} {Select to add RCRI Risk (<1%=LOW; >/=1%=HIGH) (Optional):21036017}  {Select if HIGH (RCRI >/=1%) Risk (Optional):21036030} Recommendations: {2014 ACC/AHA Perioperative Guidelines   :21036001} Antiplatelet and/or Anticoagulation Recommendations: {Antiplatelet Recommendations                  :21036016} {Anticoagulation Recommendations           :78963980}   Labwork independently reviewed:   ROS: .   *** denies chest pain, shortness of breath, lower extremity edema, fatigue, palpitations, melena, hematuria, hemoptysis, diaphoresis, weakness, presyncope, syncope, orthopnea, and PND.  All other systems are reviewed and otherwise negative.  Studies Reviewed: Stephanie Carrillo    EKG:  EKG is ordered today, personally reviewed, demonstrating ***     CV Studies: Cardiac studies reviewed are outlined and summarized above. Otherwise please see EMR for full report. Cardiac Studies & Procedures   ______________________________________________________________________________________________     ECHOCARDIOGRAM  ECHOCARDIOGRAM COMPLETE 06/26/2021  Narrative ECHOCARDIOGRAM REPORT    Patient Name:   Stephanie Carrillo Date of Exam: 06/26/2021 Medical Rec #:  [id number]     Height:       66.0 in Accession #:    [id number]    Weight:       180.0 lb Date of Birth:  March 19, 1956     BSA:          1.912 m Patient Age:    65 years      BP:           86/60 mmHg Patient Gender: F             HR:           55 bpm. Exam Location:  [address]  Procedure: 2D Echo, 3D Echo, Cardiac Doppler and Color Doppler  Indications:    Z01.810 Pre-Operative Evaluation  History:        Patient has no prior history of Echocardiogram examinations. Risk Factors:Family History of Coronary Artery Disease, Hypertension and Former Smoker. History of Substance Abuse, Seizures, Pre-Operative Eval for Right Shoulder Surgery.  Sonographer:    Stephanie Carrillo RDCS Referring Phys: Stephanie Carrillo  IMPRESSIONS   1. Left ventricular ejection fraction, by estimation, is 55 to 60%. Left ventricular ejection fraction by 3D volume is 60 %. The left ventricle has normal function. The left ventricle has no  regional wall motion abnormalities. Left ventricular diastolic parameters were normal. 2. Right ventricular systolic function is normal. The right ventricular size is normal. 3. The mitral valve is normal in structure. Trivial mitral valve regurgitation. 4. The aortic valve is normal in structure. Aortic valve regurgitation is not visualized. No aortic stenosis is present.  FINDINGS Left Ventricle: Left ventricular ejection fraction, by estimation, is 55 to 60%. Left ventricular ejection fraction by 3D volume is 60 %. The left ventricle has normal function. The left ventricle has no regional wall motion abnormalities. The left ventricular internal cavity size was normal in size. There is no left ventricular hypertrophy. Left ventricular diastolic parameters were normal.  Right Ventricle: The right ventricular size is normal. Right vetricular wall thickness was not well visualized. Right ventricular systolic function is normal.  Left Atrium: Left atrial size was normal in size.  Right Atrium: Right atrial size was normal in size.  Pericardium: There is no evidence of pericardial effusion.  Mitral Valve: The mitral valve is normal in structure. Trivial mitral valve regurgitation.  Tricuspid Valve: The tricuspid valve is grossly normal. Tricuspid valve regurgitation is trivial.  Aortic Valve: The aortic valve is normal in structure. Aortic valve regurgitation is not visualized. No aortic stenosis is present.  Pulmonic Valve: The pulmonic valve was normal in structure. Pulmonic valve regurgitation is not visualized.  Aorta: The aortic root and ascending aorta are structurally normal, with no evidence of dilitation.  IAS/Shunts: The atrial septum is grossly normal.   LEFT VENTRICLE PLAX 2D LVIDd:         4.80 cm         Diastology LVIDs:         2.80 cm         LV e' medial:    8.05 cm/s LV PW:         0.80 cm         LV E/e' medial:  12.9 LV IVS:        0.70 cm         LV e' lateral:    11.60 cm/s LVOT diam:     2.10 cm         LV E/e' lateral: 8.9 LV SV:         69 LV SV Index:   36 LVOT Area:     3.46 cm        3D Volume EF LV 3D EF:    Left ventricul ar ejection fraction by 3D volume is 60 %.  3D Volume EF: 3D EF:        60 % LV EDV:       108 ml LV ESV:       43 ml LV SV:        65 ml  RIGHT VENTRICLE RV S prime:     11.10 cm/s TAPSE (M-mode): 2.6 cm  LEFT ATRIUM             Index  RIGHT ATRIUM           Index LA diam:        3.80 cm 1.99 cm/m   RA Area:     17.30 cm LA Vol (A2C):   39.1 ml 20.45 ml/m  RA Volume:   43.30 ml  22.64 ml/m LA Vol (A4C):   54.6 ml 28.55 ml/m LA Biplane Vol: 48.8 ml 25.52 ml/m AORTIC VALVE LVOT Vmax:   84.60 cm/s LVOT Vmean:  56.850 cm/s LVOT VTI:    0.200 m  AORTA Ao Root diam: 2.80 cm Ao Asc diam:  3.00 cm  MITRAL VALVE MV Area (PHT): cm          SHUNTS MV Decel Time: 196 msec     Systemic VTI:  0.20 m MV E velocity: 103.50 cm/s  Systemic Diam: 2.10 cm MV A velocity: 82.55 cm/s MV E/A ratio:  1.25  Aleene Passe MD Electronically signed by Aleene Passe MD Signature Date/Time: 06/26/2021/4:08:40 PM    Final    MONITORS  CARDIAC EVENT MONITOR 02/25/2023  Narrative Sinus rhythm. 4 beat run of non-sustained ventricular tachycardia Premature atrial contractions (807) Premature ventricular contractions (104)       ______________________________________________________________________________________________       Current Reported Medications:.    No outpatient medications have been marked as taking for the 02/26/24 encounter (Appointment) with Azan Maneri D, NP.    Physical Exam:    VS:  There were no vitals taken for this visit.   Wt Readings from Last 3 Encounters:  07/31/23 162 lb (73.5 kg)  07/01/23 162 lb (73.5 kg)  04/08/23 155 lb (70.3 kg)    GEN: Well nourished, well developed in no acute distress NECK: No JVD; No carotid bruits CARDIAC: ***RRR, no murmurs, rubs,  gallops RESPIRATORY:  Clear to auscultation without rales, wheezing or rhonchi  ABDOMEN: Soft, non-tender, non-distended EXTREMITIES:  No edema; No acute deformity     Asessement and Plan:.     ***     Disposition: F/u with ***  Signed, Kelicia Youtz D Bj Morlock, NP

## 2024-02-26 ENCOUNTER — Ambulatory Visit: Admitting: Cardiology

## 2024-02-26 DIAGNOSIS — Z01810 Encounter for preprocedural cardiovascular examination: Secondary | ICD-10-CM

## 2024-03-20 NOTE — Progress Notes (Deleted)
 Cardiology Office Note    Date:  03/20/2024  ID:  Stephanie Carrillo, DOB [birth year], MRN [id number] PCP:  Hughie Sharper, MD  Cardiologist:  None  Electrophysiologist:  None    History of Present Illness: .    Stephanie Carrillo is a 68 y.o. female with visit-pertinent history of chronic pain syndrome, anxiety, depression, hypertension and seizures.  Patient was first evaluated by Dr. Verlin on 06/2021 for preoperative cardiac risk assessment.  Patient previous smoked 2 packs/day for 15 years and quit around 18 years ago.  Echo in November 2022 with LVEF 55 to 60%, normal RV function, no significant valve disease.  In January 2023 she had normal renal artery Dopplers.  Patient was last seen in clinic by Dr. Verlin on 6//24, she had recently reported syncopal events.  She denied any chest pain, dyspnea, palpitations, lower extremity edema, orthopnea or PND.  Patient reported that she been having episodes of falling and did not lose consciousness.  It was noted that was not clear that she was having true syncope, she reported becoming weak and following with no loss of consciousness.  Cardiac exam was normal.  A 30-day cardiac monitor showed predominant rhythm was sinus rhythm with a 4 beat run of nonsustained ventricular tachycardia, PACs and PVCs.  Today she presents for preoperative cardiac evaluation for C3-T2 posterior instrumented fusion with C4-T1 decompression, arthrodesis, posterior or posterior lateral technique, laminectomy with exploration and/or decompression of spinal cord, posterior segmental instrumentation with [employer]. She reports that she   Preoperative cardiac evaluation:    Labwork independently reviewed:   ROS: .   *** denies chest pain, shortness of breath, lower extremity edema, fatigue, palpitations, melena, hematuria, hemoptysis, diaphoresis, weakness, presyncope, syncope, orthopnea, and PND.  All other systems are reviewed and otherwise negative.  Studies  Reviewed: SABRA    EKG:  EKG is ordered today, personally reviewed, demonstrating ***     CV Studies: Cardiac studies reviewed are outlined and summarized above. Otherwise please see EMR for full report. Cardiac Studies & Procedures   ______________________________________________________________________________________________     ECHOCARDIOGRAM  ECHOCARDIOGRAM COMPLETE 06/26/2021  Narrative ECHOCARDIOGRAM REPORT    Patient Name:   Stephanie Carrillo Date of Exam: 06/26/2021 Medical Rec #:  [id number]     Height:       66.0 in Accession #:    [id number]    Weight:       180.0 lb Date of Birth:  [birth year]     BSA:          1.912 m Patient Age:    65 years      BP:           86/60 mmHg Patient Gender: F             HR:           55 bpm. Exam Location:  [address]  Procedure: 2D Echo, 3D Echo, Cardiac Doppler and Color Doppler  Indications:    Z01.810 Pre-Operative Evaluation  History:        Patient has no prior history of Echocardiogram examinations. Risk Factors:Family History of Coronary Artery Disease, Hypertension and Former Smoker. History of Substance Abuse, Seizures, Pre-Operative Eval for Right Shoulder Surgery.  Sonographer:    Heather Hawks RDCS Referring Phys: LONNI JONETTA VERLIN  IMPRESSIONS   1. Left ventricular ejection fraction, by estimation, is 55 to 60%. Left ventricular ejection fraction by 3D volume is 60 %. The left ventricle has normal function. The left ventricle has  no regional wall motion abnormalities. Left ventricular diastolic parameters were normal. 2. Right ventricular systolic function is normal. The right ventricular size is normal. 3. The mitral valve is normal in structure. Trivial mitral valve regurgitation. 4. The aortic valve is normal in structure. Aortic valve regurgitation is not visualized. No aortic stenosis is present.  FINDINGS Left Ventricle: Left ventricular ejection fraction, by estimation, is 55 to 60%. Left ventricular  ejection fraction by 3D volume is 60 %. The left ventricle has normal function. The left ventricle has no regional wall motion abnormalities. The left ventricular internal cavity size was normal in size. There is no left ventricular hypertrophy. Left ventricular diastolic parameters were normal.  Right Ventricle: The right ventricular size is normal. Right vetricular wall thickness was not well visualized. Right ventricular systolic function is normal.  Left Atrium: Left atrial size was normal in size.  Right Atrium: Right atrial size was normal in size.  Pericardium: There is no evidence of pericardial effusion.  Mitral Valve: The mitral valve is normal in structure. Trivial mitral valve regurgitation.  Tricuspid Valve: The tricuspid valve is grossly normal. Tricuspid valve regurgitation is trivial.  Aortic Valve: The aortic valve is normal in structure. Aortic valve regurgitation is not visualized. No aortic stenosis is present.  Pulmonic Valve: The pulmonic valve was normal in structure. Pulmonic valve regurgitation is not visualized.  Aorta: The aortic root and ascending aorta are structurally normal, with no evidence of dilitation.  IAS/Shunts: The atrial septum is grossly normal.   LEFT VENTRICLE PLAX 2D LVIDd:         4.80 cm         Diastology LVIDs:         2.80 cm         LV e' medial:    8.05 cm/s LV PW:         0.80 cm         LV E/e' medial:  12.9 LV IVS:        0.70 cm         LV e' lateral:   11.60 cm/s LVOT diam:     2.10 cm         LV E/e' lateral: 8.9 LV SV:         69 LV SV Index:   36 LVOT Area:     3.46 cm        3D Volume EF LV 3D EF:    Left ventricul ar ejection fraction by 3D volume is 60 %.  3D Volume EF: 3D EF:        60 % LV EDV:       108 ml LV ESV:       43 ml LV SV:        65 ml  RIGHT VENTRICLE RV S prime:     11.10 cm/s TAPSE (M-mode): 2.6 cm  LEFT ATRIUM             Index        RIGHT ATRIUM           Index LA diam:        3.80  cm 1.99 cm/m   RA Area:     17.30 cm LA Vol (A2C):   39.1 ml 20.45 ml/m  RA Volume:   43.30 ml  22.64 ml/m LA Vol (A4C):   54.6 ml 28.55 ml/m LA Biplane Vol: 48.8 ml 25.52 ml/m AORTIC VALVE LVOT Vmax:   84.60 cm/s LVOT  Vmean:  56.850 cm/s LVOT VTI:    0.200 m  AORTA Ao Root diam: 2.80 cm Ao Asc diam:  3.00 cm  MITRAL VALVE MV Area (PHT): cm          SHUNTS MV Decel Time: 196 msec     Systemic VTI:  0.20 m MV E velocity: 103.50 cm/s  Systemic Diam: 2.10 cm MV A velocity: 82.55 cm/s MV E/A ratio:  1.25  Aleene Passe MD Electronically signed by Aleene Passe MD Signature Date/Time: 06/26/2021/4:08:40 PM    Final    MONITORS  CARDIAC EVENT MONITOR 02/25/2023  Narrative Sinus rhythm. 4 beat run of non-sustained ventricular tachycardia Premature atrial contractions (807) Premature ventricular contractions (104)       ______________________________________________________________________________________________       Current Reported Medications:.    No outpatient medications have been marked as taking for the 03/22/24 encounter (Appointment) with Lucien Orren SAILOR, PA-C.    Physical Exam:    VS:  There were no vitals taken for this visit.   Wt Readings from Last 3 Encounters:  07/31/23 162 lb (73.5 kg)  07/01/23 162 lb (73.5 kg)  04/08/23 155 lb (70.3 kg)    GEN: Well nourished, well developed in no acute distress NECK: No JVD; No carotid bruits CARDIAC: ***RRR, no murmurs, rubs, gallops RESPIRATORY:  Clear to auscultation without rales, wheezing or rhonchi  ABDOMEN: Soft, non-tender, non-distended EXTREMITIES:  No edema; No acute deformity     Asessement and Plan:.     ***     Disposition: F/u with ***  Signed, Orren SAILOR Lucien, PA-C

## 2024-03-22 ENCOUNTER — Ambulatory Visit: Attending: Physician Assistant | Admitting: Physician Assistant

## 2024-03-22 DIAGNOSIS — I10 Essential (primary) hypertension: Secondary | ICD-10-CM

## 2024-03-22 DIAGNOSIS — R569 Unspecified convulsions: Secondary | ICD-10-CM

## 2024-03-22 DIAGNOSIS — Z01810 Encounter for preprocedural cardiovascular examination: Secondary | ICD-10-CM

## 2024-05-13 ENCOUNTER — Emergency Department (HOSPITAL_COMMUNITY)

## 2024-05-13 ENCOUNTER — Encounter (HOSPITAL_COMMUNITY): Payer: Self-pay | Admitting: Radiology

## 2024-05-13 ENCOUNTER — Other Ambulatory Visit: Payer: Self-pay

## 2024-05-13 ENCOUNTER — Inpatient Hospital Stay (HOSPITAL_COMMUNITY)
Admission: EM | Admit: 2024-05-13 | Discharge: 2024-06-04 | DRG: 871 | Disposition: E | Attending: Internal Medicine | Admitting: Internal Medicine

## 2024-05-13 DIAGNOSIS — Z79890 Hormone replacement therapy (postmenopausal): Secondary | ICD-10-CM

## 2024-05-13 DIAGNOSIS — Z8249 Family history of ischemic heart disease and other diseases of the circulatory system: Secondary | ICD-10-CM

## 2024-05-13 DIAGNOSIS — R471 Dysarthria and anarthria: Secondary | ICD-10-CM | POA: Diagnosis present

## 2024-05-13 DIAGNOSIS — G9589 Other specified diseases of spinal cord: Secondary | ICD-10-CM | POA: Diagnosis present

## 2024-05-13 DIAGNOSIS — Z8701 Personal history of pneumonia (recurrent): Secondary | ICD-10-CM

## 2024-05-13 DIAGNOSIS — G9341 Metabolic encephalopathy: Secondary | ICD-10-CM | POA: Diagnosis present

## 2024-05-13 DIAGNOSIS — R17 Unspecified jaundice: Secondary | ICD-10-CM | POA: Diagnosis present

## 2024-05-13 DIAGNOSIS — R1310 Dysphagia, unspecified: Secondary | ICD-10-CM | POA: Diagnosis present

## 2024-05-13 DIAGNOSIS — I160 Hypertensive urgency: Secondary | ICD-10-CM | POA: Diagnosis present

## 2024-05-13 DIAGNOSIS — G894 Chronic pain syndrome: Secondary | ICD-10-CM | POA: Diagnosis present

## 2024-05-13 DIAGNOSIS — R011 Cardiac murmur, unspecified: Secondary | ICD-10-CM | POA: Diagnosis present

## 2024-05-13 DIAGNOSIS — Z880 Allergy status to penicillin: Secondary | ICD-10-CM

## 2024-05-13 DIAGNOSIS — Z1152 Encounter for screening for COVID-19: Secondary | ICD-10-CM

## 2024-05-13 DIAGNOSIS — I10 Essential (primary) hypertension: Secondary | ICD-10-CM | POA: Diagnosis present

## 2024-05-13 DIAGNOSIS — Z79891 Long term (current) use of opiate analgesic: Secondary | ICD-10-CM

## 2024-05-13 DIAGNOSIS — M542 Cervicalgia: Secondary | ICD-10-CM | POA: Diagnosis present

## 2024-05-13 DIAGNOSIS — E869 Volume depletion, unspecified: Secondary | ICD-10-CM | POA: Diagnosis present

## 2024-05-13 DIAGNOSIS — Z79899 Other long term (current) drug therapy: Secondary | ICD-10-CM

## 2024-05-13 DIAGNOSIS — G2581 Restless legs syndrome: Secondary | ICD-10-CM | POA: Diagnosis present

## 2024-05-13 DIAGNOSIS — Z8661 Personal history of infections of the central nervous system: Secondary | ICD-10-CM

## 2024-05-13 DIAGNOSIS — R0681 Apnea, not elsewhere classified: Secondary | ICD-10-CM | POA: Diagnosis not present

## 2024-05-13 DIAGNOSIS — J9811 Atelectasis: Secondary | ICD-10-CM | POA: Diagnosis present

## 2024-05-13 DIAGNOSIS — N3946 Mixed incontinence: Secondary | ICD-10-CM | POA: Diagnosis present

## 2024-05-13 DIAGNOSIS — N3000 Acute cystitis without hematuria: Principal | ICD-10-CM

## 2024-05-13 DIAGNOSIS — G43909 Migraine, unspecified, not intractable, without status migrainosus: Secondary | ICD-10-CM | POA: Diagnosis present

## 2024-05-13 DIAGNOSIS — R6521 Severe sepsis with septic shock: Secondary | ICD-10-CM | POA: Diagnosis present

## 2024-05-13 DIAGNOSIS — R6520 Severe sepsis without septic shock: Secondary | ICD-10-CM | POA: Diagnosis present

## 2024-05-13 DIAGNOSIS — R569 Unspecified convulsions: Secondary | ICD-10-CM | POA: Diagnosis not present

## 2024-05-13 DIAGNOSIS — A419 Sepsis, unspecified organism: Secondary | ICD-10-CM | POA: Diagnosis present

## 2024-05-13 DIAGNOSIS — Z66 Do not resuscitate: Secondary | ICD-10-CM | POA: Diagnosis present

## 2024-05-13 DIAGNOSIS — I129 Hypertensive chronic kidney disease with stage 1 through stage 4 chronic kidney disease, or unspecified chronic kidney disease: Secondary | ICD-10-CM | POA: Diagnosis present

## 2024-05-13 DIAGNOSIS — Z87891 Personal history of nicotine dependence: Secondary | ICD-10-CM

## 2024-05-13 DIAGNOSIS — A0472 Enterocolitis due to Clostridium difficile, not specified as recurrent: Secondary | ICD-10-CM | POA: Diagnosis present

## 2024-05-13 DIAGNOSIS — A412 Sepsis due to unspecified staphylococcus: Principal | ICD-10-CM | POA: Diagnosis present

## 2024-05-13 DIAGNOSIS — R4182 Altered mental status, unspecified: Secondary | ICD-10-CM

## 2024-05-13 DIAGNOSIS — R112 Nausea with vomiting, unspecified: Secondary | ICD-10-CM | POA: Diagnosis not present

## 2024-05-13 DIAGNOSIS — F32A Depression, unspecified: Secondary | ICD-10-CM | POA: Diagnosis present

## 2024-05-13 DIAGNOSIS — Z515 Encounter for palliative care: Secondary | ICD-10-CM

## 2024-05-13 DIAGNOSIS — E785 Hyperlipidemia, unspecified: Secondary | ICD-10-CM | POA: Diagnosis present

## 2024-05-13 DIAGNOSIS — D649 Anemia, unspecified: Secondary | ICD-10-CM | POA: Diagnosis present

## 2024-05-13 DIAGNOSIS — Z87440 Personal history of urinary (tract) infections: Secondary | ICD-10-CM

## 2024-05-13 DIAGNOSIS — M1990 Unspecified osteoarthritis, unspecified site: Secondary | ICD-10-CM | POA: Diagnosis present

## 2024-05-13 DIAGNOSIS — E872 Acidosis, unspecified: Secondary | ICD-10-CM | POA: Diagnosis present

## 2024-05-13 DIAGNOSIS — N390 Urinary tract infection, site not specified: Secondary | ICD-10-CM | POA: Diagnosis present

## 2024-05-13 DIAGNOSIS — N182 Chronic kidney disease, stage 2 (mild): Secondary | ICD-10-CM | POA: Diagnosis present

## 2024-05-13 DIAGNOSIS — F419 Anxiety disorder, unspecified: Secondary | ICD-10-CM | POA: Diagnosis present

## 2024-05-13 DIAGNOSIS — E039 Hypothyroidism, unspecified: Secondary | ICD-10-CM | POA: Diagnosis present

## 2024-05-13 DIAGNOSIS — K219 Gastro-esophageal reflux disease without esophagitis: Secondary | ICD-10-CM | POA: Diagnosis present

## 2024-05-13 DIAGNOSIS — R233 Spontaneous ecchymoses: Secondary | ICD-10-CM | POA: Diagnosis not present

## 2024-05-13 LAB — CBC WITH DIFFERENTIAL/PLATELET
Abs Immature Granulocytes: 0.06 K/uL (ref 0.00–0.07)
Basophils Absolute: 0 K/uL (ref 0.0–0.1)
Basophils Relative: 0 %
Eosinophils Absolute: 0 K/uL (ref 0.0–0.5)
Eosinophils Relative: 0 %
HCT: 45.2 % (ref 36.0–46.0)
Hemoglobin: 15 g/dL (ref 12.0–15.0)
Immature Granulocytes: 0 %
Lymphocytes Relative: 3 %
Lymphs Abs: 0.6 K/uL — ABNORMAL LOW (ref 0.7–4.0)
MCH: 33.3 pg (ref 26.0–34.0)
MCHC: 33.2 g/dL (ref 30.0–36.0)
MCV: 100.4 fL — ABNORMAL HIGH (ref 80.0–100.0)
Monocytes Absolute: 0.6 K/uL (ref 0.1–1.0)
Monocytes Relative: 4 %
Neutro Abs: 15.9 K/uL — ABNORMAL HIGH (ref 1.7–7.7)
Neutrophils Relative %: 93 %
Platelets: 440 K/uL — ABNORMAL HIGH (ref 150–400)
RBC: 4.5 MIL/uL (ref 3.87–5.11)
RDW: 12.1 % (ref 11.5–15.5)
WBC: 17.2 K/uL — ABNORMAL HIGH (ref 4.0–10.5)
nRBC: 0 % (ref 0.0–0.2)

## 2024-05-13 LAB — COMPREHENSIVE METABOLIC PANEL WITH GFR
ALT: 14 U/L (ref 0–44)
AST: 31 U/L (ref 15–41)
Albumin: 5.1 g/dL — ABNORMAL HIGH (ref 3.5–5.0)
Alkaline Phosphatase: 118 U/L (ref 38–126)
Anion gap: 19 — ABNORMAL HIGH (ref 5–15)
BUN: 18 mg/dL (ref 8–23)
CO2: 19 mmol/L — ABNORMAL LOW (ref 22–32)
Calcium: 11.1 mg/dL — ABNORMAL HIGH (ref 8.9–10.3)
Chloride: 99 mmol/L (ref 98–111)
Creatinine, Ser: 0.78 mg/dL (ref 0.44–1.00)
GFR, Estimated: >60 mL/min (ref >60)
Glucose, Bld: 202 mg/dL — ABNORMAL HIGH (ref 70–99)
Potassium: 4.4 mmol/L (ref 3.5–5.1)
Sodium: 136 mmol/L (ref 135–145)
Total Bilirubin: 0.8 mg/dL (ref 0.0–1.2)
Total Protein: 9.6 g/dL — ABNORMAL HIGH (ref 6.5–8.1)

## 2024-05-13 LAB — I-STAT CG4 LACTIC ACID, ED: Lactic Acid, Venous: 3.4 mmol/L (ref 0.5–1.9)

## 2024-05-13 MED ORDER — IOHEXOL 350 MG/ML SOLN
75.0000 mL | Freq: Once | INTRAVENOUS | Status: AC | PRN
Start: 2024-05-13 — End: 2024-05-13
  Administered 2024-05-13: 75 mL via INTRAVENOUS

## 2024-05-13 MED ORDER — ONDANSETRON HCL 4 MG/2ML IJ SOLN
4.0000 mg | Freq: Once | INTRAMUSCULAR | Status: AC
  Administered 2024-05-13: 4 mg via INTRAVENOUS
  Filled 2024-05-13: qty 2

## 2024-05-13 MED ORDER — LACTATED RINGERS IV BOLUS (SEPSIS)
1000.0000 mL | Freq: Once | INTRAVENOUS | Status: AC
  Administered 2024-05-13: 1000 mL via INTRAVENOUS

## 2024-05-13 MED ORDER — SODIUM CHLORIDE 0.9 % IV SOLN: 2.0000 g | Freq: Once | INTRAVENOUS | Status: DC

## 2024-05-13 MED ORDER — LACTATED RINGERS IV BOLUS
1000.0000 mL | Freq: Once | INTRAVENOUS | Status: AC
  Administered 2024-05-13: 1000 mL via INTRAVENOUS

## 2024-05-13 MED ORDER — SODIUM CHLORIDE 0.9 % IV SOLN
2.0000 g | Freq: Once | INTRAVENOUS | Status: DC
  Administered 2024-05-13: 2 g via INTRAVENOUS
  Filled 2024-05-13: qty 20

## 2024-05-13 NOTE — Progress Notes (Signed)
 ED Pharmacy Antibiotic Sign Off An antibiotic consult was received from an ED provider for Cefepime  per pharmacy dosing for bacteremia. A chart review was completed to assess appropriateness.   >Pt recently reecived 4d IV Cefepime  at [facility] and was discharged home on nitrofurantoin  thru 10/1 for MDR Ecoli UTI.   The following one time order(s) were placed:  Cefepime  2gm IV  Further antibiotic and/or antibiotic pharmacy consults should be ordered by the admitting provider if indicated.   Thank you for allowing pharmacy to be a part of this patient's care.   Rosaline Millet, [facility]  Clinical Pharmacist 05/13/24 11:08 PM

## 2024-05-13 NOTE — ED Provider Notes (Addendum)
 [place] EMERGENCY DEPARTMENT AT [facility] Provider Note   CSN: [id number] Arrival date & time: 05/13/24  1946     History  Chief Complaint  Patient presents with   Emesis    Stephanie Carrillo is a 68 y.o. female with PMH as listed below who presents with nausea/vomiting, recent UTI. Pt came home from [facility] 3 days ago after being treated for a UTI. Today she began vomiting around 3am and it has persisted all day. She has not been able to take her meds due to continuous vomiting. She endorses abdominal pain. She is a poor historian and is noted to be altered on arrival, A&Ox1.  Level 5 caveat.  Recently treated for E coli resistant to ceftriaxone  during an admission to [facility] from 04/27/2024 to 05/01/2024. She also had stroke-like sxs at that time of dysarthria as well as worsening balance and gait that was determined not to be d/t stroke and had resolved on discharge.  Other active problems during that admission include chronic pain syndrome, dysarthria, dysphagia, cervical myelopathy, mixed stress and urge urinary incontinence, hypothyroidism.  She was initially treated with IV cefepime  and discharged on remaining 3 days of Macrobid .  On arrival to the emergency department patient is tachycardic,    Past Medical History:  Diagnosis Date   Anxiety    Arthritis    Chronic kidney disease    Depression    GERD (gastroesophageal reflux disease)    Heart murmur    Hypertension    Insomnia    Meningitis    Pneumonia    Recurrent UTI    Seizures (HCC)    Substance abuse (HCC)        Home Medications Prior to Admission medications   Medication Sig Start Date End Date Taking? Authorizing Provider  acetaminophen  (TYLENOL ) 500 MG tablet Take 2 tablets (1,000 mg total) by mouth every 6 (six) hours as needed. 04/05/21   Drusilla Sabas RAMAN, MD  buprenorphine  (SUBUTEX ) 2 MG SUBL SL tablet Place 2 mg under the tongue 2 (two) times daily. 07/02/21   [provider]   clonazePAM  (KLONOPIN ) 0.5 MG tablet Take 1 tablet (0.5 mg total) by mouth 3 (three) times daily as needed for anxiety. 04/15/21   Barbarann Oneil BROCKS, MD  cyanocobalamin  (VITAMIN B12) 100 MCG tablet Take 100 mcg by mouth daily.    [provider]  diphenhydrAMINE  (BENADRYL ) 50 MG tablet Take 150 mg by mouth at bedtime as needed.    [provider]  FLUoxetine  (PROZAC ) 40 MG capsule Take 40 mg by mouth daily.    [provider]  gabapentin  (NEURONTIN ) 800 MG tablet Take 800 mg by mouth 3 (three) times daily.    [provider]  HYDROcodone -acetaminophen  (NORCO) 10-325 MG tablet Take 1 tablet by mouth in the morning, at noon, in the evening, and at bedtime. 06/04/21   [provider]  levothyroxine (SYNTHROID) 50 MCG tablet Take 50 mcg by mouth daily.    [provider]  lisinopril  (ZESTRIL ) 10 MG tablet Take 10 mg by mouth as needed. Taking when blood pressure is 160's (abnormal will take) 04/20/21   [provider]  magnesium  gluconate (MAGONATE) 500 MG tablet Take 500 mg by mouth daily.    [provider]  metoCLOPramide  (REGLAN ) 10 MG tablet Take 1 tablet (10 mg total) by mouth every 6 (six) hours. 08/01/23   Odell Balls, PA-C  metoprolol  tartrate (LOPRESSOR ) 100 MG tablet Take 1 tablet (100 mg total) by  mouth 2 (two) times daily. 04/05/21   Drusilla Sabas RAMAN, MD  Multiple Vitamins-Minerals (CENTRUM SILVER 50+WOMEN) TABS Take 1 tablet by mouth daily.    [provider]  nitrofurantoin , macrocrystal-monohydrate, (MACROBID ) 100 MG capsule Take 100 mg by mouth 2 (two) times daily. 06/08/23   [provider]  ondansetron  (ZOFRAN -ODT) 4 MG disintegrating tablet Dissolve 1 tablet under the tongue every 8 (eight) hours as needed. 07/01/23   Jerrol Agent, MD  oxyCODONE-acetaminophen  (PERCOCET/ROXICET) 5-325 MG tablet Take 1 tablet by mouth every 6 (six) hours as needed. 06/13/23   [provider]  pantoprazole   (PROTONIX ) 40 MG tablet Take 1 tablet (40 mg total) by mouth daily at 6 (six) AM. 10/08/20   Sebastian Toribio GAILS, MD  rosuvastatin  (CRESTOR ) 10 MG tablet Take 10 mg by mouth at bedtime. 07/02/21   [provider]  SUMAtriptan (IMITREX) 100 MG tablet Take 100 mg by mouth once. 06/03/23   [provider]  tiZANidine  (ZANAFLEX ) 4 MG tablet Take 4 mg by mouth 3 (three) times daily as needed.    [provider]      Allergies    Ciprofloxacin, Penicillins, and Oxycodone    Review of Systems   Review of Systems A 10 point review of systems was performed and is negative unless otherwise reported in HPI.  Physical Exam Updated Vital Signs BP 132/66   Pulse (!) 111   Temp 98 F (36.7 C)   Resp 20   Ht 5' 6 (1.676 m)   Wt 79.4 kg   SpO2 96%   BMI 28.25 kg/m  Physical Exam General: Ill appearing female, lying in bed.  HEENT: PERRLA, EOMI, no nystagmus, Sclera anicteric, MMM, trachea midline. Tongue protrudes midline.  Cardiology: Regular tachycardic rate, no murmurs/rubs/gallops.  Resp: Normal respiratory rate and effort. CTAB, no wheezes, rhonchi, crackles.  Abd: Soft, non-tender, non-distended. No rebound tenderness or guarding.  GU: Deferred. MSK: No peripheral edema or signs of trauma. Extremities without deformity or TTP. No cyanosis or clubbing. Skin: warm, dry.  Back: No CVA tenderness Neuro: A&Ox1, CNs II-XII grossly intact.  5 out of 5 strength in all extremities. Sensation grossly intact.  Mild dysarthria and word finding difficulty noted.   ED Results / Procedures / Treatments   Labs (all labs ordered are listed, but only abnormal results are displayed) Labs Reviewed  COMPREHENSIVE METABOLIC PANEL WITH GFR - Abnormal; Notable for the following components:      Result Value   CO2 19 (*)    Glucose, Bld 202 (*)    Calcium  11.1 (*)    Total Protein 9.6 (*)    Albumin 5.1 (*)    Anion gap 19 (*)    All other components within normal limits  CBC  WITH DIFFERENTIAL/PLATELET - Abnormal; Notable for the following components:   WBC 17.2 (*)    MCV 100.4 (*)    Platelets 440 (*)    Neutro Abs 15.9 (*)    Lymphs Abs 0.6 (*)    All other components within normal limits  I-STAT CG4 LACTIC ACID, ED - Abnormal; Notable for the following components:   Lactic Acid, Venous 3.4 (*)    All other components within normal limits  CULTURE, BLOOD (ROUTINE X 2)  CULTURE, BLOOD (ROUTINE X 2)  RESP PANEL BY RT-PCR (RSV, FLU A&B, COVID)  RVPGX2  URINALYSIS, W/ REFLEX TO CULTURE (INFECTION SUSPECTED)  LIPASE, BLOOD  PROTIME-INR  I-STAT CG4 LACTIC ACID, ED    EKG EKG Interpretation  Date/Time:  Friday May 13 2024 20:12:19 EDT Ventricular Rate:  118 PR Interval:  170 QRS Duration:  71 QT Interval:  315 QTC Calculation: 442 R Axis:   50  Text Interpretation: Sinus tachycardia Confirmed by Franklyn Gills [phone]) on 05/13/2024 8:39:02 PM  Radiology DG Chest Port 1 View Result Date: 05/13/2024 CLINICAL DATA:  Question of sepsis to evaluate for abnormality. Patient was treated for urinary tract infection at [facility] 3 days ago. Today vomiting. Abdominal pain. EXAM: PORTABLE CHEST 1 VIEW COMPARISON:  April 08, 2023 FINDINGS: Heart size and pulmonary vascularity are normal for technique. Linear atelectasis in the lung bases. No airspace disease or consolidation. No pleural effusion or pneumothorax. Mediastinal contours appear intact. Postoperative changes in the cervical spine. Degenerative changes in the thoracic spine and shoulders. IMPRESSION: Linear atelectasis in the lung bases.  No focal consolidation. Electronically Signed   By: Elsie Gravely M.D.   On: 05/13/2024 22:31    Procedures .Critical Care  Performed by: Franklyn Gills SAILOR, MD Authorized by: Franklyn Gills SAILOR, MD   Critical care provider statement:    Critical care time (minutes):  45   Critical care was necessary to treat or prevent imminent or life-threatening deterioration of the  following conditions:  Sepsis   Critical care was time spent personally by me on the following activities:  Development of treatment plan with patient or surrogate, discussions with consultants, evaluation of patient's response to treatment, examination of patient, ordering and review of laboratory studies, ordering and review of radiographic studies, ordering and performing treatments and interventions, pulse oximetry, re-evaluation of patient's condition, review of old charts and obtaining history from patient or surrogate   Care discussed with: admitting provider       Medications Ordered in ED Medications  cefTRIAXone  (ROCEPHIN ) 2 g in sodium chloride  0.9 % 100 mL IVPB (2 g Intravenous New Bag/Given 05/13/24 2238)  lactated ringers  bolus 1,000 mL (1,000 mLs Intravenous New Bag/Given 05/13/24 2238)  lactated ringers  bolus 1,000 mL (1,000 mLs Intravenous New Bag/Given 05/13/24 2103)  ondansetron  (ZOFRAN ) injection 4 mg (4 mg Intravenous Given 05/13/24 2103)    ED Course/ Medical Decision Making/ A&P                          Medical Decision Making Amount and/or Complexity of Data Reviewed Labs: ordered. Decision-making details documented in ED Course. Radiology: ordered. Decision-making details documented in ED Course.  Risk Prescription drug management. Decision regarding hospitalization.    This patient presents to the ED for concern of nausea vomiting, this involves an extensive number of treatment options, and is a complaint that carries with it a high risk of complications and morbidity.  I considered the following differential and admission for this acute, potentially life threatening condition.   MDM:    Patient presents with tachycardia, fever, recent admission for MDR E. coli UTI (culture sensitive to ceftriaxone  and nitrofurantoin ) discharged on nitrofurantoin , nausea vomiting, and dysarthria/word finding difficulty consistent with prior presentation when she had UTI.   Code sepsis is called, blood cultures drawn, and patient is given IV fluids and IV antibiotics.  Leukocytosis 17.2 and elevated lactic acidosis 3.4 with anion gap 19 indicate severe sepsis.  Her blood pressure is currently stable.  I called her brother who has not seen the patient but reportedly she was last normal this morning and recalls her dysarthria on admission to [facility] as well. Given the dysarthria and altered mental status obtained  a CTA angio head and neck which was normal with no ICH or large vessel occlusion.  This is consistent with her prior presentation of UTI and do not believe it is due to stroke but could consider doing another MRI inpatient.  Chest x-ray without any focal consolidation. No significant electrolyte derangements or renal injury. No anemia. UA pending. RN only able to obtain 1 set of blood cultures but will attempt second set.   Clinical Course as of 05/14/24 0006  Fri May 13, 2024  2257 WBC(!): 17.2 Leukocytosis  [HN]  2257 Lactic Acid, Venous(!!): 3.4 [HN]  2257 Anion gap(!): 19 [HN]  2257 DG Chest Port 1 View Linear atelectasis in the lung bases.  No focal consolidation. [HN]  2340 CT ANGIO HEAD NECK W WO CM 1. No evidence of acute intracranial abnormality. 2. No large vessel occlusion or proximally significant stenosis.   [HN]    Clinical Course User Index [HN] Franklyn Sid SAILOR, MD    Labs: I Ordered, and personally interpreted labs.  The pertinent results include:  those listed above  Imaging Studies ordered: I ordered imaging studies including CTH, CTA H&N, CXR I independently visualized and interpreted imaging. I agree with the radiologist interpretation  Additional history obtained from chart review, brother Camellia Louder over the phone.  External records from outside source obtained and reviewed including [facility] admission  Cardiac Monitoring: The patient was maintained on a cardiac monitor.  I personally viewed and interpreted the cardiac  monitored which showed an underlying rhythm of: sinus tachycardia  Reevaluation: After the interventions noted above, I reevaluated the patient and found that they have :stayed the same  Social Determinants of Health: Lives independently  Disposition: Admit to hospitalist  Co morbidities that complicate the patient evaluation  Past Medical History:  Diagnosis Date   Anxiety    Arthritis    Chronic kidney disease    Depression    GERD (gastroesophageal reflux disease)    Heart murmur    Hypertension    Insomnia    Meningitis    Pneumonia    Recurrent UTI    Seizures (HCC)    Substance abuse (HCC)      Medicines Meds ordered this encounter  Medications   lactated ringers  bolus 1,000 mL   ondansetron  (ZOFRAN ) injection 4 mg   cefTRIAXone  (ROCEPHIN ) 2 g in sodium chloride  0.9 % 100 mL IVPB    Antibiotic Indication::   UTI   lactated ringers  bolus 1,000 mL    Reason 30 mL/kg dose is not being ordered:   Non-Septic Shock Clinical Presentation    I have reviewed the patients home medicines and have made adjustments as needed  Problem List / ED Course: Problem List Items Addressed This Visit       Other   Altered mental state   * (Principal) Severe sepsis with lactic acidosis (HCC)   Other Visit Diagnoses       Acute cystitis without hematuria    -  Primary     Dysarthria                Franklyn Sid SAILOR, MD 05/22/24 1041

## 2024-05-13 NOTE — Sepsis Progress Note (Signed)
 Elink monitoring for the code sepsis protocol.

## 2024-05-13 NOTE — ED Notes (Signed)
 Franklyn, MD made aware of Mini [facility] Critical value by this NA through secure chat.

## 2024-05-13 NOTE — ED Triage Notes (Signed)
 Pt came home from [facility] 3 days ago after being treated for a UTI. Today she began vomiting around 3am and it has persisted all day. She has not been able to take her meds due to continuous vomiting. She denies abdominal pain, fever, or urinary symptoms.

## 2024-05-13 NOTE — ED Notes (Signed)
 Attempted IV access but unsuccessful at this time. US  IV requested

## 2024-05-14 ENCOUNTER — Inpatient Hospital Stay (HOSPITAL_COMMUNITY)

## 2024-05-14 DIAGNOSIS — Z1152 Encounter for screening for COVID-19: Secondary | ICD-10-CM | POA: Diagnosis not present

## 2024-05-14 DIAGNOSIS — R6520 Severe sepsis without septic shock: Secondary | ICD-10-CM

## 2024-05-14 DIAGNOSIS — A419 Sepsis, unspecified organism: Secondary | ICD-10-CM

## 2024-05-14 DIAGNOSIS — E869 Volume depletion, unspecified: Secondary | ICD-10-CM | POA: Diagnosis present

## 2024-05-14 DIAGNOSIS — N390 Urinary tract infection, site not specified: Secondary | ICD-10-CM | POA: Diagnosis present

## 2024-05-14 DIAGNOSIS — R569 Unspecified convulsions: Secondary | ICD-10-CM | POA: Diagnosis not present

## 2024-05-14 DIAGNOSIS — A0472 Enterocolitis due to Clostridium difficile, not specified as recurrent: Secondary | ICD-10-CM | POA: Diagnosis present

## 2024-05-14 DIAGNOSIS — Z66 Do not resuscitate: Secondary | ICD-10-CM | POA: Diagnosis present

## 2024-05-14 DIAGNOSIS — J9811 Atelectasis: Secondary | ICD-10-CM | POA: Diagnosis present

## 2024-05-14 DIAGNOSIS — R197 Diarrhea, unspecified: Secondary | ICD-10-CM | POA: Diagnosis not present

## 2024-05-14 DIAGNOSIS — E872 Acidosis, unspecified: Secondary | ICD-10-CM | POA: Diagnosis present

## 2024-05-14 DIAGNOSIS — R17 Unspecified jaundice: Secondary | ICD-10-CM | POA: Diagnosis present

## 2024-05-14 DIAGNOSIS — G9341 Metabolic encephalopathy: Secondary | ICD-10-CM | POA: Diagnosis present

## 2024-05-14 DIAGNOSIS — R6521 Severe sepsis with septic shock: Secondary | ICD-10-CM | POA: Diagnosis present

## 2024-05-14 DIAGNOSIS — E039 Hypothyroidism, unspecified: Secondary | ICD-10-CM | POA: Diagnosis present

## 2024-05-14 DIAGNOSIS — D649 Anemia, unspecified: Secondary | ICD-10-CM | POA: Diagnosis present

## 2024-05-14 DIAGNOSIS — N182 Chronic kidney disease, stage 2 (mild): Secondary | ICD-10-CM | POA: Diagnosis present

## 2024-05-14 DIAGNOSIS — I160 Hypertensive urgency: Secondary | ICD-10-CM | POA: Diagnosis present

## 2024-05-14 DIAGNOSIS — Z515 Encounter for palliative care: Secondary | ICD-10-CM | POA: Diagnosis not present

## 2024-05-14 DIAGNOSIS — E785 Hyperlipidemia, unspecified: Secondary | ICD-10-CM | POA: Diagnosis present

## 2024-05-14 DIAGNOSIS — R1310 Dysphagia, unspecified: Secondary | ICD-10-CM | POA: Diagnosis present

## 2024-05-14 DIAGNOSIS — G9589 Other specified diseases of spinal cord: Secondary | ICD-10-CM | POA: Diagnosis present

## 2024-05-14 DIAGNOSIS — R680 Hypothermia, not associated with low environmental temperature: Secondary | ICD-10-CM | POA: Diagnosis not present

## 2024-05-14 DIAGNOSIS — I129 Hypertensive chronic kidney disease with stage 1 through stage 4 chronic kidney disease, or unspecified chronic kidney disease: Secondary | ICD-10-CM | POA: Diagnosis present

## 2024-05-14 DIAGNOSIS — F32A Depression, unspecified: Secondary | ICD-10-CM | POA: Diagnosis present

## 2024-05-14 DIAGNOSIS — G2581 Restless legs syndrome: Secondary | ICD-10-CM | POA: Diagnosis present

## 2024-05-14 DIAGNOSIS — A412 Sepsis due to unspecified staphylococcus: Secondary | ICD-10-CM | POA: Diagnosis present

## 2024-05-14 DIAGNOSIS — R112 Nausea with vomiting, unspecified: Secondary | ICD-10-CM | POA: Diagnosis present

## 2024-05-14 LAB — BASIC METABOLIC PANEL WITH GFR
Anion gap: 14 (ref 5–15)
BUN: 15 mg/dL (ref 8–23)
CO2: 23 mmol/L (ref 22–32)
Calcium: 10 mg/dL (ref 8.9–10.3)
Chloride: 99 mmol/L (ref 98–111)
Creatinine, Ser: 0.76 mg/dL (ref 0.44–1.00)
GFR, Estimated: >60 mL/min (ref >60)
Glucose, Bld: 140 mg/dL — ABNORMAL HIGH (ref 70–99)
Potassium: 4 mmol/L (ref 3.5–5.1)
Sodium: 136 mmol/L (ref 135–145)

## 2024-05-14 LAB — URINALYSIS, W/ REFLEX TO CULTURE (INFECTION SUSPECTED)
Bilirubin Urine: NEGATIVE
Glucose, UA: NEGATIVE mg/dL
Ketones, ur: NEGATIVE mg/dL
Leukocytes,Ua: NEGATIVE
Nitrite: NEGATIVE
Protein, ur: 30 mg/dL — AB
Specific Gravity, Urine: 1.015 (ref 1.005–1.030)
pH: 6 (ref 5.0–8.0)

## 2024-05-14 LAB — BLOOD CULTURE ID PANEL (REFLEXED) - BCID2

## 2024-05-14 LAB — CBC
HCT: 39.9 % (ref 36.0–46.0)
Hemoglobin: 12.9 g/dL (ref 12.0–15.0)
MCH: 33.9 pg (ref 26.0–34.0)
MCHC: 32.3 g/dL (ref 30.0–36.0)
MCV: 105 fL — ABNORMAL HIGH (ref 80.0–100.0)
Platelets: 356 K/uL (ref 150–400)
RBC: 3.8 MIL/uL — ABNORMAL LOW (ref 3.87–5.11)
RDW: 12.6 % (ref 11.5–15.5)
WBC: 23.2 K/uL — ABNORMAL HIGH (ref 4.0–10.5)
nRBC: 0 % (ref 0.0–0.2)

## 2024-05-14 LAB — RESP PANEL BY RT-PCR (RSV, FLU A&B, COVID)  RVPGX2
Influenza A by PCR: NEGATIVE
Influenza B by PCR: NEGATIVE
Resp Syncytial Virus by PCR: NEGATIVE
SARS Coronavirus 2 by RT PCR: NEGATIVE

## 2024-05-14 LAB — PROTIME-INR
INR: 1.1 (ref 0.8–1.2)
Prothrombin Time: 14.4 s (ref 11.4–15.2)

## 2024-05-14 LAB — LACTIC ACID, PLASMA: Lactic Acid, Venous: 1.5 mmol/L (ref 0.5–1.9)

## 2024-05-14 LAB — LIPASE, BLOOD: Lipase: 51 U/L (ref 11–51)

## 2024-05-14 MED ORDER — ONDANSETRON HCL 4 MG/2ML IJ SOLN
4.0000 mg | Freq: Four times a day (QID) | INTRAMUSCULAR | Status: DC | PRN
  Administered 2024-05-15 – 2024-05-16 (×3): 4 mg via INTRAVENOUS
  Filled 2024-05-14 (×3): qty 2

## 2024-05-14 MED ORDER — HYDROCODONE-ACETAMINOPHEN 7.5-325 MG PO TABS
1.0000 | ORAL_TABLET | Freq: Once | ORAL | Status: AC
  Administered 2024-05-14: 1 via ORAL
  Filled 2024-05-14: qty 1

## 2024-05-14 MED ORDER — PANTOPRAZOLE SODIUM 40 MG PO TBEC
40.0000 mg | DELAYED_RELEASE_TABLET | Freq: Every day | ORAL | Status: DC
  Administered 2024-05-14 – 2024-05-16 (×3): 40 mg via ORAL
  Filled 2024-05-14 (×3): qty 1

## 2024-05-14 MED ORDER — ENOXAPARIN SODIUM 40 MG/0.4ML IJ SOSY
40.0000 mg | PREFILLED_SYRINGE | INTRAMUSCULAR | Status: DC
  Administered 2024-05-14 – 2024-05-16 (×3): 40 mg via SUBCUTANEOUS
  Filled 2024-05-14 (×3): qty 0.4

## 2024-05-14 MED ORDER — ACETAMINOPHEN 500 MG PO TABS
1000.0000 mg | ORAL_TABLET | Freq: Once | ORAL | Status: DC
  Filled 2024-05-14: qty 2

## 2024-05-14 MED ORDER — ACETAMINOPHEN 650 MG RE SUPP
650.0000 mg | Freq: Four times a day (QID) | RECTAL | Status: DC | PRN
Start: 2024-05-14 — End: 2024-05-17

## 2024-05-14 MED ORDER — ROSUVASTATIN CALCIUM 10 MG PO TABS
10.0000 mg | ORAL_TABLET | Freq: Every day | ORAL | Status: DC
  Administered 2024-05-14 – 2024-05-16 (×3): 10 mg via ORAL
  Filled 2024-05-14 (×4): qty 1

## 2024-05-14 MED ORDER — ONDANSETRON HCL 4 MG PO TABS: 4.0000 mg | ORAL_TABLET | Freq: Four times a day (QID) | ORAL | Status: DC | PRN

## 2024-05-14 MED ORDER — SODIUM CHLORIDE 0.9% FLUSH
3.0000 mL | Freq: Two times a day (BID) | INTRAVENOUS | Status: DC
  Administered 2024-05-14 – 2024-05-16 (×4): 3 mL via INTRAVENOUS

## 2024-05-14 MED ORDER — HYDRALAZINE HCL 20 MG/ML IJ SOLN
10.0000 mg | Freq: Four times a day (QID) | INTRAMUSCULAR | Status: DC | PRN
  Administered 2024-05-14 – 2024-05-15 (×4): 10 mg via INTRAVENOUS
  Filled 2024-05-14 (×5): qty 1

## 2024-05-14 MED ORDER — TIZANIDINE HCL 4 MG PO TABS
4.0000 mg | ORAL_TABLET | Freq: Three times a day (TID) | ORAL | Status: DC | PRN
Start: 2024-05-14 — End: 2024-05-17
  Administered 2024-05-14 – 2024-05-15 (×3): 4 mg via ORAL
  Filled 2024-05-14 (×3): qty 1

## 2024-05-14 MED ORDER — ENSURE PLUS HIGH PROTEIN PO LIQD
237.0000 mL | Freq: Two times a day (BID) | ORAL | Status: DC
Start: 2024-05-15 — End: 2024-05-17

## 2024-05-14 MED ORDER — VANCOMYCIN HCL 1750 MG/350ML IV SOLN
1750.0000 mg | INTRAVENOUS | Status: DC
  Administered 2024-05-14 – 2024-05-15 (×2): 1750 mg via INTRAVENOUS
  Filled 2024-05-14 (×2): qty 350

## 2024-05-14 MED ORDER — SODIUM CHLORIDE 0.9 % IV SOLN
2.0000 g | INTRAVENOUS | Status: DC
  Administered 2024-05-14 – 2024-05-16 (×3): 2 g via INTRAVENOUS
  Filled 2024-05-14 (×3): qty 20

## 2024-05-14 MED ORDER — OXYCODONE HCL 5 MG PO TABS
5.0000 mg | ORAL_TABLET | Freq: Four times a day (QID) | ORAL | Status: DC | PRN
  Administered 2024-05-14 – 2024-05-16 (×6): 5 mg via ORAL
  Filled 2024-05-14 (×6): qty 1

## 2024-05-14 MED ORDER — MORPHINE SULFATE (PF) 4 MG/ML IV SOLN
4.0000 mg | Freq: Once | INTRAVENOUS | Status: AC
  Administered 2024-05-14: 4 mg via INTRAVENOUS
  Filled 2024-05-14: qty 1

## 2024-05-14 MED ORDER — SUMATRIPTAN SUCCINATE 50 MG PO TABS
100.0000 mg | ORAL_TABLET | Freq: Every day | ORAL | Status: DC | PRN
  Administered 2024-05-15: 100 mg via ORAL
  Filled 2024-05-14 (×2): qty 2

## 2024-05-14 MED ORDER — SODIUM CHLORIDE 0.9 % IV SOLN: INTRAVENOUS | Status: AC

## 2024-05-14 MED ORDER — LEVOTHYROXINE SODIUM 25 MCG PO TABS
50.0000 ug | ORAL_TABLET | Freq: Every day | ORAL | Status: DC
  Administered 2024-05-14 – 2024-05-16 (×3): 50 ug via ORAL
  Filled 2024-05-14 (×3): qty 1

## 2024-05-14 MED ORDER — SODIUM CHLORIDE 0.9 % IV BOLUS
1000.0000 mL | Freq: Once | INTRAVENOUS | Status: AC
  Administered 2024-05-14: 1000 mL via INTRAVENOUS

## 2024-05-14 MED ORDER — SUMATRIPTAN SUCCINATE 50 MG PO TABS
100.0000 mg | ORAL_TABLET | Freq: Once | ORAL | Status: AC
  Administered 2024-05-14: 100 mg via ORAL
  Filled 2024-05-14: qty 2

## 2024-05-14 MED ORDER — METOPROLOL TARTRATE 25 MG PO TABS
100.0000 mg | ORAL_TABLET | Freq: Two times a day (BID) | ORAL | Status: DC
  Administered 2024-05-14 – 2024-05-16 (×5): 100 mg via ORAL
  Filled 2024-05-14 (×4): qty 2
  Filled 2024-05-14: qty 4

## 2024-05-14 MED ORDER — LACTATED RINGERS IV SOLN: 150.0000 mL/h | INTRAVENOUS | Status: DC

## 2024-05-14 MED ORDER — ACETAMINOPHEN 325 MG PO TABS
650.0000 mg | ORAL_TABLET | Freq: Four times a day (QID) | ORAL | Status: DC | PRN
  Administered 2024-05-14 – 2024-05-16 (×3): 650 mg via ORAL
  Filled 2024-05-14 (×3): qty 2

## 2024-05-14 MED ORDER — SENNOSIDES-DOCUSATE SODIUM 8.6-50 MG PO TABS: 1.0000 | ORAL_TABLET | Freq: Every evening | ORAL | Status: DC | PRN

## 2024-05-14 NOTE — ED Notes (Signed)
 Pt incontinent of urine- changed linens, gown and brief

## 2024-05-14 NOTE — Sepsis Progress Note (Signed)
Notified bedside nurse via secure chat of need to draw repeat lactic acid.

## 2024-05-14 NOTE — H&P (Signed)
 History and Physical    Stephanie Carrillo FMW:[id number] DOB: 05/11/1956 DOA: 05/13/2024  PCP: Hughie Sharper, MD  Patient coming from: Home  I have personally briefly reviewed patient's old medical records in [facility]  Chief Complaint: Nausea, vomiting,  HPI: Stephanie Carrillo is a 68 y.o. female with medical history significant for HTN, HLD, recurrent UTI, hypothyroidism, depression/anxiety, chronic myelopathy hx C5-7 ACDF, hx of lumbar decompression c/b CSF leak and meningitis, chronic pain, substance use who presented to the ED from home for evaluation of nausea and vomiting, dysuria.  Patient with a history of recurrent UTI with multiple admissions for urosepsis.  Most recently admitted at [facility] 9/24-9/28 with sepsis due to MDR E. coli UTI.  She was initially treated with IV cefepime .  Urine culture on 9/24 showed sensitivity to ceftriaxone  and nitrofurantoin .  She was discharged on oral nitrofurantoin .  It was noted that she had strokelike symptoms with dysarthria.  She had negative stroke workup and it was felt that the symptoms were secondary to sepsis/UTI.  Patient presenting to the ED today with nausea, vomiting, suprapubic pain, dysuria.  She is having some word finding difficulty.  She denies chest pain or dyspnea.  She denies any recent medication changes.  She tells me she lives along with her cats.  She is oriented to self and situation only, not to year or place.  ED Course  Labs/Imaging on admission: I have personally reviewed following labs and imaging studies.  Initial vitals showed BP 176/127, pulse 122, RR 20, temp 97.7 F, SpO2 100% on room air.  Labs showed WBC 17.2, hemoglobin 15.0, platelets 440, sodium 136, potassium 4.4, bicarb 19, BUN 18, creatinine 0.78, serum glucose 202, albumin 5.1, calcium  11.1, LFTs within normal limits, lactic acid 3.4.  UA ordered and pending collection.  Blood cultures ordered, 1 set collected second set pending collection.  Portable  chest x-ray showed linear atelectasis in the lung bases.  No focal consolidation, edema, effusion.  CTA head/neck negative for acute intracranial abnormality, no LVO or approximately significant stenosis.  Patient was given 2 L LR, IV ceftriaxone .  The hospitalist service was consulted for admission.  Review of Systems: All systems reviewed and are negative except as documented in history of present illness above.   Past Medical History:  Diagnosis Date   Anxiety    Arthritis    Chronic kidney disease    Depression    GERD (gastroesophageal reflux disease)    Heart murmur    Hypertension    Insomnia    Meningitis    Pneumonia    Recurrent UTI    Seizures (HCC)    Substance abuse [facility])     Past Surgical History:  Procedure Laterality Date   BACK SURGERY     CERVICAL DISC SURGERY     FRACTURE SURGERY N/A    [employer] 05/07/2020   SPINE SURGERY N/A    [employer] 05/07/2020    Social History: Patient denies tobacco, alcohol, illicit drug use.  Allergies  Allergen Reactions   Ciprofloxacin Shortness Of Breath    Respiratory arrest   Penicillins Shortness Of Breath and Rash    Did it involve swelling of the face/tongue/throat, SOB, or low BP? y Did it involve sudden or severe rash/hives, skin peeling, or any reaction on the inside of your mouth or nose? y Did you need to seek medical attention at a hospital or doctor's office? n When did it last happen?  1985 If all above answers are NO, may  proceed with cephalosporin use.  Tolerated Ceftriaxone  10/05/20 - 10/07/20   Oxycodone Other (See Comments)    Patient in recovery process.    Family History  Problem Relation Age of Onset   Multiple sclerosis Mother    Cancer Mother        Possibly breast or colon mets to brain   Cancer Father    Lung cancer Father    Heart disease Father    Cancer Brother    Testicular cancer Brother    Prostate cancer Brother    Skin cancer Brother    Colon polyps Brother    Colon  polyps Brother      Prior to Admission medications   Medication Sig Start Date End Date Taking? Authorizing Provider  acetaminophen  (TYLENOL ) 500 MG tablet Take 2 tablets (1,000 mg total) by mouth every 6 (six) hours as needed. 04/05/21   Drusilla Sabas RAMAN, MD  buprenorphine  (SUBUTEX ) 2 MG SUBL SL tablet Place 2 mg under the tongue 2 (two) times daily. 07/02/21   [provider]  clonazePAM  (KLONOPIN ) 0.5 MG tablet Take 1 tablet (0.5 mg total) by mouth 3 (three) times daily as needed for anxiety. 04/15/21   Barbarann Oneil BROCKS, MD  cyanocobalamin  (VITAMIN B12) 100 MCG tablet Take 100 mcg by mouth daily.    [provider]  diphenhydrAMINE  (BENADRYL ) 50 MG tablet Take 150 mg by mouth at bedtime as needed.    [provider]  FLUoxetine  (PROZAC ) 40 MG capsule Take 40 mg by mouth daily.    [provider]  gabapentin  (NEURONTIN ) 800 MG tablet Take 800 mg by mouth 3 (three) times daily.    [provider]  HYDROcodone -acetaminophen  (NORCO) 10-325 MG tablet Take 1 tablet by mouth in the morning, at noon, in the evening, and at bedtime. 06/04/21   [provider]  levothyroxine (SYNTHROID) 50 MCG tablet Take 50 mcg by mouth daily.    [provider]  lisinopril  (ZESTRIL ) 10 MG tablet Take 10 mg by mouth as needed. Taking when blood pressure is 160's (abnormal will take) 04/20/21   [provider]  magnesium  gluconate (MAGONATE) 500 MG tablet Take 500 mg by mouth daily.    [provider]  metoCLOPramide  (REGLAN ) 10 MG tablet Take 1 tablet (10 mg total) by mouth every 6 (six) hours. 08/01/23   Odell Balls, PA-C  metoprolol  tartrate (LOPRESSOR ) 100 MG tablet Take 1 tablet (100 mg total) by mouth 2 (two) times daily. 04/05/21   Drusilla Sabas RAMAN, MD  Multiple Vitamins-Minerals (CENTRUM SILVER 50+WOMEN) TABS Take 1 tablet by mouth daily.    [provider]  nitrofurantoin , macrocrystal-monohydrate, (MACROBID ) 100 MG capsule Take 100 mg  by mouth 2 (two) times daily. 06/08/23   [provider]  ondansetron  (ZOFRAN -ODT) 4 MG disintegrating tablet Dissolve 1 tablet under the tongue every 8 (eight) hours as needed. 07/01/23   Jerrol Agent, MD  oxyCODONE-acetaminophen  (PERCOCET/ROXICET) 5-325 MG tablet Take 1 tablet by mouth every 6 (six) hours as needed. 06/13/23   [provider]  pantoprazole  (PROTONIX ) 40 MG tablet Take 1 tablet (40 mg total) by mouth daily at 6 (six) AM. 10/08/20   Sebastian Toribio GAILS, MD  rosuvastatin  (CRESTOR ) 10 MG tablet Take 10 mg by mouth at bedtime. 07/02/21   [provider]  SUMAtriptan (IMITREX) 100 MG tablet Take 100 mg by mouth once. 06/03/23   [provider]  tiZANidine  (ZANAFLEX ) 4 MG tablet Take 4 mg by mouth 3 (three) times daily  as needed.    [provider]    Physical Exam: Vitals:   05/13/24 1959 05/13/24 2005 05/13/24 2150  BP: (!) 176/127  132/66  Pulse: (!) 122  (!) 111  Resp: 20 20 20   Temp: 97.7 F (36.5 C)  98 F (36.7 C)  TempSrc: Oral    SpO2: 100%  96%  Weight: 79.4 kg    Height: 5' 6 (1.676 m)     Constitutional: Resting supine in bed, NAD, calm Eyes: EOMI, lids and conjunctivae normal ENMT: Mucous membranes are dry. Posterior pharynx clear of any exudate or lesions.Normal dentition.  Neck: normal, supple, no masses. Respiratory: clear to auscultation bilaterally, no wheezing, no crackles. Normal respiratory effort. No accessory muscle use.  Cardiovascular: Tachycardic, no murmurs / rubs / gallops. No extremity edema. 2+ pedal pulses. Abdomen: Suprapubic tenderness, no masses palpated. Musculoskeletal: no clubbing / cyanosis. No joint deformity upper and lower extremities. Good ROM, no contractures. Normal muscle tone.  Skin: no rashes, lesions, ulcers. No induration Neurologic: Sensation intact. Strength 5/5 in all 4.  Psychiatric: Awake, alert, oriented to self and situation only.  EKG: Personally reviewed. Sinus  tachycardia, rate 118, no acute ischemic changes.  Assessment/Plan Principal Problem:   Severe sepsis with lactic acidosis (HCC) Active Problems:   Anxiety and depression   Chronic pain syndrome   Acute metabolic encephalopathy   Hypertension   Hypercalcemia   Hypothyroidism   Stephanie Carrillo is a 68 y.o. female with medical history significant for HTN, HLD, recurrent UTI, hypothyroidism, depression/anxiety, chronic myelopathy hx C5-7 ACDF, hx of lumbar decompression c/b CSF leak and meningitis, chronic pain, substance use who is admitted with sepsis presumed due to UTI.  Assessment and Plan: Severe sepsis: Patient presenting with leukocytosis, tachycardia, lactic acidosis.  Presumed source is urine given history although UA is pending collection and yield may be low as she has already received antibiotics.  Second set of blood cultures also pending collection.  CXR negative for pneumonia.  Last admitted at [facility] at end of September with sepsis due to MDR E. coli UTI.  Urine cultures showed sensitivity to ceftriaxone . - Continue IV ceftriaxone  for now - Follow UA, blood cultures - Continue IV fluid hydration overnight  Hypercalcemia: Calcium  11.1 on admission.  Appears overall volume depleted.  Continue IV fluid hydration and recheck labs in AM.  Acute metabolic encephalopathy: Secondary to sepsis/infectious process.  Similar presentation when admitted at [facility] recently with dysarthria and word finding difficulty.  Had negative stroke workup and symptoms resolved with treatment of sepsis.  Hypertension: Hold antihypertensives now in setting of sepsis.  Resume as warranted.  Hypothyroidism: Continue Synthroid.  Chronic pain and myelopathy: Home Percocet and gabapentin  on hold for now given encephalopathy.  Depression/anxiety: Home Klonopin  on hold tonight given encephalopathy.  Resume as able.   DVT prophylaxis: enoxaparin  (LOVENOX ) injection 40 mg Start: 05/14/24 1000 Code  Status: Full code Family Communication: None present on admission Disposition Plan: From home, dispo pending clinical progress Consults called: None Severity of Illness: The appropriate patient status for this patient is INPATIENT. Inpatient status is judged to be reasonable and necessary in order to provide the required intensity of service to ensure the patient's safety. The patient's presenting symptoms, physical exam findings, and initial radiographic and laboratory data in the context of their chronic comorbidities is felt to place them at high risk for further clinical deterioration. Furthermore, it is not anticipated that the patient will be medically stable for discharge from the hospital within  2 midnights of admission.   * I certify that at the point of admission it is my clinical judgment that the patient will require inpatient hospital care spanning beyond 2 midnights from the point of admission due to high intensity of service, high risk for further deterioration and high frequency of surveillance required.DEWAINE Jorie Blanch MD [facility]  If 7PM-7AM, please contact night-coverage [email or web address]  05/14/2024, 12:59 AM

## 2024-05-14 NOTE — Hospital Course (Signed)
 Stephanie Carrillo is a 68 y.o. female with medical history significant for HTN, HLD, recurrent UTI, hypothyroidism, depression/anxiety, chronic myelopathy hx C5-7 ACDF, hx of lumbar decompression c/b CSF leak and meningitis, chronic pain, substance use who is admitted with sepsis presumed due to UTI.

## 2024-05-14 NOTE — ED Notes (Addendum)
 Patient self removed 1/2 IV's. Patient stated she is ready to go home. Provider aware but patient is not able to go home due to her being altered.

## 2024-05-14 NOTE — Progress Notes (Signed)
 PHARMACY - PHYSICIAN COMMUNICATION CRITICAL VALUE ALERT - BLOOD CULTURE IDENTIFICATION (BCID)  Stephanie Carrillo is an 68 y.o. female who presented to [facility] on 05/13/2024 with a chief complaint of nausea and vomiting, dysuria .  Assessment:  One blood culture with both bottles + for MRSE.  Name of physician (or Provider) Contacted: R Pahwani  Current antibiotics: Rocephin   Changes to prescribed antibiotics recommended:  Vancomycin  1750 mg IV Q 24 hrs. Goal AUC 400-550. Expected AUC: 519 SCr used: 0.8   Results for orders placed or performed during the hospital encounter of 05/13/24  Blood Culture ID Panel (Reflexed) (Collected: 05/13/2024  8:59 PM)  Result Value Ref Range   Enterococcus faecalis NOT DETECTED NOT DETECTED   Enterococcus Faecium NOT DETECTED NOT DETECTED   Listeria monocytogenes NOT DETECTED NOT DETECTED   Staphylococcus species DETECTED (A) NOT DETECTED   Staphylococcus aureus (BCID) NOT DETECTED NOT DETECTED   Staphylococcus epidermidis DETECTED (A) NOT DETECTED   Staphylococcus lugdunensis NOT DETECTED NOT DETECTED   Streptococcus species NOT DETECTED NOT DETECTED   Streptococcus agalactiae NOT DETECTED NOT DETECTED   Streptococcus pneumoniae NOT DETECTED NOT DETECTED   Streptococcus pyogenes NOT DETECTED NOT DETECTED   A.calcoaceticus-baumannii NOT DETECTED NOT DETECTED   Bacteroides fragilis NOT DETECTED NOT DETECTED   Enterobacterales NOT DETECTED NOT DETECTED   Enterobacter cloacae complex NOT DETECTED NOT DETECTED   Escherichia coli NOT DETECTED NOT DETECTED   Klebsiella aerogenes NOT DETECTED NOT DETECTED   Klebsiella oxytoca NOT DETECTED NOT DETECTED   Klebsiella pneumoniae NOT DETECTED NOT DETECTED   Proteus species NOT DETECTED NOT DETECTED   Salmonella species NOT DETECTED NOT DETECTED   Serratia marcescens NOT DETECTED NOT DETECTED   Haemophilus influenzae NOT DETECTED NOT DETECTED   Neisseria meningitidis NOT DETECTED NOT DETECTED    Pseudomonas aeruginosa NOT DETECTED NOT DETECTED   Stenotrophomonas maltophilia NOT DETECTED NOT DETECTED   Candida albicans NOT DETECTED NOT DETECTED   Candida auris NOT DETECTED NOT DETECTED   Candida glabrata NOT DETECTED NOT DETECTED   Candida krusei NOT DETECTED NOT DETECTED   Candida parapsilosis NOT DETECTED NOT DETECTED   Candida tropicalis NOT DETECTED NOT DETECTED   Cryptococcus neoformans/gattii NOT DETECTED NOT DETECTED   Methicillin resistance mecA/C DETECTED (A) NOT DETECTED   Corie Vavra Karoline Marina, PharmD, BCPS Clinical Staff Pharmacist Marina [facility] 05/14/2024  6:41 PM

## 2024-05-14 NOTE — Progress Notes (Signed)
 PROGRESS NOTE    Stephanie Carrillo  FMW:[id number] DOB: May 20, 1956 DOA: 05/13/2024 PCP: Hughie Sharper, MD   Brief Narrative:  HPI: Stephanie Carrillo is a 68 y.o. female with medical history significant for HTN, HLD, recurrent UTI, hypothyroidism, depression/anxiety, chronic myelopathy hx C5-7 ACDF, hx of lumbar decompression c/b CSF leak and meningitis, chronic pain, substance use who presented to the ED from home for evaluation of nausea and vomiting, dysuria.   Patient with a history of recurrent UTI with multiple admissions for urosepsis.  Most recently admitted at [facility] 9/24-9/28 with sepsis due to MDR E. coli UTI.  She was initially treated with IV cefepime .  Urine culture on 9/24 showed sensitivity to ceftriaxone  and nitrofurantoin .  She was discharged on oral nitrofurantoin .  It was noted that she had strokelike symptoms with dysarthria.  She had negative stroke workup and it was felt that the symptoms were secondary to sepsis/UTI.   Patient presenting to the ED today with nausea, vomiting, suprapubic pain, dysuria.  She is having some word finding difficulty.  She denies chest pain or dyspnea.  She denies any recent medication changes.  She tells me she lives along with her cats.  She is oriented to self and situation only, not to year or place.   ED Course  Labs/Imaging on admission: I have personally reviewed following labs and imaging studies.   Initial vitals showed BP 176/127, pulse 122, RR 20, temp 97.7 F, SpO2 100% on room air.   Labs showed WBC 17.2, hemoglobin 15.0, platelets 440, sodium 136, potassium 4.4, bicarb 19, BUN 18, creatinine 0.78, serum glucose 202, albumin 5.1, calcium  11.1, LFTs within normal limits, lactic acid 3.4.   UA ordered and pending collection.  Blood cultures ordered, 1 set collected second set pending collection.   Portable chest x-ray showed linear atelectasis in the lung bases.  No focal consolidation, edema, effusion.   CTA head/neck negative for acute  intracranial abnormality, no LVO or approximately significant stenosis.   Patient was given 2 L LR, IV ceftriaxone .  The hospitalist service was consulted for admission.  Assessment & Plan:   Principal Problem:   Severe sepsis with lactic acidosis (HCC) Active Problems:   Anxiety and depression   Chronic pain syndrome   Acute metabolic encephalopathy   Hypertension   Hypercalcemia   Hypothyroidism  Severe sepsis secondary to presumed UTI: Patient presenting with leukocytosis, tachycardia, lactic acidosis.  Presumed source is urine given history although UA is pending collection and yield may be low as she has already received antibiotics.  Last admitted at [facility] at end of September with sepsis due to MDR E. coli UTI.  Urine cultures showed sensitivity to ceftriaxone .  Continue Rocephin  and follow urine and blood culture.  Also, patient is still tachycardic despite receiving 2 L of IV fluid bolus.  Lactic acid was over 3.  Repeating lactic is now.  Ordered 1 L fluid bolus as well.   Hypercalcemia: Calcium  11.1 on admission.  Resolved with hydration.   Acute metabolic encephalopathy: Secondary to sepsis/infectious process.  Similar presentation when admitted at [facility] recently with dysarthria and word finding difficulty.  Had negative stroke workup and symptoms resolved with treatment of sepsis.  Treat underlying cause.  Currently fully alert and oriented.   Hypertension/sinus tachycardia: Blood pressure elevated.  Resume amlodipine  and metoprolol .   Hypothyroidism: Continue Synthroid.   Chronic pain and myelopathy: Home Percocet and gabapentin  on hold for now given encephalopathy.   Depression/anxiety: Home Klonopin  on hold given encephalopathy.  Resume as able.  Migraine headache: Complains of severe headache.  Resumed Imitrex.  DVT prophylaxis: enoxaparin  (LOVENOX ) injection 40 mg Start: 05/14/24 1000   Code Status: Full Code  Family Communication:  None present at bedside.   Plan of care discussed with patient in length and he/she verbalized understanding and agreed with it.  Status is: Inpatient Remains inpatient appropriate because: Needs IV antibiotics.   Estimated body mass index is 28.25 kg/m as calculated from the following:   Height as of this encounter: 5' 6 (1.676 m).   Weight as of this encounter: 79.4 kg.    Nutritional Assessment: Body mass index is 28.25 kg/m.SABRA Seen by dietician.  I agree with the assessment and plan as outlined below: Nutrition Status:        . Skin Assessment: I have examined the patient's skin and I agree with the wound assessment as performed by the wound care RN as outlined below:    Consultants:  None  Procedures:  None  Antimicrobials:  Anti-infectives (From admission, onward)    Start     Dose/Rate Route Frequency Ordered Stop   05/14/24 2200  cefTRIAXone  (ROCEPHIN ) 2 g in sodium chloride  0.9 % 100 mL IVPB        2 g 200 mL/hr over 30 Minutes Intravenous Every 24 hours 05/14/24 0052 05/21/24 2159   05/13/24 2315  ceFEPIme  (MAXIPIME ) 2 g in sodium chloride  0.9 % 100 mL IVPB  Status:  Discontinued        2 g 200 mL/hr over 30 Minutes Intravenous  Once 05/13/24 2307 05/14/24 0044   05/13/24 2200  cefTRIAXone  (ROCEPHIN ) 2 g in sodium chloride  0.9 % 100 mL IVPB  Status:  Discontinued        2 g 200 mL/hr over 30 Minutes Intravenous Once 05/13/24 2157 05/13/24 2240         Subjective: Patient seen and examined, her major complaint was headache and some nausea.  Denied any shortness of breath or any urinary complaints.  Objective: Vitals:   05/14/24 0526 05/14/24 0548 05/14/24 0553 05/14/24 0624  BP: (!) 191/110 (!) 187/112 (!) 187/112 (!) 163/82  Pulse:  (!) 119  (!) 125  Resp: 19 18  14   Temp:  98.3 F (36.8 C)    TempSrc:      SpO2:  96%  93%  Weight:      Height:       No intake or output data in the 24 hours ending 05/14/24 0801 Filed Weights   05/13/24 1959  Weight: 79.4 kg     Examination:  General exam: Appears in pain and anxious. Respiratory system: Clear to auscultation. Respiratory effort normal. Cardiovascular system: S1 & S2 heard, RRR. No JVD, murmurs, rubs, gallops or clicks. No pedal edema. Gastrointestinal system: Abdomen is nondistended, soft and mild suprapubic tenderness. No organomegaly or masses felt. Normal bowel sounds heard. Central nervous system: Alert and oriented. No focal neurological deficits. Extremities: Symmetric 5 x 5 power. Skin: No rashes, lesions or ulcers  Data Reviewed: I have personally reviewed following labs and imaging studies  CBC: Recent Labs  Lab 05/13/24 2040 05/14/24 0533  WBC 17.2* 23.2*  NEUTROABS 15.9*  --   HGB 15.0 12.9  HCT 45.2 39.9  MCV 100.4* 105.0*  PLT 440* 356   Basic Metabolic Panel: Recent Labs  Lab 05/13/24 2040 05/14/24 0533  NA 136 136  K 4.4 4.0  CL 99 99  CO2 19* 23  GLUCOSE 202* 140*  BUN  18 15  CREATININE 0.78 0.76  CALCIUM  11.1* 10.0   GFR: Estimated Creatinine Clearance: 71.5 mL/min (by C-G formula based on SCr of 0.76 mg/dL). Liver Function Tests: Recent Labs  Lab 05/13/24 2040  AST 31  ALT 14  ALKPHOS 118  BILITOT 0.8  PROT 9.6*  ALBUMIN 5.1*   No results for input(s): LIPASE, AMYLASE in the last 168 hours. No results for input(s): AMMONIA in the last 168 hours. Coagulation Profile: Recent Labs  Lab 05/13/24 0050  INR 1.1   Cardiac Enzymes: No results for input(s): CKTOTAL, CKMB, CKMBINDEX, TROPONINI in the last 168 hours. BNP (last 3 results) No results for input(s): PROBNP in the last 8760 hours. HbA1C: No results for input(s): HGBA1C in the last 72 hours. CBG: No results for input(s): GLUCAP in the last 168 hours. Lipid Profile: No results for input(s): CHOL, HDL, LDLCALC, TRIG, CHOLHDL, LDLDIRECT in the last 72 hours. Thyroid  Function Tests: No results for input(s): TSH, T4TOTAL, FREET4, T3FREE,  THYROIDAB in the last 72 hours. Anemia Panel: No results for input(s): VITAMINB12, FOLATE, FERRITIN, TIBC, IRON, RETICCTPCT in the last 72 hours. Sepsis Labs: Recent Labs  Lab 05/13/24 2050  LATICACIDVEN 3.4*    Recent Results (from the past 240 hours)  Resp panel by RT-PCR (RSV, Flu A&B, Covid) Anterior Nasal Swab     Status: None   Collection Time: 05/14/24  1:50 AM   Specimen: Anterior Nasal Swab  Result Value Ref Range Status   SARS Coronavirus 2 by RT PCR NEGATIVE NEGATIVE Final    Comment: (NOTE) SARS-CoV-2 target nucleic acids are NOT DETECTED.  The SARS-CoV-2 RNA is generally detectable in upper respiratory specimens during the acute phase of infection. The lowest concentration of SARS-CoV-2 viral copies this assay can detect is 138 copies/mL. A negative result does not preclude SARS-Cov-2 infection and should not be used as the sole basis for treatment or other patient management decisions. A negative result may occur with  improper specimen collection/handling, submission of specimen other than nasopharyngeal swab, presence of viral mutation(s) within the areas targeted by this assay, and inadequate number of viral copies(<138 copies/mL). A negative result must be combined with clinical observations, patient history, and epidemiological information. The expected result is Negative.  Fact Sheet for Patients:  BloggerCourse.com  Fact Sheet for Healthcare Providers:  SeriousBroker.it  This test is no t yet approved or cleared by the [place]  FDA and  has been authorized for detection and/or diagnosis of SARS-CoV-2 by FDA under an Emergency Use Authorization (EUA). This EUA will remain  in effect (meaning this test can be used) for the duration of the COVID-19 declaration under Section 564(b)(1) of the Act, 21 U.S.C.section 360bbb-3(b)(1), unless the authorization is terminated  or revoked sooner.        Influenza A by PCR NEGATIVE NEGATIVE Final   Influenza B by PCR NEGATIVE NEGATIVE Final    Comment: (NOTE) The Xpert Xpress SARS-CoV-2/FLU/RSV plus assay is intended as an aid in the diagnosis of influenza from Nasopharyngeal swab specimens and should not be used as a sole basis for treatment. Nasal washings and aspirates are unacceptable for Xpert Xpress SARS-CoV-2/FLU/RSV testing.  Fact Sheet for Patients: BloggerCourse.com  Fact Sheet for Healthcare Providers: SeriousBroker.it  This test is not yet approved or cleared by the [place]  FDA and has been authorized for detection and/or diagnosis of SARS-CoV-2 by FDA under an Emergency Use Authorization (EUA). This EUA will remain in effect (meaning this test can be used) for the  duration of the COVID-19 declaration under Section 564(b)(1) of the Act, 21 U.S.C. section 360bbb-3(b)(1), unless the authorization is terminated or revoked.     Resp Syncytial Virus by PCR NEGATIVE NEGATIVE Final    Comment: (NOTE) Fact Sheet for Patients: BloggerCourse.com  Fact Sheet for Healthcare Providers: SeriousBroker.it  This test is not yet approved or cleared by the [place]  FDA and has been authorized for detection and/or diagnosis of SARS-CoV-2 by FDA under an Emergency Use Authorization (EUA). This EUA will remain in effect (meaning this test can be used) for the duration of the COVID-19 declaration under Section 564(b)(1) of the Act, 21 U.S.C. section 360bbb-3(b)(1), unless the authorization is terminated or revoked.  Performed at [facility], [address] [place], [place] [address]      Radiology Studies: DG Abd 1 View Result Date: 05/14/2024 EXAM: 1 VIEW XRAY OF THE ABDOMEN 05/14/2024 07:12:00 AM COMPARISON: 04/09/2023 CLINICAL HISTORY: Pt came home from [facility] 3 days ago after being treated  for a UTI. Today she began vomiting around 3am and it has persisted all day. She has not been able to take her meds due to continuous vomiting. FINDINGS: LINES, TUBES AND DEVICES: EKG leads in left upper quadrant. BOWEL: Moderate stool burden in ascending colon. Nonobstructive bowel gas pattern. SOFT TISSUES: Distended urinary bladder with excreted contrast. No opaque urinary calculi. BONES: Dextrocurvature of lumbar spine with associated degenerative changes. No acute osseous abnormality. IMPRESSION: 1. No acute findings. 2. Moderate stool burden in the ascending colon. Electronically signed by: Waddell Calk MD 05/14/2024 07:21 AM EDT RP Workstation: [id number]   CT ANGIO HEAD NECK W WO CM Result Date: 05/13/2024 EXAM: CTA HEAD AND NECK WITHOUT AND WITH 05/13/2024 11:08:46 PM TECHNIQUE: CTA of the head and neck was performed without and with the administration of 75mL iohexol  (OMNIPAQUE ) 350 MG/ML injection 75 mL IOHEXOL  350 MG/ML SOLN intravenous contrast. Multiplanar 2D and/or 3D reformatted images are provided for review. Automated exposure control, iterative reconstruction, and/or weight based adjustment of the mA/kV was utilized to reduce the radiation dose to as low as reasonably achievable. Stenosis of the internal carotid arteries measured using NASCET criteria. COMPARISON: None available CLINICAL HISTORY: Neuro deficit, acute, stroke suspected. Per triage note: Pt came home from [facility] 3 days ago after being treated for a UTI. Today she began vomiting around 3am and it has persisted all day. She has not been able to take her meds due to continuous vomiting. She denies abdominal pain, fever, or urinary symptoms. FINDINGS: AORTIC ARCH AND ARCH VESSELS: No dissection or arterial injury. No significant stenosis of the brachiocephalic or subclavian arteries. CERVICAL CAROTID ARTERIES: No dissection, arterial injury, or hemodynamically significant stenosis by NASCET criteria. CERVICAL VERTEBRAL ARTERIES: No  dissection, arterial injury, or significant stenosis.  Left dominant. LUNGS AND MEDIASTINUM: Unremarkable. SOFT TISSUES: No acute abnormality. BONES: No acute abnormality. 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 SEVERE SPINAL CANAL STENOSIS. ANTERIOR CIRCULATION: No significant stenosis of the internal carotid arteries. No significant stenosis of the anterior cerebral arteries. No significant stenosis of the middle cerebral arteries. No aneurysm. POSTERIOR CIRCULATION: No significant stenosis of the posterior cerebral arteries. No significant stenosis of the basilar artery. No significant stenosis of the vertebral arteries. No aneurysm. OTHER: No dural venous sinus thrombosis on this non-dedicated study. IMPRESSION: 1. No evidence of acute intracranial abnormality. 2. No large vessel occlusion or proximally significant stenosis. Electronically signed by: Gilmore Molt MD 05/13/2024 11:33  PM EDT RP Workstation: [id number]   DG Chest Port 1 View Result Date: 05/13/2024 CLINICAL DATA:  Question of sepsis to evaluate for abnormality. Patient was treated for urinary tract infection at [facility] 3 days ago. Today vomiting. Abdominal pain. EXAM: PORTABLE CHEST 1 VIEW COMPARISON:  April 08, 2023 FINDINGS: Heart size and pulmonary vascularity are normal for technique. Linear atelectasis in the lung bases. No airspace disease or consolidation. No pleural effusion or pneumothorax. Mediastinal contours appear intact. Postoperative changes in the cervical spine. Degenerative changes in the thoracic spine and shoulders. IMPRESSION: Linear atelectasis in the lung bases.  No focal consolidation. Electronically Signed   By: Elsie Gravely M.D.   On: 05/13/2024 22:31    Scheduled Meds:  enoxaparin  (LOVENOX ) injection  40 mg Subcutaneous Q24H   levothyroxine  50 mcg Oral Q0600   metoprolol  tartrate  100 mg Oral BID   pantoprazole   40 mg Oral Q0600   rosuvastatin    10 mg Oral QHS   sodium chloride  flush  3 mL Intravenous Q12H   SUMAtriptan  100 mg Oral Once   Continuous Infusions:  sodium chloride  150 mL/hr at 05/14/24 0411   cefTRIAXone  (ROCEPHIN )  IV     sodium chloride        LOS: 0 days   Fredia Skeeter, MD [facility]  05/14/2024, 8:01 AM  Total time spent: 47 minutes *Please note that this is a verbal dictation therefore any spelling or grammatical errors are due to the Dragon Medical One system interpretation.  Please page via Amion and do not message via secure chat for urgent patient care matters. Secure chat can be used for non urgent patient care matters.  How to contact the [facility] Attending or Consulting provider 7A - 7P or covering provider during after hours 7P -7A, for this patient?  Check the care team in [facility] and look for a) attending/consulting [facility] provider listed and b) the [facility] team listed. Page or secure chat 7A-7P. Log into [email or web address] and use [place]'s universal password to access. If you do not have the password, please contact the hospital operator. Locate the [facility] provider you are looking for under [facility] and page to a number that you can be directly reached. If you still have difficulty reaching the provider, please page the [facility] (Director on Call) for the Hospitalists listed on amion for assistance.

## 2024-05-15 DIAGNOSIS — E872 Acidosis, unspecified: Secondary | ICD-10-CM | POA: Diagnosis not present

## 2024-05-15 DIAGNOSIS — R6520 Severe sepsis without septic shock: Secondary | ICD-10-CM | POA: Diagnosis not present

## 2024-05-15 DIAGNOSIS — A419 Sepsis, unspecified organism: Secondary | ICD-10-CM | POA: Diagnosis not present

## 2024-05-15 LAB — CBC WITH DIFFERENTIAL/PLATELET
Abs Immature Granulocytes: 0.11 K/uL — ABNORMAL HIGH (ref 0.00–0.07)
Basophils Absolute: 0 K/uL (ref 0.0–0.1)
Basophils Relative: 0 %
Eosinophils Absolute: 0 K/uL (ref 0.0–0.5)
Eosinophils Relative: 0 %
HCT: 34.9 % — ABNORMAL LOW (ref 36.0–46.0)
Hemoglobin: 12.2 g/dL (ref 12.0–15.0)
Immature Granulocytes: 1 %
Lymphocytes Relative: 5 %
Lymphs Abs: 0.7 K/uL (ref 0.7–4.0)
MCH: 34.5 pg — ABNORMAL HIGH (ref 26.0–34.0)
MCHC: 35 g/dL (ref 30.0–36.0)
MCV: 98.6 fL (ref 80.0–100.0)
Monocytes Absolute: 1.3 K/uL — ABNORMAL HIGH (ref 0.1–1.0)
Monocytes Relative: 8 %
Neutro Abs: 14 K/uL — ABNORMAL HIGH (ref 1.7–7.7)
Neutrophils Relative %: 86 %
Platelets: 364 K/uL (ref 150–400)
RBC: 3.54 MIL/uL — ABNORMAL LOW (ref 3.87–5.11)
RDW: 12.9 % (ref 11.5–15.5)
WBC: 16.1 K/uL — ABNORMAL HIGH (ref 4.0–10.5)
nRBC: 0.1 % (ref 0.0–0.2)

## 2024-05-15 LAB — BASIC METABOLIC PANEL WITH GFR
Anion gap: 19 — ABNORMAL HIGH (ref 5–15)
BUN: 15 mg/dL (ref 8–23)
CO2: 18 mmol/L — ABNORMAL LOW (ref 22–32)
Calcium: 9 mg/dL (ref 8.9–10.3)
Chloride: 98 mmol/L (ref 98–111)
Creatinine, Ser: 0.68 mg/dL (ref 0.44–1.00)
GFR, Estimated: >60 mL/min (ref >60)
Glucose, Bld: 89 mg/dL (ref 70–99)
Potassium: 3.6 mmol/L (ref 3.5–5.1)
Sodium: 134 mmol/L — ABNORMAL LOW (ref 135–145)

## 2024-05-15 MED ORDER — GABAPENTIN 400 MG PO CAPS
800.0000 mg | ORAL_CAPSULE | Freq: Three times a day (TID) | ORAL | Status: DC
  Administered 2024-05-15 – 2024-05-16 (×4): 800 mg via ORAL
  Filled 2024-05-15 (×4): qty 2

## 2024-05-15 MED ORDER — TRAZODONE HCL 50 MG PO TABS: 150.0000 mg | ORAL_TABLET | Freq: Every evening | ORAL | Status: DC | PRN

## 2024-05-15 MED ORDER — SODIUM CHLORIDE 0.9% FLUSH: 10.0000 mL | INTRAVENOUS | Status: DC | PRN

## 2024-05-15 MED ORDER — CLONAZEPAM 0.5 MG PO TABS
0.5000 mg | ORAL_TABLET | Freq: Three times a day (TID) | ORAL | Status: DC | PRN
  Administered 2024-05-15 (×2): 0.5 mg via ORAL
  Filled 2024-05-15 (×2): qty 1

## 2024-05-15 MED ORDER — VITAMIN B-12 100 MCG PO TABS
100.0000 ug | ORAL_TABLET | Freq: Every day | ORAL | Status: DC
  Administered 2024-05-15 – 2024-05-16 (×2): 100 ug via ORAL
  Filled 2024-05-15 (×3): qty 1

## 2024-05-15 MED ORDER — FLUOXETINE HCL 20 MG PO CAPS
40.0000 mg | ORAL_CAPSULE | Freq: Every day | ORAL | Status: DC
Start: 2024-05-15 — End: 2024-05-17
  Administered 2024-05-15 – 2024-05-16 (×2): 40 mg via ORAL
  Filled 2024-05-15 (×2): qty 2

## 2024-05-15 NOTE — Progress Notes (Signed)
 MEWS Progress Note  Patient Details Name: Isys Tietje MRN: [id number] DOB: Apr 04, 1956 Today's Date: 05/15/2024   MEWS Flowsheet Documentation:  Assess: MEWS Score Temp: 98.3 F (36.8 C) BP: (!) 209/99 MAP (mmHg): 128 Pulse Rate: 71 ECG Heart Rate: 90 Resp: 16 Level of Consciousness: Alert SpO2: 96 % O2 Device: Room Air Patient Activity (if Appropriate): In chair Assess: MEWS Score MEWS Temp: 0 MEWS Systolic: 2 MEWS Pulse: 0 MEWS RR: 0 MEWS LOC: 0 MEWS Score: 2 MEWS Score Color: Yellow Assess: SIRS CRITERIA SIRS Temperature : 0 SIRS Respirations : 0 SIRS Pulse: 0 SIRS WBC: 0 SIRS Score Sum : 0 Assess: if the MEWS score is Yellow or Red Were vital signs accurate and taken at a resting state?: Yes Does the patient meet 2 or more of the SIRS criteria?: No MEWS guidelines implemented : Yes, yellow Treat MEWS Interventions: Considered administering scheduled or prn medications/treatments as ordered Take Vital Signs Increase Vital Sign Frequency : Yellow: Q2hr x1, continue Q4hrs until patient remains green for 12hrs Escalate MEWS: Escalate: Yellow: Discuss with charge nurse and consider notifying provider and/or RRT Notify: Charge Nurse/RN Name of Charge Nurse/RN Notified: Gustavo, Microbiologist Notification Provider Name/Title:  (No need, PRN med given) Notify: Rapid Response Name of Rapid Response RN Notified:  (No need, PRN med given)   10mg  PRN IV Hydralazine  given. Will recheck BP in an hour.   Margery JULIANNA Cary 05/15/2024, 1:42 PM

## 2024-05-15 NOTE — Progress Notes (Signed)
 PROGRESS NOTE    Stephanie Carrillo  FMW:[id number] DOB: [birth year] DOA: 05/13/2024 PCP: Hughie Sharper, MD   Brief Narrative:  Stephanie Carrillo is a 68 y.o. female with medical history significant for HTN, HLD, recurrent UTI, hypothyroidism, depression/anxiety, chronic myelopathy hx C5-7 ACDF, hx of lumbar decompression c/b CSF leak and meningitis, chronic pain, substance use who presented to the ED from home for evaluation of nausea and vomiting, dysuria.   Patient with a history of recurrent UTI with multiple admissions for urosepsis.  Most recently admitted at [facility] 9/24-9/28 with sepsis due to MDR E. coli UTI.  She was initially treated with IV cefepime .  Urine culture on 9/24 showed sensitivity to ceftriaxone  and nitrofurantoin .  She was discharged on oral nitrofurantoin .  It was noted that she had strokelike symptoms with dysarthria.  She had negative stroke workup and it was felt that the symptoms were secondary to sepsis/UTI.   Upon arrival to ED, she was hemodynamically stable.  Labs showed WBC 17.2, hemoglobin 15.0, platelets 440, sodium 136, potassium 4.4, bicarb 19, BUN 18, creatinine 0.78, serum glucose 202, albumin 5.1, calcium  11.1, LFTs within normal limits, lactic acid 3.4. Portable chest x-ray showed linear atelectasis in the lung bases.  No focal consolidation, edema, effusion.CTA head/neck negative for acute intracranial abnormality, no LVO or approximately significant stenosis.Patient was given 2 L LR, IV ceftriaxone .  The hospitalist service was consulted for admission.  Assessment & Plan:   Principal Problem:   Severe sepsis with lactic acidosis (HCC) Active Problems:   Anxiety and depression   Chronic pain syndrome   Acute metabolic encephalopathy   Hypertension   Hypercalcemia   Hypothyroidism  Severe sepsis secondary to presumed UTI and MRSE, POA: Patient presenting with leukocytosis, tachycardia, lactic acidosis.  Although UA is not impressive but presumed source is  urine given history, UA is likely low yield due to patient receiving antibiotics prior to collection of sample.  Last admitted at [facility] at end of September with sepsis due to MDR E. coli UTI.  Urine cultures showed sensitivity to ceftriaxone  last hospitalization but culture was not sent to this hospitalization.  Also, only 1 set of blood culture was drawn in the ED which is growing MRSE, for which she was also started on vancomycin .  Repeat blood cultures were drawn 05/14/2024.  Continue current antibiotics and follow final culture reports.  Lactic acidosis resolved.   Hypercalcemia: Calcium  11.1 on admission.  Resolved with hydration.   Acute metabolic encephalopathy: Secondary to sepsis/infectious process.  Similar presentation when admitted at [facility] recently with dysarthria and word finding difficulty.  Had negative stroke workup and symptoms resolved with treatment of sepsis.  Treat underlying cause.  Currently fully alert and oriented.   Hypertension/sinus tachycardia: Blood pressure very labile, patient only on metoprolol  100 mg p.o. twice daily PTA which has been resumed.   Hypothyroidism: Continue Synthroid.   Chronic pain and myelopathy/restless leg syndrome: Resuming Home Percocet and gabapentin  now that she is not encephalopathy and continues to complain of pain and has restless leg.   Depression/anxiety: Will resume Klonopin  due to severe anxiety.  Migraine headache: Resumed Imitrex.  DVT prophylaxis: enoxaparin  (LOVENOX ) injection 40 mg Start: 05/14/24 1000   Code Status: Full Code  Family Communication:  None present at bedside.  Plan of care discussed with patient in length and he/she verbalized understanding and agreed with it.  Status is: Inpatient Remains inpatient appropriate because: Needs IV antibiotics and to wait for final culture report.   Estimated body mass  index is 29.34 kg/m as calculated from the following:   Height as of this encounter: 5' 6 (1.676 m).    Weight as of this encounter: 82.5 kg.    Nutritional Assessment: Body mass index is 29.34 kg/m.SABRA Seen by dietician.  I agree with the assessment and plan as outlined below: Nutrition Status:   Skin Assessment: I have examined the patient's skin and I agree with the wound assessment as performed by the wound care RN as outlined below:    Consultants:  None  Procedures:  None  Antimicrobials:  Anti-infectives (From admission, onward)    Start     Dose/Rate Route Frequency Ordered Stop   05/14/24 2200  cefTRIAXone  (ROCEPHIN ) 2 g in sodium chloride  0.9 % 100 mL IVPB        2 g 200 mL/hr over 30 Minutes Intravenous Every 24 hours 05/14/24 0052 05/21/24 2159   05/14/24 1930  vancomycin  (VANCOREADY) IVPB 1750 mg/350 mL        1,750 mg 175 mL/hr over 120 Minutes Intravenous Every 24 hours 05/14/24 1841     05/13/24 2315  ceFEPIme  (MAXIPIME ) 2 g in sodium chloride  0.9 % 100 mL IVPB  Status:  Discontinued        2 g 200 mL/hr over 30 Minutes Intravenous  Once 05/13/24 2307 05/14/24 0044   05/13/24 2200  cefTRIAXone  (ROCEPHIN ) 2 g in sodium chloride  0.9 % 100 mL IVPB  Status:  Discontinued        2 g 200 mL/hr over 30 Minutes Intravenous Once 05/13/24 2157 05/13/24 2240         Subjective: And examined, complains of generalized bodyaches and anxiety and restless legs.  Objective: Vitals:   05/15/24 0024 05/15/24 0340 05/15/24 0429 05/15/24 0551  BP: 123/63  (!) 153/82   Pulse: 95  87   Resp: 20 20 17 11   Temp: 98.3 F (36.8 C)  99 F (37.2 C)   TempSrc: Oral  Oral   SpO2: 96%  93%   Weight:      Height:        Intake/Output Summary (Last 24 hours) at 05/15/2024 1040 Last data filed at 05/15/2024 0552 Gross per 24 hour  Intake 2737.15 ml  Output --  Net 2737.15 ml   Filed Weights   05/13/24 1959 05/14/24 1633  Weight: 79.4 kg 82.5 kg    Examination:  General exam: Appears anxious. Respiratory system: Clear to auscultation. Respiratory effort  normal. Cardiovascular system: S1 & S2 heard, RRR. No JVD, murmurs, rubs, gallops or clicks. No pedal edema. Gastrointestinal system: Abdomen is nondistended, soft and nontender. No organomegaly or masses felt. Normal bowel sounds heard. Central nervous system: Alert and oriented. No focal neurological deficits. Extremities: Symmetric 5 x 5 power. Skin: No rashes, lesions or ulcers.  Psychiatry: Judgement and insight appear normal. Mood & affect anxious  Data Reviewed: I have personally reviewed following labs and imaging studies  CBC: Recent Labs  Lab 05/13/24 2040 05/14/24 0533 05/15/24 0512  WBC 17.2* 23.2* 16.1*  NEUTROABS 15.9*  --  14.0*  HGB 15.0 12.9 12.2  HCT 45.2 39.9 34.9*  MCV 100.4* 105.0* 98.6  PLT 440* 356 364   Basic Metabolic Panel: Recent Labs  Lab 05/13/24 2040 05/14/24 0533 05/15/24 0512  NA 136 136 134*  K 4.4 4.0 3.6  CL 99 99 98  CO2 19* 23 18*  GLUCOSE 202* 140* 89  BUN 18 15 15   CREATININE 0.78 0.76 0.68  CALCIUM  11.1* 10.0  9.0   GFR: Estimated Creatinine Clearance: 72.9 mL/min (by C-G formula based on SCr of 0.68 mg/dL). Liver Function Tests: Recent Labs  Lab 05/13/24 2040  AST 31  ALT 14  ALKPHOS 118  BILITOT 0.8  PROT 9.6*  ALBUMIN 5.1*   Recent Labs  Lab 05/14/24 1722  LIPASE 51   No results for input(s): AMMONIA in the last 168 hours. Coagulation Profile: Recent Labs  Lab 05/13/24 0050  INR 1.1   Cardiac Enzymes: No results for input(s): CKTOTAL, CKMB, CKMBINDEX, TROPONINI in the last 168 hours. BNP (last 3 results) No results for input(s): PROBNP in the last 8760 hours. HbA1C: No results for input(s): HGBA1C in the last 72 hours. CBG: No results for input(s): GLUCAP in the last 168 hours. Lipid Profile: No results for input(s): CHOL, HDL, LDLCALC, TRIG, CHOLHDL, LDLDIRECT in the last 72 hours. Thyroid  Function Tests: No results for input(s): TSH, T4TOTAL, FREET4, T3FREE,  THYROIDAB in the last 72 hours. Anemia Panel: No results for input(s): VITAMINB12, FOLATE, FERRITIN, TIBC, IRON, RETICCTPCT in the last 72 hours. Sepsis Labs: Recent Labs  Lab 05/13/24 2050 05/14/24 0920  LATICACIDVEN 3.4* 1.5    Recent Results (from the past 240 hours)  Blood culture (routine x 2)     Status: None (Preliminary result)   Collection Time: 05/13/24  8:59 PM   Specimen: BLOOD  Result Value Ref Range Status   Specimen Description   Final    BLOOD LEFT ANTECUBITAL Performed at [facility], [address] [place], [place] [address]    Special Requests   Final    BOTTLES DRAWN AEROBIC AND ANAEROBIC Blood Culture results may not be optimal due to an inadequate volume of blood received in culture bottles Performed at [facility], [address], [place], [place] [address]    Culture  Setup Time   Final    GRAM POSITIVE COCCI IN CLUSTERS IN BOTH AEROBIC AND ANAEROBIC BOTTLES CRITICAL RESULT CALLED TO, READ BACK BY AND VERIFIED WITH: [facility] CRYSTAL ROBERTSON [id number] AT 1823 BY EC    Culture   Final    GRAM POSITIVE COCCI IDENTIFICATION TO FOLLOW Performed at [facility], [address], [place], [place] [address]    Report Status PENDING  Incomplete  Blood Culture ID Panel (Reflexed)     Status: Abnormal   Collection Time: 05/13/24  8:59 PM  Result Value Ref Range Status   Enterococcus faecalis NOT DETECTED NOT DETECTED Final   Enterococcus Faecium NOT DETECTED NOT DETECTED Final   Listeria monocytogenes NOT DETECTED NOT DETECTED Final   Staphylococcus species DETECTED (A) NOT DETECTED Final    Comment: CRITICAL RESULT CALLED TO, READ BACK BY AND VERIFIED WITH: [facility] CRYSTAL ROBERTSON [id number] AT 1823 BY EC    Staphylococcus aureus (BCID) NOT DETECTED NOT DETECTED Final   Staphylococcus epidermidis DETECTED (A) NOT DETECTED Final    Comment: Methicillin (oxacillin) resistant coagulase negative  staphylococcus. Possible blood culture contaminant (unless isolated from more than one blood culture draw or clinical case suggests pathogenicity). No antibiotic treatment is indicated for blood  culture contaminants. CRITICAL RESULT CALLED TO, READ BACK BY AND VERIFIED WITH: [facility] CRYSTAL ROBERTSON [id number] AT 1823 BY EC    Staphylococcus lugdunensis NOT DETECTED NOT DETECTED Final   Streptococcus species NOT DETECTED NOT DETECTED Final   Streptococcus agalactiae NOT DETECTED NOT DETECTED Final   Streptococcus pneumoniae NOT DETECTED NOT DETECTED Final   Streptococcus pyogenes NOT DETECTED NOT DETECTED Final  A.calcoaceticus-baumannii NOT DETECTED NOT DETECTED Final   Bacteroides fragilis NOT DETECTED NOT DETECTED Final   Enterobacterales NOT DETECTED NOT DETECTED Final   Enterobacter cloacae complex NOT DETECTED NOT DETECTED Final   Escherichia coli NOT DETECTED NOT DETECTED Final   Klebsiella aerogenes NOT DETECTED NOT DETECTED Final   Klebsiella oxytoca NOT DETECTED NOT DETECTED Final   Klebsiella pneumoniae NOT DETECTED NOT DETECTED Final   Proteus species NOT DETECTED NOT DETECTED Final   Salmonella species NOT DETECTED NOT DETECTED Final   Serratia marcescens NOT DETECTED NOT DETECTED Final   Haemophilus influenzae NOT DETECTED NOT DETECTED Final   Neisseria meningitidis NOT DETECTED NOT DETECTED Final   Pseudomonas aeruginosa NOT DETECTED NOT DETECTED Final   Stenotrophomonas maltophilia NOT DETECTED NOT DETECTED Final   Candida albicans NOT DETECTED NOT DETECTED Final   Candida auris NOT DETECTED NOT DETECTED Final   Candida glabrata NOT DETECTED NOT DETECTED Final   Candida krusei NOT DETECTED NOT DETECTED Final   Candida parapsilosis NOT DETECTED NOT DETECTED Final   Candida tropicalis NOT DETECTED NOT DETECTED Final   Cryptococcus neoformans/gattii NOT DETECTED NOT DETECTED Final   Methicillin resistance mecA/C DETECTED (A) NOT DETECTED Final    Comment: CRITICAL RESULT  CALLED TO, READ BACK BY AND VERIFIED WITH: [facility] CRYSTAL ROBERTSON [phone] AT 1823 BY EC Performed at [facility], [address] [place], [place] [address]   Resp panel by RT-PCR (RSV, Flu A&B, Covid) Anterior Nasal Swab     Status: None   Collection Time: 05/14/24  1:50 AM   Specimen: Anterior Nasal Swab  Result Value Ref Range Status   SARS Coronavirus 2 by RT PCR NEGATIVE NEGATIVE Final    Comment: (NOTE) SARS-CoV-2 target nucleic acids are NOT DETECTED.  The SARS-CoV-2 RNA is generally detectable in upper respiratory specimens during the acute phase of infection. The lowest concentration of SARS-CoV-2 viral copies this assay can detect is 138 copies/mL. A negative result does not preclude SARS-Cov-2 infection and should not be used as the sole basis for treatment or other patient management decisions. A negative result may occur with  improper specimen collection/handling, submission of specimen other than nasopharyngeal swab, presence of viral mutation(s) within the areas targeted by this assay, and inadequate number of viral copies(<138 copies/mL). A negative result must be combined with clinical observations, patient history, and epidemiological information. The expected result is Negative.  Fact Sheet for Patients:  BloggerCourse.com  Fact Sheet for Healthcare Providers:  SeriousBroker.it  This test is no t yet approved or cleared by the [place]  FDA and  has been authorized for detection and/or diagnosis of SARS-CoV-2 by FDA under an Emergency Use Authorization (EUA). This EUA will remain  in effect (meaning this test can be used) for the duration of the COVID-19 declaration under Section 564(b)(1) of the Act, 21 U.S.C.section 360bbb-3(b)(1), unless the authorization is terminated  or revoked sooner.       Influenza A by PCR NEGATIVE NEGATIVE Final   Influenza B by PCR NEGATIVE NEGATIVE Final     Comment: (NOTE) The Xpert Xpress SARS-CoV-2/FLU/RSV plus assay is intended as an aid in the diagnosis of influenza from Nasopharyngeal swab specimens and should not be used as a sole basis for treatment. Nasal washings and aspirates are unacceptable for Xpert Xpress SARS-CoV-2/FLU/RSV testing.  Fact Sheet for Patients: BloggerCourse.com  Fact Sheet for Healthcare Providers: SeriousBroker.it  This test is not yet approved or cleared by the [place]  FDA and has been authorized  for detection and/or diagnosis of SARS-CoV-2 by FDA under an Emergency Use Authorization (EUA). This EUA will remain in effect (meaning this test can be used) for the duration of the COVID-19 declaration under Section 564(b)(1) of the Act, 21 U.S.C. section 360bbb-3(b)(1), unless the authorization is terminated or revoked.     Resp Syncytial Virus by PCR NEGATIVE NEGATIVE Final    Comment: (NOTE) Fact Sheet for Patients: BloggerCourse.com  Fact Sheet for Healthcare Providers: SeriousBroker.it  This test is not yet approved or cleared by the [place]  FDA and has been authorized for detection and/or diagnosis of SARS-CoV-2 by FDA under an Emergency Use Authorization (EUA). This EUA will remain in effect (meaning this test can be used) for the duration of the COVID-19 declaration under Section 564(b)(1) of the Act, 21 U.S.C. section 360bbb-3(b)(1), unless the authorization is terminated or revoked.  Performed at [facility], [address], [place], [place] [address]   Blood culture (routine x 2)     Status: None (Preliminary result)   Collection Time: 05/14/24  7:19 PM   Specimen: BLOOD RIGHT HAND  Result Value Ref Range Status   Specimen Description BLOOD RIGHT HAND  Final   Special Requests   Final    BOTTLES DRAWN AEROBIC ONLY Blood Culture results may not be optimal due  to an inadequate volume of blood received in culture bottles   Culture   Final    NO GROWTH < 12 HOURS Performed at [facility], [address] [place], [place] [address]    Report Status PENDING  Incomplete     Radiology Studies: DG Abd 1 View Result Date: 05/14/2024 EXAM: 1 VIEW XRAY OF THE ABDOMEN 05/14/2024 07:12:00 AM COMPARISON: 04/09/2023 CLINICAL HISTORY: Pt came home from [facility] 3 days ago after being treated for a UTI. Today she began vomiting around 3am and it has persisted all day. She has not been able to take her meds due to continuous vomiting. FINDINGS: LINES, TUBES AND DEVICES: EKG leads in left upper quadrant. BOWEL: Moderate stool burden in ascending colon. Nonobstructive bowel gas pattern. SOFT TISSUES: Distended urinary bladder with excreted contrast. No opaque urinary calculi. BONES: Dextrocurvature of lumbar spine with associated degenerative changes. No acute osseous abnormality. IMPRESSION: 1. No acute findings. 2. Moderate stool burden in the ascending colon. Electronically signed by: Waddell Calk MD 05/14/2024 07:21 AM EDT RP Workstation: [id number]   CT ANGIO HEAD NECK W WO CM Result Date: 05/13/2024 EXAM: CTA HEAD AND NECK WITHOUT AND WITH 05/13/2024 11:08:46 PM TECHNIQUE: CTA of the head and neck was performed without and with the administration of 75mL iohexol  (OMNIPAQUE ) 350 MG/ML injection 75 mL IOHEXOL  350 MG/ML SOLN intravenous contrast. Multiplanar 2D and/or 3D reformatted images are provided for review. Automated exposure control, iterative reconstruction, and/or weight based adjustment of the mA/kV was utilized to reduce the radiation dose to as low as reasonably achievable. Stenosis of the internal carotid arteries measured using NASCET criteria. COMPARISON: None available CLINICAL HISTORY: Neuro deficit, acute, stroke suspected. Per triage note: Pt came home from [facility] 3 days ago after being treated for a UTI. Today she began vomiting around 3am and it has  persisted all day. She has not been able to take her meds due to continuous vomiting. She denies abdominal pain, fever, or urinary symptoms. FINDINGS: AORTIC ARCH AND ARCH VESSELS: No dissection or arterial injury. No significant stenosis of the brachiocephalic or subclavian arteries. CERVICAL CAROTID ARTERIES: No dissection, arterial injury, or hemodynamically significant  stenosis by NASCET criteria. CERVICAL VERTEBRAL ARTERIES: No dissection, arterial injury, or significant stenosis.  Left dominant. LUNGS AND MEDIASTINUM: Unremarkable. SOFT TISSUES: No acute abnormality. BONES: No acute abnormality. 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 SEVERE SPINAL CANAL STENOSIS. ANTERIOR CIRCULATION: No significant stenosis of the internal carotid arteries. No significant stenosis of the anterior cerebral arteries. No significant stenosis of the middle cerebral arteries. No aneurysm. POSTERIOR CIRCULATION: No significant stenosis of the posterior cerebral arteries. No significant stenosis of the basilar artery. No significant stenosis of the vertebral arteries. No aneurysm. OTHER: No dural venous sinus thrombosis on this non-dedicated study. IMPRESSION: 1. No evidence of acute intracranial abnormality. 2. No large vessel occlusion or proximally significant stenosis. Electronically signed by: Gilmore Molt MD 05/13/2024 11:33 PM EDT RP Workstation: [id number]   DG Chest Port 1 View Result Date: 05/13/2024 CLINICAL DATA:  Question of sepsis to evaluate for abnormality. Patient was treated for urinary tract infection at [facility] 3 days ago. Today vomiting. Abdominal pain. EXAM: PORTABLE CHEST 1 VIEW COMPARISON:  April 08, 2023 FINDINGS: Heart size and pulmonary vascularity are normal for technique. Linear atelectasis in the lung bases. No airspace disease or consolidation. No pleural effusion or pneumothorax. Mediastinal contours appear intact. Postoperative  changes in the cervical spine. Degenerative changes in the thoracic spine and shoulders. IMPRESSION: Linear atelectasis in the lung bases.  No focal consolidation. Electronically Signed   By: Elsie Gravely M.D.   On: 05/13/2024 22:31    Scheduled Meds:  enoxaparin  (LOVENOX ) injection  40 mg Subcutaneous Q24H   feeding supplement  237 mL Oral BID BM   levothyroxine  50 mcg Oral Q0600   metoprolol  tartrate  100 mg Oral BID   pantoprazole   40 mg Oral Daily   rosuvastatin   10 mg Oral QHS   sodium chloride  flush  3 mL Intravenous Q12H   Continuous Infusions:  cefTRIAXone  (ROCEPHIN )  IV Stopped (05/14/24 2330)   vancomycin  Stopped (05/14/24 2256)     LOS: 1 day   Fredia Skeeter, MD [facility]  05/15/2024, 10:40 AM  Total time spent: 47 minutes *Please note that this is a verbal dictation therefore any spelling or grammatical errors are due to the Dragon Medical One system interpretation.  Please page via Amion and do not message via secure chat for urgent patient care matters. Secure chat can be used for non urgent patient care matters.  How to contact the [facility] Attending or Consulting provider 7A - 7P or covering provider during after hours 7P -7A, for this patient?  Check the care team in [facility] and look for a) attending/consulting [facility] provider listed and b) the [facility] team listed. Page or secure chat 7A-7P. Log into [email or web address] and use [facility]'s universal password to access. If you do not have the password, please contact the hospital operator. Locate the [facility] provider you are looking for under [facility] and page to a number that you can be directly reached. If you still have difficulty reaching the provider, please page the [facility] (Director on Call) for the Hospitalists listed on amion for assistance.

## 2024-05-16 ENCOUNTER — Inpatient Hospital Stay (HOSPITAL_COMMUNITY)

## 2024-05-16 DIAGNOSIS — R680 Hypothermia, not associated with low environmental temperature: Secondary | ICD-10-CM | POA: Diagnosis not present

## 2024-05-16 DIAGNOSIS — E872 Acidosis, unspecified: Secondary | ICD-10-CM | POA: Diagnosis not present

## 2024-05-16 DIAGNOSIS — G9341 Metabolic encephalopathy: Secondary | ICD-10-CM

## 2024-05-16 DIAGNOSIS — E039 Hypothyroidism, unspecified: Secondary | ICD-10-CM

## 2024-05-16 DIAGNOSIS — R6520 Severe sepsis without septic shock: Secondary | ICD-10-CM | POA: Diagnosis not present

## 2024-05-16 DIAGNOSIS — R197 Diarrhea, unspecified: Secondary | ICD-10-CM | POA: Diagnosis not present

## 2024-05-16 DIAGNOSIS — A419 Sepsis, unspecified organism: Secondary | ICD-10-CM | POA: Diagnosis not present

## 2024-05-16 LAB — CBC WITH DIFFERENTIAL/PLATELET
Abs Immature Granulocytes: 0.52 K/uL — ABNORMAL HIGH (ref 0.00–0.07)
Basophils Absolute: 0 K/uL (ref 0.0–0.1)
Basophils Absolute: 0.1 K/uL (ref 0.0–0.1)
Basophils Relative: 0 %
Basophils Relative: 0 %
Eosinophils Absolute: 0 K/uL (ref 0.0–0.5)
Eosinophils Absolute: 0 K/uL (ref 0.0–0.5)
Eosinophils Relative: 0 %
Eosinophils Relative: 0 %
HCT: 21.1 % — ABNORMAL LOW (ref 36.0–46.0)
HCT: 22.5 % — ABNORMAL LOW (ref 36.0–46.0)
Hemoglobin: 7.6 g/dL — ABNORMAL LOW (ref 12.0–15.0)
Hemoglobin: 8 g/dL — ABNORMAL LOW (ref 12.0–15.0)
Immature Granulocytes: 2 %
Lymphocytes Relative: 7 %
Lymphocytes Relative: 5 %
Lymphs Abs: 2 K/uL (ref 0.7–4.0)
Lymphs Abs: 1.4 K/uL (ref 0.7–4.0)
MCH: 35.7 pg — ABNORMAL HIGH (ref 26.0–34.0)
MCH: 35.1 pg — ABNORMAL HIGH (ref 26.0–34.0)
MCHC: 36 g/dL (ref 30.0–36.0)
MCHC: 35.6 g/dL (ref 30.0–36.0)
MCV: 99.1 fL (ref 80.0–100.0)
MCV: 98.7 fL (ref 80.0–100.0)
Monocytes Absolute: 1.7 K/uL — ABNORMAL HIGH (ref 0.1–1.0)
Monocytes Absolute: 4 K/uL — ABNORMAL HIGH (ref 0.1–1.0)
Monocytes Relative: 6 %
Monocytes Relative: 15 %
Neutro Abs: 24.9 K/uL — ABNORMAL HIGH (ref 1.7–7.7)
Neutro Abs: 21.3 K/uL — ABNORMAL HIGH (ref 1.7–7.7)
Neutrophils Relative %: 87 %
Neutrophils Relative %: 78 %
Platelets: 244 K/uL (ref 150–400)
Platelets: 278 K/uL (ref 150–400)
RBC: 2.13 MIL/uL — ABNORMAL LOW (ref 3.87–5.11)
RBC: 2.28 MIL/uL — ABNORMAL LOW (ref 3.87–5.11)
RDW: 12.2 % (ref 11.5–15.5)
RDW: 12.4 % (ref 11.5–15.5)
Smear Review: NORMAL
WBC: 28.6 K/uL — ABNORMAL HIGH (ref 4.0–10.5)
WBC: 27.3 K/uL — ABNORMAL HIGH (ref 4.0–10.5)
nRBC: 4.3 % — ABNORMAL HIGH (ref 0.0–0.2)
nRBC: 4.7 % — ABNORMAL HIGH (ref 0.0–0.2)

## 2024-05-16 LAB — BLOOD GAS, ARTERIAL
Acid-base deficit: 19.8 mmol/L — ABNORMAL HIGH (ref 0.0–2.0)
Acid-base deficit: 17.1 mmol/L — ABNORMAL HIGH (ref 0.0–2.0)
Bicarbonate: 7.1 mmol/L — ABNORMAL LOW (ref 20.0–28.0)
Bicarbonate: 8.8 mmol/L — ABNORMAL LOW (ref 20.0–28.0)
Drawn by: 51133
Drawn by: 51133
O2 Content: 5 L/min
O2 Content: 5 L/min
O2 Saturation: 100 %
O2 Saturation: 100 %
Patient temperature: 37
Patient temperature: 35.2
pCO2 arterial: 21 mmHg — ABNORMAL LOW (ref 32–48)
pCO2 arterial: 20 mmHg — ABNORMAL LOW (ref 32–48)
pH, Arterial: 7.14 — CL (ref 7.35–7.45)
pH, Arterial: 7.23 — ABNORMAL LOW (ref 7.35–7.45)
pO2, Arterial: 141 mmHg — ABNORMAL HIGH (ref 83–108)
pO2, Arterial: 151 mmHg — ABNORMAL HIGH (ref 83–108)

## 2024-05-16 LAB — C DIFFICILE (CDIFF) QUICK SCRN (NO PCR REFLEX)
C Diff antigen: POSITIVE — AB
C Diff toxin: NEGATIVE

## 2024-05-16 LAB — CULTURE, BLOOD (ROUTINE X 2)

## 2024-05-16 LAB — MRSA NEXT GEN BY PCR, NASAL: MRSA by PCR Next Gen: DETECTED — AB

## 2024-05-16 MED ORDER — NOREPINEPHRINE 4 MG/250ML-% IV SOLN
0.0000 ug/min | INTRAVENOUS | Status: DC
  Administered 2024-05-16: 10 ug/min via INTRAVENOUS
  Administered 2024-05-17: 20 ug/min via INTRAVENOUS
  Administered 2024-05-17: 23 ug/min via INTRAVENOUS
  Filled 2024-05-16 (×2): qty 250

## 2024-05-16 MED ORDER — IBUPROFEN 200 MG PO TABS: 400.0000 mg | ORAL_TABLET | Freq: Four times a day (QID) | ORAL | Status: DC | PRN

## 2024-05-16 MED ORDER — ORAL CARE MOUTH RINSE: 15.0000 mL | OROMUCOSAL | Status: DC | PRN

## 2024-05-16 MED ORDER — SUCCINYLCHOLINE CHLORIDE 200 MG/10ML IV SOSY
PREFILLED_SYRINGE | INTRAVENOUS | Status: AC
  Filled 2024-05-16: qty 10

## 2024-05-16 MED ORDER — SODIUM CHLORIDE 0.9 % IV BOLUS
1000.0000 mL | Freq: Once | INTRAVENOUS | Status: AC
  Administered 2024-05-16: 1000 mL via INTRAVENOUS

## 2024-05-16 MED ORDER — MIDAZOLAM HCL 2 MG/2ML IJ SOLN
INTRAMUSCULAR | Status: AC
  Filled 2024-05-16: qty 2

## 2024-05-16 MED ORDER — SODIUM BICARBONATE 8.4 % IV SOLN
50.0000 meq | Freq: Once | INTRAVENOUS | Status: AC
  Administered 2024-05-16: 50 meq via INTRAVENOUS
  Filled 2024-05-16: qty 50

## 2024-05-16 MED ORDER — ROCURONIUM BROMIDE 10 MG/ML (PF) SYRINGE
PREFILLED_SYRINGE | INTRAVENOUS | Status: AC
  Filled 2024-05-16: qty 10

## 2024-05-16 MED ORDER — ETOMIDATE 2 MG/ML IV SOLN
INTRAVENOUS | Status: AC
  Filled 2024-05-16: qty 20

## 2024-05-16 MED ORDER — SODIUM BICARBONATE 8.4 % IV SOLN
INTRAVENOUS | Status: DC
  Filled 2024-05-16: qty 150

## 2024-05-16 MED ORDER — FENTANYL CITRATE (PF) 50 MCG/ML IJ SOSY
PREFILLED_SYRINGE | INTRAMUSCULAR | Status: AC
  Filled 2024-05-16: qty 2

## 2024-05-16 MED ORDER — CHLORHEXIDINE GLUCONATE CLOTH 2 % EX PADS
6.0000 | MEDICATED_PAD | Freq: Every day | CUTANEOUS | Status: DC
  Administered 2024-05-16: 6 via TOPICAL

## 2024-05-16 MED FILL — Medication: Qty: 1 | Status: AC

## 2024-05-16 NOTE — Progress Notes (Signed)
 eLink Physician-Brief Progress Note Patient Name: Stephanie Carrillo DOB: 03/05/1956 MRN: [id number]   Date of Service  05/16/2024  HPI/Events of Note  68/F with hx of HTN, dyslipidemia, recurrent UTI, most recently admitted at [facility] 9/24-9/28 with sepsus due to MDR E. Coli, chronic myelopathy, hx of C5-7 ACDF, hx of lumbar decompression, complicated by CSF leak and meningitis, transferred from the floors due to possible seizure. Pt complaining of flank pain.   BP 100/54, HR 109, RR 23, O2 sats 100% Repeat ABG 7.23/20/151 Lactic acid 3.4 --> 1.5 Pt appears comfortable.  She is oriented x 2 as per bedside RN.  eICU Interventions  Give 1 amp of HCO3. Give ibuprofen  PRN for pain.  Resend urinalysis.  Consider CT abdomen for possible renal stones. Last CT abdomen on record is from 2024.      Intervention Category Evaluation Type: New Patient Evaluation  Jerelyn Trimarco 05/16/2024, 9:46 PM

## 2024-05-16 NOTE — Consult Note (Addendum)
 NAME:  Stephanie Carrillo, MRN:  [id number], DOB:  1956-03-09, LOS: 2 ADMISSION DATE:  05/13/2024, CONSULTATION DATE:  05/16/2024 REFERRING MD:  Jacquline Skeeter, MD, CHIEF COMPLAINT:  Severe Sepsis.   History of Present Illness:  68 year old woman with a history of hypertension hyperlipidemia recurrent UTIs hypothyroidism depression anxiety chronic myelopathy history of meningitis chronic substance use presents to the ER with nausea vomiting and dysuria.  She is being treated as severe sepsis with presumed UTI.  She has been on Rocephin  for the last couple of days.  Patient apparently has been getting more confused her white cell count is worsening and also she had potential seizure.  Unclear who witnessed this but patient went unresponsive and a code was called.  By the time she was getting intubated she woke up and started talking.  She was initially hypotensive but not anymore.  She was on Levophed temporarily and fluid bolus was given and improved significantly.  ABG initially showed severe metabolic acidosis but this seem to have improved after a couple of amps of bicarb.   Time I came to see her in the ICU patient was alert awake oriented x 3 airway is patent and hemodynamically stable not needing any vasopressor therapy.  She complains of diffuse abdominal pain throughout nausea.  Blood cultures from 10/11 are showing coag negative staph.  This from 1 bottle.  Patient also had some diarrhea her C. difficile antigen is positive but toxin is negative.  Results are indeterminate at this stage.  Pertinent  Medical History   Past Medical History:  Diagnosis Date   Anxiety    Arthritis    Chronic kidney disease    Depression    GERD (gastroesophageal reflux disease)    Heart murmur    Hypertension    Insomnia    Meningitis    Pneumonia    Recurrent UTI    Seizures (HCC)    Substance abuse (HCC)      Significant Hospital Events: Including procedures, antibiotic start and stop dates in  addition to other pertinent events   10/13/ 2025 - Patient is moved to ICU for an unresponsive episode and hypotension.  Interim History / Subjective:  By the time I reached ICU patient is alert awake oriented x 3 here medically stable and airways patent.  Objective    Blood pressure (!) 100/54, pulse 68, temperature 98.2 F (36.8 C), temperature source Oral, resp. rate (!) 22, height 5' 6 (1.676 m), weight 82.5 kg, SpO2 100%.        Intake/Output Summary (Last 24 hours) at 05/16/2024 2340 Last data filed at 05/16/2024 2329 Gross per 24 hour  Intake 442.02 ml  Output --  Net 442.02 ml   Filed Weights   05/13/24 1959 05/14/24 1633  Weight: 79.4 kg 82.5 kg    Examination: Physical exam: General: Patient is alert awake not completely oriented.  No apparent acute distress. HEENT: Both pupils equal reactive to light moist mucous membranes.  Trachea in the midline. Neuro: Alert, awake following commands, no complete orientation.  Moving all 4 limbs. Chest: Coarse breath sounds, no wheezes or rhonchi Heart: Regular rate and rhythm, no murmurs or gallops Abdomen: Soft, diffuse tenderness no guarding no rigidity there is a left CVA angle tenderness. Extremities: No deformity   Resolved problem list   Assessment and Plan   Severe sepsis Hypothermia due to severe sepsis Unresponsive episode questionable seizure Diarrhea C. difficile antigen positive toxin negative results indeterminate Severe metabolic acidosis Hypothyroidism Neck pain  myelopathy restless leg syndrome  Stable depression and anxiety   Plan: Patient is given an amp of bicarb will start a bicarb drip repeat labs. Change Rocephin  to Zosyn. Repeat blood cultures. CT abdomen pelvis. CT head Seizure precautions. She is currently not needing any vasopressor therapy if need be will start Levophed. Patient is alert and following commands and airway is patent will watch. If the CT shows colitis we will stop  antibiotics and start oral vancomycin . Bair hugger to rewarm Patient has very difficult PIV access, she only has 1 PIV and nursing could not get further IV access.  At this stage there is a potential for decompensation therefore we will go ahead and put in a central line.    Labs   CBC: Recent Labs  Lab 05/13/24 2040 05/14/24 0533 05/15/24 0512 05/16/24 1150 05/16/24 1549  WBC 17.2* 23.2* 16.1* 28.6* 27.3*  NEUTROABS 15.9*  --  14.0* 24.9* 21.3*  HGB 15.0 12.9 12.2 7.6* 8.0*  HCT 45.2 39.9 34.9* 21.1* 22.5*  MCV 100.4* 105.0* 98.6 99.1 98.7  PLT 440* 356 364 244 278    Basic Metabolic Panel: Recent Labs  Lab 05/13/24 2040 05/14/24 0533 05/15/24 0512  NA 136 136 134*  K 4.4 4.0 3.6  CL 99 99 98  CO2 19* 23 18*  GLUCOSE 202* 140* 89  BUN 18 15 15   CREATININE 0.78 0.76 0.68  CALCIUM  11.1* 10.0 9.0   GFR: Estimated Creatinine Clearance: 72.9 mL/min (by C-G formula based on SCr of 0.68 mg/dL). Recent Labs  Lab 05/13/24 2050 05/14/24 0533 05/14/24 0920 05/15/24 0512 05/16/24 1150 05/16/24 1549  WBC  --  23.2*  --  16.1* 28.6* 27.3*  LATICACIDVEN 3.4*  --  1.5  --   --   --     Liver Function Tests: Recent Labs  Lab 05/13/24 2040  AST 31  ALT 14  ALKPHOS 118  BILITOT 0.8  PROT 9.6*  ALBUMIN 5.1*   Recent Labs  Lab 05/14/24 1722  LIPASE 51   No results for input(s): AMMONIA in the last 168 hours.  ABG    Component Value Date/Time   PHART 7.23 (L) 05/16/2024 2121   PCO2ART 20 (L) 05/16/2024 2121   PO2ART 151 (H) 05/16/2024 2121   HCO3 8.8 (L) 05/16/2024 2121   TCO2 30 03/17/2021 1610   ACIDBASEDEF 17.1 (H) 05/16/2024 2121   O2SAT 100 05/16/2024 2121     Coagulation Profile: Recent Labs  Lab 05/13/24 0050  INR 1.1    Cardiac Enzymes: No results for input(s): CKTOTAL, CKMB, CKMBINDEX, TROPONINI in the last 168 hours.  HbA1C: Hgb A1c MFr Bld  Date/Time Value Ref Range Status  10/26/2020 02:08 PM 5.1 <5.7 % of total Hgb Final     Comment:    For the purpose of screening for the presence of diabetes: . <5.7%       Consistent with the absence of diabetes 5.7-6.4%    Consistent with increased risk for diabetes             (prediabetes) > or =6.5%  Consistent with diabetes . This assay result is consistent with a decreased risk of diabetes. . Currently, no consensus exists regarding use of hemoglobin A1c for diagnosis of diabetes in children. . According to American Diabetes Association (ADA) guidelines, hemoglobin A1c <7.0% represents optimal control in non-pregnant diabetic patients. Different metrics may apply to specific patient populations.  Standards of Medical Care in Diabetes(ADA). SABRA   02/24/2020 04:51 AM 4.9  4.8 - 5.6 % Final    Comment:    (NOTE) Pre diabetes:          5.7%-6.4%  Diabetes:              >6.4%  Glycemic control for   <7.0% adults with diabetes     CBG: No results for input(s): GLUCAP in the last 168 hours.  Review of Systems:   12 point review of systems is done which is negative for anything else other than what is mentioned in HPI.  Past Medical History:  She,  has a past medical history of Anxiety, Arthritis, Chronic kidney disease, Depression, GERD (gastroesophageal reflux disease), Heart murmur, Hypertension, Insomnia, Meningitis, Pneumonia, Recurrent UTI, Seizures (HCC), and Substance abuse (HCC).   Surgical History:   Past Surgical History:  Procedure Laterality Date   BACK SURGERY     CERVICAL DISC SURGERY     FRACTURE SURGERY N/A    [employer]reesia 05/07/2020   SPINE SURGERY N/A    Phreesia 05/07/2020     Social History:   reports that she has quit smoking. Her smoking use included cigarettes. She has a 30 pack-year smoking history. She has never used smokeless tobacco. She reports that she does not currently use alcohol. She reports that she does not currently use drugs.   Family History:  Her family history includes Cancer in her brother, father, and  mother; Colon polyps in her brother and brother; Heart disease in her father; Lung cancer in her father; Multiple sclerosis in her mother; Prostate cancer in her brother; Skin cancer in her brother; Testicular cancer in her brother.   Allergies Allergies  Allergen Reactions   Ciprofloxacin Shortness Of Breath    Respiratory arrest   Penicillins Shortness Of Breath and Rash    Did it involve swelling of the face/tongue/throat, SOB, or low BP? y Did it involve sudden or severe rash/hives, skin peeling, or any reaction on the inside of your mouth or nose? y Did you need to seek medical attention at a hospital or doctor's office? n When did it last happen?  1985 If all above answers are NO, may proceed with cephalosporin use.  Tolerated Ceftriaxone  10/05/20 - 10/07/20   Oxycodone Other (See Comments)    Patient in recovery process.     Home Medications  Prior to Admission medications   Medication Sig Start Date End Date Taking? Authorizing Provider  acetaminophen  (TYLENOL ) 500 MG tablet Take 2 tablets (1,000 mg total) by mouth every 6 (six) hours as needed. 04/05/21  Yes Drusilla Sabas RAMAN, MD  clonazePAM  (KLONOPIN ) 0.5 MG tablet Take 1 tablet (0.5 mg total) by mouth 3 (three) times daily as needed for anxiety. 04/15/21  Yes Barbarann Oneil BROCKS, MD  cyanocobalamin  (VITAMIN B12) 100 MCG tablet Take 100 mcg by mouth daily.   Yes [provider]  diphenhydrAMINE  (BENADRYL ) 50 MG tablet Take 150 mg by mouth at bedtime as needed.   Yes [provider]  FLUoxetine  (PROZAC ) 40 MG capsule Take 40 mg by mouth daily.   Yes [provider]  gabapentin  (NEURONTIN ) 800 MG tablet Take 800 mg by mouth 3 (three) times daily.   Yes [provider]  levothyroxine (SYNTHROID) 50 MCG tablet Take 50 mcg by mouth daily.   Yes [provider]  lisinopril  (ZESTRIL ) 10 MG tablet Take 10 mg by mouth as needed. Taking when blood pressure is 160's (abnormal will take) 04/20/21  Yes [provider]  metoprolol  tartrate (LOPRESSOR )  100 MG tablet Take 1 tablet (100 mg total) by mouth 2 (two) times daily. 04/05/21  Yes Drusilla Sabas RAMAN, MD  Multiple Vitamins-Minerals (CENTRUM SILVER 50+WOMEN) TABS Take 1 tablet by mouth daily.   Yes [provider]  ondansetron  (ZOFRAN -ODT) 4 MG disintegrating tablet Dissolve 1 tablet under the tongue every 8 (eight) hours as needed. 07/01/23  Yes Jerrol Agent, MD  oxyCODONE-acetaminophen  (PERCOCET) 7.5-325 MG tablet Take 1 tablet by mouth every 6 (six) hours as needed for severe pain (pain score 7-10).   Yes [provider]  pantoprazole  (PROTONIX ) 40 MG tablet Take 1 tablet (40 mg total) by mouth daily at 6 (six) AM. 10/08/20  Yes Sebastian Toribio GAILS, MD  SUMAtriptan (IMITREX) 100 MG tablet Take 100 mg by mouth once. 06/03/23  Yes [provider]  tiZANidine  (ZANAFLEX ) 4 MG tablet Take 4 mg by mouth 3 (three) times daily as needed.   Yes [provider]  traZODone  (DESYREL ) 150 MG tablet Take 150 mg by mouth at bedtime as needed for sleep. 03/24/24  Yes [provider]  methenamine (HIPREX) 1 g tablet Take 1 g by mouth 2 (two) times daily. Patient not taking: Reported on 05/14/2024 03/29/24   [provider]     Critical care time: 83    Tamela Stakes, MD  Attending Physician, Critical Care Medicine [facility] See Amion for pager If no response to pager, please call [phone] until 7pm After 7pm, Please call E-link [phone]

## 2024-05-16 NOTE — Progress Notes (Addendum)
 ICU Consult    NAME:  Stephanie Carrillo, MRN:  [id number], DOB:  [birth year], LOS: 2 ADMISSION DATE:  05/13/2024,  CONSULTATION DATE:  05/16/24 REFERRING NP:,Damaria Stofko Blondie ACNPC  CHIEF COMPLAINT:  Called code Respiratory arrest    History of present illness    Admitted through ER from home for evaluation of nausea and vomiting, dysuria.  Labs Pending  CXR -available Initial ABG available, repeat ABG -pending   Past Medical History  68 year old female medical history significant for HTN, HLD, recurrent UTI, hypothyroidism, depression/anxiety, chronic myelopathy history C5/C7 ACDF, history of lumbar decompression C/B CSF leak and meningitis, chronic pain, substance abuse.   Patient most recently admitted at [facility] 9/24-9/28 with sepsis due to MDR E. coli UTI. At that time discharged on nitrofurantoin .  It was noted at that time that she had strokelike symptoms with dysarthria.  Stroke workup was negative  Significant Hospital Events   Code called on medical floor, relayed as potential preceding seizure. No seizure seen on arrival, no postictal state, patient was being assisted with BVM, did not lose pulse. No CPR was performed Patient was hypotensive as low as 50s recorded. Levophed, and fluid bolus started.  Consults:  CCM-Dr. Gatha Kerns notified.  Procedures:  None  Significant Diagnostic Tests:  Chest x-ray ABG Laboratory drawl-CMP mag, CBC, lactic acid x 3, troponin, EKG, ABG-initial response, ABG repeat after 1 hour pending.  Micro Data:  Staphylococcus species, Staphylococcus epidermis Patient most recently admitted at [facility] 9/24-9/28 with sepsis due to MDR E. coli UTI. At that time discharged on nitrofurantoin .  It was noted at that time that she had strokelike symptoms with dysarthria.  Stroke workup was  negative  Antimicrobials:  Rocephin  2 g and sodium chloride  100 mL IVPB every 24 hours Did receive vancomycin  IVPB 1750 mg on 05/15/2024 at 1815 hrs. last dose-now discontinued  Interim history/subjective:  Currently awake and oriented-Levophed is being titrated off.  Objective   Blood pressure (!) 161/76, pulse 71, temperature 98.2 F (36.8 C), temperature source Oral, resp. rate 20, height 5' 6 (1.676 m), weight 82.5 kg, SpO2 96%.        Intake/Output Summary (Last 24 hours) at 05/16/2024 2047 Last data filed at 05/16/2024 0200 Gross per 24 hour  Intake 371.72 ml  Output --  Net 371.72 ml   Filed Weights   05/13/24 1959 05/14/24 1633  Weight: 79.4 kg 82.5 kg    Examination: General: alert/oriented x3, lethargic. Lungs: normal work of breathing Cardiovascular: RRR, no murmur, no JVD  Abdomen: soft, nontender, normal bowel sounds Extremities: warm, well-perfused without cyanosis, edema Neuro: grossly nonfocal, follows commands  Resolved Hospital  Problem list   Blood pressure Respiratory effort  Assessment & Plan:  CCM consult Transferred to ICU E-link notified Follow-up ABG/pending  Best practice:  Diet: Heart healthy although pending RN bedside swallow study/assessment Pain/Anxiety/Delirium protocol/active DVT prophylaxis: Lovenox  40 mg daily GI prophylaxis: Protonix  40 mg EC tablet daily Glucose control: SS as needed Mobility: As tolerated Code Status: Full code Disposition: Transferred to ICU room and consult called to CCM.  Labs   CBC: Recent Labs  Lab 05/13/24 2040 05/14/24 0533 05/15/24 0512 05/16/24 1150 05/16/24 1549  WBC 17.2* 23.2* 16.1* 28.6* 27.3*  NEUTROABS 15.9*  --  14.0* 24.9* 21.3*  HGB 15.0 12.9 12.2 7.6* 8.0*  HCT 45.2 39.9 34.9* 21.1* 22.5*  MCV 100.4* 105.0* 98.6 99.1 98.7  PLT 440* 356 364 244 278    Basic Metabolic Panel: Recent Labs  Lab 05/13/24 2040 05/14/24 0533 05/15/24 0512  NA 136 136 134*  K 4.4 4.0 3.6   CL 99 99 98  CO2 19* 23 18*  GLUCOSE 202* 140* 89  BUN 18 15 15   CREATININE 0.78 0.76 0.68  CALCIUM  11.1* 10.0 9.0   GFR: Estimated Creatinine Clearance: 72.9 mL/min (by C-G formula based on SCr of 0.68 mg/dL). Recent Labs  Lab 05/13/24 2050 05/14/24 0533 05/14/24 0920 05/15/24 0512 05/16/24 1150 05/16/24 1549  WBC  --  23.2*  --  16.1* 28.6* 27.3*  LATICACIDVEN 3.4*  --  1.5  --   --   --     Liver Function Tests: Recent Labs  Lab 05/13/24 2040  AST 31  ALT 14  ALKPHOS 118  BILITOT 0.8  PROT 9.6*  ALBUMIN 5.1*   Recent Labs  Lab 05/14/24 1722  LIPASE 51   No results for input(s): AMMONIA in the last 168 hours.  ABG    Component Value Date/Time   PHART 7.14 (LL) 05/16/2024 1957   PCO2ART 21 (L) 05/16/2024 1957   PO2ART 141 (H) 05/16/2024 1957   HCO3 7.1 (L) 05/16/2024 1957   TCO2 30 03/17/2021 1610   ACIDBASEDEF 19.8 (H) 05/16/2024 1957   O2SAT 100 05/16/2024 1957     Coagulation Profile: Recent Labs  Lab 05/13/24 0050  INR 1.1    Cardiac Enzymes: No results for input(s): CKTOTAL, CKMB, CKMBINDEX, TROPONINI in the last 168 hours.  HbA1C: Hgb A1c MFr Bld  Date/Time Value Ref Range Status  10/26/2020 02:08 PM 5.1 <5.7 % of total Hgb Final    Comment:    For the purpose of screening for the presence of diabetes: . <5.7%       Consistent with the absence of diabetes 5.7-6.4%    Consistent with increased risk for diabetes             (prediabetes) > or =6.5%  Consistent with diabetes . This assay result is consistent with a decreased risk of diabetes. . Currently, no consensus exists regarding use of hemoglobin A1c for diagnosis of diabetes in children. . According to American Diabetes Association (ADA) guidelines, hemoglobin A1c <7.0% represents optimal control in non-pregnant diabetic patients. Different metrics may apply to specific patient populations.  Standards of Medical Care in Diabetes(ADA). SABRA   02/24/2020 04:51 AM  4.9 4.8 - 5.6 % Final    Comment:    (NOTE) Pre diabetes:          5.7%-6.4%  Diabetes:              >6.4%  Glycemic control for   <7.0% adults with diabetes  CBG: No results for input(s): GLUCAP in the last 168 hours.  Past Medical History  She,  has a past medical history of Anxiety, Arthritis, Chronic kidney disease, Depression, GERD (gastroesophageal reflux disease), Heart murmur, Hypertension, Insomnia, Meningitis, Pneumonia, Recurrent UTI, Seizures (HCC), and Substance abuse (HCC).   Surgical History    Past Surgical History:  Procedure Laterality Date   BACK SURGERY     CERVICAL DISC SURGERY     FRACTURE SURGERY N/A    [employer]reesia 05/07/2020   SPINE SURGERY N/A    Phreesia 05/07/2020     Social History   reports that she has quit smoking. Her smoking use included cigarettes. She has a 30 pack-year smoking history. She has never used smokeless tobacco. She reports that she does not currently use alcohol. She reports that she does not currently use drugs.   Family History   Her family history includes Cancer in her brother, father, and mother; Colon polyps in her brother and brother; Heart disease in her father; Lung cancer in her father; Multiple sclerosis in her mother; Prostate cancer in her brother; Skin cancer in her brother; Testicular cancer in her brother.   Allergies Allergies  Allergen Reactions   Ciprofloxacin Shortness Of Breath    Respiratory arrest   Penicillins Shortness Of Breath and Rash    Did it involve swelling of the face/tongue/throat, SOB, or low BP? y Did it involve sudden or severe rash/hives, skin peeling, or any reaction on the inside of your mouth or nose? y Did you need to seek medical attention at a hospital or doctor's office? n When did it last happen?  1985 If all above answers are NO, may proceed with cephalosporin use.  Tolerated Ceftriaxone  10/05/20 - 10/07/20   Oxycodone Other (See Comments)    Patient in recovery process.      Home Medications  Prior to Admission medications   Medication Sig Start Date End Date Taking? Authorizing Provider  acetaminophen  (TYLENOL ) 500 MG tablet Take 2 tablets (1,000 mg total) by mouth every 6 (six) hours as needed. 04/05/21  Yes Drusilla Sabas RAMAN, MD  clonazePAM  (KLONOPIN ) 0.5 MG tablet Take 1 tablet (0.5 mg total) by mouth 3 (three) times daily as needed for anxiety. 04/15/21  Yes Barbarann Oneil BROCKS, MD  cyanocobalamin  (VITAMIN B12) 100 MCG tablet Take 100 mcg by mouth daily.   Yes [provider]  diphenhydrAMINE  (BENADRYL ) 50 MG tablet Take 150 mg by mouth at bedtime as needed.   Yes [provider]  FLUoxetine  (PROZAC ) 40 MG capsule Take 40 mg by mouth daily.   Yes [provider]  gabapentin  (NEURONTIN ) 800 MG tablet Take 800 mg by mouth 3 (three) times daily.   Yes [provider]  levothyroxine (SYNTHROID) 50 MCG tablet Take 50 mcg by mouth daily.   Yes [provider]  lisinopril  (ZESTRIL ) 10 MG tablet Take 10 mg by mouth as needed. Taking when blood pressure is 160's (abnormal will take) 04/20/21  Yes [provider]  metoprolol  tartrate (LOPRESSOR ) 100 MG tablet Take 1 tablet (100 mg total) by mouth 2 (two) times daily. 04/05/21  Yes Drusilla Sabas RAMAN, MD  Multiple Vitamins-Minerals (CENTRUM SILVER 50+WOMEN) TABS Take 1 tablet by mouth daily.   Yes [provider]  ondansetron  (ZOFRAN -ODT) 4 MG disintegrating tablet Dissolve 1 tablet under the tongue every 8 (eight) hours as needed. 07/01/23  Yes Jerrol Agent, MD  oxyCODONE-acetaminophen  (PERCOCET) 7.5-325 MG tablet Take 1 tablet by mouth every 6 (six)  hours as needed for severe pain (pain score 7-10).   Yes [provider]  pantoprazole  (PROTONIX ) 40 MG tablet Take 1 tablet (40 mg total) by mouth daily at 6 (six) AM. 10/08/20  Yes Sebastian Toribio GAILS, MD  SUMAtriptan (IMITREX) 100 MG tablet Take 100 mg by mouth once. 06/03/23  Yes [provider]  tiZANidine   (ZANAFLEX ) 4 MG tablet Take 4 mg by mouth 3 (three) times daily as needed.   Yes [provider]  traZODone  (DESYREL ) 150 MG tablet Take 150 mg by mouth at bedtime as needed for sleep. 03/24/24  Yes [provider]  methenamine (HIPREX) 1 g tablet Take 1 g by mouth 2 (two) times daily. Patient not taking: Reported on 05/14/2024 03/29/24   [provider]     Lynwood Kipper MSNA MSN ACNPC-AG Acute Care Nurse Practitioner [facility]

## 2024-05-16 NOTE — TOC Initial Note (Signed)
 Transition of Care [facility]) - Initial/Assessment Note   Patient Details  Name: Stephanie Carrillo MRN: [id number] Date of Birth: [birth year]  Transition of Care [facility]) CM/SW Contact:    Duwaine GORMAN Aran, LCSW Phone Number: 05/16/2024, 1:08 PM  Clinical Narrative: Patient is from home alone. Patient is currently on IV antibiotics. Care management following for possible discharge needs.  Expected Discharge Plan: Home/Self Care Barriers to Discharge: Continued Medical Work up  Expected Discharge Plan and Services In-house Referral: Clinical Social Work Living arrangements for the past 2 months: Apartment          DME Arranged: N/A DME Agency: NA  Prior Living Arrangements/Services Living arrangements for the past 2 months: Apartment Lives with:: Self Patient language and need for interpreter reviewed:: Yes Do you feel safe going back to the place where you live?: Yes      Need for Family Participation in Patient Care: No (Comment) Care giver support system in place?: Yes (comment) Criminal Activity/Legal Involvement Pertinent to Current Situation/Hospitalization: No - Comment as needed  Activities of Daily Living ADL Screening (condition at time of admission) Independently performs ADLs?: Yes (appropriate for developmental age) Is the patient deaf or have difficulty hearing?: No Does the patient have difficulty seeing, even when wearing glasses/contacts?: No Does the patient have difficulty concentrating, remembering, or making decisions?: No  Emotional Assessment Orientation: : Oriented to Self, Oriented to Place Alcohol / Substance Use: Not Applicable Psych Involvement: No (comment)  Admission diagnosis:  Dysarthria [R47.1] Acute cystitis without hematuria [N30.00] Altered mental status, unspecified altered mental status type [R41.82] Sepsis with encephalopathy without septic shock, due to unspecified organism (HCC) [A41.9, R65.20, G93.41] Severe sepsis with lactic acidosis (HCC)  [A41.9, R65.20, E87.20] Patient Active Problem List   Diagnosis Date Noted   Hypercalcemia 05/14/2024   Hypothyroidism 05/14/2024   Hypomagnesemia 04/08/2023   Hyperlipidemia 04/08/2023   Sepsis secondary to UTI (HCC) 03/13/2022   Metabolic acidosis 03/13/2022   Mixed stress and urge urinary incontinence 06/17/2021   Vaginal atrophy 06/17/2021   E coli bacteremia    Severe sepsis with lactic acidosis (HCC) 03/28/2021   Medication withdrawal (HCC) 03/18/2021   Hypertension 03/17/2021   Acute encephalopathy 03/17/2021   Fall    Complicated UTI (urinary tract infection) 10/04/2020   Nausea vomiting and diarrhea    Acute metabolic encephalopathy 02/23/2020   Benzodiazepine withdrawal (HCC) 02/01/2020   Hypokalemia    Tachycardia    Altered mental state 01/27/2020   Anxiety and depression 09/23/2018   Insomnia 09/23/2018   Chronic pain syndrome 09/23/2018   PCP:  Hughie Sharper, MD Pharmacy:   [id number] - [place], [place] - [address] AT [facility] OF [address] [place] [address] Phone: [phone] Fax: [phone]  [id number] [place], [place] - [address] AT [facility] OF [address] & [facility] [address] [place] [address] Phone: [phone] Fax: [phone]  [facility] [address] [place] [address] Phone: [phone] Fax: [phone]  Social Drivers of Health (SDOH) Social History: SDOH Screenings   Food Insecurity: No Food Insecurity (05/14/2024)  Housing: Low Risk  (05/14/2024)  Transportation Needs: No Transportation Needs (05/14/2024)  Utilities: Not At Risk (05/14/2024)  Depression (PHQ2-9): Medium Risk (02/05/2021)  Financial Resource Strain: Low Risk  (04/28/2024)   Received from [facility]  Physical Activity: Inactive (11/07/2023)   Received from [facility]  Social  Connections: Socially Isolated (11/07/2023)   Received from [facility]  Stress: Stress Concern Present (11/07/2023)   Received from [facility]  Tobacco Use: Medium Risk (05/13/2024)  Health Literacy: Adequate Health Literacy (11/07/2023)   Received from [facility]   SDOH Interventions:    Readmission Risk Interventions    04/09/2023   10:24 AM  Readmission Risk Prevention Plan  Transportation Screening Complete  PCP or Specialist Appt within 3-5 Days Complete  HRI or Home Care Consult Complete  Social Work Consult for Recovery Care Planning/Counseling Complete  Palliative Care Screening Not Applicable  Medication Review Oceanographer) Complete

## 2024-05-16 NOTE — Progress Notes (Addendum)
 PROGRESS NOTE    Stephanie Carrillo  FMW:[id number] DOB: 1955-08-08 DOA: 05/13/2024 PCP: Hughie Sharper, MD   Brief Narrative:  Stephanie Carrillo is a 68 y.o. female with medical history significant for HTN, HLD, recurrent UTI, hypothyroidism, depression/anxiety, chronic myelopathy hx C5-7 ACDF, hx of lumbar decompression c/b CSF leak and meningitis, chronic pain, substance use who presented to the ED from home for evaluation of nausea and vomiting, dysuria.   Patient with a history of recurrent UTI with multiple admissions for urosepsis.  Most recently admitted at [facility] 9/24-9/28 with sepsis due to MDR E. coli UTI.  She was initially treated with IV cefepime .  Urine culture on 9/24 showed sensitivity to ceftriaxone  and nitrofurantoin .  She was discharged on oral nitrofurantoin .  It was noted that she had strokelike symptoms with dysarthria.  She had negative stroke workup and it was felt that the symptoms were secondary to sepsis/UTI.   Upon arrival to ED, she was hemodynamically stable.  Labs showed WBC 17.2, hemoglobin 15.0, platelets 440, sodium 136, potassium 4.4, bicarb 19, BUN 18, creatinine 0.78, serum glucose 202, albumin 5.1, calcium  11.1, LFTs within normal limits, lactic acid 3.4. Portable chest x-ray showed linear atelectasis in the lung bases.  No focal consolidation, edema, effusion.CTA head/neck negative for acute intracranial abnormality, no LVO or approximately significant stenosis.Patient was given 2 L LR, IV ceftriaxone .  The hospitalist service was consulted for admission.  Assessment & Plan:   Principal Problem:   Severe sepsis with lactic acidosis (HCC) Active Problems:   Anxiety and depression   Chronic pain syndrome   Acute metabolic encephalopathy   Hypertension   Hypercalcemia   Hypothyroidism  Severe sepsis secondary to presumed UTI and MRSE, POA: Patient presenting with leukocytosis, tachycardia, lactic acidosis.  Although UA is not impressive but presumed source is  urine given history, UA is likely low yield due to patient receiving antibiotics prior to collection of sample.  Last admitted at [facility] at end of September with sepsis due to MDR E. coli UTI.  Urine cultures showed sensitivity to ceftriaxone  last hospitalization but culture was not sent to this hospitalization.  Also, only 1 set of blood culture was drawn in the ED which is growing MRSE, for which she was also started on vancomycin .  Repeat blood cultures were drawn 05/14/2024 and are negative so far.  Continue current antibiotics and follow final culture reports.  Lactic acidosis resolved.   Hypercalcemia: Calcium  11.1 on admission.  Resolved with hydration.   Acute metabolic encephalopathy: Secondary to sepsis/infectious process.  Similar presentation when admitted at [facility] recently with dysarthria and word finding difficulty.  Had negative stroke workup and symptoms resolved with treatment of sepsis.  Patient was fully alert and oriented yesterday.  I resumed her Klonopin  and gabapentin .  Per reports from nursing, patient was confused this morning.  When I saw her, she was alert and oriented to self and place but not to year or the month, appears to be slightly confused.  Data indicates that polypharmacy is likely the source of her acute metabolic encephalopathy which has been recurrent now.  I have now discontinued Klonopin  and gabapentin  both.   Hypertension/sinus tachycardia: Blood pressure fairly controlled now, patient only on metoprolol  100 mg p.o. twice daily PTA which has been resumed.   Hypothyroidism: Continue Synthroid.   Chronic pain and myelopathy/restless leg syndrome: Resumed Home Percocet.  Discontinue gabapentin  as above.  She is on very high dose at 20 mg 3 times daily.  Once encephalopathy clears, will  resume gabapentin  at much lower dose.   Depression/anxiety: Holding Klonopin  due to altered mental status.  Migraine headache: Resumed Imitrex.  Diarrhea: Although no  abdominal pain, tenderness or fever but patient now has significantly worsened leukocytosis from 16,000 yesterday to 28,000 today.  This raises concern for possible C. difficile infection.  Will test for that.  Acute anemia: Hemoglobin has been between 12 and 15 and has dropped to 7.6 today.  Need to repeat CBC to make sure the results are authentic.  DVT prophylaxis: enoxaparin  (LOVENOX ) injection 40 mg Start: 05/14/24 1000   Code Status: Full Code  Family Communication:  None present at bedside.  Plan of care discussed with patient in length and he/she verbalized understanding and agreed with it.  Status is: Inpatient Remains inpatient appropriate because: Needs IV antibiotics, confused today as well.   Estimated body mass index is 29.34 kg/m as calculated from the following:   Height as of this encounter: 5' 6 (1.676 m).   Weight as of this encounter: 82.5 kg.    Nutritional Assessment: Body mass index is 29.34 kg/m.SABRA Seen by dietician.  I agree with the assessment and plan as outlined below: Nutrition Status:   Skin Assessment: I have examined the patient's skin and I agree with the wound assessment as performed by the wound care RN as outlined below:    Consultants:  None  Procedures:  None  Antimicrobials:  Anti-infectives (From admission, onward)    Start     Dose/Rate Route Frequency Ordered Stop   05/14/24 2200  cefTRIAXone  (ROCEPHIN ) 2 g in sodium chloride  0.9 % 100 mL IVPB        2 g 200 mL/hr over 30 Minutes Intravenous Every 24 hours 05/14/24 0052 05/21/24 2159   05/14/24 1930  vancomycin  (VANCOREADY) IVPB 1750 mg/350 mL  Status:  Discontinued        1,750 mg 175 mL/hr over 120 Minutes Intravenous Every 24 hours 05/14/24 1841 05/16/24 1042   05/13/24 2315  ceFEPIme  (MAXIPIME ) 2 g in sodium chloride  0.9 % 100 mL IVPB  Status:  Discontinued        2 g 200 mL/hr over 30 Minutes Intravenous  Once 05/13/24 2307 05/14/24 0044   05/13/24 2200  cefTRIAXone   (ROCEPHIN ) 2 g in sodium chloride  0.9 % 100 mL IVPB  Status:  Discontinued        2 g 200 mL/hr over 30 Minutes Intravenous Once 05/13/24 2157 05/13/24 2240         Subjective: Patient seen and examined.  She was slightly confused.  Was complaining of diarrhea.  No abdominal pain nausea.  No abdominal tenderness.  Objective: Vitals:   05/16/24 0200 05/16/24 0505 05/16/24 0650 05/16/24 0834  BP:  105/64  130/68  Pulse:  80  79  Resp: 20 19 19    Temp:  98.2 F (36.8 C)    TempSrc:  Oral    SpO2:  92%    Weight:      Height:        Intake/Output Summary (Last 24 hours) at 05/16/2024 1325 Last data filed at 05/16/2024 0200 Gross per 24 hour  Intake 494.01 ml  Output --  Net 494.01 ml   Filed Weights   05/13/24 1959 05/14/24 1633  Weight: 79.4 kg 82.5 kg    Examination:  General exam: Appears calm and comfortable  Respiratory system: Clear to auscultation. Respiratory effort normal. Cardiovascular system: S1 & S2 heard, RRR. No JVD, murmurs, rubs, gallops or clicks. No  pedal edema. Gastrointestinal system: Abdomen is nondistended, soft and nontender. No organomegaly or masses felt. Normal bowel sounds heard. Central nervous system: Alert and oriented x 2. No focal neurological deficits. Extremities: Symmetric 5 x 5 power. Skin: No rashes, lesions or ulcers.   Data Reviewed: I have personally reviewed following labs and imaging studies  CBC: Recent Labs  Lab 05/13/24 2040 05/14/24 0533 05/15/24 0512 05/16/24 1150  WBC 17.2* 23.2* 16.1* 28.6*  NEUTROABS 15.9*  --  14.0* 24.9*  HGB 15.0 12.9 12.2 7.6*  HCT 45.2 39.9 34.9* 21.1*  MCV 100.4* 105.0* 98.6 99.1  PLT 440* 356 364 244   Basic Metabolic Panel: Recent Labs  Lab 05/13/24 2040 05/14/24 0533 05/15/24 0512  NA 136 136 134*  K 4.4 4.0 3.6  CL 99 99 98  CO2 19* 23 18*  GLUCOSE 202* 140* 89  BUN 18 15 15   CREATININE 0.78 0.76 0.68  CALCIUM  11.1* 10.0 9.0   GFR: Estimated Creatinine Clearance:  72.9 mL/min (by C-G formula based on SCr of 0.68 mg/dL). Liver Function Tests: Recent Labs  Lab 05/13/24 2040  AST 31  ALT 14  ALKPHOS 118  BILITOT 0.8  PROT 9.6*  ALBUMIN 5.1*   Recent Labs  Lab 05/14/24 1722  LIPASE 51   No results for input(s): AMMONIA in the last 168 hours. Coagulation Profile: Recent Labs  Lab 05/13/24 0050  INR 1.1   Cardiac Enzymes: No results for input(s): CKTOTAL, CKMB, CKMBINDEX, TROPONINI in the last 168 hours. BNP (last 3 results) No results for input(s): PROBNP in the last 8760 hours. HbA1C: No results for input(s): HGBA1C in the last 72 hours. CBG: No results for input(s): GLUCAP in the last 168 hours. Lipid Profile: No results for input(s): CHOL, HDL, LDLCALC, TRIG, CHOLHDL, LDLDIRECT in the last 72 hours. Thyroid  Function Tests: No results for input(s): TSH, T4TOTAL, FREET4, T3FREE, THYROIDAB in the last 72 hours. Anemia Panel: No results for input(s): VITAMINB12, FOLATE, FERRITIN, TIBC, IRON, RETICCTPCT in the last 72 hours. Sepsis Labs: Recent Labs  Lab 05/13/24 2050 05/14/24 0920  LATICACIDVEN 3.4* 1.5    Recent Results (from the past 240 hours)  Blood culture (routine x 2)     Status: Abnormal   Collection Time: 05/13/24  8:59 PM   Specimen: BLOOD  Result Value Ref Range Status   Specimen Description   Final    BLOOD LEFT ANTECUBITAL Performed at [facility], [address] [place], [place] [address]    Special Requests   Final    BOTTLES DRAWN AEROBIC AND ANAEROBIC Blood Culture results may not be optimal due to an inadequate volume of blood received in culture bottles Performed at [facility], [address], [place], [place] [address]    Culture  Setup Time   Final    GRAM POSITIVE COCCI IN CLUSTERS IN BOTH AEROBIC AND ANAEROBIC BOTTLES CRITICAL RESULT CALLED TO, READ BACK BY AND VERIFIED WITH: PHARMD CRYSTAL ROBERTSON  [id number] AT 1823 BY EC    Culture (A)  Final    STAPHYLOCOCCUS EPIDERMIDIS STAPHYLOCOCCUS HOMINIS THE SIGNIFICANCE OF ISOLATING THIS ORGANISM FROM A SINGLE SET OF BLOOD CULTURES WHEN MULTIPLE SETS ARE DRAWN IS UNCERTAIN. PLEASE NOTIFY THE MICROBIOLOGY DEPARTMENT WITHIN ONE WEEK IF SPECIATION AND SENSITIVITIES ARE REQUIRED. Performed at [facility], [address], [place], [place] [address]    Report Status 05/16/2024 FINAL  Final  Blood Culture ID Panel (Reflexed)     Status: Abnormal  Collection Time: 05/13/24  8:59 PM  Result Value Ref Range Status   Enterococcus faecalis NOT DETECTED NOT DETECTED Final   Enterococcus Faecium NOT DETECTED NOT DETECTED Final   Listeria monocytogenes NOT DETECTED NOT DETECTED Final   Staphylococcus species DETECTED (A) NOT DETECTED Final    Comment: CRITICAL RESULT CALLED TO, READ BACK BY AND VERIFIED WITH: PHARMD CRYSTAL ROBERTSON [id number] AT 1823 BY EC    Staphylococcus aureus (BCID) NOT DETECTED NOT DETECTED Final   Staphylococcus epidermidis DETECTED (A) NOT DETECTED Final    Comment: Methicillin (oxacillin) resistant coagulase negative staphylococcus. Possible blood culture contaminant (unless isolated from more than one blood culture draw or clinical case suggests pathogenicity). No antibiotic treatment is indicated for blood  culture contaminants. CRITICAL RESULT CALLED TO, READ BACK BY AND VERIFIED WITH: PHARMD CRYSTAL ROBERTSON [id number] AT 1823 BY EC    Staphylococcus lugdunensis NOT DETECTED NOT DETECTED Final   Streptococcus species NOT DETECTED NOT DETECTED Final   Streptococcus agalactiae NOT DETECTED NOT DETECTED Final   Streptococcus pneumoniae NOT DETECTED NOT DETECTED Final   Streptococcus pyogenes NOT DETECTED NOT DETECTED Final   A.calcoaceticus-baumannii NOT DETECTED NOT DETECTED Final   Bacteroides fragilis NOT DETECTED NOT DETECTED Final   Enterobacterales NOT DETECTED NOT DETECTED Final   Enterobacter cloacae complex  NOT DETECTED NOT DETECTED Final   Escherichia coli NOT DETECTED NOT DETECTED Final   Klebsiella aerogenes NOT DETECTED NOT DETECTED Final   Klebsiella oxytoca NOT DETECTED NOT DETECTED Final   Klebsiella pneumoniae NOT DETECTED NOT DETECTED Final   Proteus species NOT DETECTED NOT DETECTED Final   Salmonella species NOT DETECTED NOT DETECTED Final   Serratia marcescens NOT DETECTED NOT DETECTED Final   Haemophilus influenzae NOT DETECTED NOT DETECTED Final   Neisseria meningitidis NOT DETECTED NOT DETECTED Final   Pseudomonas aeruginosa NOT DETECTED NOT DETECTED Final   Stenotrophomonas maltophilia NOT DETECTED NOT DETECTED Final   Candida albicans NOT DETECTED NOT DETECTED Final   Candida auris NOT DETECTED NOT DETECTED Final   Candida glabrata NOT DETECTED NOT DETECTED Final   Candida krusei NOT DETECTED NOT DETECTED Final   Candida parapsilosis NOT DETECTED NOT DETECTED Final   Candida tropicalis NOT DETECTED NOT DETECTED Final   Cryptococcus neoformans/gattii NOT DETECTED NOT DETECTED Final   Methicillin resistance mecA/C DETECTED (A) NOT DETECTED Final    Comment: CRITICAL RESULT CALLED TO, READ BACK BY AND VERIFIED WITH: [facility] CRYSTAL ROBERTSON [id number] AT 1823 BY EC Performed at [facility], [address], [place], [place] [address]   Resp panel by RT-PCR (RSV, Flu A&B, Covid) Anterior Nasal Swab     Status: None   Collection Time: 05/14/24  1:50 AM   Specimen: Anterior Nasal Swab  Result Value Ref Range Status   SARS Coronavirus 2 by RT PCR NEGATIVE NEGATIVE Final    Comment: (NOTE) SARS-CoV-2 target nucleic acids are NOT DETECTED.  The SARS-CoV-2 RNA is generally detectable in upper respiratory specimens during the acute phase of infection. The lowest concentration of SARS-CoV-2 viral copies this assay can detect is 138 copies/mL. A negative result does not preclude SARS-Cov-2 infection and should not be used as the sole basis for treatment or other patient  management decisions. A negative result may occur with  improper specimen collection/handling, submission of specimen other than nasopharyngeal swab, presence of viral mutation(s) within the areas targeted by this assay, and inadequate number of viral copies(<138 copies/mL). A negative result must be combined with clinical observations, patient history, and  epidemiological information. The expected result is Negative.  Fact Sheet for Patients:  BloggerCourse.com  Fact Sheet for Healthcare Providers:  SeriousBroker.it  This test is no t yet approved or cleared by the [place]  FDA and  has been authorized for detection and/or diagnosis of SARS-CoV-2 by FDA under an Emergency Use Authorization (EUA). This EUA will remain  in effect (meaning this test can be used) for the duration of the COVID-19 declaration under Section 564(b)(1) of the Act, 21 U.S.C.section 360bbb-3(b)(1), unless the authorization is terminated  or revoked sooner.       Influenza A by PCR NEGATIVE NEGATIVE Final   Influenza B by PCR NEGATIVE NEGATIVE Final    Comment: (NOTE) The Xpert Xpress SARS-CoV-2/FLU/RSV plus assay is intended as an aid in the diagnosis of influenza from Nasopharyngeal swab specimens and should not be used as a sole basis for treatment. Nasal washings and aspirates are unacceptable for Xpert Xpress SARS-CoV-2/FLU/RSV testing.  Fact Sheet for Patients: BloggerCourse.com  Fact Sheet for Healthcare Providers: SeriousBroker.it  This test is not yet approved or cleared by the [place]  FDA and has been authorized for detection and/or diagnosis of SARS-CoV-2 by FDA under an Emergency Use Authorization (EUA). This EUA will remain in effect (meaning this test can be used) for the duration of the COVID-19 declaration under Section 564(b)(1) of the Act, 21 U.S.C. section 360bbb-3(b)(1),  unless the authorization is terminated or revoked.     Resp Syncytial Virus by PCR NEGATIVE NEGATIVE Final    Comment: (NOTE) Fact Sheet for Patients: BloggerCourse.com  Fact Sheet for Healthcare Providers: SeriousBroker.it  This test is not yet approved or cleared by the [place]  FDA and has been authorized for detection and/or diagnosis of SARS-CoV-2 by FDA under an Emergency Use Authorization (EUA). This EUA will remain in effect (meaning this test can be used) for the duration of the COVID-19 declaration under Section 564(b)(1) of the Act, 21 U.S.C. section 360bbb-3(b)(1), unless the authorization is terminated or revoked.  Performed at [facility], [address] [place], [place] [address]   Blood culture (routine x 2)     Status: None (Preliminary result)   Collection Time: 05/14/24  7:19 PM   Specimen: BLOOD RIGHT HAND  Result Value Ref Range Status   Specimen Description BLOOD RIGHT HAND  Final   Special Requests   Final    BOTTLES DRAWN AEROBIC ONLY Blood Culture results may not be optimal due to an inadequate volume of blood received in culture bottles   Culture   Final    NO GROWTH 2 DAYS Performed at [facility], [address] [place], [place] [address]    Report Status PENDING  Incomplete     Radiology Studies: No results found.   Scheduled Meds:  enoxaparin  (LOVENOX ) injection  40 mg Subcutaneous Q24H   feeding supplement  237 mL Oral BID BM   FLUoxetine   40 mg Oral Daily   levothyroxine  50 mcg Oral Q0600   metoprolol  tartrate  100 mg Oral BID   pantoprazole   40 mg Oral Daily   rosuvastatin   10 mg Oral QHS   sodium chloride  flush  3 mL Intravenous Q12H   cyanocobalamin   100 mcg Oral Daily   Continuous Infusions:  cefTRIAXone  (ROCEPHIN )  IV Stopped (05/15/24 2230)     LOS: 2 days   Fredia Skeeter, MD [facility]  05/16/2024, 1:25 PM  Total time spent: 47  minutes *Please note that this is a  verbal dictation therefore any spelling or grammatical errors are due to the Dragon Medical One system interpretation.  Please page via Amion and do not message via secure chat for urgent patient care matters. Secure chat can be used for non urgent patient care matters.  How to contact the [facility] Attending or Consulting provider 7A - 7P or covering provider during after hours 7P -7A, for this patient?  Check the care team in [facility] and look for a) attending/consulting [facility] provider listed and b) the [facility] team listed. Page or secure chat 7A-7P. Log into [email or web address] and use [place] [place]'s universal password to access. If you do not have the password, please contact the hospital operator. Locate the [facility] provider you are looking for under [facility] and page to a number that you can be directly reached. If you still have difficulty reaching the provider, please page the [facility] (Director on Call) for the Hospitalists listed on amion for assistance.

## 2024-05-16 NOTE — ED Provider Notes (Signed)
 I was called to a CODE BLUE.  The patient had reportedly had a seizure and perhaps briefly lost a pulse but was agonal he breathing.  However, as we were setting up for intubation, she woke up and was able to speak and follow commands.  Will put her on oxygen but at this point no emergent need for intubation.  Hospitalist is at the bedside to help further resuscitate her as she is hypotensive.   Freddi Hamilton, MD 05/16/24 [phone]

## 2024-05-17 ENCOUNTER — Inpatient Hospital Stay (HOSPITAL_COMMUNITY)

## 2024-05-17 ENCOUNTER — Encounter (HOSPITAL_COMMUNITY): Payer: Self-pay | Admitting: Internal Medicine

## 2024-05-17 DIAGNOSIS — R197 Diarrhea, unspecified: Secondary | ICD-10-CM

## 2024-05-17 DIAGNOSIS — R569 Unspecified convulsions: Secondary | ICD-10-CM | POA: Diagnosis not present

## 2024-05-17 DIAGNOSIS — A419 Sepsis, unspecified organism: Secondary | ICD-10-CM | POA: Diagnosis not present

## 2024-05-17 DIAGNOSIS — R6520 Severe sepsis without septic shock: Secondary | ICD-10-CM | POA: Diagnosis not present

## 2024-05-17 LAB — HIV ANTIBODY (ROUTINE TESTING W REFLEX): HIV Screen 4th Generation wRfx: NONREACTIVE

## 2024-05-17 MED ORDER — IOHEXOL 300 MG/ML  SOLN
100.0000 mL | Freq: Once | INTRAMUSCULAR | Status: AC | PRN
  Administered 2024-05-17: 100 mL via INTRAVENOUS

## 2024-05-17 MED ORDER — MIDAZOLAM HCL 2 MG/2ML IJ SOLN
INTRAMUSCULAR | Status: AC
  Filled 2024-05-17: qty 2

## 2024-05-17 MED ORDER — GLYCOPYRROLATE 0.2 MG/ML IJ SOLN: 0.2000 mg | INTRAMUSCULAR | Status: DC | PRN

## 2024-05-17 MED ORDER — SODIUM CHLORIDE 0.9 % IV SOLN
2.0000 g | Freq: Three times a day (TID) | INTRAVENOUS | Status: DC
  Administered 2024-05-17: 2 g via INTRAVENOUS
  Filled 2024-05-17: qty 12.5

## 2024-05-17 MED ORDER — LORAZEPAM 2 MG/ML IJ SOLN: 1.0000 mg | INTRAMUSCULAR | Status: DC | PRN

## 2024-05-17 MED ORDER — SODIUM CHLORIDE 0.9 % IV SOLN: INTRAVENOUS | Status: DC

## 2024-05-17 MED ORDER — POLYVINYL ALCOHOL 1.4 % OP SOLN: 1.0000 [drp] | Freq: Four times a day (QID) | OPHTHALMIC | Status: DC | PRN

## 2024-05-17 MED ORDER — ACETAMINOPHEN 325 MG PO TABS: 650.0000 mg | ORAL_TABLET | Freq: Four times a day (QID) | ORAL | Status: DC | PRN

## 2024-05-17 MED ORDER — FENTANYL CITRATE (PF) 50 MCG/ML IJ SOSY
PREFILLED_SYRINGE | INTRAMUSCULAR | Status: AC
  Administered 2024-05-17: 50 ug via INTRAVENOUS
  Filled 2024-05-17: qty 2

## 2024-05-17 MED ORDER — SODIUM CHLORIDE 0.9 % IV SOLN: INTRAVENOUS | Status: DC | PRN

## 2024-05-17 MED ORDER — FENTANYL 2500MCG IN NS 250ML (10MCG/ML) PREMIX INFUSION
0.0000 ug/h | INTRAVENOUS | Status: DC
  Administered 2024-05-17: 50 ug/h via INTRAVENOUS
  Filled 2024-05-17: qty 250

## 2024-05-17 MED ORDER — MUPIROCIN 2 % EX OINT: 1.0000 | TOPICAL_OINTMENT | Freq: Two times a day (BID) | CUTANEOUS | Status: DC

## 2024-05-17 MED ORDER — VASOPRESSIN 20 UNITS/100 ML INFUSION FOR SHOCK: 0.0400 [IU]/min | INTRAVENOUS | Status: DC

## 2024-05-17 MED ORDER — GLYCOPYRROLATE 1 MG PO TABS: 1.0000 mg | ORAL_TABLET | ORAL | Status: DC | PRN

## 2024-05-17 MED ORDER — ACETAMINOPHEN 650 MG RE SUPP: 650.0000 mg | Freq: Four times a day (QID) | RECTAL | Status: DC | PRN

## 2024-05-17 MED ORDER — HYDROCORTISONE SOD SUC (PF) 100 MG IJ SOLR
100.0000 mg | Freq: Three times a day (TID) | INTRAMUSCULAR | Status: DC
Start: 2024-05-17 — End: 2024-05-17

## 2024-05-17 MED ORDER — FENTANYL BOLUS VIA INFUSION
100.0000 ug | INTRAVENOUS | Status: DC | PRN
  Administered 2024-05-17: 100 ug via INTRAVENOUS

## 2024-05-17 MED ORDER — FENTANYL CITRATE (PF) 100 MCG/2ML IJ SOLN
INTRAMUSCULAR | Status: AC
  Filled 2024-05-17: qty 2

## 2024-05-17 MED ORDER — LACTATED RINGERS IV BOLUS
500.0000 mL | Freq: Once | INTRAVENOUS | Status: AC
  Administered 2024-05-17: 500 mL via INTRAVENOUS

## 2024-05-17 MED ORDER — FENTANYL CITRATE (PF) 50 MCG/ML IJ SOSY: 50.0000 ug | PREFILLED_SYRINGE | Freq: Once | INTRAMUSCULAR | Status: AC

## 2024-05-17 MED ORDER — SODIUM CHLORIDE 0.9 % IV SOLN: 2.0000 g | INTRAVENOUS | Status: DC

## 2024-05-19 LAB — CULTURE, BLOOD (ROUTINE X 2): Culture: NO GROWTH

## 2024-05-20 ENCOUNTER — Telehealth: Payer: Self-pay | Admitting: Cardiovascular Disease

## 2024-05-20 NOTE — Telephone Encounter (Signed)
 Brother Braden) called to report to Dr. Verlin that patient passed away on 06-10-24.

## 2024-05-22 LAB — CULTURE, BLOOD (ROUTINE X 2)
Culture: NO GROWTH
Special Requests: ADEQUATE

## 2024-06-04 NOTE — Progress Notes (Signed)
 Received return call from patient's brother, Camellia.  Updated him on the patient's significant decline and advised that she is in the dying process. At this point, all we can do is make her comfortable and alleviate suffering as she approaches end of life, transitioning to comfort care.  In agreement and appreciative of efforts to care for her and ensure her comfort and dignity at this juncture.  He will be here in about 3-3.5 hours and understands she will likely die in the interim.   Ronnald Gave MSN, AGACNP-BC [facility] Pulmonary/Critical Care Medicine 06-02-24, 7:44 AM

## 2024-06-04 NOTE — Progress Notes (Addendum)
 Jun 08, 2024 Brother Eric reached by Ronnald Gave and updated on inevitable passing.  Rolan Sharps MD PCCM

## 2024-06-04 NOTE — Plan of Care (Signed)
 Patient had another seizure-like episode. Appreciate neurology input.  Recommend continuous EEG. Neurology recommends moving to [facility] neurocritical care unit with PCCM as primary and neurology consult. Spoke with bed management and put the orders for the patient to be moved to [place] for not neurocritical care unit.  Tamela Stakes, MD  Attending Physician, Critical Care Medicine [place] [facility] Critical Care See Amion for pager If no response to pager, please call [phone] until 7pm After 7pm, Please call E-link [phone]

## 2024-06-04 NOTE — Progress Notes (Signed)
 Physical Therapy Discharge Patient Details Name: Stephanie Carrillo MRN: [id number] DOB: Mar 10, 1956 Today's Date: June 14, 2024 Time:  -     Patient discharged from PT services secondary to medical decline - On comfort care.  [name] PT Acute Rehabilitation Services Office [phone]   GP     [name] Jun 14, 2024, 8:14 AM

## 2024-06-04 NOTE — Progress Notes (Signed)
 OT Cancellation Note  Patient Details Name: Stephanie Carrillo MRN: [id number] DOB: [birth year]   Cancelled Treatment:    Reason Eval/Treat Not Completed: Other (comment) (Patient now on comfort care. OT sign off. Thank you for this referral.)  [facility] OT/L Acute Rehabilitation Department  [phone]  08-Jun-2024, 8:18 AM

## 2024-06-04 NOTE — Procedures (Signed)
 Patient Name: Stephanie Carrillo  MRN: [id number]  Epilepsy Attending: Arlin MALVA Krebs  Referring Physician/Provider: Dr Shanda Service Date: 06/04/24 Duration: 1 hour 35 mins  Patient history: 68yo F with seizure like activity. EEG to evaluate for seizure.  Level of alertness: comatose/ lethargic   AEDs during EEG study: None  Technical aspects: This EEG was obtained using a 10 lead EEG system positioned circumferentially without any parasagittal coverage (rapid EEG). Computer selected EEG is reviewed as  well as background features and all clinically significant events.  Description: EEG showed continuous generalized polymorphic sharply contoured 3 to 5 Hz theta-delta slowing. Hyperventilation and photic stimulation were not performed.     ABNORMALITY - Continuous slow, generalized  IMPRESSION: This limited ceribell EEG is suggestive of generalized cerebral dysfunction ( encephalopathy). No seizures or epileptiform discharges were seen throughout the recording.  If suspicion for interictal activity remains a concern, a video EEG with full montage can be considered.   Stephanie Carrillo

## 2024-06-04 NOTE — Consult Note (Signed)
 NEUROLOGY CONSULT NOTE   Date of service: 2024/06/02 Patient Name: Stephanie Carrillo MRN:  [id number] DOB:  February 27, 1956 Chief Complaint: Concern for seizure Requesting Provider: ALONSO Busman, MD, PCCM  History of Present Illness  Stephanie Carrillo is a 68 y.o. female with hx of hypertension, hyperlipidemia, recurrent UTIs, hypothyroidism, depression and anxiety along with chronic myelopathy both cervical and lumbar status post cervical and lumbar decompression complicated by CSF leak and meningitis in the past, chronic pain, presented to the evaluation for nausea vomiting and dysuria as well as altered mental status. Has a history of recurrent UTIs with multiple admissions for urosepsis.  Most recently seen at [facility] and admitted 9/24 to 05/01/2024 with sepsis due to MDR E. coli UTI.  Discharged home on oral nitrofurantoin .  At that time, noted to have strokelike symptoms of dysarthria with negative stroke workup. Presented to the ED on 05/14/2024 with vomiting, suprapubic pain and dysuria.  Also was having some word finding difficulty.  No chest pain.  Started treatment for UTI.   CT angio head and neck done unremarkable for acute process.  Today had 2 episodes concerning for seizure -First episode was called CODE BLUE-patient reportedly had a seizure-description not available, perhaps briefly lost a pulse and was agonal breathing.  However as the ED provider was setting up for intubation, she woke up and was able to speak and follow commands.   -She was moved to the stepdown/ICU where she had second concerning episode of apnea, hypotension but no witnessed seizure activity.  This lasted for about 3 to 5 minutes and she started to then come around by opening her eyes.  Rapid EEG was worked up.  Dr. Shelton was contacted, preliminary read not concerning for ongoing seizure activity.    Neurology called for formal consultation, for concern for seizures.  Patient could not tell me her prior history of  seizures and meningitis and the timeline associated with it.  No family at bedside at this time.   ROS  Unable to ascertain due to AMS  Past History   Past Medical History:  Diagnosis Date   Anxiety    Arthritis    Chronic kidney disease    Depression    GERD (gastroesophageal reflux disease)    Heart murmur    Hypertension    Insomnia    Meningitis    Pneumonia    Recurrent UTI    Seizures (HCC)    Substance abuse (HCC)     Past Surgical History:  Procedure Laterality Date   BACK SURGERY     CERVICAL DISC SURGERY     FRACTURE SURGERY N/A    Phreesia 05/07/2020   SPINE SURGERY N/A    Phreesia 05/07/2020    Family History: Family History  Problem Relation Age of Onset   Multiple sclerosis Mother    Cancer Mother        Possibly breast or colon mets to brain   Cancer Father    Lung cancer Father    Heart disease Father    Cancer Brother    Testicular cancer Brother    Prostate cancer Brother    Skin cancer Brother    Colon polyps Brother    Colon polyps Brother     Social History  reports that she has quit smoking. Her smoking use included cigarettes. She has a 30 pack-year smoking history. She has never used smokeless tobacco. She reports that she does not currently use alcohol. She reports that she does not  currently use drugs.  Allergies  Allergen Reactions   Ciprofloxacin Shortness Of Breath    Respiratory arrest   Penicillins Shortness Of Breath and Rash    Did it involve swelling of the face/tongue/throat, SOB, or low BP? y Did it involve sudden or severe rash/hives, skin peeling, or any reaction on the inside of your mouth or nose? y Did you need to seek medical attention at a hospital or doctor's office? n When did it last happen?  1985 If all above answers are NO, may proceed with cephalosporin use.  Tolerated Ceftriaxone  10/05/20 - 10/07/20   Oxycodone Other (See Comments)    Patient in recovery process.    Medications   Current  Facility-Administered Medications:    acetaminophen  (TYLENOL ) tablet 650 mg, 650 mg, Oral, Q6H PRN, 650 mg at 05/16/24 0834 **OR** acetaminophen  (TYLENOL ) suppository 650 mg, 650 mg, Rectal, Q6H PRN, Stephanie Jorie SAUNDERS, MD   ceFEPIme  (MAXIPIME ) 2 g in sodium chloride  0.9 % 100 mL IVPB, 2 g, Intravenous, Q8H, Carrillo, Ravi, MD, Stopped at 05/29/2024 0437   Chlorhexidine  Gluconate Cloth 2 % PADS 6 each, 6 each, Topical, Daily, Carrillo, Fredia, MD, 6 each at 05/16/24 2029   enoxaparin  (LOVENOX ) injection 40 mg, 40 mg, Subcutaneous, Q24H, Carrillo, Stephanie R, MD, 40 mg at 05/16/24 0834   etomidate (AMIDATE) 2 MG/ML injection, , , ,    feeding supplement (ENSURE PLUS HIGH PROTEIN) liquid 237 mL, 237 mL, Oral, BID BM, Carrillo, Ravi, MD   FLUoxetine  (PROZAC ) capsule 40 mg, 40 mg, Oral, Daily, Carrillo, Ravi, MD, 40 mg at 05/16/24 0834   hydrALAZINE  (APRESOLINE ) injection 10 mg, 10 mg, Intravenous, Q6H PRN, Stephanie, Abigail, NP, 10 mg at 05/15/24 1335   ibuprofen  (ADVIL ) tablet 400 mg, 400 mg, Oral, Q6H PRN, Carrillo, Vanessa, MD   levothyroxine (SYNTHROID) tablet 50 mcg, 50 mcg, Oral, Q0600, Carrillo, Stephanie R, MD, 50 mcg at 05/16/24 9351   LORazepam  (ATIVAN ) injection 1 mg, 1 mg, Intravenous, Q15 min PRN, Carrillo, Vanessa, MD   mupirocin ointment (BACTROBAN) 2 % 1 Application, 1 Application, Nasal, BID, Carrillo, Ravi, MD   norepinephrine (LEVOPHED) 4mg  in (0.016 mg/mL) premix infusion, 0-40 mcg/min, Intravenous, Titrated, Stephanie Lynwood POUR, NP, Last Rate: 41.3 mL/hr at 05/29/2024 0524, 11 mcg/min at 2024/05/29 0524   ondansetron  (ZOFRAN ) tablet 4 mg, 4 mg, Oral, Q6H PRN **OR** ondansetron  (ZOFRAN ) injection 4 mg, 4 mg, Intravenous, Q6H PRN, Stephanie Jorie R, MD, 4 mg at 05/16/24 9165   Oral care mouth rinse, 15 mL, Mouth Rinse, PRN, Stephanie Fredia, MD   oxyCODONE (Oxy IR/ROXICODONE) immediate release tablet 5 mg, 5 mg, Oral, Q6H PRN, Carrillo, Ravi, MD, 5 mg at 05/16/24 1842   pantoprazole  (PROTONIX ) EC tablet 40 mg, 40 mg, Oral,  Daily, Carrillo, Ravi, MD, 40 mg at 05/16/24 9165   rosuvastatin  (CRESTOR ) tablet 10 mg, 10 mg, Oral, QHS, Carrillo, Ravi, MD, 10 mg at 05/16/24 2232   senna-docusate (Senokot-S) tablet 1 tablet, 1 tablet, Oral, QHS PRN, Stephanie, Stephanie R, MD   sodium bicarbonate 150 mEq in dextrose  5 % 1,150 mL infusion, , Intravenous, Continuous, Mohammed, Shahid, MD, Last Rate: 75 mL/hr at 2024/05/29 0524, Infusion Verify at 05/29/2024 0524   sodium chloride  flush (NS) 0.9 % injection 10-40 mL, 10-40 mL, Intracatheter, PRN, Carrillo, Ravi, MD   sodium chloride  flush (NS) 0.9 % injection 3 mL, 3 mL, Intravenous, Q12H, Carrillo, Stephanie R, MD, 3 mL at 05/16/24 2227   SUMAtriptan (IMITREX) tablet 100 mg, 100 mg, Oral, Daily  PRN, Carrillo, Ravi, MD, 100 mg at 05/15/24 0002   tiZANidine  (ZANAFLEX ) tablet 4 mg, 4 mg, Oral, TID PRN, Carrillo, Ravi, MD, 4 mg at 05/15/24 2149   traZODone  (DESYREL ) tablet 150 mg, 150 mg, Oral, QHS PRN, Stephanie Ranks, MD   vitamin B-12 (CYANOCOBALAMIN ) tablet 100 mcg, 100 mcg, Oral, Daily, Carrillo, Ravi, MD, 100 mcg at May 18, 2024 0834  Vitals   Vitals:   2024-05-19 0200 2024-05-19 0215 05-19-2024 0230 05/19/24 0349  BP: (!) 97/48 (!) 103/45 (!) 102/51   Pulse: 81 79 81   Resp: (!) 27 15 (!) 24   Temp:    (!) 97.3 F (36.3 C)  TempSrc:    Axillary  SpO2: 96% 96% 97%   Weight:      Height:        Body mass index is 29.34 kg/m.   Physical Exam   General: Awake alert in no distress HEENT: Normocephalic atraumatic Lungs: Clear Cardiovascular: Regular rate rhythm Abdomen nondistended nontender Neurological exam She is awake alert She is able to tell me her name Her speech is dysarthric She could not tell me her age, or where she is or why she is in the hospital. Poor attention concentration Follows simple commands intermittently. Cranial nerves: Pupils are equal round react light, extraocular movements appear unhindered, visual fields appear full as evidenced by blink to threat, face appears  grossly symmetric. Motor examination: Some antigravity strength in bilateral upper extremities.  Not very cooperative with the exam but she does have minimal antigravity strength in the left lower extremity but can only hold her leg up for maybe a second.  She could not go antigravity on the right lower extremity. Sensory exam: Grimaces to noxious stimulation all over.  Also says that she is able to feel both touch and after stimulation equally on both sides DTRs: Difficult to elicit DTRs but she does have Hoffmann's on the left upper extremity and has nonsustained clonus up to 5-7 beats bilaterally in the ankles.  Meningitic signs: Kernig's and Brudzinski's negative  Labs/Imaging/Neurodiagnostic studies   CBC:  Recent Labs  Lab 05/25/2024 1150 05/23/2024 1549  WBC 28.6* 27.3*  NEUTROABS 24.9* 21.3*  HGB 7.6* 8.0*  HCT 21.1* 22.5*  MCV 99.1 98.7  PLT 244 278   Basic Metabolic Panel:  Lab Results  Component Value Date   NA 134 (L) 05/15/2024   K 3.6 05/15/2024   CO2 18 (L) 05/15/2024   GLUCOSE 89 05/15/2024   BUN 15 05/15/2024   CREATININE 0.68 05/15/2024   CALCIUM  9.0 05/15/2024   GFRNONAA >60 05/15/2024   GFRAA >60 02/24/2020   Lipid Panel:  Lab Results  Component Value Date   LDLCALC  10/26/2020     Comment:     . LDL cholesterol not calculated. Triglyceride levels greater than 400 mg/dL invalidate calculated LDL results. . Reference range: <100 . Desirable range <100 mg/dL for primary prevention;   <70 mg/dL for patients with CHD or diabetic patients  with > or = 2 CHD risk factors. SABRA LDL-C is now calculated using the Martin-Hopkins  calculation, which is a validated novel method providing  better accuracy than the Friedewald equation in the  estimation of LDL-C.  Gladis APPLETHWAITE et al. SANDREA. 7986;689(80): 2061-2068  (http://education.[employer].com/faq/FAQ164)    HgbA1c:  Lab Results  Component Value Date   HGBA1C 5.1 10/26/2020   Urine Drug Screen:      Component Value Date/Time   LABOPIA NONE DETECTED 08/01/2023 0049   COCAINSCRNUR NONE DETECTED  08/01/2023 0049   LABBENZ NONE DETECTED 08/01/2023 0049   AMPHETMU NONE DETECTED 08/01/2023 0049   THCU NONE DETECTED 08/01/2023 0049   LABBARB NONE DETECTED 08/01/2023 0049    Alcohol Level     Component Value Date/Time   ETH <10 03/12/2022 1940   INR  Lab Results  Component Value Date   INR 1.1 05/13/2024   APTT  Lab Results  Component Value Date   APTT 36 03/17/2021   Personally reviewed imaging CT head and CT angiography head and neck on admission: No acute intracranial abnormality.  No emergent large vessel occlusion or approximately significant stenosis.  Neurodiagnostics Rapid EEG-preliminary review negative for status epilepticus  ASSESSMENT   Antha Niday is a 68 y.o. female past history of hypertension hyperlipidemia, recurrent UTIs, hypothyroidism, depression anxiety, chronic myelopathy with both cervical and lumbar disease status post cervical and lumbar decompression complicated by CSF leak and meningitis in the past, chronic pain, admitted for complaints of nausea vomiting dysuria and also noted to have altered mental status and concern for seizure-like activity x 2 today. She is currently on rapid EEG, which did not show evidence of ongoing status epilepticus on preliminary review by the epileptologist She has had multiple episodes now concerning for seizure activity and has a history of seizures in the past. I do not see a record of her being on antiepileptic treatment at home. Due to the multiplicity of events, I do agree with further monitoring for which she would benefit from proper long-term EEG monitoring. I will hold off on antiepileptic medication for now with the hopes of spell capture and then start antiepileptics. Also, she is being treated for sepsis-presumably due to multiple etiologies-UTI, possible C. difficile antigen positive, indeterminate  results. If she has no clear source of infection, consider LP although she does not have any meningitic signs at this time but she has a history of CSF leak and meningitis in the past.  I will not start her on empiric meningitic coverage for now but I will change cefepime  to ceftriaxone .  In the interim continued medical management per primary team as you are  Impression:  Evaluate for seizures Low suspicion for meningitis but if no clear source identified, consider LP and meningitic antibiotic coverage  RECOMMENDATIONS  Maintain seizure precautions Telemetry Transfer to [facility] for LTM EEG Once arrives at [facility], will hook up to LTM EEG Hold off on antiseizure medication for now Use Ativan  if seizure greater than 5 minutes-also please call neurology at that time. Use alternative to cefepime -can cause neurotoxicity.  Spoke with pharmacist at [facility].  They will change cefepime  to ceftriaxone . Clinical exam has low suspicion for meningitis but if there is no clear source of her infection, consider LP and meningitic antibiotic coverage at that time. Neurology will review the LTM EEG and will follow once the patient is at [facility]. Plan was relayed to Dr. Kassie ______________________________________________________________________  Signed, Eligio Lav, MD [facility]

## 2024-06-04 NOTE — Procedures (Signed)
 Central Venous Catheter Insertion Procedure Note  Stephanie Carrillo  [id number]  [birth year]  Date:06-04-2024  Time:12:42 AM   Provider Performing:Anntonette Madewell   Procedure: Insertion of Non-tunneled Central Venous [phone]) with US  guidance ([id number])   Indication(s) Difficult access  Consent Risks of the procedure as well as the alternatives and risks of each were explained to the patient and/or caregiver.  Consent for the procedure was obtained and is signed in the bedside chart  Anesthesia Topical only with 1% lidocaine   Timeout Verified patient identification, verified procedure, site/side was marked, verified correct patient position, special equipment/implants available, medications/allergies/relevant history reviewed, required imaging and test results available.  Sterile Technique Maximal sterile technique including full sterile barrier drape, hand hygiene, sterile gown, sterile gloves, mask, hair covering, sterile ultrasound probe cover (if used).  Procedure Description Area of catheter insertion was cleaned with chlorhexidine  and draped in sterile fashion.  With real-time ultrasound guidance a central venous catheter was placed into the left internal jugular vein. Nonpulsatile blood flow and easy flushing noted in all ports.  The catheter was sutured in place and sterile dressing applied.  Complications/Tolerance None; patient tolerated the procedure well. Chest X-ray is ordered to verify placement for internal jugular or subclavian cannulation.   Chest x-ray is not ordered for femoral cannulation.  EBL Minimal  Specimen(s) None  POCUS IMAGE OF LEFT internal jugular GUIDEWIRE    Tamela Stakes, MD  Attending Physician, Critical Care Medicine [place] [facility] Critical [facility] See Amion for pager If no response to pager, please call [phone] until 7pm After 7pm, Please call E-link [phone]

## 2024-06-04 NOTE — Progress Notes (Signed)
 Patient passed away comfortably at 0807 in the company of RN. Patient was pronounced by Lorrayne Darner RN and Glade Piety RN. Patient's brother, Camellia Louder notified by RN. Family is traveling at this time from [place]  to visit her.

## 2024-06-04 NOTE — Progress Notes (Signed)
 eLink Physician-Brief Progress Note Patient Name: Stephanie Carrillo DOB: 1956-05-25 MRN: [id number]   Date of Service  2024/05/31  HPI/Events of Note  CXR reviewed with L internal jugular in good position.   CT head IMPRESSION: 1. No evidence of acute intracranial abnormality. 2. No large vessel occlusion or proximally significant stenosis. CT abdomen IMPRESSION: 1. No acute findings in the abdomen or pelvis. 2. Specifically, no findings to suggest acute pyelonephritis. 3. Possible mild right middle lobe pneumonia, equivocal. Associated post-infectious/inflammatory scarring.  eICU Interventions  Ok to use central line.  Continue empiric antibiotics.     Intervention Category Intermediate Interventions: Other:  Jeison Delpilar 05-31-2024, 3:11 AM

## 2024-06-04 NOTE — Death Summary Note (Signed)
 DEATH SUMMARY   Patient Details  Name: Stephanie Carrillo MRN: [id number] DOB: 1955-10-24  Admission/Discharge Information   Admit Date:  05-26-24  Date of Death: Date of Death: 05-30-24  Time of Death: Time of Death: 0807  Length of Stay: 3  Referring Physician: Hughie Sharper, MD    Diagnoses  Preliminary cause of death: Septic shock secondary to staphylococcus bacteremia x 5 days  Secondary Diagnoses (including complications and co-morbidities):  Principal Problem:   Severe sepsis with lactic acidosis (HCC) Active Problems:   Anxiety and depression   Chronic pain syndrome   Acute metabolic encephalopathy   Hypertension   Hypercalcemia   Hypothyroidism   Brief Hospital Course (including significant findings, care, treatment, and services provided and events leading to death)  Stephanie Carrillo is a 68 y.o. female with medical history significant for HTN, HLD, recurrent UTI, hypothyroidism, depression/anxiety, chronic myelopathy hx C5-7 ACDF, hx of lumbar decompression c/b CSF leak and meningitis, chronic pain, substance use who presented to the ED from home for evaluation of nausea and vomiting, dysuria.   Patient with a history of recurrent UTI with multiple admissions for urosepsis.  Most recently admitted at [facility] 9/24-9/28 with sepsis due to MDR E. coli UTI.  She was initially treated with IV cefepime .  Urine culture on 9/24 showed sensitivity to ceftriaxone  and nitrofurantoin .  She was discharged on oral nitrofurantoin .  It was noted that she had strokelike symptoms with dysarthria.  She had negative stroke workup and it was felt that the symptoms were secondary to sepsis/UTI.   Upon arrival to ED, she was hemodynamically stable.  Labs showed WBC 17.2, hemoglobin 15.0, platelets 440, sodium 136, potassium 4.4, bicarb 19, BUN 18, creatinine 0.78, serum glucose 202, albumin 5.1, calcium  11.1, LFTs within normal limits, lactic acid 3.4. Portable chest x-ray showed linear atelectasis  in the lung bases.  No focal consolidation, edema, effusion.CTA head/neck negative for acute intracranial abnormality, no LVO or approximately significant stenosis.Patient was given 2 L LR, IV ceftriaxone .  The hospitalist service was consulted for admission.   Patient treated with antibiotics and fluids.  Hospital course complicated by persistent diffuse pain, dropping H/H, rising WBC.  C diff results equivocal.  Lab results consistently unable to be processed due to contaminating particulates.  Overnight 10/13-10/14 had potential seizure episode and possible loss of pulses.  Spontaneously regained some degree of consciousness but in shock state so transferred to ICU. CT head and A/P with nothing actionable.  In ICU, she continued to decompensate, requiring escalating pressors, increased mottling, and agonal breathing pattern.  Review of prior admits showed she was a DNR and this was honored.  In light of rapidly worsening clinical status and suffering, she was allowed to pass peacefully.  Brother notified and is en route to hospital.  Pertinent Labs and Studies  Significant Diagnostic Studies CT HEAD WO CONTRAST ( ) Result Date: 05/30/2024 EXAM: CT HEAD WITHOUT CONTRAST May 30, 2024 06:30:13 AM TECHNIQUE: CT of the head was performed without the administration of intravenous contrast. Automated exposure control, iterative reconstruction, and/or weight based adjustment of the mA/kV was utilized to reduce the radiation dose to as low as reasonably achievable. COMPARISON: CT angiogram of the head dated May 26, 2024. CLINICAL HISTORY: Seizure, new-onset, no history of trauma. Seizure like activity, decrease LOC since ct a/p with contrast done at 0140 30-May-2024. FINDINGS: BRAIN AND VENTRICLES: No acute hemorrhage. No evidence of acute infarct. No hydrocephalus. No extra-axial collection. No mass effect or midline shift. There is age-related atrophy and mild periventricular  white matter disease. ORBITS: No  acute abnormality. SINUSES: No acute abnormality. SOFT TISSUES AND SKULL: No acute soft tissue abnormality. No skull fracture. IMPRESSION: 1. No acute intracranial abnormality. Electronically signed by: Evalene Coho MD 05/25/2024 06:41 AM EDT RP Workstation: [id number]   DG CHEST PORT 1 VIEW Result Date: May 25, 2024 EXAM: 1 VIEW XRAY OF THE CHEST 05-25-24 02:03:00 AM COMPARISON: 05/16/2024 CLINICAL HISTORY: Encounter for central line placement FINDINGS: LINES, TUBES AND DEVICES: Left internal jugular central venous catheter in place with tip overlying the upper right atrium. LUNGS AND PLEURA: Mild increased interstitial markings, suggesting mild interstitial edema. No pleural effusion. No pneumothorax. HEART AND MEDIASTINUM: No acute abnormality of the cardiac and mediastinal silhouettes. BONES AND SOFT TISSUES: Cervical spine surgical hardware noted. Defibrillator pads overlying the left hemithorax. IMPRESSION: 1. Left internal jugular central venous catheter with tip over the upper right atrium, appropriate positioning. 2. Possible mild interstitial pulmonary edema. Electronically signed by: Pinkie Pebbles MD 05/25/24 02:13 AM EDT RP Workstation: [id number]   CT ABDOMEN PELVIS W CONTRAST Result Date: May 25, 2024 EXAM: CT ABDOMEN AND PELVIS WITH CONTRAST May 25, 2024 01:35:43 AM TECHNIQUE: CT of the abdomen and pelvis was performed with the administration of 100 mL of iohexol  (OMNIPAQUE ) 300 MG/ML solution. Multiplanar reformatted images are provided for review. Automated exposure control, iterative reconstruction, and/or weight-based adjustment of the mA/kV was utilized to reduce the radiation dose to as low as reasonably achievable. COMPARISON: 08/01/2023 CLINICAL HISTORY: Pyelonephritis suspected, complicated history. Pyelonephritis suspected, abdominal pain, nausea, GFR>60, wbc's 27.3; 100 ml omni 300; 2 nd identifier was pt's RN Amaiyah. FINDINGS: LOWER CHEST: Suspected  post-infectious/inflammatory scarring inferiorly in the right upper lobe and bilateral lower lobes, although mild right middle lobe infection/pneumonia remains possible. LIVER: Geographic hepatic steatosis. GALLBLADDER AND BILE DUCTS: Gallbladder is unremarkable. No biliary ductal dilatation. SPLEEN: No acute abnormality. PANCREAS: No acute abnormality. ADRENAL GLANDS: No acute abnormality. KIDNEYS, URETERS AND BLADDER: No stones in the kidneys or ureters. No hydronephrosis. No perinephric or periureteral stranding. Urinary bladder is unremarkable. GI AND BOWEL: Stomach demonstrates no acute abnormality. There is no bowel obstruction. Normal appendix (image 64). PERITONEUM AND RETROPERITONEUM: Trace pelvic fluid, likely physiologic. No free air. VASCULATURE: Aorta is normal in caliber. LYMPH NODES: No lymphadenopathy. REPRODUCTIVE ORGANS: The uterus is unremarkable. BONES AND SOFT TISSUES: Degenerative changes of the visualized thoracolumbar spine. No focal soft tissue abnormality. IMPRESSION: 1. No acute findings in the abdomen or pelvis. 2. Specifically, no findings to suggest acute pyelonephritis. 3. Possible mild right middle lobe pneumonia, equivocal. Associated post-infectious/inflammatory scarring. Electronically signed by: Pinkie Pebbles MD 2024/05/25 01:43 AM EDT RP Workstation: [id number]   DG CHEST PORT 1 VIEW Result Date: 05/16/2024 CLINICAL DATA:  Respiratory compromise, sepsis EXAM: PORTABLE CHEST 1 VIEW COMPARISON:  05/13/2024 FINDINGS: The heart size and mediastinal contours are within normal limits. Both lungs are clear. The visualized skeletal structures are unremarkable. IMPRESSION: No active disease. Electronically Signed   By: Franky Crease M.D.   On: 05/16/2024 20:27   DG Abd 1 View Result Date: 05/14/2024 EXAM: 1 VIEW XRAY OF THE ABDOMEN 05/14/2024 07:12:00 AM COMPARISON: 04/09/2023 CLINICAL HISTORY: Pt came home from [facility] 3 days ago after being treated for a UTI. Today she began  vomiting around 3am and it has persisted all day. She has not been able to take her meds due to continuous vomiting. FINDINGS: LINES, TUBES AND DEVICES: EKG leads in left upper quadrant. BOWEL: Moderate stool burden in ascending colon. Nonobstructive bowel gas pattern. SOFT TISSUES: Distended urinary bladder  with excreted contrast. No opaque urinary calculi. BONES: Dextrocurvature of lumbar spine with associated degenerative changes. No acute osseous abnormality. IMPRESSION: 1. No acute findings. 2. Moderate stool burden in the ascending colon. Electronically signed by: Waddell Calk MD 05/14/2024 07:21 AM EDT RP Workstation: [id number]   CT ANGIO HEAD NECK W WO CM Result Date: 05/13/2024 EXAM: CTA HEAD AND NECK WITHOUT AND WITH 05/13/2024 11:08:46 PM TECHNIQUE: CTA of the head and neck was performed without and with the administration of 75mL iohexol  (OMNIPAQUE ) 350 MG/ML injection 75 mL IOHEXOL  350 MG/ML SOLN intravenous contrast. Multiplanar 2D and/or 3D reformatted images are provided for review. Automated exposure control, iterative reconstruction, and/or weight based adjustment of the mA/kV was utilized to reduce the radiation dose to as low as reasonably achievable. Stenosis of the internal carotid arteries measured using NASCET criteria. COMPARISON: None available CLINICAL HISTORY: Neuro deficit, acute, stroke suspected. Per triage note: Pt came home from [facility] 3 days ago after being treated for a UTI. Today she began vomiting around 3am and it has persisted all day. She has not been able to take her meds due to continuous vomiting. She denies abdominal pain, fever, or urinary symptoms. FINDINGS: AORTIC ARCH AND ARCH VESSELS: No dissection or arterial injury. No significant stenosis of the brachiocephalic or subclavian arteries. CERVICAL CAROTID ARTERIES: No dissection, arterial injury, or hemodynamically significant stenosis by NASCET criteria. CERVICAL VERTEBRAL ARTERIES: No dissection, arterial injury,  or significant stenosis.  Left dominant. LUNGS AND MEDIASTINUM: Unremarkable. SOFT TISSUES: No acute abnormality. BONES: No acute abnormality. 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 SEVERE SPINAL CANAL STENOSIS. ANTERIOR CIRCULATION: No significant stenosis of the internal carotid arteries. No significant stenosis of the anterior cerebral arteries. No significant stenosis of the middle cerebral arteries. No aneurysm. POSTERIOR CIRCULATION: No significant stenosis of the posterior cerebral arteries. No significant stenosis of the basilar artery. No significant stenosis of the vertebral arteries. No aneurysm. OTHER: No dural venous sinus thrombosis on this non-dedicated study. IMPRESSION: 1. No evidence of acute intracranial abnormality. 2. No large vessel occlusion or proximally significant stenosis. Electronically signed by: Gilmore Molt MD 05/13/2024 11:33 PM EDT RP Workstation: [id number]   DG Chest Port 1 View Result Date: 05/13/2024 CLINICAL DATA:  Question of sepsis to evaluate for abnormality. Patient was treated for urinary tract infection at [facility] 3 days ago. Today vomiting. Abdominal pain. EXAM: PORTABLE CHEST 1 VIEW COMPARISON:  April 08, 2023 FINDINGS: Heart size and pulmonary vascularity are normal for technique. Linear atelectasis in the lung bases. No airspace disease or consolidation. No pleural effusion or pneumothorax. Mediastinal contours appear intact. Postoperative changes in the cervical spine. Degenerative changes in the thoracic spine and shoulders. IMPRESSION: Linear atelectasis in the lung bases.  No focal consolidation. Electronically Signed   By: Elsie Gravely M.D.   On: 05/13/2024 22:31    Microbiology Recent Results (from the past 240 hours)  Blood culture (routine x 2)     Status: Abnormal   Collection Time: 05/13/24  8:59 PM   Specimen: BLOOD  Result Value Ref Range Status   Specimen Description   Final     BLOOD LEFT ANTECUBITAL Performed at [facility], [address] [place], [place] [address]    Special Requests   Final    BOTTLES DRAWN AEROBIC AND ANAEROBIC Blood Culture results may not be optimal due to an inadequate volume of blood received in culture bottles Performed at [facility],  [address], [place], [place] [address]    Culture  Setup Time   Final    GRAM POSITIVE COCCI IN CLUSTERS IN BOTH AEROBIC AND ANAEROBIC BOTTLES CRITICAL RESULT CALLED TO, READ BACK BY AND VERIFIED WITH: PHARMD CRYSTAL ROBERTSON [id number] AT 1823 BY EC    Culture (A)  Final    STAPHYLOCOCCUS EPIDERMIDIS STAPHYLOCOCCUS HOMINIS THE SIGNIFICANCE OF ISOLATING THIS ORGANISM FROM A SINGLE SET OF BLOOD CULTURES WHEN MULTIPLE SETS ARE DRAWN IS UNCERTAIN. PLEASE NOTIFY THE MICROBIOLOGY DEPARTMENT WITHIN ONE WEEK IF SPECIATION AND SENSITIVITIES ARE REQUIRED. Performed at [facility], [address] [place], [place] [address]    Report Status 05/16/2024 FINAL  Final  Blood Culture ID Panel (Reflexed)     Status: Abnormal   Collection Time: 05/13/24  8:59 PM  Result Value Ref Range Status   Enterococcus faecalis NOT DETECTED NOT DETECTED Final   Enterococcus Faecium NOT DETECTED NOT DETECTED Final   Listeria monocytogenes NOT DETECTED NOT DETECTED Final   Staphylococcus species DETECTED (A) NOT DETECTED Final    Comment: CRITICAL RESULT CALLED TO, READ BACK BY AND VERIFIED WITH: PHARMD CRYSTAL ROBERTSON [id number] AT 1823 BY EC    Staphylococcus aureus (BCID) NOT DETECTED NOT DETECTED Final   Staphylococcus epidermidis DETECTED (A) NOT DETECTED Final    Comment: Methicillin (oxacillin) resistant coagulase negative staphylococcus. Possible blood culture contaminant (unless isolated from more than one blood culture draw or clinical case suggests pathogenicity). No antibiotic treatment is indicated for blood  culture contaminants. CRITICAL RESULT CALLED TO, READ  BACK BY AND VERIFIED WITH: PHARMD CRYSTAL ROBERTSON [id number] AT 1823 BY EC    Staphylococcus lugdunensis NOT DETECTED NOT DETECTED Final   Streptococcus species NOT DETECTED NOT DETECTED Final   Streptococcus agalactiae NOT DETECTED NOT DETECTED Final   Streptococcus pneumoniae NOT DETECTED NOT DETECTED Final   Streptococcus pyogenes NOT DETECTED NOT DETECTED Final   A.calcoaceticus-baumannii NOT DETECTED NOT DETECTED Final   Bacteroides fragilis NOT DETECTED NOT DETECTED Final   Enterobacterales NOT DETECTED NOT DETECTED Final   Enterobacter cloacae complex NOT DETECTED NOT DETECTED Final   Escherichia coli NOT DETECTED NOT DETECTED Final   Klebsiella aerogenes NOT DETECTED NOT DETECTED Final   Klebsiella oxytoca NOT DETECTED NOT DETECTED Final   Klebsiella pneumoniae NOT DETECTED NOT DETECTED Final   Proteus species NOT DETECTED NOT DETECTED Final   Salmonella species NOT DETECTED NOT DETECTED Final   Serratia marcescens NOT DETECTED NOT DETECTED Final   Haemophilus influenzae NOT DETECTED NOT DETECTED Final   Neisseria meningitidis NOT DETECTED NOT DETECTED Final   Pseudomonas aeruginosa NOT DETECTED NOT DETECTED Final   Stenotrophomonas maltophilia NOT DETECTED NOT DETECTED Final   Candida albicans NOT DETECTED NOT DETECTED Final   Candida auris NOT DETECTED NOT DETECTED Final   Candida glabrata NOT DETECTED NOT DETECTED Final   Candida krusei NOT DETECTED NOT DETECTED Final   Candida parapsilosis NOT DETECTED NOT DETECTED Final   Candida tropicalis NOT DETECTED NOT DETECTED Final   Cryptococcus neoformans/gattii NOT DETECTED NOT DETECTED Final   Methicillin resistance mecA/C DETECTED (A) NOT DETECTED Final    Comment: CRITICAL RESULT CALLED TO, READ BACK BY AND VERIFIED WITH: [facility] CRYSTAL ROBERTSON [id number] AT 1823 BY EC Performed at [facility], [address], [place], [place] [address]   Resp panel by RT-PCR (RSV, Flu A&B, Covid) Anterior Nasal Swab     Status:  None   Collection Time: 05/14/24  1:50 AM   Specimen: Anterior  Nasal Swab  Result Value Ref Range Status   SARS Coronavirus 2 by RT PCR NEGATIVE NEGATIVE Final    Comment: (NOTE) SARS-CoV-2 target nucleic acids are NOT DETECTED.  The SARS-CoV-2 RNA is generally detectable in upper respiratory specimens during the acute phase of infection. The lowest concentration of SARS-CoV-2 viral copies this assay can detect is 138 copies/mL. A negative result does not preclude SARS-Cov-2 infection and should not be used as the sole basis for treatment or other patient management decisions. A negative result may occur with  improper specimen collection/handling, submission of specimen other than nasopharyngeal swab, presence of viral mutation(s) within the areas targeted by this assay, and inadequate number of viral copies(<138 copies/mL). A negative result must be combined with clinical observations, patient history, and epidemiological information. The expected result is Negative.  Fact Sheet for Patients:  BloggerCourse.com  Fact Sheet for Healthcare Providers:  SeriousBroker.it  This test is no t yet approved or cleared by the [place]  FDA and  has been authorized for detection and/or diagnosis of SARS-CoV-2 by FDA under an Emergency Use Authorization (EUA). This EUA will remain  in effect (meaning this test can be used) for the duration of the COVID-19 declaration under Section 564(b)(1) of the Act, 21 U.S.C.section 360bbb-3(b)(1), unless the authorization is terminated  or revoked sooner.       Influenza A by PCR NEGATIVE NEGATIVE Final   Influenza B by PCR NEGATIVE NEGATIVE Final    Comment: (NOTE) The Xpert Xpress SARS-CoV-2/FLU/RSV plus assay is intended as an aid in the diagnosis of influenza from Nasopharyngeal swab specimens and should not be used as a sole basis for treatment. Nasal washings and aspirates are unacceptable  for Xpert Xpress SARS-CoV-2/FLU/RSV testing.  Fact Sheet for Patients: BloggerCourse.com  Fact Sheet for Healthcare Providers: SeriousBroker.it  This test is not yet approved or cleared by the [place]  FDA and has been authorized for detection and/or diagnosis of SARS-CoV-2 by FDA under an Emergency Use Authorization (EUA). This EUA will remain in effect (meaning this test can be used) for the duration of the COVID-19 declaration under Section 564(b)(1) of the Act, 21 U.S.C. section 360bbb-3(b)(1), unless the authorization is terminated or revoked.     Resp Syncytial Virus by PCR NEGATIVE NEGATIVE Final    Comment: (NOTE) Fact Sheet for Patients: BloggerCourse.com  Fact Sheet for Healthcare Providers: SeriousBroker.it  This test is not yet approved or cleared by the [place]  FDA and has been authorized for detection and/or diagnosis of SARS-CoV-2 by FDA under an Emergency Use Authorization (EUA). This EUA will remain in effect (meaning this test can be used) for the duration of the COVID-19 declaration under Section 564(b)(1) of the Act, 21 U.S.C. section 360bbb-3(b)(1), unless the authorization is terminated or revoked.  Performed at [facility], [address], [place], [place] [address]   Blood culture (routine x 2)     Status: None (Preliminary result)   Collection Time: 05/14/24  7:19 PM   Specimen: BLOOD RIGHT HAND  Result Value Ref Range Status   Specimen Description BLOOD RIGHT HAND  Final   Special Requests   Final    BOTTLES DRAWN AEROBIC ONLY Blood Culture results may not be optimal due to an inadequate volume of blood received in culture bottles   Culture   Final    NO GROWTH 2 DAYS Performed at [facility], [address], [place], [place] [address]    Report Status PENDING  Incomplete  C Difficile Quick Screen (NO PCR  Reflex)     Status: Abnormal   Collection Time: 05/16/24  1:31 PM   Specimen: STOOL  Result Value Ref Range Status   C Diff antigen POSITIVE (A) NEGATIVE Final   C Diff toxin NEGATIVE NEGATIVE Final   C Diff interpretation   Final    Results are indeterminate. Please contact the provider listed for your campus for C diff questions in AMION.    Comment: Performed at [facility], [address] [place], [place] [address]  MRSA Next Gen by PCR, Nasal     Status: Abnormal   Collection Time: 05/16/24  8:05 PM   Specimen: Nasal Mucosa; Nasal Swab  Result Value Ref Range Status   MRSA by PCR Next Gen DETECTED (A) NOT DETECTED Final    Comment: (NOTE) The GeneXpert MRSA Assay (FDA approved for NASAL specimens only), is one component of a comprehensive MRSA colonization surveillance program. It is not intended to diagnose MRSA infection nor to guide or monitor treatment for MRSA infections. Test performance is not FDA approved in patients less than 89 years old. Performed at [facility], [address] [place], [place] [address]     Lab Basic Metabolic Panel: Recent Labs  Lab 05/13/24 2040 05/14/24 0533 05/15/24 0512  NA 136 136 134*  K 4.4 4.0 3.6  CL 99 99 98  CO2 19* 23 18*  GLUCOSE 202* 140* 89  BUN 18 15 15   CREATININE 0.78 0.76 0.68  CALCIUM  11.1* 10.0 9.0   Liver Function Tests: Recent Labs  Lab 05/13/24 2040  AST 31  ALT 14  ALKPHOS 118  BILITOT 0.8  PROT 9.6*  ALBUMIN 5.1*   Recent Labs  Lab 05/14/24 1722  LIPASE 51   No results for input(s): AMMONIA in the last 168 hours. CBC: Recent Labs  Lab 05/13/24 2040 05/14/24 0533 05/15/24 0512 05/16/24 1150 05/16/24 1549  WBC 17.2* 23.2* 16.1* 28.6* 27.3*  NEUTROABS 15.9*  --  14.0* 24.9* 21.3*  HGB 15.0 12.9 12.2 7.6* 8.0*  HCT 45.2 39.9 34.9* 21.1* 22.5*  MCV 100.4* 105.0* 98.6 99.1 98.7  PLT 440* 356 364 244 278   Cardiac Enzymes: No results for input(s):  CKTOTAL, CKMB, CKMBINDEX, TROPONINI in the last 168 hours. Sepsis Labs: Recent Labs  Lab 05/13/24 2050 05/14/24 0533 05/14/24 0920 05/15/24 0512 05/16/24 1150 05/16/24 1549  WBC  --  23.2*  --  16.1* 28.6* 27.3*  LATICACIDVEN 3.4*  --  1.5  --   --   --      Toribio JAYSON Sharps 05/30/2024, 8:22 AM

## 2024-06-04 NOTE — Progress Notes (Addendum)
 06-12-24  Seen in f/u for sepsis  S: Called on arrival to unit to patient's room. She is mottled, agonally breathing, mumbling responses and appears to be actively dying. I note DNR on every prior admit except this one. Called both brothers to voicemail.  O:    12-Jun-2024    7:00 AM 06-12-24    6:23 AM 12-Jun-2024    6:18 AM  Vitals with BMI  Systolic 106 118 84  Diastolic 37 72 17  Pulse 77 76   Jaundiced Diffuse petechial rash on arms and legs Mottled cool ext Mumbles to stimulation Agonal respirations Thready pulses on high dose levophed  All labs are unable to be run due to some contaminating substance/ ?particulate.  No BMP in 2 days for reason above  A: Overwhelming septic shock with multiorgan failure and impending cardiopulmonary collapse Multiple prior DNRs well documented in system Regarding seizures, this is likely from low flow state that she has been in throughout night  P: She is actively dying, we have tried our best to reach family. Do not think we are honoring her DNR both documented on multiple prior admits at [facility] and in a 2022 document from [facility]; she was not oriented during this admission and do not see any discussion regarding this. For this reason and impending cardiac arrest, apparent suffering, will give meds to make her comfortable in her final hours and continue to try to reach family  41 min cc time  Rolan Sharps MD Pulmonary Critical Care Medicine Securechat if during day (7a-7p) [phone] if after hours (7p-7a)

## 2024-06-04 NOTE — Progress Notes (Signed)
 Reviewed overnight events.  Patient has now been transitioned to full comfort care.  Appreciate PCCM taking over the care of this patient.

## 2024-06-04 NOTE — Progress Notes (Signed)
 After receiving report, RN entered room to find patient's BP had dropped into the 60's on 40 mcg levophed. Patient was noted to be unresponsive with a pulse. Dr Claudene notified and immediately assessed patient at bedside. Patient's brother Camellia was called by CCM and made aware of his sister's severe decline. The decision was made to transition to comfort care while he is on his way from out of town.  Once Fentanyl drip was initiated, pressors were turned off per orders. Patient is resting comfortably at this time. RN will continue to monitor patient closely.

## 2024-06-04 NOTE — Progress Notes (Addendum)
 eLink Physician-Brief Progress Note Patient Name: Stephanie Carrillo DOB: 25-Aug-1955 MRN: [id number]   Date of Service  05/24/24  HPI/Events of Note  Notified that patient again became apneic, not responding to noxious stimuli.  She became hypotensive.  No seizure like activity.  This lasted for about 3-48minutes.  She then came to it and is now opening her eyes and responding appropriately.    eICU Interventions  Apply ceribell.  Consult neurology.  Get CBC, CMP, lactic acid and ABG now.      Intervention Category Major Interventions: Change in mental status - evaluation and management  Shanda Busman May 24, 2024, 4:00 AM  4:55 AM Dr. Shelton reached out to notify us  that there were no seizures on ceribell.   Dr. Arora was called for consult.   RN notified me of dark, brownish red urine.   Plan> Insert temp foley.  Keep ceribell in place for 2-3 hours.  Send sample for urinalysis.

## 2024-06-04 NOTE — Progress Notes (Signed)
 Patient belongings sent with patient's brother Camellia Louder. Patient placement information provided. See flowsheets for further details.

## 2024-06-04 NOTE — Progress Notes (Signed)
 Patient having another seizure-like episode. PCCM notified. Per CCM via Neuro recommendation. Seizure not lasting for more than 5 minutes. No new orders at this time.   Patient came out of said state and oriented to self and location. Patient verbalized need for use of the bathroom. In and out cath performed with minimal dark urine. Foley placed Per MD.  Collected labs via left internal jugular site twice. I was notified by lab that they were not able to be processed due to sediment like blood. CCM notified. Will continue to monitor.  23-May-2024  0545am Signed Bertram MARLA Merles

## 2024-06-04 DEATH — deceased
